# Patient Record
Sex: Female | Born: 1935 | Race: White | Hispanic: No | Smoking: Former smoker
Health system: Southern US, Community
[De-identification: ages and names within clinical notes are randomized; demographics above are authoritative.]

## PROBLEM LIST (undated history)

## (undated) DIAGNOSIS — C50919 Malignant neoplasm of unspecified site of unspecified female breast: Secondary | ICD-10-CM

## (undated) DIAGNOSIS — R51 Headache: Secondary | ICD-10-CM

## (undated) DIAGNOSIS — I10 Essential (primary) hypertension: Secondary | ICD-10-CM

## (undated) DIAGNOSIS — F329 Major depressive disorder, single episode, unspecified: Secondary | ICD-10-CM

## (undated) DIAGNOSIS — F419 Anxiety disorder, unspecified: Secondary | ICD-10-CM

## (undated) DIAGNOSIS — Z853 Personal history of malignant neoplasm of breast: Secondary | ICD-10-CM

## (undated) DIAGNOSIS — Z85038 Personal history of other malignant neoplasm of large intestine: Secondary | ICD-10-CM

## (undated) DIAGNOSIS — E669 Obesity, unspecified: Secondary | ICD-10-CM

## (undated) DIAGNOSIS — N6019 Diffuse cystic mastopathy of unspecified breast: Secondary | ICD-10-CM

## (undated) DIAGNOSIS — C189 Malignant neoplasm of colon, unspecified: Secondary | ICD-10-CM

## (undated) DIAGNOSIS — D490 Neoplasm of unspecified behavior of digestive system: Secondary | ICD-10-CM

## (undated) DIAGNOSIS — M1990 Unspecified osteoarthritis, unspecified site: Secondary | ICD-10-CM

## (undated) DIAGNOSIS — G8929 Other chronic pain: Secondary | ICD-10-CM

## (undated) DIAGNOSIS — E785 Hyperlipidemia, unspecified: Secondary | ICD-10-CM

## (undated) DIAGNOSIS — R519 Other chronic pain: Secondary | ICD-10-CM

## (undated) DIAGNOSIS — G4730 Sleep apnea, unspecified: Secondary | ICD-10-CM

## (undated) DIAGNOSIS — F32A Depression, unspecified: Secondary | ICD-10-CM

## (undated) DIAGNOSIS — I509 Heart failure, unspecified: Secondary | ICD-10-CM

## (undated) DIAGNOSIS — R079 Chest pain, unspecified: Secondary | ICD-10-CM

## (undated) DIAGNOSIS — J449 Chronic obstructive pulmonary disease, unspecified: Secondary | ICD-10-CM

## (undated) HISTORY — DX: Personal history of other malignant neoplasm of large intestine: Z85.038

## (undated) HISTORY — PX: COLOSTOMY: SHX63

## (undated) HISTORY — DX: Malignant neoplasm of colon, unspecified: C18.9

## (undated) HISTORY — DX: Headache: R51

## (undated) HISTORY — PX: APPENDECTOMY: SHX54

## (undated) HISTORY — DX: Malignant neoplasm of unspecified site of unspecified female breast: C50.919

## (undated) HISTORY — DX: Major depressive disorder, single episode, unspecified: F32.9

## (undated) HISTORY — DX: Depression, unspecified: F32.A

## (undated) HISTORY — DX: Other chronic pain: G89.29

## (undated) HISTORY — DX: Hyperlipidemia, unspecified: E78.5

## (undated) HISTORY — DX: Essential (primary) hypertension: I10

## (undated) HISTORY — PX: DILATION AND CURETTAGE, DIAGNOSTIC / THERAPEUTIC: SUR384

## (undated) HISTORY — DX: Unspecified osteoarthritis, unspecified site: M19.90

## (undated) HISTORY — DX: Anxiety disorder, unspecified: F41.9

## (undated) HISTORY — PX: CATARACT EXTRACTION W/ INTRAOCULAR LENS IMPLANT & ANTERIOR VITRECTOMY, BILATERAL: SHX1310

## (undated) HISTORY — DX: Sleep apnea, unspecified: G47.30

## (undated) HISTORY — DX: Chronic obstructive pulmonary disease, unspecified: J44.9

## (undated) HISTORY — DX: Neoplasm of unspecified behavior of digestive system: D49.0

## (undated) HISTORY — DX: Other chronic pain: R51.9

## (undated) HISTORY — PX: OTHER SURGICAL HISTORY: SHX169

## (undated) HISTORY — DX: Chest pain, unspecified: R07.9

## (undated) HISTORY — PX: BUNIONECTOMY: SHX129

## (undated) HISTORY — DX: Personal history of malignant neoplasm of breast: Z85.3

## (undated) HISTORY — PX: MASTECTOMY PARTIAL / LUMPECTOMY: SUR851

## (undated) HISTORY — PX: PARTIAL COLECTOMY: SHX5273

## (undated) HISTORY — DX: Diffuse cystic mastopathy of unspecified breast: N60.19

## (undated) HISTORY — PX: TONSILLECTOMY: SUR1361

## (undated) HISTORY — DX: Obesity, unspecified: E66.9

---

## 1969-01-04 HISTORY — PX: TUBAL LIGATION: SHX77

## 1991-01-05 HISTORY — PX: COLON SURGERY: SHX602

## 2003-11-19 ENCOUNTER — Ambulatory Visit: Payer: Self-pay | Admitting: Gastroenterology

## 2004-09-02 ENCOUNTER — Ambulatory Visit: Payer: Self-pay | Admitting: Chiropractic Medicine

## 2004-12-17 ENCOUNTER — Ambulatory Visit: Payer: Self-pay | Admitting: Family Medicine

## 2004-12-29 ENCOUNTER — Ambulatory Visit: Payer: Self-pay | Admitting: Family Medicine

## 2005-01-04 DIAGNOSIS — C50919 Malignant neoplasm of unspecified site of unspecified female breast: Secondary | ICD-10-CM

## 2005-01-04 HISTORY — PX: BREAST LUMPECTOMY: SHX2

## 2005-01-04 HISTORY — DX: Malignant neoplasm of unspecified site of unspecified female breast: C50.919

## 2005-02-08 ENCOUNTER — Ambulatory Visit: Payer: Self-pay | Admitting: Radiation Oncology

## 2005-02-17 ENCOUNTER — Ambulatory Visit: Payer: Self-pay | Admitting: General Surgery

## 2005-02-17 ENCOUNTER — Other Ambulatory Visit: Payer: Self-pay

## 2005-02-24 ENCOUNTER — Ambulatory Visit: Payer: Self-pay | Admitting: General Surgery

## 2005-03-15 ENCOUNTER — Ambulatory Visit: Payer: Self-pay | Admitting: Oncology

## 2005-04-04 ENCOUNTER — Ambulatory Visit: Payer: Self-pay | Admitting: Oncology

## 2005-05-04 ENCOUNTER — Ambulatory Visit: Payer: Self-pay | Admitting: Oncology

## 2005-06-04 ENCOUNTER — Ambulatory Visit: Payer: Self-pay | Admitting: Oncology

## 2005-09-17 ENCOUNTER — Ambulatory Visit: Payer: Self-pay | Admitting: Oncology

## 2005-11-22 ENCOUNTER — Ambulatory Visit: Payer: Self-pay | Admitting: Radiation Oncology

## 2005-12-13 ENCOUNTER — Ambulatory Visit: Payer: Self-pay | Admitting: General Surgery

## 2006-03-16 ENCOUNTER — Ambulatory Visit: Payer: Self-pay | Admitting: Oncology

## 2006-04-05 ENCOUNTER — Ambulatory Visit: Payer: Self-pay | Admitting: Oncology

## 2006-04-22 ENCOUNTER — Emergency Department: Payer: Self-pay | Admitting: Emergency Medicine

## 2006-06-13 ENCOUNTER — Ambulatory Visit: Payer: Self-pay | Admitting: General Surgery

## 2006-06-20 ENCOUNTER — Ambulatory Visit: Payer: Self-pay | Admitting: Radiation Oncology

## 2006-06-20 ENCOUNTER — Ambulatory Visit: Payer: Self-pay | Admitting: Oncology

## 2006-07-05 ENCOUNTER — Ambulatory Visit: Payer: Self-pay | Admitting: Radiation Oncology

## 2006-10-05 ENCOUNTER — Ambulatory Visit: Payer: Self-pay | Admitting: Oncology

## 2006-10-19 ENCOUNTER — Ambulatory Visit: Payer: Self-pay | Admitting: Oncology

## 2006-11-05 ENCOUNTER — Ambulatory Visit: Payer: Self-pay | Admitting: Oncology

## 2007-01-23 ENCOUNTER — Ambulatory Visit: Payer: Self-pay | Admitting: General Surgery

## 2007-02-03 ENCOUNTER — Ambulatory Visit: Payer: Self-pay | Admitting: General Surgery

## 2007-04-05 ENCOUNTER — Ambulatory Visit: Payer: Self-pay | Admitting: Oncology

## 2007-04-19 ENCOUNTER — Ambulatory Visit: Payer: Self-pay | Admitting: Oncology

## 2007-05-05 ENCOUNTER — Ambulatory Visit: Payer: Self-pay | Admitting: Oncology

## 2007-05-17 ENCOUNTER — Ambulatory Visit: Payer: Self-pay | Admitting: Unknown Physician Specialty

## 2007-06-05 ENCOUNTER — Ambulatory Visit: Payer: Self-pay | Admitting: Radiation Oncology

## 2007-06-07 ENCOUNTER — Ambulatory Visit: Payer: Self-pay | Admitting: Gastroenterology

## 2007-06-16 ENCOUNTER — Other Ambulatory Visit: Payer: Self-pay

## 2007-06-16 ENCOUNTER — Emergency Department: Payer: Self-pay | Admitting: Emergency Medicine

## 2007-07-24 ENCOUNTER — Ambulatory Visit: Payer: Self-pay | Admitting: General Surgery

## 2007-07-27 ENCOUNTER — Ambulatory Visit: Payer: Self-pay | Admitting: General Surgery

## 2007-10-05 ENCOUNTER — Ambulatory Visit: Payer: Self-pay | Admitting: Oncology

## 2007-11-01 ENCOUNTER — Ambulatory Visit: Payer: Self-pay | Admitting: Oncology

## 2007-11-05 ENCOUNTER — Ambulatory Visit: Payer: Self-pay | Admitting: Oncology

## 2008-01-10 ENCOUNTER — Ambulatory Visit: Payer: Self-pay | Admitting: General Surgery

## 2008-09-30 ENCOUNTER — Ambulatory Visit: Payer: Self-pay | Admitting: Unknown Physician Specialty

## 2009-01-04 ENCOUNTER — Ambulatory Visit: Payer: Self-pay | Admitting: Oncology

## 2009-01-21 ENCOUNTER — Ambulatory Visit: Payer: Self-pay | Admitting: Oncology

## 2009-02-04 ENCOUNTER — Ambulatory Visit: Payer: Self-pay | Admitting: Oncology

## 2009-02-11 ENCOUNTER — Ambulatory Visit: Payer: Self-pay | Admitting: Gynecologic Oncology

## 2009-02-12 ENCOUNTER — Ambulatory Visit: Payer: Self-pay | Admitting: General Surgery

## 2009-03-04 ENCOUNTER — Ambulatory Visit: Payer: Self-pay | Admitting: Oncology

## 2009-03-11 ENCOUNTER — Ambulatory Visit: Payer: Self-pay | Admitting: Gynecologic Oncology

## 2009-03-25 ENCOUNTER — Ambulatory Visit: Payer: Self-pay | Admitting: Oncology

## 2009-04-04 ENCOUNTER — Ambulatory Visit: Payer: Self-pay | Admitting: Oncology

## 2009-09-04 ENCOUNTER — Ambulatory Visit: Payer: Self-pay | Admitting: Oncology

## 2009-10-04 ENCOUNTER — Ambulatory Visit: Payer: Self-pay | Admitting: Oncology

## 2010-06-29 ENCOUNTER — Ambulatory Visit: Payer: Self-pay | Admitting: Family Medicine

## 2010-12-10 LAB — PULMONARY FUNCTION TEST

## 2011-01-07 ENCOUNTER — Observation Stay: Payer: Self-pay | Admitting: Internal Medicine

## 2011-01-07 LAB — BASIC METABOLIC PANEL
Anion Gap: 11 (ref 7–16)
BUN: 12 mg/dL (ref 7–18)
Chloride: 97 mmol/L — ABNORMAL LOW (ref 98–107)
EGFR (Non-African Amer.): >60
Glucose: 52 mg/dL — ABNORMAL LOW (ref 65–99)
Osmolality: 275 (ref 275–301)

## 2011-01-07 LAB — CBC
HGB: 12.2 g/dL (ref 12.0–16.0)
MCH: 31.3 pg (ref 26.0–34.0)
MCV: 95 fL (ref 80–100)
Platelet: 244 10*3/uL (ref 150–440)
RBC: 3.9 10*6/uL (ref 3.80–5.20)
WBC: 6.4 10*3/uL (ref 3.6–11.0)

## 2011-01-07 LAB — PRO B NATRIURETIC PEPTIDE: B-Type Natriuretic Peptide: 878 pg/mL — ABNORMAL HIGH (ref 0–125)

## 2011-01-07 LAB — TROPONIN I: Troponin-I: <0.02 ng/mL

## 2011-01-07 LAB — CK TOTAL AND CKMB (NOT AT ARMC)
CK, Total: 52 U/L (ref 21–215)
CK-MB: 0.6 ng/mL (ref 0.5–3.6)

## 2011-01-08 LAB — URINALYSIS, COMPLETE
Bacteria: NONE SEEN
Bilirubin,UR: NEGATIVE
Blood: NEGATIVE
Glucose,UR: NEGATIVE mg/dL (ref 0–75)
Ketone: NEGATIVE
Specific Gravity: 1.005 (ref 1.003–1.030)
Squamous Epithelial: <1
WBC UR: 1 /HPF (ref 0–5)

## 2011-01-08 LAB — BASIC METABOLIC PANEL
Anion Gap: 10 (ref 7–16)
Calcium, Total: 8.9 mg/dL (ref 8.5–10.1)
Chloride: 99 mmol/L (ref 98–107)
Co2: 30 mmol/L (ref 21–32)
Creatinine: 0.64 mg/dL (ref 0.60–1.30)
EGFR (African American): >60
Osmolality: 276 (ref 275–301)
Potassium: 3.9 mmol/L (ref 3.5–5.1)
Sodium: 139 mmol/L (ref 136–145)

## 2011-01-08 LAB — CBC WITH DIFFERENTIAL/PLATELET
Basophil #: 0 10*3/uL (ref 0.0–0.1)
Eosinophil #: 0.1 10*3/uL (ref 0.0–0.7)
HCT: 34.6 % — ABNORMAL LOW (ref 35.0–47.0)
Lymphocyte #: 0.8 10*3/uL — ABNORMAL LOW (ref 1.0–3.6)
Lymphocyte %: 12 %
MCHC: 33.1 g/dL (ref 32.0–36.0)
Monocyte #: 0.7 10*3/uL (ref 0.0–0.7)
Neutrophil #: 5.2 10*3/uL (ref 1.4–6.5)
Neutrophil %: 75.8 %
Platelet: 213 10*3/uL (ref 150–440)
RDW: 14.1 % (ref 11.5–14.5)

## 2011-01-08 LAB — LIPID PANEL
Cholesterol: 132 mg/dL (ref 0–200)
HDL Cholesterol: 54 mg/dL (ref 40–60)
Ldl Cholesterol, Calc: 61 mg/dL (ref 0–100)
Triglycerides: 85 mg/dL (ref 0–200)
VLDL Cholesterol, Calc: 17 mg/dL (ref 5–40)

## 2011-01-08 LAB — OCCULT BLOOD X 1 CARD TO LAB, STOOL: Occult Blood, Feces: POSITIVE

## 2011-01-08 LAB — CK TOTAL AND CKMB (NOT AT ARMC)
CK, Total: 51 U/L (ref 21–215)
CK, Total: 64 U/L (ref 21–215)
CK-MB: 0.9 ng/mL (ref 0.5–3.6)

## 2011-01-08 LAB — TROPONIN I: Troponin-I: <0.02 ng/mL

## 2011-01-08 LAB — HEMOGLOBIN A1C: Hemoglobin A1C: 6.4 % — ABNORMAL HIGH (ref 4.2–6.3)

## 2011-01-13 LAB — CULTURE, BLOOD (SINGLE)

## 2011-03-10 ENCOUNTER — Encounter: Payer: Self-pay | Admitting: Pulmonary Disease

## 2011-03-10 ENCOUNTER — Ambulatory Visit (INDEPENDENT_AMBULATORY_CARE_PROVIDER_SITE_OTHER): Payer: 59 | Admitting: Pulmonary Disease

## 2011-03-10 DIAGNOSIS — J9611 Chronic respiratory failure with hypoxia: Secondary | ICD-10-CM

## 2011-03-10 DIAGNOSIS — J9610 Chronic respiratory failure, unspecified whether with hypoxia or hypercapnia: Secondary | ICD-10-CM

## 2011-03-10 DIAGNOSIS — E785 Hyperlipidemia, unspecified: Secondary | ICD-10-CM

## 2011-03-10 DIAGNOSIS — I10 Essential (primary) hypertension: Secondary | ICD-10-CM

## 2011-03-10 DIAGNOSIS — I110 Hypertensive heart disease with heart failure: Secondary | ICD-10-CM | POA: Insufficient documentation

## 2011-03-10 DIAGNOSIS — J449 Chronic obstructive pulmonary disease, unspecified: Secondary | ICD-10-CM

## 2011-03-10 DIAGNOSIS — E119 Type 2 diabetes mellitus without complications: Secondary | ICD-10-CM

## 2011-03-10 DIAGNOSIS — E1140 Type 2 diabetes mellitus with diabetic neuropathy, unspecified: Secondary | ICD-10-CM | POA: Insufficient documentation

## 2011-03-10 MED ORDER — PREDNISONE 10 MG PO TABS: ORAL_TABLET | ORAL | Status: DC

## 2011-03-10 MED ORDER — DOXYCYCLINE HYCLATE 100 MG PO TABS: 100.0000 mg | ORAL_TABLET | Freq: Two times a day (BID) | ORAL | Status: AC

## 2011-03-10 NOTE — Assessment & Plan Note (Signed)
Continue using 3 L O2 at rest and 6 L with exertion.   Importance of this was stressed in clinic today.

## 2011-03-10 NOTE — Progress Notes (Signed)
Subjective:    Patient ID: Kathryn Hayes, female    DOB: Oct 04, 1934, 77 y.o.   MRN: [id number]  HPI  This is a pleasant 77 y/o female with COPD who was diagnosed by Dr. Terance Hart 5 years ago.  She states that in October 2012 her shortness of breath and chest heaviness increased so she quit smoking.  Her symptoms progressed however and she was seen by Dr. Mayo Ao for COPD.  She was placed on inhalers but unfortunately she had a severe COPD flare and was hospitalized at [facility] for the same.  She was treated with prednisone and recovered well. Since then however she notes that she still has significant dyspnea on exertion.  She cannot walk into the grocery store from the parking lot without stopping. She can get around in her house OK as [place] as she uses oxygen.  She often does not use her oxygen.  She is not sure about how to use her inhalers.  She feels that her GERD is well controlled with her reflux meds.  She wheezes often but it is always improved with albuterol.   She doesn't often have sputum production but in the last few weeks she has had increasing sputum production, sinus congestion, cough, wheezing and dyspnea.  No fevers or chills.   Past Medical History  Diagnosis Date  . Diabetes mellitus   . Hyperlipidemia   . Hypertension   . Chronic headache   . Breast cancer   . COPD (chronic obstructive pulmonary disease)      Family History  Problem Relation Age of Onset  . Factor V Leiden deficiency Sister   . Breast cancer Mother   . Colon cancer Mother      History   Social History  . Marital Status: Divorced    Spouse Name: N/A    Number of Children: N/A  . Years of Education: N/A   Occupational History  . Not on file.   Social History Main Topics  . Smoking status: Former Smoker -- 1.5 packs/day for 50 years    Types: Cigarettes    Quit date: 10/05/2010  . Smokeless tobacco: Never Used  . Alcohol Use: 1.0 oz/week    2 drink(s) per week  . Drug Use: Not on file  .  Sexually Active: Not on file   Other Topics Concern  . Not on file   Social History Narrative  . No narrative on file     No Known Allergies   No outpatient prescriptions prior to visit.      Review of Systems  Constitutional: Negative for fever, chills and unexpected weight change.  HENT: Positive for sneezing and dental problem. Negative for ear pain, nosebleeds, congestion, sore throat, rhinorrhea, trouble swallowing, voice change, postnasal drip and sinus pressure.   Eyes: Negative for visual disturbance.  Respiratory: Positive for cough and shortness of breath. Negative for choking.   Cardiovascular: Positive for leg swelling. Negative for chest pain.  Gastrointestinal: Positive for abdominal pain. Negative for vomiting and diarrhea.  Genitourinary: Negative for difficulty urinating.  Musculoskeletal: Positive for arthralgias.  Skin: Negative for rash.  Neurological: Positive for headaches. Negative for tremors and syncope.  Hematological: Does not bruise/bleed easily.       Objective:   Physical Exam Filed Vitals:   03/10/11 1644  BP: 118/74  Pulse: 90  Temp: 97.8 F (36.6 C)  TempSrc: Oral  Height: 5\' 1"  (1.549 m)  Weight: 241 lb (109.317 kg)  SpO2: 92%   Gen: chronically  ill appearing, obese white female in no acute distress HEENT: NCAT, PERRL, EOMi, OP clear, neck supple without masses PULM: Poor air movement, scattered insp crackles in bases, no accessory muscle use CV: RRR, distant and difficult to hear heart sounds, slight systolic murmur?, cannot assess JVD AB: BS+, soft, nontender, no hsm Ext: warm, trace edema, no clubbing, no cyanosis Derm: no rash or skin breakdown Neuro: A&Ox4, CN II-XII intact, strength 5/5 in all 4 extremities  Review of January 2013 CXR : hyperinflation, increased pulmonary vascularity  CT Chest January 2013: no pe, no clear infiltrate or significant emphysema      Assessment & Plan:   COPD (chronic obstructive  pulmonary disease) COPD, grade of severity undetermined due to lack of spirometry data.  I suspect that she has severe disease.  There is no compelling reason to change her therapy today, but after I have had the opportunity to review her records I will consider starting roflumilast.  She and I agree that her inhaler technique is probably not optimal so we went over that in length today.  She seems to be in a mild flare now as she has been having increasing dyspnea, sputum production and wheezing in the last few weeks. We will give her a short course of doxcycline and prednisone for this.  In addition to her COPD, I think quite a bit of her shortness of breath is from deconditioning and obesity.  We will refer to pulmonary rehab for this.  Hypoxemic respiratory failure, chronic Continue using 3 L O2 at rest and 6 L with exertion.   Importance of this was stressed in clinic today.    Updated Medication List Outpatient Encounter Prescriptions as of 03/10/2011  Medication Sig Dispense Refill  . albuterol (PROVENTIL) (2.5 MG/3ML) 0.083% nebulizer solution Take 2.5 mg by nebulization every 4 (four) hours as needed.      Marland Kitchen amLODipine (NORVASC) 10 MG tablet Take 10 mg by mouth daily.      Marland Kitchen aspirin 81 MG tablet Take 81 mg by mouth daily.      . DULoxetine (CYMBALTA) 60 MG capsule Take 60 mg by mouth daily.      Marland Kitchen escitalopram (LEXAPRO) 20 MG tablet Take 20 mg by mouth daily.      . Fluticasone-Salmeterol (ADVAIR DISKUS) 250-50 MCG/DOSE AEPB Inhale 1 puff into the lungs every 12 (twelve) hours.      Marland Kitchen glimepiride (AMARYL) 4 MG tablet Take 4 mg by mouth daily.      . hydrochlorothiazide (HYDRODIURIL) 25 MG tablet Take 25 mg by mouth daily.      Marland Kitchen HYDROcodone-acetaminophen (VICODIN) 5-500 MG per tablet Take 1 tablet by mouth every 6 (six) hours as needed.      Marland Kitchen LORazepam (ATIVAN) 1 MG tablet Take 1 mg by mouth 3 (three) times daily as needed.      . metFORMIN (GLUCOPHAGE-XR) 750 MG 24 hr tablet Take  750 mg by mouth 3 (three) times daily.      . pantoprazole (PROTONIX) 40 MG tablet Take 40 mg by mouth daily.      . pioglitazone (ACTOS) 30 MG tablet Take 30 mg by mouth daily.      . pravastatin (PRAVACHOL) 40 MG tablet Take 40 mg by mouth daily.      Marland Kitchen tiotropium (SPIRIVA) 18 MCG inhalation capsule Place 18 mcg into inhaler and inhale daily.      . traMADol (ULTRAM) 50 MG tablet Take 50 mg by mouth 4 (four) times daily  as needed.      . trandolapril (MAVIK) 4 MG tablet Take 4 mg by mouth daily.      Marland Kitchen doxycycline (VIBRA-TABS) 100 MG tablet Take 1 tablet (100 mg total) by mouth 2 (two) times daily.  14 tablet  0  . predniSONE (DELTASONE) 10 MG tablet Take 40mg  po daily for 3 days, then take 30mg  po daily for 3 days, then take 20mg  po daily for two days, then take 10mg  po daily for 2 days  30 tablet  0

## 2011-03-10 NOTE — Patient Instructions (Addendum)
Continue taking your inhalers as written. Take the prednisone taper as written Take the doxycycline as written Call us if you are not getting better.  Use Neil Med rinses with distilled water at least twice per day using the instructions on the package. 1/2 hour after using the [facility] Med rinse, use Nasonex two puffs in each nostril once per day. Use chlortrimeton and an over the counter decongestant (ask the pharmacist for a recommendation) as needed for the cough. You can also use delsym as needed for cough.  We will make a referral for you with pulmonary rehab  We will get Dr. Lillette Boxer records  We will see you back in 1-2 months.

## 2011-03-10 NOTE — Assessment & Plan Note (Addendum)
COPD, grade of severity undetermined due to lack of spirometry data.  I suspect that she has severe disease.  There is no compelling reason to change her therapy today, but after I have had the opportunity to review her records I will consider starting roflumilast.  She and I agree that her inhaler technique is probably not optimal so we went over that in length today.  She seems to be in a mild flare now as she has been having increasing dyspnea, sputum production and wheezing in the last few weeks. We will give her a short course of doxcycline and prednisone for this.  In addition to her COPD, I think quite a bit of her shortness of breath is from deconditioning and obesity.  We will refer to pulmonary rehab for this.

## 2011-03-23 ENCOUNTER — Telehealth: Payer: Self-pay | Admitting: Pulmonary Disease

## 2011-03-23 DIAGNOSIS — J449 Chronic obstructive pulmonary disease, unspecified: Secondary | ICD-10-CM

## 2011-03-23 NOTE — Telephone Encounter (Signed)
Pt was seen at our [place] clinic on 03/10/11.  Prior to coming to see [place] she signed release at Dr. Reita Cliche office to have records, including PFT's sent to [place]. We still have not received anything, and we need to PFT's so we can refer the pt to pulmonary rehab at [facility].  I called and spoke with Albin Felling, with HIM dept at Dr. Reita Cliche office and she advised that she will go ahead and fax [place] the PFT's today, and the rest should come by mail. Will hold in my basket until received.

## 2011-03-23 NOTE — Telephone Encounter (Signed)
Received PFT from Dr. Reita Cliche office and faxed it along with rehab referral to [facility].

## 2011-04-21 ENCOUNTER — Ambulatory Visit (INDEPENDENT_AMBULATORY_CARE_PROVIDER_SITE_OTHER): Payer: 59 | Admitting: Pulmonary Disease

## 2011-04-21 ENCOUNTER — Other Ambulatory Visit: Payer: Self-pay | Admitting: Pulmonary Disease

## 2011-04-21 DIAGNOSIS — J449 Chronic obstructive pulmonary disease, unspecified: Secondary | ICD-10-CM

## 2011-04-21 MED ORDER — TIOTROPIUM BROMIDE MONOHYDRATE 18 MCG IN CAPS: 18.0000 ug | ORAL_CAPSULE | Freq: Every day | RESPIRATORY_TRACT | Status: DC

## 2011-04-21 NOTE — Progress Notes (Deleted)
Subjective:    Patient ID: Kathryn Hayes, female    DOB: 09/26/1934, 77 y.o.   MRN: [id number]  Synopsis: Kathryn Hayes was first referred to the [facility] [place] clinic in 03/2011 for COPD.  PFT's performed in 12/12 showed a F/F ratio of 68%, FEV1 1.26L (68% pred) TLC  4.47L (118% pred), RV 2.64L (69% pred), DLCO 41% pred.  At the 03/2011 visit she was on 3L O2 at rest and 6L O2 with exertion. She was referred to pulmonary rehab.  HPI 04/21/11 ROV -- last visit consider roflumilast, given steroids and doxy for a flare, referred to pulm rehab;   Past Medical History  Diagnosis Date  . Diabetes mellitus   . Hyperlipidemia   . Hypertension   . Chronic headache   . Breast cancer   . COPD (chronic obstructive pulmonary disease)       Review of Systems  Constitutional: Negative for fever, chills and unexpected weight change.  HENT: Positive for sneezing and dental problem. Negative for ear pain, nosebleeds, congestion, sore throat, rhinorrhea, trouble swallowing, voice change, postnasal drip and sinus pressure.   Eyes: Negative for visual disturbance.  Respiratory: Positive for cough and shortness of breath. Negative for choking.   Cardiovascular: Positive for leg swelling. Negative for chest pain.  Gastrointestinal: Positive for abdominal pain. Negative for vomiting and diarrhea.  Genitourinary: Negative for difficulty urinating.  Musculoskeletal: Positive for arthralgias.  Skin: Negative for rash.  Neurological: Positive for headaches. Negative for tremors and syncope.  Hematological: Does not bruise/bleed easily.       Objective:   Physical Exam  There were no vitals filed for this visit. Gen: chronically ill appearing, obese white female in no acute distress HEENT: NCAT, PERRL, EOMi, OP clear, neck supple without masses PULM: Poor air movement, scattered insp crackles in bases, no accessory muscle use CV: RRR, distant and difficult to hear heart sounds, slight systolic  murmur?, cannot assess JVD AB: BS+, soft, nontender, no hsm Ext: warm, trace edema, no clubbing, no cyanosis Derm: no rash or skin breakdown Neuro: A&Ox4, CN II-XII intact, strength 5/5 in all 4 extremities  Review of January 2013 CXR : hyperinflation, increased pulmonary vascularity  CT Chest January 2013: no pe, no clear infiltrate or significant emphysema      Assessment & Plan:   No problem-specific assessment & plan notes found for this encounter.   Updated Medication List Outpatient Encounter Prescriptions as of 04/21/2011  Medication Sig Dispense Refill  . albuterol (PROVENTIL) (2.5 MG/3ML) 0.083% nebulizer solution Take 2.5 mg by nebulization every 4 (four) hours as needed.      Marland Kitchen amLODipine (NORVASC) 10 MG tablet Take 10 mg by mouth daily.      Marland Kitchen aspirin 81 MG tablet Take 81 mg by mouth daily.      . DULoxetine (CYMBALTA) 60 MG capsule Take 60 mg by mouth daily.      Marland Kitchen escitalopram (LEXAPRO) 20 MG tablet Take 20 mg by mouth daily.      . Fluticasone-Salmeterol (ADVAIR DISKUS) 250-50 MCG/DOSE AEPB Inhale 1 puff into the lungs every 12 (twelve) hours.      Marland Kitchen glimepiride (AMARYL) 4 MG tablet Take 4 mg by mouth daily.      . hydrochlorothiazide (HYDRODIURIL) 25 MG tablet Take 25 mg by mouth daily.      Marland Kitchen HYDROcodone-acetaminophen (VICODIN) 5-500 MG per tablet Take 1 tablet by mouth every 6 (six) hours as needed.      Marland Kitchen LORazepam (ATIVAN) 1 MG  tablet Take 1 mg by mouth 3 (three) times daily as needed.      . metFORMIN (GLUCOPHAGE-XR) 750 MG 24 hr tablet Take 750 mg by mouth 3 (three) times daily.      . pantoprazole (PROTONIX) 40 MG tablet Take 40 mg by mouth daily.      . pioglitazone (ACTOS) 30 MG tablet Take 30 mg by mouth daily.      . pravastatin (PRAVACHOL) 40 MG tablet Take 40 mg by mouth daily.      . predniSONE (DELTASONE) 10 MG tablet Take 40mg  po daily for 3 days, then take 30mg  po daily for 3 days, then take 20mg  po daily for two days, then take 10mg  po daily for 2  days  30 tablet  0  . tiotropium (SPIRIVA) 18 MCG inhalation capsule Place 18 mcg into inhaler and inhale daily.      . traMADol (ULTRAM) 50 MG tablet Take 50 mg by mouth 4 (four) times daily as needed.      . trandolapril (MAVIK) 4 MG tablet Take 4 mg by mouth daily.

## 2011-04-21 NOTE — Telephone Encounter (Signed)
Called spoke with patient who stated that she had her cancel her appt with [facility] this afternoon d/t a fall last night in her home and soreness today.  Did not want to reschedule at this time.  Pt is requesting refills on her spiriva, "only have 1 pill left for the spiriva."  Pt stated that she has plenty of advair and albuterol neb soln.  Rx sent to verified pharmacy.  Advised pt to contact her pharmacy when she refills on her advair and/or albuterol and to call our office back to schedule follow up.  Pt verbalized her understanding.

## 2011-04-21 NOTE — Telephone Encounter (Signed)
LMTCBx1 with the pt. According to med list the pt is on advair and spiriva, and albuterol neb?? Carron Curie, CMA

## 2011-04-22 NOTE — Progress Notes (Signed)
No show

## 2011-05-20 ENCOUNTER — Ambulatory Visit (INDEPENDENT_AMBULATORY_CARE_PROVIDER_SITE_OTHER): Payer: 59 | Admitting: Pulmonary Disease

## 2011-05-20 ENCOUNTER — Encounter: Payer: Self-pay | Admitting: Pulmonary Disease

## 2011-05-20 VITALS — BP 140/62 | HR 55 | Temp 97.7°F | Ht 61.0 in | Wt 264.1 lb

## 2011-05-20 DIAGNOSIS — I509 Heart failure, unspecified: Secondary | ICD-10-CM | POA: Insufficient documentation

## 2011-05-20 DIAGNOSIS — E8770 Fluid overload, unspecified: Secondary | ICD-10-CM

## 2011-05-20 DIAGNOSIS — E8779 Other fluid overload: Secondary | ICD-10-CM

## 2011-05-20 DIAGNOSIS — J449 Chronic obstructive pulmonary disease, unspecified: Secondary | ICD-10-CM

## 2011-05-20 MED ORDER — FUROSEMIDE 40 MG PO TABS: 40.0000 mg | ORAL_TABLET | Freq: Every day | ORAL | Status: DC

## 2011-05-20 MED ORDER — POTASSIUM CHLORIDE ER 10 MEQ PO TBCR: 20.0000 meq | EXTENDED_RELEASE_TABLET | Freq: Every day | ORAL | Status: DC

## 2011-05-20 NOTE — Assessment & Plan Note (Signed)
Kathryn Hayes COPD appears to be stable during the last six months since I have seen her, and I see no indication to change her inhaler regimen.  In general she needs to lose weight and exercise more, but currently she appears to be volume overloaded so we will need to address this prior to encouraging exercise.  See volume overload below.

## 2011-05-20 NOTE — Assessment & Plan Note (Signed)
On exam today in the office Kathryn Hayes is clearly volume overloaded.  Ddx includes CHF (new diagnosis) vs. Renal failure vs. Less likely hypoalbuminemia.  I suspect this is diastolic heart failure.  She has been symptomatic and needs treatment, but is not in marked respiratory distress.  She would like to avoid a hospitalization so we will start diuretic therapy today and obtain a CXR and labwork.  Plan: -obtain recent TTE results from [facility] -start lasix 40mg  with 20 meq KCL bid for three days then daily until next week -follow up with PCP next week for further lasix adjustment -may need cardiology eval -I explained to Kathryn Hayes and her daughter that if she has no improvement or is worse she needs to seek immediate medical treatment

## 2011-05-20 NOTE — Patient Instructions (Signed)
Continue taking all your inhalers as you are doing. Start taking furosemide 40mg  by mouth twice a day for three days (8AM and 2PM), then daily at 8AM until you see your primary care doctor next week Take the potassium chloride (Klor-con) with lasix We will have you get a chest x-ray at the [facility] office tomorrow morning. Increase your oxygen to 4L/min with walking If your shortness of breath or chest pain get worse you need to seek care at an emergency room  We will see you back in 3-4 months for your COPD or sooner if you are worse.

## 2011-05-20 NOTE — Progress Notes (Signed)
Synopsis: Kathryn Hayes was diagnosed with COPD in 2007 and came to the [facility] clinic for the first time in 2013.  She has been hospitalized for a COPD flare in the past.  He uses 2 L O2 continuously.  12/2010 Full PFT [facility]: Ratio 70%, FEV1 1.2 L (65%) clear obstruction on flow volume loop, TLC normal, RV 169% DLCO 41%    HPI   05/20/11 ROV -- Kathryn Hayes notes lots of shortness of breath in the last few days and in fact she is short of breath after just a few steps.  She notes rare chest pain sometimes described as substernal tightness associated with shortness of breath.  She does not have cough, fever, chills, or sputum production.  She does have some sinus syptoms, her eyes are watery and itchy, and she does have some wheezing.  She notes increased swelling in her legs.  She can lay flat while using her home CPAP machine.  Apparently a new blood pressure med has been added recently and she notes that her BP had been elevated at home previously (as high as 200 systolic).  She notes 20 lbs weight gain and swelling since December.  She drinks a lot of water.    Past Medical History  Diagnosis Date  . Diabetes mellitus   . Hyperlipidemia   . Hypertension   . Chronic headache   . Breast cancer   . COPD (chronic obstructive pulmonary disease)     Review of Systems  Constitutional: Positive for fatigue and unexpected weight change. Negative for fever and chills.  HENT: Positive for congestion, rhinorrhea, sneezing and postnasal drip. Negative for nosebleeds.   Respiratory: Positive for chest tightness and wheezing. Negative for cough, choking and shortness of breath.   Cardiovascular: Positive for chest pain and leg swelling. Negative for palpitations.      Objective:   Physical Exam  Filed Vitals:   05/20/11 1513  BP: 140/62  Pulse: 55  Temp: 97.7 F (36.5 C)  TempSrc: Oral  Height: 5\' 1"  (1.549 m)  Weight: 264 lb 1.9 oz (119.804 kg)  SpO2: 95%  3 L nasal cannula  Gen:  chronically ill appearing, obese white female in no acute distress HEENT: NCAT, PERRL, EOMi, OP clear, neck supple without masses PULM: good air movement, notable insp crackles in bases R > L, no accessory muscle use CV: RRR, distant and difficult to hear heart sounds, cannot assess JVD AB: BS+, soft, nontender, no hsm Ext: warm, significant bilateral pitting leg edema   Review of January 2013 CXR : hyperinflation, increased pulmonary vascularity  CT Chest January 2013: no pe, no clear infiltrate or significant emphysema  12/2010 Full PFT [facility]: Ratio 70%, FEV1 1.2 L (65%) clear obstruction on flow volume loop, TLC normal, RV 169% DLCO 41%  Assessment & Plan:   COPD (chronic obstructive pulmonary disease) Kathryn Hayes COPD appears to be stable during the last six months since I have seen her, and I see no indication to change her inhaler regimen.  In general she needs to lose weight and exercise more, but currently she appears to be volume overloaded so we will need to address this prior to encouraging exercise.  See volume overload below.  Volume overload On exam today in the office Kathryn Hayes is clearly volume overloaded.  Ddx includes CHF (new diagnosis) vs. Renal failure vs. Less likely hypoalbuminemia.  I suspect this is diastolic heart failure.  She has been symptomatic and needs treatment, but is not in  marked respiratory distress.  She would like to avoid a hospitalization so we will start diuretic therapy today and obtain a CXR and labwork.  Plan: -obtain recent TTE results from [facility] -start lasix 40mg  with 20 meq KCL bid for three days then daily until next week -follow up with PCP next week for further lasix adjustment -may need cardiology eval -I explained to Kathryn Hayes and her daughter that if she has no improvement or is worse she needs to seek immediate medical treatment     Updated Medication List Outpatient Encounter Prescriptions as of 05/20/2011  Medication Sig  Dispense Refill  . albuterol (PROVENTIL) (2.5 MG/3ML) 0.083% nebulizer solution Take 2.5 mg by nebulization every 4 (four) hours as needed.      Marland Kitchen amLODipine (NORVASC) 10 MG tablet Take 10 mg by mouth daily.      Marland Kitchen aspirin 81 MG tablet Take 81 mg by mouth daily.      . DULoxetine (CYMBALTA) 60 MG capsule Take 60 mg by mouth daily.      Marland Kitchen escitalopram (LEXAPRO) 20 MG tablet Take 20 mg by mouth daily.      . Fluticasone-Salmeterol (ADVAIR DISKUS) 250-50 MCG/DOSE AEPB Inhale 1 puff into the lungs every 12 (twelve) hours.      Marland Kitchen glimepiride (AMARYL) 4 MG tablet Take 4 mg by mouth daily.      . hydrochlorothiazide (HYDRODIURIL) 25 MG tablet Take 25 mg by mouth daily.      Marland Kitchen HYDROcodone-acetaminophen (VICODIN) 5-500 MG per tablet Take 1 tablet by mouth every 6 (six) hours as needed.      Marland Kitchen LORazepam (ATIVAN) 1 MG tablet Take 1 mg by mouth 3 (three) times daily as needed.      . metFORMIN (GLUCOPHAGE-XR) 750 MG 24 hr tablet Take 750 mg by mouth 3 (three) times daily.      . pantoprazole (PROTONIX) 40 MG tablet Take 40 mg by mouth daily.      . pioglitazone (ACTOS) 30 MG tablet Take 30 mg by mouth daily.      . pravastatin (PRAVACHOL) 40 MG tablet Take 40 mg by mouth daily.      . predniSONE (DELTASONE) 10 MG tablet Take 40mg  po daily for 3 days, then take 30mg  po daily for 3 days, then take 20mg  po daily for two days, then take 10mg  po daily for 2 days  30 tablet  0  . tiotropium (SPIRIVA) 18 MCG inhalation capsule Place 1 capsule (18 mcg total) into inhaler and inhale daily.  30 capsule  5  . traMADol (ULTRAM) 50 MG tablet Take 50 mg by mouth 4 (four) times daily as needed.      . trandolapril (MAVIK) 4 MG tablet Take 4 mg by mouth daily.

## 2011-05-21 ENCOUNTER — Telehealth: Payer: Self-pay | Admitting: Pulmonary Disease

## 2011-05-21 LAB — BASIC METABOLIC PANEL
CO2: 27 mEq/L (ref 19–32)
Calcium: 9.8 mg/dL (ref 8.4–10.5)
Creat: 0.85 mg/dL (ref 0.50–1.10)
Glucose, Bld: 206 mg/dL — ABNORMAL HIGH (ref 70–99)
Sodium: 140 mEq/L (ref 135–145)

## 2011-05-21 NOTE — Telephone Encounter (Signed)
Will forward to Dr Henrene Pastor and Verlon Au as Lorain Childes.

## 2011-05-21 NOTE — Telephone Encounter (Signed)
Please call and make sure that she has the lasix and potassium that I prescribed yesterday and make sure that she is diuresing well.  Remind her that she needs to seek ER care today or at any point over the weekend if she is not improving or if she is feeling worse.  Thanks, Kipp Brood

## 2011-05-21 NOTE — Progress Notes (Signed)
Addended by: Jobie Quaker on: 05/21/2011 04:31 PM   Modules accepted: Orders

## 2011-05-21 NOTE — Telephone Encounter (Signed)
Spoke with patients daughter- she is aware of recs from Dr Kendrick Fries; will have PCP send CXR results once done on Monday 05-24-11. Also, daughter is requesting to know if you have gotten ECHO results as of today. Thanks.

## 2011-05-21 NOTE — Telephone Encounter (Signed)
LMTCB

## 2011-05-22 ENCOUNTER — Inpatient Hospital Stay: Payer: Self-pay | Admitting: Internal Medicine

## 2011-05-22 ENCOUNTER — Telehealth: Payer: Self-pay | Admitting: Pulmonary Disease

## 2011-05-22 DIAGNOSIS — I509 Heart failure, unspecified: Secondary | ICD-10-CM

## 2011-05-22 LAB — URINALYSIS, COMPLETE
Blood: NEGATIVE
Glucose,UR: NEGATIVE mg/dL (ref 0–75)
Ketone: NEGATIVE
Nitrite: NEGATIVE
Ph: 7 (ref 4.5–8.0)
RBC,UR: NONE SEEN /HPF (ref 0–5)
Squamous Epithelial: <1
WBC UR: 2 /HPF (ref 0–5)

## 2011-05-22 LAB — COMPREHENSIVE METABOLIC PANEL
Albumin: 3.6 g/dL (ref 3.4–5.0)
Alkaline Phosphatase: 43 U/L — ABNORMAL LOW (ref 50–136)
Anion Gap: 8 (ref 7–16)
BUN: 11 mg/dL (ref 7–18)
Bilirubin,Total: 0.4 mg/dL (ref 0.2–1.0)
Calcium, Total: 8.7 mg/dL (ref 8.5–10.1)
Co2: 32 mmol/L (ref 21–32)
Creatinine: 0.76 mg/dL (ref 0.60–1.30)
EGFR (African American): >60
Glucose: 178 mg/dL — ABNORMAL HIGH (ref 65–99)
Osmolality: 279 (ref 275–301)
Potassium: 4.3 mmol/L (ref 3.5–5.1)
SGOT(AST): 20 U/L (ref 15–37)

## 2011-05-22 LAB — CK TOTAL AND CKMB (NOT AT ARMC)
CK, Total: 73 U/L (ref 21–215)
CK-MB: 1 ng/mL (ref 0.5–3.6)
CK-MB: 0.8 ng/mL (ref 0.5–3.6)

## 2011-05-22 LAB — CBC WITH DIFFERENTIAL/PLATELET
Eosinophil #: 0.3 10*3/uL (ref 0.0–0.7)
Eosinophil %: 5.9 %
HCT: 37.6 % (ref 35.0–47.0)
Lymphocyte #: 0.8 10*3/uL — ABNORMAL LOW (ref 1.0–3.6)
Monocyte #: 0.5 x10 3/mm (ref 0.2–0.9)
Monocyte %: 9.8 %
Neutrophil #: 3.3 10*3/uL (ref 1.4–6.5)
Neutrophil %: 66.8 %
Platelet: 190 10*3/uL (ref 150–440)
RDW: 14 % (ref 11.5–14.5)
WBC: 5 10*3/uL (ref 3.6–11.0)

## 2011-05-22 LAB — PRO B NATRIURETIC PEPTIDE: B-Type Natriuretic Peptide: 670 pg/mL — ABNORMAL HIGH (ref 0–450)

## 2011-05-22 NOTE — Telephone Encounter (Signed)
I called Kathryn Hayes this morning to check on her.  She said that she had been taking the lasix and the potassium, but it sounds like she has only taking two doses of each.  She said that she had a hard time breathing last night before she went to bed but she thinks that she is a little better this morning and that her swelling is better.  I told her based on the potassium level from her bloodwork yesterday that she should not take any more afternoon potassium until she has her blood checked on Monday, but she should continue taking the lasix. I explained to her that she should go to the emergency room if she gets worse before Monday.  She voiced understanding. I also tried to call her daughter but had to leave a detailed message.

## 2011-05-23 LAB — BASIC METABOLIC PANEL
Anion Gap: 9 (ref 7–16)
BUN: 22 mg/dL — ABNORMAL HIGH (ref 7–18)
Calcium, Total: 8.5 mg/dL (ref 8.5–10.1)
Chloride: 91 mmol/L — ABNORMAL LOW (ref 98–107)
Co2: 35 mmol/L — ABNORMAL HIGH (ref 21–32)
Osmolality: 278 (ref 275–301)

## 2011-05-23 LAB — HEMOGLOBIN A1C: Hemoglobin A1C: 5.9 % (ref 4.2–6.3)

## 2011-05-23 LAB — BRAIN NATRIURETIC PEPTIDE: Brain Natriuretic Peptide: 225.1 pg/mL — ABNORMAL HIGH (ref 0.0–100.0)

## 2011-05-23 LAB — LIPID PANEL
HDL Cholesterol: 79 mg/dL — ABNORMAL HIGH (ref 40–60)
Ldl Cholesterol, Calc: 79 mg/dL (ref 0–100)
VLDL Cholesterol, Calc: 14 mg/dL (ref 5–40)

## 2011-05-23 LAB — CK TOTAL AND CKMB (NOT AT ARMC): CK-MB: 0.7 ng/mL (ref 0.5–3.6)

## 2011-05-23 LAB — TSH: Thyroid Stimulating Horm: 0.749 u[IU]/mL

## 2011-05-24 LAB — BASIC METABOLIC PANEL
BUN: 24 mg/dL — ABNORMAL HIGH (ref 7–18)
Calcium, Total: 8.7 mg/dL (ref 8.5–10.1)
Chloride: 94 mmol/L — ABNORMAL LOW (ref 98–107)
Co2: 38 mmol/L — ABNORMAL HIGH (ref 21–32)
EGFR (African American): >60
Glucose: 107 mg/dL — ABNORMAL HIGH (ref 65–99)
Osmolality: 280 (ref 275–301)
Potassium: 3.9 mmol/L (ref 3.5–5.1)
Sodium: 138 mmol/L (ref 136–145)

## 2011-06-02 NOTE — Telephone Encounter (Signed)
See phone note from 05-22-11. Dr. Henrene Pastor spoke with the pt.Kathryn Hayes, CMA

## 2012-01-05 DIAGNOSIS — K2270 Barrett's esophagus without dysplasia: Secondary | ICD-10-CM | POA: Insufficient documentation

## 2012-01-13 ENCOUNTER — Ambulatory Visit: Payer: Self-pay | Admitting: Gastroenterology

## 2012-01-14 LAB — PATHOLOGY REPORT

## 2012-10-04 ENCOUNTER — Encounter: Payer: Self-pay | Admitting: Pulmonary Disease

## 2012-10-10 ENCOUNTER — Ambulatory Visit: Payer: 59 | Admitting: Pulmonary Disease

## 2012-11-14 ENCOUNTER — Telehealth: Payer: Self-pay | Admitting: Pulmonary Disease

## 2012-11-14 NOTE — Telephone Encounter (Signed)
I spoke with Marylu Lund. She reports pt is going to have an upper endoscopy by Dr. Lutricia Feil. This is not scheduled yet. Pt last seen 05/2011. Please advise Dr. Kendrick Fries thanks

## 2012-11-14 NOTE — Telephone Encounter (Signed)
Since I haven't seen her in over 18 months I can't clear her.  She needs to schedule an appointment with me, Tammy, or one of the other docs depending on the most convenient schedule.

## 2012-11-15 NOTE — Telephone Encounter (Signed)
I spoke with Marylu Lund. Appt scheduled for pt. She will call pt.

## 2012-11-24 ENCOUNTER — Ambulatory Visit: Payer: Self-pay | Admitting: Gastroenterology

## 2012-12-05 ENCOUNTER — Encounter: Payer: Self-pay | Admitting: Pulmonary Disease

## 2012-12-05 ENCOUNTER — Ambulatory Visit (INDEPENDENT_AMBULATORY_CARE_PROVIDER_SITE_OTHER): Payer: 59 | Admitting: Pulmonary Disease

## 2012-12-05 ENCOUNTER — Encounter (INDEPENDENT_AMBULATORY_CARE_PROVIDER_SITE_OTHER): Payer: Self-pay

## 2012-12-05 VITALS — BP 126/78 | HR 107 | Temp 97.9°F | Ht 61.0 in | Wt 246.0 lb

## 2012-12-05 DIAGNOSIS — Z23 Encounter for immunization: Secondary | ICD-10-CM

## 2012-12-05 DIAGNOSIS — J9610 Chronic respiratory failure, unspecified whether with hypoxia or hypercapnia: Secondary | ICD-10-CM

## 2012-12-05 DIAGNOSIS — J449 Chronic obstructive pulmonary disease, unspecified: Secondary | ICD-10-CM

## 2012-12-05 DIAGNOSIS — J9611 Chronic respiratory failure with hypoxia: Secondary | ICD-10-CM

## 2012-12-05 MED ORDER — TIOTROPIUM BROMIDE MONOHYDRATE 18 MCG IN CAPS: 18.0000 ug | ORAL_CAPSULE | Freq: Every day | RESPIRATORY_TRACT | Status: DC

## 2012-12-05 NOTE — Assessment & Plan Note (Addendum)
She has moderate airflow obstruction but minimal cough or other symptoms.  She does not have frequent exacerbations which is good.    Her risk for a peri-operative pulmonary complication is low so I feel she can undergo endoscopy or surgery if needed.  It should be noted that her comorbid illnesses and poor functional status contribute more significantly to her overall anesthesia risk than her COPD.  She has redundant inhaled therapies currently.  Plan: -Continue Spiriva -flu shot today -Stop Dulera, stop Advair -OK to proceed with surgery or endoscopy -f/u with me in one year

## 2012-12-05 NOTE — Patient Instructions (Signed)
Stop taking Dulera Stop taking Advair Use Spiriva once a day no matter how you feel We will see you back in one year or sooner if needed

## 2012-12-05 NOTE — Progress Notes (Signed)
Synopsis: Kathryn Hayes was diagnosed with COPD in 2007 and came to the [facility] for the first time in 2013.  She has been hospitalized for a COPD flare in the past.  He uses 2 L O2 continuously.  12/2010 Full PFT [facility]: Ratio 70%, FEV1 1.2 L (65%) clear obstruction on flow volume loop, TLC normal, RV 169% DLCO 41%    HPI    05/20/11 ROV -- Kathryn Hayes notes lots of shortness of breath in the last few days and in fact she is short of breath after just a few steps.  She notes rare chest pain sometimes described as substernal tightness associated with shortness of breath.  She does not have cough, fever, chills, or sputum production.  She does have some sinus syptoms, her eyes are watery and itchy, and she does have some wheezing.  She notes increased swelling in her legs.  She can lay flat while using her home CPAP machine.  Apparently a new blood pressure med has been added recently and she notes that her BP had been elevated at home previously (as high as 200 systolic).  She notes 20 lbs weight gain and swelling since December.  She drinks a lot of water.    12/05/2013 ROV > She is here for a perioperative pulmonary evaluation because she has been having a lot of abdominal pain lately and might need an endoscopy or surgery in the near future.  She has been doing well from a breathing standpoint.  She will get short of breath with exertion, but she attributes this to her weight and deconditioning. She does not have chest tightness, cough or wheezing.  She does not use any inhaled therapy regularly.  She might use albuterol once per month.    Past Medical History  Diagnosis Date  . Diabetes mellitus   . Hyperlipidemia   . Hypertension   . Chronic headache   . Breast cancer   . COPD (chronic obstructive pulmonary disease)     Review of Systems  Constitutional: Positive for fatigue and unexpected weight change. Negative for fever and chills.  HENT: Positive for congestion, postnasal drip,  rhinorrhea and sneezing. Negative for nosebleeds.   Respiratory: Positive for chest tightness and wheezing. Negative for cough, choking and shortness of breath.   Cardiovascular: Positive for chest pain and leg swelling. Negative for palpitations.      Objective:   Physical Exam   Filed Vitals:   12/05/12 1533  BP: 126/78  Pulse: 107  Temp: 97.9 F (36.6 C)  TempSrc: Oral  Height: 5\' 1"  (1.549 m)  Weight: 246 lb (111.585 kg)  SpO2: 97%  3 L nasal cannula  O2 saturation with 2L > 94% at rest; with walking on 3 L > 94%  Gen: chronically ill appearing, obese white female in no acute distress HEENT: NCAT, EOMi, OP clear, neck supple without masses PULM: CTA B CV: RRR, distant and difficult to hear heart sounds, cannot assess JVD AB: BS+, soft, nontender, no hsm Ext: warm, significant bilateral pitting leg edema   Review of January 2013 CXR : hyperinflation, increased pulmonary vascularity  CT Chest January 2013: no pe, no clear infiltrate or significant emphysema  12/2010 Full PFT [facility]: Ratio 70%, FEV1 1.2 L (65%) clear obstruction on flow volume loop, TLC normal, RV 169% DLCO 41%  Assessment & Plan:   COPD (chronic obstructive pulmonary disease) She has moderate airflow obstruction but minimal cough or other symptoms.  She does not have frequent exacerbations  which is good.    Her risk for a peri-operative pulmonary complication is low so I feel she can undergo endoscopy or surgery if needed.  It should be noted that her comorbid illnesses and poor functional status contribute more significantly to her overall anesthesia risk than her COPD.  She has redundant inhaled therapies currently.  Plan: -Continue Spiriva -flu shot today -Stop Dulera, stop Advair -OK to proceed with surgery or endoscopy -f/u with me in one year  Hypoxemic respiratory failure, chronic She can use 2 L at rest and 3 L with exertion based on today's evaluation.    Updated Medication  List Outpatient Encounter Prescriptions as of 12/05/2012  Medication Sig  . albuterol (PROVENTIL) (2.5 MG/3ML) 0.083% nebulizer solution Take 2.5 mg by nebulization every 4 (four) hours as needed.  Marland Kitchen amLODipine (NORVASC) 10 MG tablet Take 10 mg by mouth daily.  Marland Kitchen aspirin 81 MG tablet Take 81 mg by mouth daily.  . benzonatate (TESSALON) 100 MG capsule Take by mouth 3 (three) times daily as needed for cough.  . DULoxetine (CYMBALTA) 60 MG capsule Take 60 mg by mouth daily.  Marland Kitchen escitalopram (LEXAPRO) 20 MG tablet Take 20 mg by mouth daily.  . ferrous fumarate (HEMOCYTE - 106 MG FE) 325 (106 FE) MG TABS tablet Take 1 tablet by mouth daily.  . furosemide (LASIX) 40 MG tablet Take 1 tablet (40 mg total) by mouth daily.  Marland Kitchen glimepiride (AMARYL) 4 MG tablet Take 4 mg by mouth daily.  Marland Kitchen HYDROcodone-acetaminophen (NORCO/VICODIN) 5-325 MG per tablet Take 1 tablet by mouth every 4 (four) hours as needed for moderate pain.  Marland Kitchen LORazepam (ATIVAN) 1 MG tablet Take 1 mg by mouth 3 (three) times daily as needed.  . magnesium oxide (MAG-OX) 400 MG tablet Take 400 mg by mouth daily.  . metFORMIN (GLUCOPHAGE-XR) 750 MG 24 hr tablet Take 750 mg by mouth 3 (three) times daily.  . pantoprazole (PROTONIX) 40 MG tablet Take 40 mg by mouth daily.  . pioglitazone (ACTOS) 30 MG tablet Take 30 mg by mouth daily.  . pravastatin (PRAVACHOL) 40 MG tablet Take 40 mg by mouth daily.  Marland Kitchen tiotropium (SPIRIVA) 18 MCG inhalation capsule Place 1 capsule (18 mcg total) into inhaler and inhale daily.  . traMADol (ULTRAM) 50 MG tablet Take 50 mg by mouth 4 (four) times daily as needed.  . trandolapril (MAVIK) 4 MG tablet Take 4 mg by mouth daily.  . vitamin B-12 (CYANOCOBALAMIN) 1000 MCG tablet Take 1,000 mcg by mouth daily.  . [DISCONTINUED] Fluticasone-Salmeterol (ADVAIR DISKUS) 250-50 MCG/DOSE AEPB Inhale 1 puff into the lungs every 12 (twelve) hours.  . [DISCONTINUED] HYDROcodone-acetaminophen (VICODIN) 5-500 MG per tablet Take 1  tablet by mouth every 6 (six) hours as needed.  . [DISCONTINUED] mometasone-formoterol (DULERA) 100-5 MCG/ACT AERO Inhale 2 puffs into the lungs 2 (two) times daily.  . [DISCONTINUED] tiotropium (SPIRIVA) 18 MCG inhalation capsule Place 1 capsule (18 mcg total) into inhaler and inhale daily.  . potassium chloride (K-DUR) 10 MEQ tablet Take 2 tablets (20 mEq total) by mouth daily.  . [DISCONTINUED] hydrochlorothiazide (HYDRODIURIL) 25 MG tablet Take 25 mg by mouth daily.

## 2012-12-05 NOTE — Assessment & Plan Note (Signed)
She can use 2 L at rest and 3 L with exertion based on today's evaluation.

## 2012-12-11 ENCOUNTER — Ambulatory Visit: Payer: Self-pay | Admitting: Gastroenterology

## 2013-05-22 ENCOUNTER — Ambulatory Visit: Payer: Self-pay | Admitting: General Practice

## 2013-05-22 LAB — BASIC METABOLIC PANEL
ANION GAP: 4 — AB (ref 7–16)
BUN: 9 mg/dL (ref 7–18)
Calcium, Total: 8.6 mg/dL (ref 8.5–10.1)
Chloride: 101 mmol/L (ref 98–107)
Co2: 33 mmol/L — ABNORMAL HIGH (ref 21–32)
Creatinine: 0.77 mg/dL (ref 0.60–1.30)
EGFR (African American): >60
Glucose: 89 mg/dL (ref 65–99)
Osmolality: 274 (ref 275–301)
Potassium: 4 mmol/L (ref 3.5–5.1)
SODIUM: 138 mmol/L (ref 136–145)

## 2013-05-22 LAB — CBC
HCT: 35.2 % (ref 35.0–47.0)
HGB: 11.2 g/dL — ABNORMAL LOW (ref 12.0–16.0)
MCH: 27.4 pg (ref 26.0–34.0)
MCHC: 31.7 g/dL — AB (ref 32.0–36.0)
MCV: 86 fL (ref 80–100)
Platelet: 199 10*3/uL (ref 150–440)
RBC: 4.07 10*6/uL (ref 3.80–5.20)
RDW: 15 % — AB (ref 11.5–14.5)
WBC: 6.4 10*3/uL (ref 3.6–11.0)

## 2013-05-22 LAB — PROTIME-INR
INR: 1
PROTHROMBIN TIME: 12.7 s (ref 11.5–14.7)

## 2013-05-22 LAB — MRSA PCR SCREENING

## 2013-05-22 LAB — APTT: Activated PTT: 26.9 secs (ref 23.6–35.9)

## 2013-05-22 LAB — SEDIMENTATION RATE: ERYTHROCYTE SED RATE: 24 mm/h (ref 0–30)

## 2013-05-23 LAB — URINALYSIS, COMPLETE
BACTERIA: NONE SEEN
Bilirubin,UR: NEGATIVE
Blood: NEGATIVE
GLUCOSE, UR: NEGATIVE mg/dL (ref 0–75)
Ketone: NEGATIVE
NITRITE: NEGATIVE
Ph: 7 (ref 4.5–8.0)
Protein: NEGATIVE
RBC,UR: <1 /HPF (ref 0–5)
SPECIFIC GRAVITY: 1.009 (ref 1.003–1.030)
Transitional Epi: 1

## 2013-05-25 LAB — URINE CULTURE

## 2013-06-04 ENCOUNTER — Ambulatory Visit: Payer: Self-pay | Admitting: Cardiology

## 2013-06-06 ENCOUNTER — Inpatient Hospital Stay: Payer: Self-pay | Admitting: General Practice

## 2013-06-07 LAB — BASIC METABOLIC PANEL
Anion Gap: 4 — ABNORMAL LOW (ref 7–16)
BUN: 10 mg/dL (ref 7–18)
CALCIUM: 7.9 mg/dL — AB (ref 8.5–10.1)
Chloride: 100 mmol/L (ref 98–107)
Co2: 33 mmol/L — ABNORMAL HIGH (ref 21–32)
Creatinine: 0.97 mg/dL (ref 0.60–1.30)
GFR CALC NON AF AMER: 56 — AB
Glucose: 136 mg/dL — ABNORMAL HIGH (ref 65–99)
Osmolality: 275 (ref 275–301)
POTASSIUM: 4.1 mmol/L (ref 3.5–5.1)
Sodium: 137 mmol/L (ref 136–145)

## 2013-06-07 LAB — HEMOGLOBIN: HGB: 10.2 g/dL — ABNORMAL LOW (ref 12.0–16.0)

## 2013-06-07 LAB — PLATELET COUNT: Platelet: 173 10*3/uL (ref 150–440)

## 2013-06-08 LAB — BASIC METABOLIC PANEL
Anion Gap: 3 — ABNORMAL LOW (ref 7–16)
BUN: 6 mg/dL — ABNORMAL LOW (ref 7–18)
CALCIUM: 8.7 mg/dL (ref 8.5–10.1)
CHLORIDE: 97 mmol/L — AB (ref 98–107)
Co2: 34 mmol/L — ABNORMAL HIGH (ref 21–32)
Creatinine: 0.71 mg/dL (ref 0.60–1.30)
EGFR (African American): >60
Glucose: 90 mg/dL (ref 65–99)
Osmolality: 265 (ref 275–301)
POTASSIUM: 3.6 mmol/L (ref 3.5–5.1)
Sodium: 134 mmol/L — ABNORMAL LOW (ref 136–145)

## 2013-06-08 LAB — HEMOGLOBIN: HGB: 10.3 g/dL — AB (ref 12.0–16.0)

## 2013-06-08 LAB — PLATELET COUNT: PLATELETS: 169 10*3/uL (ref 150–440)

## 2013-06-11 LAB — CREATININE, SERUM
Creatinine: 0.61 mg/dL (ref 0.60–1.30)
EGFR (African American): >60
EGFR (Non-African Amer.): >60

## 2013-07-03 HISTORY — PX: TOTAL KNEE ARTHROPLASTY: SHX125

## 2014-02-21 ENCOUNTER — Inpatient Hospital Stay: Payer: Self-pay | Admitting: Internal Medicine

## 2014-02-22 DIAGNOSIS — R079 Chest pain, unspecified: Secondary | ICD-10-CM

## 2014-02-24 DIAGNOSIS — I442 Atrioventricular block, complete: Secondary | ICD-10-CM

## 2014-02-24 DIAGNOSIS — R001 Bradycardia, unspecified: Secondary | ICD-10-CM

## 2014-03-05 ENCOUNTER — Encounter
Admit: 2014-03-05 | Disposition: A | Discharge status: Home / Self Care | Payer: Self-pay | Attending: Internal Medicine | Admitting: Internal Medicine

## 2014-04-05 ENCOUNTER — Encounter
Admit: 2014-04-05 | Disposition: A | Discharge status: Home / Self Care | Payer: Self-pay | Attending: Internal Medicine | Admitting: Internal Medicine

## 2014-04-27 NOTE — Consult Note (Signed)
Brief Consult Note: Diagnosis: 1. Acute Hypoxic Hypercarbi Resp. failure 2. COPD 3. DM 4. HTN 5. s/p right total knee replacement 5. GERD 6. Depression 7. Hyperlipidemia.   Patient was seen by consultant.   Consult note dictated.   Orders entered.   Discussed with Attending MD.   Comments: 80 yo female w/ hx of COPD, HtN, OA, depression, Hyperlipidemia, GERD, came into hospital for total right knee replacement.  Post-operatively pt. was noted to be hypoxic Hypercarbic resp. failure.   1. Acute Hypoxic Hypercarbic resp. failure - likely due to underlying COPD.  - will cont. Bipap support and follow serial ABG's  - cont. maintenance meds for COPD.  cont. Advair, Spiriva, Duonebs q 4 hrs.  - place in CCU step down for now.   2. COPD - no acute exacerbation. CXR (-) for pneumonia.  - cont. Advair, Spiriva, duonebs q 4 hrs.  Follow serial ABG's.   3. DM - cont. Metformin, Glimeperide, Actos and follow BS  4. s/p right total knee replacement - cont. care as per Ortho.  - pain control as per Ortho.   5. Hyperlipidemia - cont. Pravachol  6. hx of Depression - cont. cymbalta, Lexapro.    7. HTN - cont. Norvasc, Trandolapril.    full Code  job # [phone]  thanks for the consult and will follow with you.  Electronic Signatures: Henreitta Leber (MD)  (Signed 03-Jun-15 15:31)  Authored: Brief Consult Note   Last Updated: 03-Jun-15 15:31 by Henreitta Leber (MD)

## 2014-04-27 NOTE — Discharge Summary (Signed)
PATIENT NAME:  Kathryn Hayes, HAALAND MR#:  [id number] DATE OF BIRTH:  1934-07-06  DATE OF ADMISSION:  06/06/2013 DATE OF DISCHARGE:  06/11/2013  ADMITTING DIAGNOSIS: Degenerative arthrosis of the right knee.   DISCHARGE DIAGNOSES:  1.  Degenerative arthrosis of the right knee.  2.  Exacerbation of chronic obstructive pulmonary disease.   CONSULTATION: Hospitalist, Dr. Claudean Kinds.   HISTORY: The patient is a pleasant 80 year old female, who has been followed at [facility] for progression of right knee pain. She reported a several year history of knee pain. The right knee pain had been greater than the left. She had localized most of the pain along the medial aspect of the right joint line. The patient had noticed increased discomfort with weight-bearing activities. She also had gone to using a walker for ambulation. She had not been seeing any significant relief of her discomfort with the use of goody powder, tramadol as well as cortisone and Synvisc injections. Recently, she states that the pain had increased to the point that it was significantly interfering with her activities of daily living. X-rays taken in [place] showed severe degenerative changes in a tricompartmental fashion with relative varus alignment. After discussion of the risks and benefits of surgery with the patient, the patient  expressed her understanding of the risks and benefits and agreed for plans for surgical intervention.   HOSPITAL COURSE:   PROCEDURE: Right total knee arthroplasty using computer-assisted navigation.   ANESTHESIA: Spinal.   SOFT TISSUE RELEASE: Anterior cruciate ligament, posterior cruciate ligament, deep and superficial medial collateral ligaments as well as the patellofemoral ligament.   IMPLANTS UTILIZED: DePuy PFC Sigma size 3 posterior stabilized femoral component (cemented), size 3 MBT tibial component (cemented), 35 mm 3-peg oval dome patella (cemented) and a 10 mm stabilized  rotating platform polyethylene insert.   The patient tolerated the procedure very well. She had no complications. However, upon being transferred to PACU she began having some increased difficulty with breathing and subsequently medical consult was obtained. She was taken to ICU overnight for observation. With a breathing treatment, she cleared and has had no problem. She was having no chest pains. The patient was then transferred to the orthopedic floor. She began receiving anticoagulation therapy of Lovenox 30 mg subcutaneous every 12 hours per anesthesia and pharmacy protocol. She was fitted with TED stockings bilaterally. These were allowed to be removed 1 hour per 8 hour shift. The right one was applied on day 2 following removal of the Hemovac and dressing change. The patient's heels were elevated off the bed using rolled towels. Negative Homans sign. No evidence of any DVTs.   The patient has denied any chest pains or shortness of breath. Vital signs have been stable. She has been afebrile. Hemodynamically she was stable. No transfusions were given other than the Autovac transfusions given postoperatively. The patient does run bradycardia and this was pretty much stable throughout the hospital course. She was noted to have slight increase in her blood pressure, but this was maintained with medication without any complications. The patient; however, was maintained on 3 and 4 L of oxygen because of her COPD. This kept her O2 sats in the low 90s.   Physical therapy was initiated on day 1 for gait training and transfers. Before being transferred, was ambulating approximately 40 feet. She was able to use a bedside commode, but a little hesitant to do so. The patient had been slow to progress with therapy. A lot of this is condition  and her COPD. Occupational therapy was also initiated on day 1 for ADLs and assistive devices.   The patient's IV, Foley and Hemovac were discontinued on day 2 along with the  dressing change. The wound was free of any drainage or signs of infection. Polar Care was reapplied to the surgical leg, maintaining a temperature of 40 to 50 degrees Fahrenheit.   DRUG ALLERGIES: No known drug allergies.   DISPOSITION: The patient is being discharged to skilled nursing facility in improved stable condition.   DISCHARGE INSTRUCTIONS: She may weight bear as tolerated. Continue PT for gait training and transfers. OT for ADLs and assistive devices. Continue to elevate lower extremity. Heels are to be elevated off the bed. Continue with TED stockings bilaterally. These are to be removed 1 hour per 8 hour shift. Continue to encourage the patient to use the incentive spirometer q.1 hour while awake. Encourage the patient to do cough and deep breathing q.2 hours while awake. Continue using Polar Care to the surgical leg, maintaining a temperature of 40 to 50 degrees Fahrenheit. The patient was instructed in wound care. Change dressing as needed. The patient is not to get the wound wet until staples are removed. She has a followup appointment with [facility] on June 16 at 9:30.   MEDICATIONS: The patient will continue with: 1.  Norvasc 10 mg q.a.m. 2.  Erythromycin 250 mg q.24 hours for 2 days. 3.  Cyanocobalamin 1000 mcg daily.  4.  Senokot-S 1 tablet b.i.d.  5.  Duloxetine 60 mg daily. 6.  Lexapro 20 mg daily.  7.  Ferrous sulfate 325 mg daily. 8.  Lasix 40 mg daily.  9.  Gabapentin 400 mg t.i.d.  10. Mucinex 600 mg b.i.d.  11. Mylanta 400 mg daily. 12. Trandolapril 4 mg daily. 13. Pantoprazole 40 mg b.i.d.  14. Pravastatin 40 mg at bedtime. 15. Carafate 1 gram before meals and at bedtime. 16. Desyrel 150 mg at bedtime. 17. Insulin sliding scale and Novolin injections. 18. Metformin 750 mg b.i.d. with meals.  19. Advair Diskus 250/50, 1 puff  b.i.d. p.r.n.  20. Tylenol ES 500 to 1000 mg q.4 to 6 hours for temperatures of 100.4 or greater.  21. Milk of magnesia 30 mL  b.i.d. p.r.n.  22. Dulcolax suppositories 10 mg rectally daily p.r.n.  23. Lorazepam 1 mg t.i.d. p.r.n.  24. Oxycodone 5 to 10 mg every 4 to 6 hours p.r.n. for pain. 25. Tramadol 50 to 100 mg q.4 to 6 hours p.r.n. for pain.  26. Enema soapsuds if no results with milk of magnesia or Dulcolax.   PAST MEDICAL HISTORY:  1.  Fibrocystic breast disease.  2.  Depression.   3.  Hypertension.  4.  Arthritis. 5.  Diabetes, type 2. 6.  Hyperlipidemia.  7.  Colon cancer.  8.  Sleep apnea.  9.  Obesity.  10. Breast cancer.   ____________________________ Vance Peper, PA jrw:[id number] D: 06/11/2013 10:52:44 ET T: 06/11/2013 11:16:15 ET JOB#: [id number]  cc: Vance Peper, PA, <Dictator> JON WOLFE PA ELECTRONICALLY SIGNED 06/28/2013 7:45

## 2014-04-27 NOTE — Consult Note (Signed)
PATIENT NAME:  Kathryn Hayes, Kathryn Hayes MR#:  [id number] DATE OF BIRTH:  14-Dec-1934  DATE OF CONSULTATION:  06/06/2013  REFERRING PHYSICIAN:  Dr. Skip Estimable CONSULTING PHYSICIAN:  Belia Heman. Verdell Carmine, MD  PRIMARY CARE PHYSICIAN: Dr. Juluis Pitch.   REASON FOR CONSULTATION: Hypoxia and respiratory failure.   HISTORY OF PRESENT ILLNESS: This is a 80 year old female who was electively brought in for a right total knee replacement. Postoperatively in the PACU, the patient was noted to be hypoxic with O2 sats in the 70s and even on CPAP with O2 sats in the high 80s. ABG revealed hypoxic hypercarbic respiratory failure. Hospitalist services were contacted for further treatment and evaluation. The patient denies any acute shortness of breath, any chest pain, any nausea, vomiting, abdominal pain, fevers, chills, productive cough or any other associated symptoms. The only symptom she has right now is right knee pain after her surgery.   REVIEW OF SYSTEMS:  CONSTITUTIONAL: No documented fever. No weight gain. No weight loss.  EYES: No blurry or double vision.  ENT: No tinnitus. No postnasal drip. No redness of the oropharynx.  RESPIRATORY: No cough. No wheeze. No hemoptysis. No dyspnea.  CARDIOVASCULAR: No chest pain. No orthopnea. No palpitations. No syncope.  GASTROINTESTINAL: No nausea. No vomiting. No diarrhea. No abdominal pain. No melena or hematochezia.  GENITOURINARY: No dysuria and hematuria.  ENDOCRINE: No polyuria or nocturia. No heat or cold intolerance.  HEMATOLOGIC: No anemia. No bruising. No bleeding.  INTEGUMENT: No rashes. No lesions.  MUSCULOSKELETAL: No arthritis. No swelling. No gout.  NEUROLOGIC: No numbness or tingling. No ataxia. No seizure-type activity.  PSYCHIATRIC: No anxiety. No insomnia. No ADD.   PAST MEDICAL HISTORY: Consistent with COPD oxygen-dependent, hypertension, hyperlipidemia, diabetes, depression, GERD.  Osteoarthritis.   ALLERGIES:  No known drug allergies.    SOCIAL HISTORY: She used to be a smoker. Does have a 50 pack-year smoking history, quit about 2 years ago. Occasional alcohol use. No illicit drug abuse. Lives at home with her husband.   FAMILY HISTORY: Both mother and father are deceased. Mother died from old age. Father had heart disease.   CURRENT MEDICATIONS: As follows: Metformin 750 mg t.i.d., glimepiride 4 mg daily, amlodipine 10 mg daily, trandolapril 4 mg daily, aspirin 81 mg daily, Lexapro 20 mg daily, Pravachol 40 mg daily, Cymbalta 60 mg daily, lorazepam 1 mg t.i.d. as needed, meclizine 25 mg t.i.d. as needed, Spiriva 1 puff daily, Advair 250/50 one puff b.i.d., Lasix 40 mg daily, Flonase 2 sprays to each nostril daily, albuterol inhaler q.4 hours as needed, sucralfate 1 gram q.i.d., magnesium oxide 400 mg daily, tramadol 50 mg every 4 hours as needed, Protonix 40 mg b.i.d., iron sulfate as directed, vitamin B12 daily, Actos 30 mg 1 tab daily.   PHYSICAL EXAMINATION: Presently is as follows:  VITAL SIGNS: Temperature, she is 97.7, pulse 48, respirations 22, blood pressure 168/32, sats 86% on BiPAP.  GENERAL: She is lethargic but alert, oriented female in mild distress. HEAD, EYES, EARS, NOSE AND THROAT:  She is atraumatic, normocephalic. Extraocular muscles are intact. Pupils equal and reactive to light.  Sclerae anicteric. No conjunctival injection. No pharyngeal erythema.  NECK: Supple. There is no jugular venous distention. No bruits. No lymphadenopathy. No thyromegaly.  HEART: Regular rate and rhythm. No murmurs. No rubs. No clicks.  LUNGS: Clear to auscultation bilaterally. Negative use of accessory muscles.  No dullness to percussion. No rales, rhonchi. No wheezes.  ABDOMEN: Soft, flat, nontender, nondistended. Has good bowel sounds. No hepatosplenomegaly  appreciated.  EXTREMITIES: No evidence of any cyanosis, clubbing or peripheral edema. Has +2 pedal and radial pulses bilaterally.  NEUROLOGICAL: The patient is alert, awake,  oriented x 3 with no focal motor or sensory deficits appreciated bilaterally.  SKIN: Moist, warm with no rashes appreciated.  LYMPHATIC: There is no cervical or axillary lymphadenopathy.   LABORATORY DATA: ABG showed a pH of 7.27, pCO2 65, pO2 of 71, sats 91% on CPAP.   ASSESSMENT AND PLAN: This is a 80 year old female with a history of chronic obstructive pulmonary disease, hypertension, osteoarthritis, depression, hyperlipidemia, gastroesophageal reflux disease, presents to the hospital due to a right total knee replacement. Postoperatively, the patient was noted to be in hypoxic hypercarbic respiratory failure. 1.  Acute hypoxic hypercarbic respiratory failure. This is likely secondary to underlying chronic obstructive pulmonary disease. The patient was on CPAP but I will switch her to BiPAP support for now. Follow serial ABGs. I will continue her maintenance medications for chronic obstructive pulmonary disease including Advair, Spiriva; placed on DuoNeb every 4 hours. She has no bronchospasm. Chest x-ray is negative for pneumonia; therefore, hold off on IV steroids and antibiotics at this time. Place her in CCU stepdown for now.  2.  Chronic obstructive pulmonary disease. No acute exacerbation. Chest x-ray is negative for pneumonia as mentioned. Continue Advair, Spiriva and every 4 hour DuoNeb and follow serial ABGs for now. 3.  Diabetes. Continue her metformin, glimepiride and Actos and a carbohydrate-controlled diet. Follow blood sugars. 4.  Status post right total knee replacement. Continue care as per orthopedics. Pain control as per orthopedics.  5.  Hyperlipidemia. Continue with her Pravachol.  6.  History of depression. Continue with her Cymbalta and Lexapro.  7.  Hypertension. The patient is hemodynamically stable. Continue Norvasc and trandolapril for now.   The patient is a FULL CODE.  Thank you so much for the consultation. We will follow along with you.  CRITICAL CARE TIME SPENT:   50 minutes.  ____________________________ Belia Heman. Verdell Carmine, MD vjs:ce D: 06/06/2013 15:31:31 ET T: 06/06/2013 16:35:48 ET JOB#: [id number]  cc: Belia Heman. Verdell Carmine, MD, <Dictator> Henreitta Leber MD ELECTRONICALLY SIGNED 06/09/2013 21:58

## 2014-04-27 NOTE — Op Note (Signed)
PATIENT NAME:  Kathryn Hayes, DOBRANSKY MR#:  [id number] DATE OF BIRTH:  06-27-1934  DATE OF PROCEDURE:  06/06/2013  PREOPERATIVE DIAGNOSIS: Degenerative arthrosis of the right knee.   POSTOPERATIVE DIAGNOSIS: Degenerative arthrosis of the right knee.   PROCEDURE PERFORMED: Right total knee arthroplasty using computer-assisted navigation.   SURGEON:  Skip Estimable, MD   ASSISTANT:  Vance Peper, PA (required to maintain retraction throughout the procedure).  ANESTHESIA: Spinal.   ESTIMATED BLOOD LOSS: 50 mL.   FLUIDS REPLACED: 1200 mL of crystalloid.   TOURNIQUET TIME: 92 minutes.   DRAINS: Two medium drains to reinfusion system.   SOFT TISSUE RELEASES: Anterior cruciate ligament, posterior cruciate ligament, deep and superficial medial collateral ligament, and patellofemoral ligament.   IMPLANTS UTILIZED: DePuy PFC Sigma size 3 posterior stabilized femoral component (cemented), size 3 MBT tibial component (cemented), 35 mm 3 peg oval dome patella (cemented), and a 10 mm stabilized rotating platform polyethylene insert.  INDICATIONS FOR SURGERY: The patient is a 80 year old female who has been seen for complaints of progressive right knee pain. X-rays demonstrated severe degenerative changes in tricompartmental fashion with relative varus deformity. After discussion of the risks and benefits of surgical intervention, the patient expressed understanding of the risks and benefits and agreed with plans for surgical intervention.   PROCEDURE IN DETAIL: The patient was brought into the operating room, and after adequate spinal anesthesia was achieved, a tourniquet was placed on the patient's upper right thigh. The patient's right knee and leg were cleaned and prepped with alcohol and DuraPrep draped in the usual sterile fashion. A "timeout" was performed as per usual protocol. The right lower extremity was exsanguinated using an Esmarch; tourniquet was inflated to 300 mmHg. An anterior longitudinal  incision was made followed by a standard mid vastus approach. A large effusion was evacuated. The deep fibers of the medial collateral ligament were elevated in a subperiosteal fashion off the medial flare of the tibia so as to maintain a continuous soft tissue sleeve. The patella was subluxed laterally and the patellofemoral ligament was incised. Inspection of the knee demonstrated severe degenerative changes in tricompartmental fashion. Full-thickness loss of articular cartilage was noted medially. Prominent osteophytes were debrided using a rongeur. Anterior and posterior cruciate ligaments were excised. Two 4.0 mm Schanz pins were inserted into the femur and into the tibia for attachment of the array of trackers used for computer-assisted navigation. Hip center was identified using circumduction technique. Distal landmarks were mapped using the computer. The distal femur and proximal tibia were mapped using the computer. The distal femoral cutting guide was positioned using computer-assisted navigation so as to achieve 5-degree distal valgus cut. Cut was performed and verified using the computer. This was followed by completion of the posterior and chamfer cuts. Femoral cutting guide for a central box was then positioned.  A central box cut was performed. Attention was then directed to the proximal tibia. Medial and lateral menisci were excised. The extramedullary tibial cutting guide was positioned using computer-assisted navigation so as to achieve 0 degree varus valgus alignment and 0 degree posterior slope. Cut was performed and verified using the computer. The proximal tibia was sized and it was felt that a size 3 tibial tray was appropriate. Tibial and femoral trials were inserted followed by insertion of a 10 mm polyethylene insert. The knee was felt to be tight both in flexion and extension. Trial components were removed and the extramedullary tibial cutting guide was positioned so as to resect an  additional 2  mm of bone. Cut was performed and verified using the computer. Trials were reinserted followed by insertion of a 10 mm polyethylene insert. He was still felt to be tight medially. A Cobb elevator was used to elevate the superficial fibers of the medial collateral ligament. This allowed for excellent mediolateral soft tissue balancing, both in full extension and in flexion. Finally, the patella was cut and prepared so as to accommodate a 35 mm 3 peg oval dome patella. Patellar trial was placed and the knee was placed through a range of motion with excellent patellar tracking appreciated. The femoral trial was removed. Central post hole for the tibial component was reamed followed by insertion of a keel punch. Tibial trials were removed.  Cut surfaces of bone were irrigated with copious amounts of normal saline with antibiotic solution using pulsatile lavage and then suctioned dry. Polymethylmethacrylate cement with gentamicin was prepared in the usual fashion using a vacuum mixer. Cement was applied to the cut surface of the proximal tibia as well as along the undersurface of size 3 MBT tibial component. The tibial component was positioned and impacted into place. Excess cement was removed using freer elevators. Cement was then applied to the cut surface of the femur as well as on the posterior flanges of a size 3 posterior stabilized femoral component. Femoral component was positioned and impacted into place. Excess cement was removed using freer elevators. A 10 mm polyethylene trial was inserted and the knee was brought into full extension with steady axial compression applied. Finally, cement was applied to the backside of a 35 mm 3 peg oval dome patella and the patellar component was positioned and patellar clamp applied. Excess cement was removed using freer elevators.   After adequate curing of cement, the tourniquet was deflated after total tourniquet time of 92 minutes. Hemostasis was achieved  using electrocautery. The knee was irrigated with copious amounts of normal saline with antibiotic solution using pulsatile lavage and suctioned dry. The knee was inspected for any residual cement debris, 20 mL of 1.3% Exparel in 40 mL of normal saline was injected along the posterior capsule, medial and lateral gutters, and along the arthrotomy site. A 10 mm stabilized rotating platform polyethylene insert was inserted and the knee was placed through range of motion with excellent patellar tracking noted and excellent mediolateral soft tissue balancing appreciated. Two medium drains were placed in the wound bed and brought out through a separate stab incision to be attached to a reinfusion system. The medial parapatellar portion of the incision was reapproximated using interrupted sutures of #1 Vicryl. The subcutaneous tissue was approximated in layers using first #0 Vicryl followed by 2-0 Vicryl. Skin was closed with skin staples, 30 mL of 0.25% Marcaine with epinephrine was injected along the subcutaneous tissue in line with the surgical incision.   Sterile dressing was applied followed by application of Polar Care device. The patient tolerated the procedure well. She was transported to the recovery room in stable condition.    ____________________________ Laurice Record. Holley Bouche., MD jph:dd D: 06/06/2013 16:17:02 ET T: 06/06/2013 19:18:22 ET JOB#: [id number]  cc: Jeneen Rinks P. Holley Bouche., MD, <Dictator> JAMES P Holley Bouche MD ELECTRONICALLY SIGNED 06/11/2013 7:26

## 2014-04-28 NOTE — Consult Note (Signed)
General Aspect 80 yo female with history of obesity, sleep apnea who was admitted with progressive shortness of breath and chronic dizziness.    Present Illness Pt with history of sleep apnea, hypertension, oxygen dependent copd who was admitted after presenting to the er with complaints of increased shortness of breath. She has ruled out for an mi. CXR without signficant airspace disease. Telemetry monitor has revealed intermittant 2nd degree heart block consistant with 2:1 Wenkebach vs Mobitz II heart block. Pt has chronic dizziness. Her symptoms occur immediatly upon standping and occasionally while seated. She was dizzy during my evaluation and heart rate was 55-65 with sbp greater than 130. She states she is compliant with her cpap. She denies chest pain   Physical Exam:   GEN no acute distress, obese    HEENT PERRL    NECK supple  No masses    RESP clear BS  no use of accessory muscles  rhonchi    CARD Regular rate and rhythm  Murmur    Murmur Systolic    Systolic Murmur axilla    ABD denies tenderness  normal BS    LYMPH negative neck    EXTR negative cyanosis/clubbing, positive edema    SKIN normal to palpation    NEURO cranial nerves intact, motor/sensory function intact    PSYCH A+O to time, place, person   Review of Systems:   Subjective/Chief Complaint shortness of breath and dizziness    General: Fatigue  Weakness    Skin: No Complaints    ENT: No Complaints    Eyes: No Complaints    Neck: No Complaints    Respiratory: Short of breath    Cardiovascular: Dyspnea    Gastrointestinal: No Complaints    Genitourinary: No Complaints    Vascular: No Complaints    Musculoskeletal: No Complaints    Hematologic: No Complaints    Endocrine: No Complaints    Psychiatric: No Complaints    Review of Systems: All other systems were reviewed and found to be negative    Medications/Allergies Reviewed Medications/Allergies reviewed     Depression:     Colon or Rectal Cancer:    Breast Cancer:    nerves:    GERD - Esophageal Reflux:    Hypercholesterolemia:    Hypertension:    Diabetes Mellitus, Type II (NIDD):    Tonsillectomy:    Colon Resection:   Home Medications: Medication Instructions Status  amlodipine 10 mg oral tablet 1 tab(s) orally once a day  Active  Cymbalta 60 mg oral delayed release capsule 1 cap(s) orally once a day Active  aspirin 81 mg oral tablet 1 tab(s) orally once a day Active  glimepiride 4 mg oral tablet 1 tab(s) orally once a day Active  hydrochlorothiazide 25 mg oral tablet 1 tab(s) orally once a day Active  Lexapro 20 mg oral tablet 1 tab(s) orally once a day Active  lorazepam 1 mg oral tablet 1 tab(s) orally , As Needed Active  metformin 750 mg oral tablet, extended release 1 tab(s) orally once a day Active  pravastatin 40 mg oral tablet 1 tab(s) orally once a day (at bedtime) Active  Protonix 40 mg oral delayed release tablet 1 tab(s) orally once a day Active  tramadol 50 mg oral tablet 1 tab(s) orally every 4 hours Active  trandolapril 4 mg oral tablet 1 tab(s) orally once a day Active  Advair Diskus 250 mcg-50 mcg inhalation powder 1 puff(s) inhaled 2 times a day Active  Spiriva  18 mcg inhalation capsule 1 each inhaled once a day Active   EKG:   EKG NSR    Interpretation intermitant second degree heart block   Radiology Results: XRay:    18-May-13 13:03, Chest PA and Lateral   Chest PA and Lateral    REASON FOR EXAM:    SOB  COMMENTS:       PROCEDURE: DXR - DXR CHEST PA (OR AP) AND LATERAL  - May 22 2011  1:03PM     RESULT: Comparison: 01/07/2011    Findings:  Cardiomegaly and the mediastinum are similar to prior. There mild   bilateral interstitial opacities. There is pulmonary vascular   redistribution. There are small bilateral pleural effusions.    IMPRESSION:   Mild interstitial pulmonary edema.      Verified By: Gregor Hams, M.D., MD  [place]:    19-May-13 14:41,  [place] Color Flow Doppler Low Extrem Bilat   [place] Color Flow Doppler Low Extrem Bilat    REASON FOR EXAM:    swelling, pain  COMMENTS:       PROCEDURE: [place]  - US DOPPLER LOW EXTR BILATERAL  - May 23 2011  2:41PM     RESULT: Comparison: None    Findings: Multiple longitudinal and transverse gray-scale as well as   color and spectral Doppler images of the bilateral lower extremity veins   were obtained from the common femoral veins through the popliteal veins.    The bilateral common femoral, femoral, and popliteal veins are patent,   demonstrating normal color-flow and compressibility.No intraluminal   thrombus is identified.There is normal respiratory variation and   augmentation demonstrated at all vein levels.  IMPRESSION:  No evidence of DVT in the bilateral lower extremities.          Verified By: Gregor Hams, M.D., MD  CT:    19-May-13 14:00, CT Chest for Pulm Embolism With Contrast   CT Chest for Pulm Embolism With Contrast    REASON FOR EXAM:    shortness of breath, hypoxia,  COMMENTS:       PROCEDURE: CT  - CT CHEST (FOR PE) W  - May 23 2011  2:00PM     RESULT: Comparison: 01/07/2011    Technique: Multiple thin section axial images were obtained from the lung   apices to the upper abdomen following 100 ml Isovue 370 intravenous   contrast, according to the PE protocol. These images were also reviewed   on a Siemens multiplanar work station.    Findings:   No mediastinal, hilar, or axillary lymphadenopathy. The left thyroid lobe   is somewhat atrophic. Calcifications are seen in the coronary arteries.     There are trace bilateral pleural effusions. There is mild thickening of   the adrenal glands. Low-attenuation lesion in the left kidney most likely   represents acyst. The thoracic aorta is normal in caliber. There are   mild atherosclerotic plaques in the thoracic aorta. No pulmonary embolus   identified.    There is a 3 mm nodule in the right middle lobe, similar  to prior. Mild   basilar opacities are likely secondary to atelectasis. The central   airways are patent.    No aggressive lytic or sclerotic osseous lesions are identified.    IMPRESSION:   1. No pulmonary embolus identified.  2. Trace bilateral pleural effusions.    Verified By: Gregor Hams, M.D., MD    No Known Allergies:     Impression 80  yo female with history of sleep apnea on cpap, hypertenison and obesity admitted with progressive shortness of breath. Ruled out for an mi. Transient second degree heart block which does not appear to be symtpomatic. Dizziness is fairly chronic and appears to be predominantly orthostatic.    Plan 1. Ambulate and follow symptoms and heart rate and discharge in am if stable 2. Continue off rate related meds. 3. Place 24 holter at discharge 4. Will follow as outpatient 5. Continue with cpap   Electronic Signatures: Teodoro Spray (MD)  (Signed 21-May-13 08:28)  Authored: General Aspect/Present Illness, History and Physical Exam, Review of System, Past Medical History, Home Medications, EKG , Radiology, Allergies, Impression/Plan   Last Updated: 21-May-13 08:28 by Teodoro Spray (MD)

## 2014-04-28 NOTE — H&P (Signed)
PATIENT NAME:  Kathryn Hayes, Kathryn Hayes MR#:  [id number] DATE OF BIRTH:  04-Aug-1934  DATE OF ADMISSION:  01/07/2011  REFERRING PHYSICIAN: Conni Slipper, MD  PRIMARY CARE PHYSICIAN: Juluis Pitch, MD  PRIMARY PULMONOLOGIST: Wallene Huh, MD  PRIMARY ONCOLOGIST: Forest Gleason, MD  CHIEF COMPLAINT: Chest pain and shortness breath.   HISTORY OF PRESENT ILLNESS: The patient is a pleasant 80 year old female with extensive past medical history as listed below including obesity, hypertension, hyperlipidemia, diabetes mellitus, chronic respiratory failure, oxygen-dependent chronic obstructive pulmonary disease, obstructive sleep apnea, and breast and colon cancer who presented to the Emergency Department with the above-mentioned chief complaint. The patient states that she has had worsening chest heaviness over the past week associated with shortness of breath. She also has a pleuritic component of chest pain. She reports substernal chest heaviness and pain which is nonradiating and worse with exertion and better with rest associated with pleuritic component. She denies cough, fever, or chills at this time. She denies experiencing similar symptoms in the past. Symptoms have been getting worse over the past week. She came to the ER for further evaluation of her symptoms. Troponin was checked and was found to be negative. EKG reveals normal sinus rhythm with right bundle branch block. No prior EKGs for comparison in our system. She is also awaiting CT of the chest with PE protocol. Her chest pain had resolved on its own and she is currently chest pain free at the time of my evaluation. Otherwise, she is without specific complaints at this time. Per her daughter, she may have had some blood in her stool when she was wiping; the patient is unaware. She denies any abdominal pain. Otherwise, the patient is without specific complaints at this time. Hospitalist services were contacted for further evaluation and for hospital  admission.   PAST SURGICAL HISTORY:  1. Appendectomy.  2. Partial colectomy.  3. Lumpectomy.  4. Tonsillectomy.  PAST MEDICAL HISTORY:  1. Obesity. 2. Hypertension. 3. Hyperlipidemia.  4. Type 2 diabetes mellitus.  5. Chronic respiratory failure secondary to chronic obstructive pulmonary disease. 6. Oxygen-dependent chronic obstructive pulmonary disease. 7. Recently diagnosed pulmonary hypertension. The patient is being followed by Dr. Raul Del.  8. Obstructive sleep apnea.  9. Gastroesophageal reflux disease. 10. Depression/anxiety. 11. History of breast cancer status post lumpectomy and radiation therapy, followed by Dr. Forest Gleason. 12. History of colon cancer status post partial colectomy.  13. History of cataracts.  DRUG ALLERGIES: No known drug allergies.   HOME MEDICATIONS:  1. Metformin 750 mg p.o. three times daily. 2. Glyburide 4 mg daily. 3. Norvasc 10 mg daily. 4. Trandolapril 4 mg daily. 5. Aspirin 81 mg daily. 6. HCTZ 25 mg daily. 7. Lexapro 20 mg daily. 8. Pravastatin 40 mg daily. 9. Protonix 40 mg daily. 10. Cymbalta 60 mg daily. 11. Lorazepam 1 mg as needed, mostly around bedtime. 12. Tramadol 150 mg p.o. four times daily p.r.n.  13. BC Powder's p.r.n., uses frequently.  14. Oxygen at 3 liters per minute via nasal cannula continuously during the day and she uses 5 liters per minute with ambulation and exertion. 15. CPAP at bedtime.  FAMILY HISTORY: Mother deceased secondary to breast cancer. Father had unspecified heart disease and died likely from renal failure but also had colon blockages.   SOCIAL HISTORY: Tobacco - she quit approximately 3 weeks ago. In the past she was smoking two packs per day for 50 years. Alcohol - none.  Illicit drugs - none. The patient lives in [place], [place] at home by herself. Her daughter and son accompany her today.   REVIEW OF SYSTEMS: CONSTITUTIONAL: Denies fevers, chills, or recent changes in weight or  weakness. HEAD/EYES: Denies headache or blurry vision. ENT: Denies tinnitus, earache, nasal discharge, or sore throat. RESPIRATORY: Reports shortness of breath. Denies cough or wheezing. CARDIOVASCULAR: Reports chest pain. Denies orthopnea or lower extremity edema, or heart palpitations. GASTROINTESTINAL: Denies nausea, vomiting, diarrhea, constipation, melena, hematochezia, or abdominal pain. GENITOURINARY: Denies dysuria or hematuria. ENDOCRINE: Denies heat or cold intolerance. HEME/LYMPH: Denies easy bruising or bleeding. INTEGUMENT: Denies rash. MUSCULOSKELETAL: Denies joint pain or muscle weakness. NEUROLOGIC: Denies headache, numbness, weakness, tingling, or dysarthria. PSYCHIATRIC: Has underlying depression and anxiety.   PHYSICAL EXAMINATION:   VITAL SIGNS: Temperature 97.5, pulse 62, blood pressure 182/59, respirations 22, and oxygen saturation 96% on 3 liters nasal cannula.   GENERAL: The patient is obese, alert and oriented, slightly tachypneic, otherwise not acutely distressed. She is able to speak in full sentences.   HEENT: Normocephalic, atraumatic. Pupils are equal, round and reactive to light accommodation. Extraocular muscles are intact. Anicteric sclerae. Conjunctivae pink. Hearing intact to voice. Nares without drainage. Oral mucosa is moist without lesions.   NECK: Supple with full range of motion. No jugular venous distention, lymphadenopathy or carotid bruits bilaterally. No thyromegaly or tenderness to palpation over the thyroid gland.   LUNGS: Slightly tachypneic but not using accessory respiratory muscles to breathe. Lungs are essentially clear to auscultation bilaterally, except for a few crackles versus rales along the right base. No wheezing or rhonchi. Left lung is clear to auscultation.   HEART: S1 and S2 positive but distant. Regular rate and rhythm. No murmurs, rubs, or gallops. PMI is non-lateralized.   ABDOMEN: Obese, soft, nontender, and nondistended. Normoactive  bowel sounds. No hepatosplenomegaly or palpable masses. No hernias.   EXTREMITIES: No clubbing, cyanosis, or edema. Pedal pulses are palpable bilaterally.   SKIN: No suspicious rashes.   LYMPH: No cervical lymphadenopathy.   NEURO: Alert and oriented x3. Cranial nerves II through XII grossly intact. No focal deficits.   PSYCH: Pleasant female with appropriate affect.  LABS/STUDIES: Portable chest x-ray is pending at this time.   EKG: Normal sinus rhythm at 84 beats per minute with normal axis and right bundle branch block. No prior EKGs for comparison.   CBC is within normal limits. BMP within normal limits, except for serum glucose 52.   Troponin less than 02.   ASSESSMENT AND PLAN: This is a 80 year old female with past medical history of obesity, hypertension, hyperlipidemia, type 2 diabetes mellitus, chronic respiratory failure, oxygen-dependent chronic obstructive pulmonary disease, obstructive sleep apnea, breast and colon cancer here with: 1. Pleuritic chest pain with shortness of breath - exact etiology is unclear. Recommend hospital admission for further evaluation and management. Differential diagnosis would include PE versus myocardial infarction. We will admit the patient to the telemetry unit. We will continue to cycle her cardiac enzymes. In the meanwhile, we will keep her on oxygen, aspirin, Lovenox, nitrate, statin, ACE inhibitor, and p.r.n. morphine therapy. Chest x-ray is pending and will be followed. We will also obtain CT of the chest to assess for possible underlying pulmonary embolus and also proceed with Myoview tomorrow as Masoner as cardiac enzymes are negative. We will avoid beta blocker given chronic obstructive pulmonary disease and risk of bronchospasm. Further work-up and management to follow depending on the patient's clinical course. 2. Hypoglycemia - hold oral hyperglycemics including metformin and glyburide. She is currently asymptomatic. Her hypoglycemia  should  improve/resolve with p.o. intake as she has been started on a diet in the ER. We will monitor Accu-Cheks frequently.  3. Chronic respiratory failure - continue oxygen for chronic obstructive pulmonary disease and also await chest x-ray and chest CT. 4. Chronic obstructive pulmonary disease - continue oxygen. She is followed by Dr. Raul Del. She has a 100 pack-year history of smoking. We will start Advair and Spiriva and also provide p.r.n. bronchodilator support with DuoNebs as well as p.r.n. albuterol metered dose inhaler. 5. Obstructive sleep apnea - continue nocturnal CPAP. 6. Hypertension, uncontrolled - we will continue trandolapril, HCTZ, Norvasc, and also nitrate therapy for now and monitor blood pressure closely. We will also write for p.r.n. IV hydralazine.  7. Hyperlipidemia - continue on Pravastatin and check fasting lipid profile in the a.m.  8. Type 2 diabetes mellitus - hold glipizide and metformin given hypoglycemia and keep the patient on sliding insulin for now, give diabetic diet, and check hemoglobin A1c.  9. Pulmonary hypertension - currently being followed by Dr. Raul Del. She had recent outpatient echocardiogram revealing normal LVEF with pulmonary hypertension.  10. Depression/anxiety - continue Lexapro and p.r.n. Ativan.  11. Questionable blood in the stool per daughter while wiping - could be hemorrhoidal. We will hold [facility] Powder for now. The benefits of giving aspirin and Lovenox currently outweigh the bleeding risk. We will check stool Hemoccults and monitor hemoglobin and hematocrit closely. She is currently not anemic and has normal BUN. We will keep her on twice a day proton pump inhibitor therapy for now. Further work-up and management to follow depending on the patient's clinical course.  12. Deep venous thrombosis prophylaxis - Lovenox.  CODE STATUS: FULL CODE.             The case was discussed with the patient and her son and daughter all of whom are in  agreement with current plan of care and management.  TIME SPENT ON ADMISSION: Approximately 60 minutes. ____________________________ Romie Jumper, MD knl:slb D: 01/07/2011 21:32:56 ET T: 01/08/2011 07:58:46 ET JOB#: [id number]  cc: Romie Jumper, MD, <Dictator> Youlanda Roys. Lovie Macadamia, MD Herbon E. Raul Del, MD Romie Jumper MD ELECTRONICALLY SIGNED 01/21/2011 18:55

## 2014-04-28 NOTE — Consult Note (Signed)
Brief Consult Note: Diagnosis: Pt admitted with shortness of breath and dizziness. Dizziness persists with tele revealing intermittant 2nd degree heart block.   Consult note dictated.   Recommend further assessment or treatment.   Discussed with Attending MD.   Comments: Pt with history of sleep apnea, oxygen dependent copd admitted with shortness of breath. Ruled out for an mi. Telemetry reveals intermitant 2nd degree heart block consistant with 2:1 Mobitz 1 vs Mobitz 2. Symtpoms are fairly constant and do not appear to correlate with the episodes of heart block. Resting heart rate is 50-70. Hemodynaically stable. WIll follow off rate related drugs. Does not appear to require ppm at present. Would follow and discharge on holter if there are no prolonged episodes of symtpomatic brady cardia or pauses. FUll note to follow.  Electronic Signatures: Teodoro Spray (MD)  (Signed 20-May-13 12:55)  Authored: Brief Consult Note   Last Updated: 20-May-13 12:55 by Teodoro Spray (MD)

## 2014-04-28 NOTE — Discharge Summary (Signed)
PATIENT NAME:  DAYTON, SHERR MR#:  [id number] DATE OF BIRTH:  11-Apr-1934  DATE OF ADMISSION:  05/22/2011 DATE OF DISCHARGE:  05/25/2011  DIAGNOSES:  1. Acute on chronic respiratory failure possibly due to anxiety and chronic obstructive pulmonary disease.  2. Chronic obstructive pulmonary disease, oxygen dependent. 3. Diabetes. 4. Hypertension. 5. Sleep apnea. 6. Depression, anxiety. 7. Intermittent second degree heart block, currently in sinus.   DISPOSITION: Patient is being discharged home.   FOLLOW UP: Follow up with Dr. Ubaldo Glassing and primary care physician, Dr. Lovie Macadamia, in 1 to 2 weeks after discharge. Discharged patient home with Holter monitor, home health PT and OT.   DIET: Low sodium, 1800 calorie ADA diet.   ACTIVITY: As tolerated.   DISCHARGE MEDICATIONS:  1. Lasix 20 mg daily.  2. Amlodipine 10 mg daily.  3. Cymbalta 60 mg daily.  4. Aspirin 81 mg daily.  5. Glimepiride 4 mg daily.  6. Ativan 1 mg as needed.  7. Metformin 750 mg daily.  8. Pravachol 40 mg daily.  9. Protonix 40 mg daily.  10. Tramadol 50 mg q.4 hours. 11. Trandolapril 4 mg daily.  12. Advair 250/50, 1 puff b.i.d.  13. Spiriva 18 mcg inhaled daily.  14. Patient is on home oxygen 2 to 3 liters continuous.   LABORATORY, DIAGNOSTIC, AND RADIOLOGICAL DATA: CT of the chest showed no evidence of PE, trace bilateral pleural effusions. Bilateral lower extremity Doppler showed no evidence of any deep vein thrombosis. Chest x-ray showed no acute abnormality other than mild interstitial pulmonary edema. D-dimer 0.66. Normal CBC. BNP 670, glucose 178 to 107. Normal electrolytes. LDL 79, VLDL 14, cholesterol 172, HDL 79. Hemoglobin A1c 5.9. Cardiac enzymes negative. Normal TSH. Echo: LV function normal. Ejection fraction 50% to 55%. Left atrial enlargement, mild left ventricular hypertrophy   CONSULTATION: Cardiology consultation with Dr. Ubaldo Glassing.   HOSPITAL COURSE: Patient is a 80 year old female with history of  chronic respiratory failure due to chronic obstructive pulmonary disease and sleep apnea on CPAP and home O2, diabetes, hypertension, depression, anxiety who presented with shortness of breath. She was admitted with a diagnosis of acute on chronic hypoxic respiratory failure. Her differential diagnoses included PE, chronic obstructive pulmonary disease exacerbation and congestive heart failure. She was managed conservatively for her chronic obstructive pulmonary disease and remained stable. She was treated with Spiriva, Advair and respiratory treatments. She underwent an echo which was essentially normal and showed no evidence of systolic or diastolic dysfunction therefore congestive heart failure was felt to be less likely. She also underwent lower extremity Doppler's and CT chest. The Doppler showed no evidence of deep vein thrombosis and there was no PE on her CT chest. Her TSH was normal. Patient was presumed to have acute on chronic hypoxic respiratory failure due to chronic obstructive pulmonary disease and anxiety. Patient's diabetes remained well controlled. Her hemoglobin A1c is 5.6. Her hypertension was also well controlled during the hospitalization. On telemetry monitoring patient had intermittent second degree heart block. She was not on any rate slowing agents. Cardiology consultation with Dr. Ubaldo Glassing was obtained who recommended continued monitoring on telemetry. The last 24 hours patient has had no further episodes of second degree heart block and is currently asymptomatic. Dr. Ubaldo Glassing recommending discharging the patient home with Holter monitor and outpatient follow up with him. Home health PT and OT have also been arranged for the patient.   TIME SPENT: 45 minutes.  ____________________________ Cherre Huger, MD sp:cms D: 05/25/2011 16:19:50 ET T: 05/26/2011 10:18:05  ET  JOB#: [id number] cc: Youlanda Roys. Lovie Macadamia, MD Cherre Huger MD ELECTRONICALLY SIGNED 05/26/2011 15:42

## 2014-04-28 NOTE — Discharge Summary (Signed)
PATIENT NAME:  Kathryn Hayes, Kathryn Hayes MR#:  [id number] DATE OF BIRTH:  [birth year]  DATE OF ADMISSION:  01/07/2011 DATE OF DISCHARGE:  01/09/2011  ADMITTING PHYSICIAN: Dagoberto Ligas, MD  DISCHARGING PHYSICIAN: Gladstone Lighter, MD   PRIMARY CARE PHYSICIAN: Juluis Pitch, MD  PRIMARY PULMONOLOGIST: Wallene Huh, MD  PRIMARY ONCOLOGIST: Forest Gleason, MD    HOSPITAL CONSULTATIONS:  None.  DISCHARGE DIAGNOSES:  1. Chest tightness secondary to chronic obstructive pulmonary disease flare and acute bronchitis.  2. Chronic respiratory failure secondary to chronic obstructive pulmonary disease, on home oxygen.  3. Obstructive sleep apnea.  4. Diabetes mellitus, type 2.  5. Hypertension.  6. Hyperlipidemia.  7. Pulmonary hypertension.  8. Depression and anxiety.  9. History of breast cancer, status post lumpectomy and radiation.  10. Constipation.  11. History of colon cancer, status post partial colectomy.   DISCHARGE MEDICATIONS:  1. Tramadol 50 mg p.o. every 6 hours p.r.n.  2. Lorazepam 1 mg p.o. as needed for anxiety.  3. Protonix 40 mg p.o. daily.  4. Pravachol 40 mg p.o. at bedtime.  5. Lexapro 20 mg p.o. daily.  6. HCTZ 25 mg p.o. daily.  7. Aspirin 81 mg p.o. daily.  8. Trandolapril 4 mg p.o. daily.  9. Norvasc 10 mg p.o. daily.  10. Glimepiride  4 mg p.o. daily.  11. Metformin 750 mg p.o. b.i.d.  12. Cymbalta 60 mg  p.o. daily.  13. Advair 250/50, 1 puff b.i.d.  14. Spiriva 1 inhalation daily.  15. Prednisone taper.  16. Levaquin 500 mg p.o. daily until 01/14/2011.  17. Milk of magnesia 30 mL p.o. daily for constipation p.r.n.   DISCHARGE DIET: Low sodium, ADA diet.   DISCHARGE HOME OXYGEN: 3 liters nasal cannula.   DISCHARGE ACTIVITY: As tolerated.    FOLLOW-UP INSTRUCTIONS:  1. Primary care physician followup  in one week.  2. Follow up with Pulmonology in 1 to 2 weeks.  3. Metformin is supposed to be restarted from 01/11/2011.   LABORATORY, DIAGNOSTIC AND  RADIOLOGICAL DATA:  WBC 6.8, hemoglobin 11.4, hematocrit 34.8, platelet count 213.  Sodium 139, potassium 3.9, chloride 99, bicarbonate 30, BUN 10, creatinine 0.64, glucose of 92, calcium of 8.9.  Hemoglobin A1c of 6.4.  LDL cholesterol 61, HDL 54, triglycerides 35, total cholesterol 132.  Cardiac enzymes have remained negative while in the hospital.  BNP was slightly elevated at 878.  Chest x-ray showed hazy right lower lobe airspace disease, atelectasis versus developing infiltrate.  CT of the chest showing no evidence of any PE. Trace right pleural effusion and right basilar atelectasis.  Blood cultures have remained negative. Cardiac stress test showing negative dobutamine stress test, ejection fraction of 68%. Moderate area of anteroapical hypokinesis which is fixed consistent with scar versus soft tissue attenuation.  Urinalysis is negative.  Stool for occult blood is positive.   BRIEF HOSPITAL COURSE: Kathryn Hayes is a 80 year old elderly  obese Caucasian female with past medical history significant for chronic respiratory failure, oxygen-dependent chronic obstructive pulmonary disease with obstructive sleep apnea, who uses 3 to 5 liters of oxygen at home depending upon the activity and also CPAP, hypertension, diabetes, came to the Emergency Room complaining of severe chest  pressure, heaviness for almost a week.   1. Chest tightness, chest pain: She was admitted to telemetry. CT of the chest was negative for any PE, showed right basilar infiltrate versus atelectasis. She did have significant wheezing secondary to chronic obstructive pulmonary disease on examination. She also underwent a dobutamine  stress test which was negative, so cardiac causes were ruled out. After appropriately treating her with steroids and inhalers and nebulizer treatments, her chest tightness has improved and she felt a whole lot better. She was using about 3 to 4 liters of oxygen here which is about what she uses  at home. She was also advised to continue her CPAP. She was also recently diagnosed with pulmonary hypertension and follows up with Dr. Raul Del for the same. She is also being discharged on Levaquin for her bronchitis, prednisone taper, Spiriva and Advair.  2. Type 2 diabetes mellitus: She is on glimepiride and metformin at home. The metformin was held secondary to the CT with contrast that she had in the hospital, and she was advised to restart metformin two days after discharge.  3. Hypertension: Her home medications were continued, Norvasc and trandolapril.  4. Chronic constipation: She also suffered some chronic constipation and was also given lactulose in the hospital. She did not want to wait until she had a bowel movement. An enema was offered, too, but she said she would rather go home and have bowel movements than here.   Her hospital course has been otherwise uneventful in the hospital.   DISCHARGE CONDITION: Stable.   DISCHARGE DISPOSITION: Home.   TIME SPENT ON DISCHARGE: 40 minutes.  ____________________________ Gladstone Lighter, MD rk:cbb D: 01/11/2011 16:18:05 ET T: 01/12/2011 16:35:39 ET JOB#: [id number] cc: Youlanda Roys. Lovie Macadamia, MD Herbon E. Raul Del, MD Gladstone Lighter MD ELECTRONICALLY SIGNED 01/18/2011 11:55

## 2014-04-28 NOTE — H&P (Signed)
PATIENT NAME:  Kathryn, Hayes MR#:  [id number] DATE OF BIRTH:  1934-10-27  DATE OF ADMISSION:  05/22/2011  CHIEF COMPLAINT: Shortness of breath.   HISTORY OF PRESENT ILLNESS: Ms. Sand is a 80 year old morbidly obese white female with a history of chronic obstructive pulmonary disease O2 requiring since November 2012 and obstructive sleep apnea on CPAP with oxygen at night who presented to the Emergency Room with a three-day history of progressive dyspnea, chest heaviness, and dizziness with upright position. She has been seen by her pulmonologist within the last 48 hours and was started on furosemide and potassium for new onset edema secondary to presumed congestive heart failure. She was called by her pulmonologist this morning and told to go to the ER because of hyperkalemia. In the Emergency Room, she was noted to be short of breath with desaturations to 87% despite 4 liters of oxygen and has signs and symptoms consistent with congestive heart failure. The patient states that she has not had much cough over the last several days and denies any fevers and sick contacts. She lives alone and has a sedentary lifestyle, has been using a walker to ambulate, and has been progressively more short of breath over the last several days. No allergic rhinitis symptoms or sinus symptoms. Again no nausea, vomiting, or diarrhea and no productive cough.   She has a prior admission in November 2012 to [facility] which was her first hospitalization for acute respiratory failure. At that time, she underwent a Myoview, which was apparently normal, and this was done by Dr. Ubaldo Glassing.   Her primary care physician is Dr. Lovie Macadamia at [facility]. Her pulmonologist is Dr. Lake Bells of [facility].  PAST MEDICAL HISTORY:  1. Chronic obstructive pulmonary disease secondary to tobacco abuse diagnosed in December 2012 with respiratory failure.  2. Obstructive sleep apnea on CPAP with O2.  3. Diabetes mellitus on oral  medications.  4. Hypertension.  5. History of depression and generalized anxiety.  6. Chronic pain with degenerative joint disease of the knees, non-amenable to surgery due to her respiratory status.  7. History of breast cancer status post left breast lumpectomy in 2007 with XRT.  8. History of colon cancer status post resection in 1996.   MEDICATIONS:  1. Albuterol nebulizer every four hours as needed.  2. Amlodipine 10 mg daily. 3. Cymbalta 60 mg daily.  4. Aspirin 81 mg daily.  5. Lexapro 20 mg daily.  6. Advair 250/50 one puff twice daily.  7. Amaryl 4 mg daily.  8. Hydrochlorothiazide 25 mg daily.  9. Vicodin 5/500 mg one tablet every six hours as needed for pain. 10. Lorazepam 1 mg three times daily as needed for anxiety.  11. Glucophage XR 750 mg 1 tablet daily.  12. Pantoprazole 40 mg 1 tablet daily.  13. Actos 30 mg 1 tablet daily.  14. Pravastatin 40 mg 1 tablet at bedtime. 15. Spiriva 18 mcg 1 capsule nebulized daily.  16. Tramadol 50 mg every six hours as needed for pain. 17. Trandolapril 4 mg 1 tablet daily.   PAST SURGICAL HISTORY:  1. Tubal ligation in 1971. 2. Partial colectomy in 2009. 3. Left breast lumpectomy in 2007. 4. Bunionectomy, date unknown. 5. Remote dilation and curettage.   DRUG ALLERGIES: No known drug allergies.   LAST HOSPITALIZATION: November/December at [facility].   FAMILY HISTORY: Notable for mom who had colon cancer and breast cancer as well as a brain aneurysm and a deep vein thrombosis, died at age  73. Dad had end-stage kidney disease but died at age 39 with congestive heart failure.   SOCIAL HISTORY: She is divorced, has been living alone for over 30 years. She lives alone and uses a walker with wheels. She uses portable oxygen. She currently does not have a home health aide. She was previously getting some assistance from [place] but is in need of occupational therapy at home for dressing changes, bathing, and  meal preparation.  REVIEW OF SYSTEMS: Positive for fatigue, weakness, and chronic pain. No visual changes. No inflammation, glaucoma, or cataracts. No history of tinnitus, ear pain, hearing loss, or seasonal rhinitis. No history of sinus pain or postnasal drip. No difficulty swallowing. Positive for infrequent cough. Positive wheezing. Positive asthma. Positive chronic obstructive pulmonary disease. She denies chest pain but does have orthopnea, edema, and dyspnea with exertion. No history of palpitations or syncope. She does have high blood pressure. No history of nausea, vomiting, diarrhea, or abdominal pain. No changes in bowel habits. She denies any dysuria or urinary frequency. She does have a history of diabetes but no history of heat or cold intolerance. She has no history of anemia, easy bruising, or bleeding. She does have chronic bilateral knee pain and hip pain. She denies any history of numbness, weakness, ataxia, or seizures. No history of CVA or migraines. She does have a history of anxiety and depression.  PHYSICAL EXAMINATION:   GENERAL: This is a morbidly obese white female who is tachypneic but can speak in full sentences.  VITALS:  Initial blood pressure 184/67, pulse 53 and regular, temperature 97.7, respirations 26, and saturating 94% on 4 liters with standing up she desaturates to 87%. Weight is 265. She is 5 feet, 1 inch.  HEENT: Pupils are equal, round, and reactive to light. Extraocular movements are intact. Sclerae are anicteric. There is no conjunctivitis. Oropharynx is benign. Mucosa is within normal limits.   NECK: Supple. No lymphadenopathy. Difficult to appreciate JVD secondary to her size.   LUNGS: Decreased breath sounds bilaterally in all fields, some rales at the bases, and occasional expiratory wheezes are noted. She is slightly tachypneic.   CARDIOVASCULAR: Regular rate and rhythm. No murmur. Heart sounds are distant. No S3 or S4. Chest wall is nontender. She has  pedal pulses and she does have 1+ pitting edema.   BREASTS: Not examined.   ABDOMEN: Soft, nontender, and nondistended with good bowel sounds and no evidence of hepatosplenomegaly.   EXTREMITIES: She is moving all extremities, but strength was not tested due to respiratory issues.   SKIN: Warm and dry without rashes or lesions.   LYMPH: No cervical, axillary, inguinal, or supraclavicular lymphadenopathy.   NEUROLOGIC: Grossly nonfocal.   PSYCH: Alert and oriented to person, place, and time.   ADMISSION LABS/STUDIES: Sodium 138, potassium 4.3, chloride 98, bicarbonate 32, BUN 11, creatinine 0.76, and glucose 138. White count 5.0, hemoglobin 12.4, and platelets 190. Liver function tests are normal. CPK is 73, MB is 1.0, and troponin I is less than 0.02.   12-lead EKG shows sinus bradycardia with an incomplete right bundle. She does have T wave inversions in the lateral leads and there is no significant change since 01/07/2011 EKG.   PA and lateral chest x-ray shows bilateral interstitial opacities and cardiomegaly with pulmonary vascular redistribution and bilateral small effusions.   ASSESSMENT AND PLAN:  1. Hypoxic respiratory failure acute on chronic secondary to chronic obstructive pulmonary disease exacerbation and new onset congestive heart failure suspected. We will admit to  telemetry bed, cycle enzymes to rule out acute myocardial infarction, continue nitro paste and IV Lasix which has been started in the ED, and echocardiogram to evaluate ejection fraction.  2. Chronic obstructive pulmonary disease. Her symptoms currently appear to be more due to congestive heart failure than due to community-acquired pneumonia or chronic obstructive pulmonary disease exacerbation. We will continue her Advair and Spiriva at this point. I do not see that Solu-Medrol or high-dose steroids are necessary at this time. We will add these once we diurese her, if she is still wheezing.  3. Diabetes mellitus.  Unclear what her hemoglobin A1c is. We will check this in the morning. We will be discontinuing her Actos given her new onset congestive heart failure. We will continue glycemic management with Accu-Cheks. We will hold metformin in the event that she needs to have a contrasted study while she is here. We will continue Amaryl.   4. Hypertension: The patient currently takes amlodipine and trandolapril. We will stop the amlodipine as this may be contributing to her lower extremity edema.  ESTIMATED TIME OF CARE: 40 minutes. ____________________________ Deborra Medina, MD tt:slb D: 05/22/2011 15:19:02 ET T: 05/22/2011 15:39:19 ET JOB#: [id number]  cc: Deborra Medina, MD, <Dictator> Youlanda Roys. Lovie Macadamia, MD Deborra Medina MD ELECTRONICALLY SIGNED 05/26/2011 18:00

## 2014-04-30 ENCOUNTER — Inpatient Hospital Stay
Admit: 2014-04-30 | Disposition: A | Discharge status: Home / Self Care | Payer: Self-pay | Attending: Internal Medicine | Admitting: Internal Medicine

## 2014-04-30 LAB — BASIC METABOLIC PANEL
Anion Gap: 7 (ref 7–16)
BUN: 7 mg/dL
CALCIUM: 8.3 mg/dL — AB
Chloride: 96 mmol/L — ABNORMAL LOW
Co2: 39 mmol/L — ABNORMAL HIGH
Creatinine: 0.57 mg/dL
EGFR (African American): >60
Glucose: 103 mg/dL — ABNORMAL HIGH
Potassium: 3.6 mmol/L
Sodium: 142 mmol/L

## 2014-04-30 LAB — CBC
HCT: 37.1 % (ref 35.0–47.0)
HGB: 11.6 g/dL — ABNORMAL LOW (ref 12.0–16.0)
MCH: 27.5 pg (ref 26.0–34.0)
MCHC: 31.3 g/dL — AB (ref 32.0–36.0)
MCV: 88 fL (ref 80–100)
PLATELETS: 227 10*3/uL (ref 150–440)
RBC: 4.23 10*6/uL (ref 3.80–5.20)
RDW: 16.6 % — ABNORMAL HIGH (ref 11.5–14.5)
WBC: 6 10*3/uL (ref 3.6–11.0)

## 2014-04-30 LAB — TROPONIN I: Troponin-I: <0.03 ng/mL

## 2014-05-01 LAB — CBC WITH DIFFERENTIAL/PLATELET
BASOS PCT: 0.2 %
Basophil #: 0 10*3/uL (ref 0.0–0.1)
EOS PCT: 0 %
Eosinophil #: 0 10*3/uL (ref 0.0–0.7)
HCT: 37.1 % (ref 35.0–47.0)
HGB: 11.9 g/dL — ABNORMAL LOW (ref 12.0–16.0)
LYMPHS PCT: 11.9 %
Lymphocyte #: 0.7 10*3/uL — ABNORMAL LOW (ref 1.0–3.6)
MCH: 28.1 pg (ref 26.0–34.0)
MCHC: 31.9 g/dL — ABNORMAL LOW (ref 32.0–36.0)
MCV: 88 fL (ref 80–100)
MONOS PCT: 0.6 %
Monocyte #: 0 x10 3/mm — ABNORMAL LOW (ref 0.2–0.9)
Neutrophil #: 4.9 10*3/uL (ref 1.4–6.5)
Neutrophil %: 87.3 %
PLATELETS: 223 10*3/uL (ref 150–440)
RBC: 4.22 10*6/uL (ref 3.80–5.20)
RDW: 16.6 % — ABNORMAL HIGH (ref 11.5–14.5)
WBC: 5.7 10*3/uL (ref 3.6–11.0)

## 2014-05-01 LAB — BASIC METABOLIC PANEL
ANION GAP: 9 (ref 7–16)
BUN: 9 mg/dL
Calcium, Total: 8.3 mg/dL — ABNORMAL LOW
Chloride: 91 mmol/L — ABNORMAL LOW
Co2: 36 mmol/L — ABNORMAL HIGH
Creatinine: 0.64 mg/dL
EGFR (African American): >60
EGFR (Non-African Amer.): >60
GLUCOSE: 290 mg/dL — AB
Potassium: 3.6 mmol/L
SODIUM: 136 mmol/L

## 2014-05-02 LAB — CBC WITH DIFFERENTIAL/PLATELET
BASOS PCT: 0.5 %
Basophil #: 0 10*3/uL (ref 0.0–0.1)
EOS PCT: 0.2 %
Eosinophil #: 0 10*3/uL (ref 0.0–0.7)
HCT: 33.7 % — ABNORMAL LOW (ref 35.0–47.0)
HGB: 10.6 g/dL — AB (ref 12.0–16.0)
LYMPHS PCT: 23.9 %
Lymphocyte #: 1.3 10*3/uL (ref 1.0–3.6)
MCH: 27.3 pg (ref 26.0–34.0)
MCHC: 31.4 g/dL — ABNORMAL LOW (ref 32.0–36.0)
MCV: 87 fL (ref 80–100)
MONOS PCT: 8.6 %
Monocyte #: 0.5 x10 3/mm (ref 0.2–0.9)
NEUTROS PCT: 66.8 %
Neutrophil #: 3.5 10*3/uL (ref 1.4–6.5)
Platelet: 214 10*3/uL (ref 150–440)
RBC: 3.88 10*6/uL (ref 3.80–5.20)
RDW: 16.4 % — ABNORMAL HIGH (ref 11.5–14.5)
WBC: 5.3 10*3/uL (ref 3.6–11.0)

## 2014-05-02 LAB — CK TOTAL AND CKMB (NOT AT ARMC)
CK, Total: 35 U/L — ABNORMAL LOW
CK-MB: 2.8 ng/mL

## 2014-05-02 LAB — BASIC METABOLIC PANEL
Anion Gap: 6 — ABNORMAL LOW (ref 7–16)
BUN: 13 mg/dL
CHLORIDE: 92 mmol/L — AB
CREATININE: 0.49 mg/dL
Calcium, Total: 8.2 mg/dL — ABNORMAL LOW
Co2: 38 mmol/L — ABNORMAL HIGH
Glucose: 119 mg/dL — ABNORMAL HIGH
Potassium: 3.2 mmol/L — ABNORMAL LOW
Sodium: 136 mmol/L

## 2014-05-02 LAB — HEMOGLOBIN A1C: HEMOGLOBIN A1C: 5.9 %

## 2014-05-02 LAB — TROPONIN I: Troponin-I: <0.03 ng/mL

## 2014-05-03 LAB — BASIC METABOLIC PANEL
Anion Gap: 7 (ref 7–16)
BUN: 16 mg/dL
Calcium, Total: 8.5 mg/dL — ABNORMAL LOW
Chloride: 96 mmol/L — ABNORMAL LOW
Co2: 36 mmol/L — ABNORMAL HIGH
Creatinine: 0.5 mg/dL
EGFR (African American): >60
EGFR (Non-African Amer.): >60
Glucose: 119 mg/dL — ABNORMAL HIGH
POTASSIUM: 4.2 mmol/L
SODIUM: 139 mmol/L

## 2014-05-03 LAB — HEMOGLOBIN A1C: Hemoglobin A1C: 6 %

## 2014-05-03 LAB — CBC WITH DIFFERENTIAL/PLATELET
Basophil #: 0 10*3/uL (ref 0.0–0.1)
Basophil %: 0.2 %
EOS ABS: 0 10*3/uL (ref 0.0–0.7)
Eosinophil %: 0.1 %
HCT: 35.1 % (ref 35.0–47.0)
HGB: 11.1 g/dL — ABNORMAL LOW (ref 12.0–16.0)
LYMPHS ABS: 1.1 10*3/uL (ref 1.0–3.6)
Lymphocyte %: 18 %
MCH: 27.7 pg (ref 26.0–34.0)
MCHC: 31.7 g/dL — AB (ref 32.0–36.0)
MCV: 87 fL (ref 80–100)
MONOS PCT: 7.9 %
Monocyte #: 0.5 x10 3/mm (ref 0.2–0.9)
NEUTROS ABS: 4.5 10*3/uL (ref 1.4–6.5)
Neutrophil %: 73.8 %
Platelet: 234 10*3/uL (ref 150–440)
RBC: 4.02 10*6/uL (ref 3.80–5.20)
RDW: 16.8 % — ABNORMAL HIGH (ref 11.5–14.5)
WBC: 6.1 10*3/uL (ref 3.6–11.0)

## 2014-05-05 NOTE — Consult Note (Signed)
Chief Complaint:  Subjective/Chief Complaint Still short of breath still having trouble with COPD and congestion complains of persistent recurrent vertigo and dizziness denies any syncope.   VITAL SIGNS/ANCILLARY NOTES: **Vital Signs.:   21-Feb-16 05:16  Vital Signs Type Routine  Temperature Source oral  Pulse Pulse 36  Systolic BP Systolic BP 676  Diastolic BP (mmHg) Diastolic BP (mmHg) 54  Mean BP 79  Pulse Ox % Pulse Ox % 94  Oxygen Delivery Non-invasive ventilation (CPAP/BIPAP)  *Intake and Output.:   Daily 21-Feb-16 07:00  Grand Totals Intake:  820 Output:  2850    Net:  -2030 24 Hr.:  -2030  Oral Intake      In:  240  IV (Primary)      In:  480  IV (Secondary)      In:  100  Urine ml     Out:  2850  Length of Stay Totals Intake:  2580 Output:  4500    Net:  -1920   Brief Assessment:  GEN well developed, well nourished, no acute distress, obese   Cardiac Regular  murmur present   Respiratory normal resp effort  postive use of accessory muscles  rhonchi  crackles   Gastrointestinal Normal   Gastrointestinal details normal Soft  Nontender  Nondistended  No masses palpable   EXTR negative cyanosis/clubbing, negative edema   Lab Results: Routine Chem:  21-Feb-16 08:03   Glucose, Serum  163  BUN  25  Creatinine (comp) 1.04  Sodium, Serum  121  Potassium, Serum 5.0  Chloride, Serum  84  CO2, Serum 31  Calcium (Total), Serum  8.4  Anion Gap  6  Osmolality (calc) 252  eGFR (African American) >60  eGFR (Non-African American)  55 (eGFR values <80m/min/1.73 m2 may be an indication of chronic kidney disease (CKD). Calculated eGFR, using the MRDR Study equation, is useful in  patients with stable renal function. The eGFR calculation will not be reliable in acutely ill patients when serum creatinine is changing rapidly. It is not useful in patients on dialysis. The eGFR calculation may not be applicable to patients at the low and high extremes of body sizes,  pregnant women, and vegetarians.)   Radiology Results: XRay:    18-Feb-16 14:32, Chest Portable Single View  Chest Portable Single View   REASON FOR EXAM:    Shortness of Breath on bipap  COMMENTS:       PROCEDURE: DXR - DXR PORTABLE CHEST SINGLE VIEW  - Feb 21 2014  2:32PM     CLINICAL DATA:  Increasing shortness of Breath    EXAM:  PORTABLE CHEST - 1 VIEW    COMPARISON:  06/06/2013    FINDINGS:  The cardiac shadow remains enlarged. The lungs are well aerated  bilaterally although a right-sided pleural effusion is noted  posteriorly. No bony abnormality is noted.     IMPRESSION:  New right pleural effusion.      Electronically Signed    By: MInez CatalinaM.D.    On: 02/21/2014 14:56         Verified By: MEverlene Farrier M.D.,    19-Feb-16 13:13, Chest 1 View for Post Thoracentesis  Chest 1 View for Post Thoracentesis   REASON FOR EXAM:    post thoracentesis  COMMENTS:       [facility] 2014  1[facility]13[facility]     CLINICAL DATA:  80year old female status post right-sided  thoracentesis  EXAM:  CHEST XRAY 1 VIEW    COMPARISON:  Prior chest x-ray obtained yesterday 02/11/2014    FINDINGS:  Stable cardiomegaly. Atherosclerotic calcifications again noted in  the transverse aorta. No evidence of pneumothorax. The right-sided  pleural effusion has resolved. There is minimal residual right  basilar atelectasis. Persistent haziness in the left lung base  likely reflecting a small left layering pleural effusion and  associated lower lobe atelectasis. Pulmonary vascular congestion  without overt edema. No acute osseous abnormality.     IMPRESSION:  No evidence of pneumothorax status post right thoracentesis.    The right-sided pleural effusion has largely resolved.      Electronically Signed    By: Jacqulynn Cadet M.D.    On: 02/22/2014 14:23         Verified By: Criselda Peaches, M.D.,  [place]:    19-Feb-16 13:14, US  Guide Thoracentesis Right  US Guide Thoracentesis Right   REASON FOR EXAM:    sob, pleural effsusion  COMMENTS:       PROCEDURE: [place]  - US GUIDED THORACENTESIS RIGHT  - Feb 22 2014  1:14PM     CLINICAL DATA:  80 year old female with acute shortness of breath  and bilateral right larger than left pleural effusions    EXAM:  ULTRASOUND GUIDED RIGHT THORACENTESIS    COMPARISON:  Chest x-ray 02/21/2014    PROCEDURE:  An ultrasound guided thoracentesis was thoroughly discussed with the  patient and questions answered. The benefits, risks, alternatives  and complications were also discussed. The patient understands and  wishes to proceed with the procedure. Written consent was obtained.    Ultrasound was performed to localize and mark an adequate pocket of  fluid in the right chest. The area was then prepped and draped in  the normal sterile fashion. 1% Lidocaine was used for local  anesthesia. Under ultrasound guidance a Safe-T-Centesis catheter was  introduced. Thoracentesis was performed. The catheter was removed  and a dressing applied.    COMPLICATIONS:  None.    FINDINGS:  A total of approximately 800 mL of bloody pleural fluid was removed.  A fluid sample was sent for laboratory analysis.     IMPRESSION:  Successful ultrasound guided right thoracentesis yielding 800 mL of  bloody pleural fluid. Samples were sent for laboratory analysis.      Electronically Signed    By: Jacqulynn Cadet M.D.    On: 02/22/2014 14:28         Verified By: Criselda Peaches, M.D.,  Cardiology:    18-Feb-16 14:12, ED ECG  Ventricular Rate 40  Atrial Rate 40  P-R Interval 180  QRS Duration 112  QT 574  QTc 467  P Axis 36  R Axis 23  T Axis 30  ECG interpretation   Marked sinus bradycardia  Incomplete right bundle branch block  ST & T wave abnormality, consider anterior ischemia  Abnormal ECG  When compared with ECG of 22-May-2011 12:02,  No significant change was  found  ----------unconfirmed----------  Confirmed by OVERREAD, NOT ([id number]), editor PEARSON, BARBARA ([id number]) on 02/22/2014 10:11:28 AM  ED ECG     18-Feb-16 19:51, Echo Doppler  Echo Doppler   REASON FOR EXAM:      COMMENTS:       PROCEDURE: [facility] - ECHO DOPPLER COMPLETE(TRANSTHOR)  - Feb 21 2014  7:51PM     RESULT: Echocardiogram Report    Patient Name:   Kathryn Hayes Date of Exam: 02/21/2014  Medical  Rec #:  [id number]         Custom1:  Date of Birth:  Nov 20, 1934      Height:       64.0 in  Patient Age:    48 years       Weight:       190.0 lb  Patient Gender: F              BSA:          1.91 m??    Indications: MI  Sonographer:    Tikeshia Stills RDCS  Referring Phys: Myrtis Ser, P    Sonographer Comments: Technically difficult study due to poor echo   windows and suboptimal parasternal window.    Summary:   1. Left ventricular ejection fraction, by visual estimation, is 50 to   55%.   2. Mildly dilated left atrium.   3. Mildly dilated right atrium.   4. Moderately elevated pulmonary artery systolic pressure.  LV SYSTOLIC FUNCTION BY 2D PLANIMETRY (MOD):  EF-A4C View: 55.3 %  SPECTRAL DOPPLER ANALYSIS (where applicable):  Aortic Valve: AoV Max Vel: 1.52 m/s AoV Peak PG: 9.2 mmHg AoV Mean PG:  LVOT Vmax: 1.58 m/s LVOT VTI:  LVOT Diameter:  Tricuspid Valve and PA/RV Systolic Pressure: TR Max Velocity: 3.38 m/s RA   Pressure: 10 mmHg RVSP/PASP: 55.7 mmHg    PHYSICIAN INTERPRETATION:  Left Ventricle: Not well seen. LV septalwall thickness was normal. Left   ventricular ejection fraction, by visual estimation, is 50 to 55%.  Right Ventricle: The right ventricle was not well seen. The right   ventricular size is mildly enlarged. Global RV systolic function is low   normal.  Left Atrium: The left atrium is mildly dilated.  Right Atrium: The right atrium was not well visualized. The right atrium   is mildly dilated.  Pericardium: There is no evidence of pericardial  effusion.  Mitral Valve: The mitral valve is not well seen.  Tricuspid Valve: The tricuspid valve is not well seen. The tricuspid   regurgitant velocity is 3.38 m/s, and with an assumed right atrial   pressure of 10 mmHg, the estimated right ventricular systolic pressure is   moderately elevated at 55.7 mmHg.  Aortic Valve: The aortic valve was not well seen.    [place] MD  Electronically signed by [place] MD  Signature Date/Time: 02/22/2014/3:03:15 PM    *** Final ***    IMPRESSION: .      Verified By: Yolonda Kida, M.D., MD    19-Feb-16 08:20, ECG  Ventricular Rate 38  Atrial Rate 62  QRS Duration 108  QT 448  QTc 356  R Axis 85  T Axis 74  ECG interpretation   Third degree AVB with junctional escape rhythm.   Low voltage QRS  Nonspecific ST and T wave abnormality  Abnormal ECG  When compared with ECG of 21-Feb-2014 14:12,  Third degree AVB replaced sinus bradycardia      Confirmed by Fletcher Anon, MUHAMMAD ([id number]) on 02/22/2014 12:51:39 PM    Overreader: Kathlyn [place]  ECG    Assessment/Plan:  Assessment/Plan:  Assessment sick sinus syndrome  bradycardia  COPD  vertigo  obesity  respiratory failure  shortness of breath  diabetes  hypertension  hyperlipidemia  obstructive sleep apnea  reflux  hypoxemia .   Plan continue supplemental oxygen  agree with inhalers  continue steroid therapy  agree with pulmonary consultation input  CPAP therapy  low-dose Lasix for heart failure  broad spectrum antibiotic therapy  recommend permanent pacemaker therapy  continue diabetes therapy and management  continue blood pressure control Once COPD and history failure controlled then recommend permanent pacemaker placement   Electronic Signatures: Yolonda Kida (MD)  (Signed 21-Feb-16 12:04)  Authored: Chief Complaint, VITAL SIGNS/ANCILLARY NOTES, Brief Assessment, Lab Results, Radiology Results, Assessment/Plan   Last Updated:  21-Feb-16 12:04 by Lujean Amel D (MD)

## 2014-05-05 NOTE — Consult Note (Signed)
Chief Complaint:  Subjective/Chief Complaint Patient is off BiPAP now she was on it pretty much all day yesterday she set up on oxygen in getting inhalers now denies any chest pain still congested discuss with from yesterday.   VITAL SIGNS/ANCILLARY NOTES: **Vital Signs.:   23-Feb-16 04:33  Vital Signs Type Routine  Temperature Temperature (F) 98.3  Celsius 36.8  Temperature Source oral  Pulse Pulse 32  Respirations Respirations 20  Systolic BP Systolic BP 941  Diastolic BP (mmHg) Diastolic BP (mmHg) 53  Mean BP 81  Pulse Ox % Pulse Ox % 93  Pulse Ox Activity Level  At rest  Oxygen Delivery Non-invasive ventilation (CPAP/BIPAP)  *Intake and Output.:   Daily 23-Feb-16 07:00  Grand Totals Intake:  50 Output:  4300    Net:  -4250 24 Hr.:  -4250  IV (Secondary)      In:  50  Urine ml     Out:  4300  Length of Stay Totals Intake:  3470 Output:  12900    Net:  -9430   Brief Assessment:  GEN well developed, well nourished, no acute distress, obese   Cardiac Regular  murmur present   Respiratory normal resp effort  postive use of accessory muscles  rhonchi  crackles   Gastrointestinal Normal   Gastrointestinal details normal Soft  Nontender  Nondistended  No masses palpable   EXTR negative cyanosis/clubbing, negative edema   Lab Results: Routine Chem:  23-Feb-16 00:35   Glucose, Serum  117  BUN  22  Creatinine (comp) 0.91  Sodium, Serum  129  Potassium, Serum 4.2  Chloride, Serum  89  CO2, Serum  34  Calcium (Total), Serum  8.0  Anion Gap  6  Osmolality (calc) 263  eGFR (African American) >60  eGFR (Non-African American) >60 (eGFR values <29m/min/1.73 m2 may be an indication of chronic kidney disease (CKD). Calculated eGFR, using the MRDR Study equation, is useful in  patients with stable renal function. The eGFR calculation will not be reliable in acutely ill patients when serum creatinine is changing rapidly. It is not useful in patients on dialysis. The  eGFR calculation may not be applicable to patients at the low and high extremes of body sizes, pregnant women, and vegetarians.)  Osmolality, Blood  271 (Result(s) reported on 26 Feb 2014 at 01:39AM.)  Magnesium, Serum  2.8 (1.8-2.4 THERAPEUTIC RANGE: 4-7 mg/dL TOXIC: > 10 mg/dL  -----------------------)   Radiology Results: XRay:    18-Feb-16 14:32, Chest Portable Single View  Chest Portable Single View   REASON FOR EXAM:    Shortness of Breath on bipap  COMMENTS:       PROCEDURE: DXR - DXR PORTABLE CHEST SINGLE VIEW  - Feb 21 2014  2:32PM     CLINICAL DATA:  Increasing shortness of Breath    EXAM:  PORTABLE CHEST - 1 VIEW    COMPARISON:  06/06/2013    FINDINGS:  The cardiac shadow remains enlarged. The lungs are well aerated  bilaterally although a right-sided pleural effusion is noted  posteriorly. No bony abnormality is noted.     IMPRESSION:  New right pleural effusion.      Electronically Signed    By: MInez CatalinaM.D.    On: 02/21/2014 14:56         Verified By: MEverlene Farrier M.D.,    19-Feb-16 13:13, Chest 1 View for Post Thoracentesis  Chest 1 View for Post Thoracentesis   REASON FOR EXAM:    post  thoracentesis  COMMENTS:       PROCEDURE: DXR - DXR CHEST 1 VIEW POST [facility]  - Feb 22 2014  1:13PM     CLINICAL DATA:  80 year old female status post right-sided  thoracentesis    EXAM:  CHEST XRAY 1 VIEW    COMPARISON:  Prior chest x-ray obtained yesterday 02/11/2014    FINDINGS:  Stable cardiomegaly. Atherosclerotic calcifications again noted in  the transverse aorta. No evidence of pneumothorax. The right-sided  pleural effusion has resolved. There is minimal residual right  basilar atelectasis. Persistent haziness in the left lung base  likely reflecting a small left layering pleural effusion and  associated lower lobe atelectasis. Pulmonary vascular congestion  without overt edema. No acute osseous abnormality.     IMPRESSION:  No evidence  of pneumothorax status post right thoracentesis.    The right-sided pleural effusion has largely resolved.      Electronically Signed    By: Jacqulynn Cadet M.D.    On: 02/22/2014 14:23         Verified By: Criselda Peaches, M.D.,    [phone] 20:54, Chest Portable Single View  Chest Portable Single View   REASON FOR EXAM:    pnemmonia/chf  COMMENTS:       PROCEDURE: DXR - DXR PORTABLE CHEST SINGLE VIEW  - Feb 25 2014  8:54PM     CLINICAL DATA:  Evaluate pneumonia/ CHF. Bronchitis. Shortness of  breath on CPAP machine.    EXAM:  PORTABLE CHEST - 1 VIEW    COMPARISON:  02/1914    FINDINGS:  Cardiac enlargement with mild pulmonary vascular congestion.  Probable small bilateral pleural effusions. Infiltration or  atelectasis in both lung bases appears to be increasing since prior  study. No pneumothorax. Calcification of aorta.     IMPRESSION:  Cardiac enlargement with mild pulmonary vascular congestion. Small  pleural effusions bilaterally. Bilateral basilar infiltration or  atelectasis, worse on the right, increasing since prior study.      Electronically Signed    By: Lucienne Capers M.D.    On: 02/25/2014 21:17       Verified By: Neale Burly, M.D.,  [place]:    19-Feb-16 13:14, US Guide Thoracentesis Right  US Guide Thoracentesis Right   REASON FOR EXAM:    sob, pleural effsusion  COMMENTS:       PROCEDURE: [place]  - US GUIDED THORACENTESIS RIGHT  - Feb 22 2014  1:14PM     CLINICAL DATA:  80 year old female with acute shortness of breath  and bilateral right larger than left pleural effusions    EXAM:  ULTRASOUND GUIDED RIGHT THORACENTESIS    COMPARISON:  Chest x-ray 02/21/2014    PROCEDURE:  An ultrasound guided thoracentesis was thoroughly discussed with the  patient and questions answered. The benefits, risks, alternatives  and complications were also discussed. The patient understands and  wishes to proceed with the procedure. Written consent  was obtained.    Ultrasound was performed to localize and mark an adequate pocket of  fluid in the right chest. The area was then prepped and draped in  the normal sterile fashion. 1% Lidocaine was used for local  anesthesia. Under ultrasound guidance a Safe-T-Centesis catheter was  introduced. Thoracentesis was performed. The catheter was removed  and a dressing applied.    COMPLICATIONS:  None.    FINDINGS:  A total of approximately 800 mL of bloody pleural fluid was removed.  A fluid sample was sent for laboratory analysis.  IMPRESSION:  Successful ultrasound guided right thoracentesis yielding 800 mL of  bloody pleural fluid. Samples were sent for laboratory analysis.      Electronically Signed    By: Jacqulynn Cadet M.D.    On: 02/22/2014 14:28         Verified By: Criselda Peaches, M.D.,  Cardiology:    18-Feb-16 14:12, ED ECG  Ventricular Rate 40  Atrial Rate 40  P-R Interval 180  QRS Duration 112  QT 574  QTc 467  P Axis 36  R Axis 23  T Axis 30  ECG interpretation   Marked sinus bradycardia  Incomplete right bundle branch block  ST & T wave abnormality, consider anterior ischemia  Abnormal ECG  When compared with ECG of 22-May-2011 12:02,  No significant change was found  ----------unconfirmed----------  Confirmed by OVERREAD, NOT ([id number]), editor PEARSON, BARBARA ([id number]) on 02/22/2014 10:11:28 AM  ED ECG     18-Feb-16 19:51, Echo Doppler  Echo Doppler   REASON FOR EXAM:      COMMENTS:       PROCEDURE: [facility] - ECHO DOPPLER COMPLETE(TRANSTHOR)  - Feb 21 2014  7:51PM     RESULT: Echocardiogram Report    Patient Name:   Kathryn Hayes Date of Exam: 02/21/2014  Medical Rec #:  [id number]         Custom1:  Date of Birth:  04/25/1934      Height:       64.0 in  Patient Age:    80 years       Weight:       190.0 lb  Patient Gender: F              BSA:          1.91 m??    Indications: MI  Sonographer:    Arville Go RDCS  Referring Phys: Myrtis Ser, P    Sonographer Comments: Technically difficult study due to poor echo   windows and suboptimal parasternal window.    Summary:   1. Left ventricular ejection fraction, by visual estimation, is 50 to   55%.   2. Mildly dilated left atrium.   3. Mildly dilated right atrium.   4. Moderately elevated pulmonary artery systolic pressure.  LV SYSTOLIC FUNCTION BY 2D PLANIMETRY (MOD):  EF-A4C View: 55.3 %  SPECTRAL DOPPLER ANALYSIS (where applicable):  Aortic Valve: AoV Max Vel: 1.52 m/s AoV Peak PG: 9.2 mmHg AoV Mean PG:  LVOT Vmax: 1.58 m/s LVOT VTI:  LVOT Diameter:  Tricuspid Valve and PA/RV Systolic Pressure: TR Max Velocity: 3.38 m/s RA   Pressure: 10 mmHg RVSP/PASP: 55.7 mmHg    PHYSICIAN INTERPRETATION:  Left Ventricle: Not well seen. LV septalwall thickness was normal. Left   ventricular ejection fraction, by visual estimation, is 50 to 55%.  Right Ventricle: The right ventricle was not well seen. The right   ventricular size is mildly enlarged. Global RV systolic function is low   normal.  Left Atrium: The left atrium is mildly dilated.  Right Atrium: The right atrium was not well visualized. The right atrium   is mildly dilated.  Pericardium: There is no evidence of pericardial effusion.  Mitral Valve: The mitral valve is not well seen.  Tricuspid Valve: The tricuspid valve is not well seen. The tricuspid   regurgitant velocity is 3.38 m/s, and with an assumed right atrial   pressure of 10 mmHg, the estimated right ventricular systolic pressure is   moderately elevated  at 55.7 mmHg.  Aortic Valve: The aortic valve was not well seen.    Clay MD  Electronically signed by Lawler MD  Signature Date/Time: 02/22/2014/3:03:15 PM    *** Final ***    IMPRESSION: .      Verified By: Yolonda Kida, M.D., MD    19-Feb-16 08:20, ECG  Ventricular Rate 38  Atrial Rate 62  QRS Duration 108  QT 448  QTc 356  R Axis 85  T Axis 74   ECG interpretation   Third degree AVB with junctional escape rhythm.   Low voltage QRS  Nonspecific ST and T wave abnormality  Abnormal ECG  When compared with ECG of 21-Feb-2014 14:12,  Third degree AVB replaced sinus bradycardia      Confirmed by Fletcher Anon, MUHAMMAD ([id number]) on 02/22/2014 12:51:39 PM    Overreader: Kathlyn Sacramento  ECG     21-Feb-16 01:39, ECG  Ventricular Rate 38  Atrial Rate 277  QRS Duration 122  QT 564  QTc 448  R Axis 4  T Axis 109  ECG interpretation   Sinus rhythm with complete heart block and ventricular escape rhythm.   Abnormal ECG  When compared with ECG of 22-Feb-2014 08:20,  No significant change was found  Confirmed by ARIDA, MUHAMMAD ([id number]) on 02/25/2014 9:48:32 AM    Overreader: Kathlyn Sacramento  ECG     22-Feb-16 06:14, ECG  Ventricular Rate 35  Atrial Rate 97  QRS Duration 124  QT 546  QTc 416  P Axis 45  R Axis 71  T Axis 23  ECG interpretation   sinus rhythm with complete heart block and ventricular escape rhythm.   Abnormal ECG      Confirmed by Fletcher Anon, MUHAMMAD ([id number]) on 02/25/2014 9:46:47 AM    Overreader: Kathlyn Sacramento  ECG    Assessment/Plan:  Assessment/Plan:  Assessment respiratory failure COPD with exacerbation sick sinus syndrome  bradycardia  COPD  vertigo  obesity  shortness of breath  diabetes  hypertension  hyperlipidemia  obstructive sleep apnea  reflux  hypoxemia .   Plan continue aggressive respiratory support and treatment  status post significant thoracentesis continue supplemental oxygen  agree with inhalers  continue steroid therapy  agree with pulmonary consultation input  CPAP therapy  low-dose Lasix for heart failure  broad spectrum antibiotic therapy  recommend permanent pacemaker therapy  continue diabetes therapy and management  continue blood pressure control Once COPD and history failure controlled then recommend permanent pacemaker placement   Electronic Signatures: Yolonda Kida (MD)  (Signed 23-Feb-16 11:18)  Authored: Chief Complaint, VITAL SIGNS/ANCILLARY NOTES, Brief Assessment, Lab Results, Radiology Results, Assessment/Plan   Last Updated: 23-Feb-16 11:18 by Lujean Amel D (MD)

## 2014-05-05 NOTE — Op Note (Signed)
PATIENT NAME:  Kathryn Hayes, ISSAC MR#:  [id number] DATE OF BIRTH:  10-18-1934  DATE OF PROCEDURE:  02/28/2014  PRIMARY CARE PHYSICIAN: Youlanda Roys. Bronstein, MD  PREPROCEDURE DIAGNOSIS: Intermittent complete heart block and junctional rhythm.   PROCEDURE: Dual-chamber pacemaker implantation.  POSTPROCEDURE DIAGNOSIS: Atrial sensing with ventricular pacing.   INDICATION: The patient is a 80 year old female with end-stage COPD on chronic oxygen therapy who was admitted on 02/21/2014 with worsening shortness of breath and acute generalized weakness. The patient was noted to be bradycardic, with heart rates in the 30s and 40s. On telemetry the patient has had intermittent bradycardia with episodes of AV dissociation consistent with complete heart block, as well as junctional escape rhythm in the 30s and 40s. The procedure, risks, benefits and alternatives of permanent pacemaker implantation were explained to the patient and informed written consent was obtained.   DESCRIPTION OF PROCEDURE: She was brought to the operating room in a fasting state. The left pectoral region was prepped and draped in the usual sterile manner. Anesthesia was obtained with 1% Xylocaine locally. A 6 cm incision was performed over the left pectoral region. The pacemaker pocket was generated by electrocautery and blunt dissection. Access was obtained to the left subclavian vein by fine needle aspiration. Ventricular and atrial MRI-compatible leads were positioned to right ventricular apical septum and right atrial appendage under fluoroscopic guidance. After proper thresholds were obtained, the leads were sutured in place. The pacemaker leads were connected to a new dual-chamber rate-responsive pacemaker generator (Medtronic [id number] Advisa). The pacemaker pocket was irrigated with gentamicin solution. The pacemaker generator was positioned in the pocket. The pocket was closed with 2-0 and 4-0 Vicryl, respectively. Steri-Strips and a  pressure dressing were applied.    ____________________________ Isaias Cowman, MD ap:ST D: 02/28/2014 14:25:32 ET T: 03/01/2014 01:23:13 ET JOB#: [id number]  cc: Isaias Cowman, MD, <Dictator> Isaias Cowman MD ELECTRONICALLY SIGNED 03/12/2014 10:08

## 2014-05-05 NOTE — H&P (Signed)
PATIENT NAME:  Kathryn Hayes, Kathryn Hayes MR#:  [id number] DATE OF BIRTH:  1934/02/03  DATE OF ADMISSION:  02/21/2014  REFERRING EMERGENCY ROOM PHYSICIAN: Carrie Mew, MD  PRIMARY CARE PHYSICIAN: Juluis Pitch, MD   PRIMARY CARDIOLOGIST: Bartholome Bill, MD  PULMONOLOGIST: Simonne Maffucci, MD   CHIEF COMPLAINT: Shortness of breath and weakness.   HISTORY OF PRESENT ILLNESS: This 80 year old woman with past medical history of COPD, chronic respiratory failure on 3 to 4 liters of oxygen at home, obstructive apnea on CPAP, diabetes, hypertension, and hyperlipidemia presents with complaint of worsening shortness of breath and acute weakness this morning. She reports that she woke up this morning, took off her CPAP as usual and when she tried to get out of bed her body "locked up". She states that she could not move her arms or legs and was unable to call out for help. She states that she had no strength whatsoever. She remained like this for about 1 hour until she was finally able to get to the phone and call EMS. She also felt very short of breath during that time, unable to talk and with a very dry mouth. She denies any chest pain. She reports that for the past few days she has had a cold with cough productive of white sputum and a dry throat. She has been taking prescription cough medication, possibly Tussionex. On presentation to the Emergency Room, she was acutely hypoxic with increased work of breathing and requiring BiPAP to keep oxygenation in the 90s. She improved with Solu-Medrol and nebulizer treatments and is now back on 4 liters nasal cannula with oxygen saturations in the 90s. She is also noted to be bradycardic with heart rate in the 30s to 40s. She states that her baseline heart rate is usually in the 50s. She is not on any labetalol blockers.   PAST MEDICAL HISTORY: 1.  Diabetes mellitus type 2.  2.  Hypertension.  3.  Hyperlipidemia.  4.  COPD. 5.  GERD.  6.  Anxiety and depression.  7.   Colon cancer status post resection in 1996.  8.  Chronic respiratory failure, on oxygen since 2012.  9.  Obstructive sleep apnea, on CPAP.  10.  Breast cancer status post lumpectomy and radiation in 2007.  PAST SURGICAL HISTORY: 1.  Colon surgery in 1996.  2.  Breast surgery, left breast, in 2007.  3.  Right total knee arthroplasty.  4.  Bilateral tubal ligation.  SOCIAL HISTORY: The patient lives at home by herself. She uses a walker for ambulation. She is on 3 to 4 liters of oxygen at all times. She has a CPAP machine. She does not smoke cigarettes currently. She drinks alcohol rarely. She is a retired [employer] manager.   FAMILY MEDICAL HISTORY: Positive for mother with colon and breast cancer as well as DVT. Father had end-stage renal disease and congestive heart failure.   ALLERGIES: No known allergies.   HOME MEDICATIONS: 1.  Vitamin B12 1,000 mcg 1 tablet once a day.  2.  Trazodone 50 mg 2 tablets once a day at bedtime.  3.  Trandolapril 4 mg 1 tablet once a day in the morning.  4.  Tramadol 50 mg 1 to 2 tablets every 4 hours as needed for pain.  5.  Spiriva 18 mcg/inhalation 1 capsule inhaled once a day.  6.  Pravastatin 40 mg 1 tablet once a day at bedtime.  7.  Pantoprazole 40 mg 1 tablet twice a day.  8.  Naproxen  500 mg 1 tablet 2 times a day with meals.  9.  Metoclopramide 5 mg 1 tablet 3 times a day with meals.  10.  Metformin 750 mg 1 tablet once a day in the morning.  11.  Magnesium oxide 400 mg 2 tablets twice a day.  12.  Lorazepam 1 mg 1 tablet 3 times a day as needed for anxiety.  13.  Glimepiride 4 mg 1 tablet once a day in the morning.  14.  Gabapentin 400 mg 1 capsule 3 times a day.  15.  Furosemide 40 mg 1 tablet once a day in the morning.  16.  Ferrous sulfate 325 one tablet once a day in the morning.  17.  Escitalopram 20 mg 1 tablet once a day in the morning.  18.  Cymbalta 60 mg 1 capsule once a day.  19.  Amlodipine 10 mg 1 tablet once a day.  20.   Albuterol ipratropium 2.5 mg/0.5 mg in 3 mL, 3 mL inhaled every 6 hours as needed for shortness of breath.  21.  Actos 30 mg 1 tablet once a day in the morning.   REVIEW OF SYSTEMS: CONSTITUTIONAL: Positive for some subjective chills and fevers and fatigue with upper respiratory tract infection. Positive for weakness. No new pain. No change in weight.  HEENT: No change in vision or hearing. She does have decreased vision chronically. No pain in the eyes or ears. She does have some sinus congestion, postnasal drip and sore throat. No difficulty swallowing.  RESPIRATORY: Positive for cough with white sputum. Some wheezing. No hemoptysis. Positive for shortness of breath. No painful respirations. She does have a history of COPD. CARDIOVASCULAR: No chest pain, orthopnea, edema or palpitations. No syncope.  GASTROINTESTINAL: No nausea, vomiting, diarrhea, or abdominal pain.  GENITOURINARY: No dysuria or frequency.  ENDOCRINE: No polyuria, polydipsia, or hot or cold intolerance.  HEMATOLOGIC: No anemia, easy bruising or bleeding.  MUSCULOSKELETAL: No new pain in the neck, back, knees, shoulders, or hips. She does have osteoarthritis.  NEUROLOGIC: No focal numbness or weakness. No dementia, headache, seizure, or history of stroke.  PSYCHIATRIC: She does have a history of anxiety and depression. No schizophrenia or bipolar disorder.   PHYSICAL EXAMINATION: VITAL SIGNS: Temperature not measured in the ED, will get this on the floor. Pulse between 34 to 40, respirations 18, blood pressure 136/57 and oxygenation 96% on 4 liters nasal cannula.  GENERAL: No acute distress.  HEENT: Pupils equal, round and reactive to light. Conjunctivae clear. Extraocular motion intact. Oral mucous membranes are dry. Poor dentition. Posterior oropharynx is slightly erythematous. No edema or exudate.  NECK: Supple. No cervical lymphadenopathy. Trachea is midline. Thyroid nontender.  RESPIRATORY: Good air movement, scattered  wheezes. Some coughing during examination. No sputum produced.  CARDIOVASCULAR: Bradycardic. No murmurs, rubs or gallops. She has trace pitting edema on the right lower extremity, none on the left. Peripheral pulses are 1+.  ABDOMEN: Distended. No hepatosplenomegaly or mass. No guarding or rebound. Bowel sounds are decreased.  MUSCULOSKELETAL: No joint effusions. No tender swollen joints. Range of motion normal. Strength 4/4 throughout.  NEUROLOGIC: Cranial nerves II through XII grossly intact. Strength and sensation are intact, nonfocal.  SKIN: No new rashes, lesions or wounds.  PSYCHIATRIC: She is alert and oriented with good insight into her clinical condition. No signs of uncontrolled depression or anxiety.   DIAGNOSTIC DATA: Labs: Sodium 138, potassium 4.2, chloride 97, bicarb 37, BUN 14, creatinine 1.01, glucose 151. BNP 467. calcium 8.2, total protein  6.3, albumin 3. LFTs are normal. CK 33, CK-MB 0.6, troponin less than 0.02. White blood cells 8.3, hemoglobin 10.4, platelets 204,000, MCV 90.   Imaging: Chest x-ray shows a new right pleural effusion. Lungs are well aerated bilaterally, although right-sided pleural effusion noted posteriorly.   ASSESSMENT AND PLAN: 1.  Bradycardia with heart rate 35 to 45 in the Emergency Room: EKG shows sinus bradycardia. We will cycle cardiac enzymes and get a 2-D echocardiogram. We will place external pacing pads on the patient prior to moving to the floor. We will have atropine at the bedside. ED physician, Dr. Joni Fears, has called [facility] cardiology for evaluation prior to moving the patient to the floor. She is not on any antiarrhythmic medications as an outpatient.  2.  Chronic obstructive pulmonary disease exacerbation: Much improved at this time after Solu-Medrol 125 mg and nebulizer treatments x3. We will continue with steroids, nebulizers, antibiotics and supplemental oxygen.  3.  Pleural effusion: This is new and has not been seen on prior  x-rays. She is not having any right-sided chest pain and there is no evidence of other pneumonia on chest x-ray. She does have  significant pulmonary venous congestion. Will get a 2-D echocardiogram. This could be congestive heart failure related. She may need thoracentesis for further evaluation of this effusion. She does have a history of breast cancer with lumpectomy on the left in 2007.  4.  Diabetes mellitus, type 2: Check a hemoglobin A1c. Hold oral hypoglycemic agents in hospital and start on sliding scale insulin.  5.  Gastroesophageal reflux disease: Continue proton pump inhibitor. 6.  Anxiety: Continue home regimen. She is calm at this time.  7.  Obstructive sleep apnea: CPAP while in hospital.  8.  Prophylaxis: Heparin for deep vein thrombosis prophylaxis. 9. acute respiratory failure with hypercarbia and hypxia: note that presenting ABG with his CO2. Now satting well off bipap and on [place].  GOALS OF CARE: The patient would like to be a FULL code.   TIME SPENT ON ADMISSION: 45 minutes. ____________________________ Earleen Newport. Volanda Napoleon, MD cpw:sb D: 02/21/2014 16:47:53 ET T: 02/21/2014 17:05:09 ET JOB#: [id number]  cc: Barnetta Chapel P. Volanda Napoleon, MD, <Dictator> Aldean Jewett MD ELECTRONICALLY SIGNED 02/21/2014 23:29

## 2014-05-05 NOTE — H&P (Signed)
PATIENT NAME:  Kathryn Hayes, Kathryn Hayes MR#:  [id number] DATE OF BIRTH:  1934-10-27  DATE OF ADMISSION:  05/01/2014  REFERRING PHYSICIAN: Larae Grooms, MD    PRIMARY CARE PHYSICIAN: Kathryn Roys. Lovie Macadamia, MD    CHIEF COMPLAINT: Shortness of breath.   HISTORY OF PRESENT ILLNESS: This is a 80 year old Caucasian female with a past medical history of COPD with chronic respiratory failure, on 4 liters nasal cannula at baseline, obstructive sleep apnea requiring CPAP at nighttime, and presenting with shortness of breath. She describes 2 week total duration of shortness of breath, mainly dyspnea on exertion, with production cough of yellow to greenish sputum. Saw her PCP for the above symptoms, was then given a course of Levaquin, which she has finished about 4 to 5 days ago. Unfortunately has had no improvement in the symptoms, thus presented to the hospital for further workup and evaluation.   REVIEW OF SYSTEMS:   CONSTITUTIONAL: Denies fevers, chills. Positive for fatigue, weakness.  EYES: Denies blurred vision, double vision, or eye pain.  EARS, NOSE, THROAT: Denies tinnitus, ear pain, hearing loss.  RESPIRATORY:  Positive for cough, shortness of breath as stated above.    CARDIOVASCULAR: Denies chest pain, palpitations, edema.  GASTROINTESTINAL: Denies nausea, vomiting, diarrhea, abdominal pain.  GENITOURINARY: Denies dysuria, hematuria.  ENDOCRINE: Denies nocturia or thyroid problems. HEMATOLOGIC AND LYMPHATIC:  Denies easy bruising or bleeding.  SKIN: Denies rash or lesions.  MUSCULOSKELETAL: Denies pain in neck, back, shoulder, knees, hips, or arthritic symptoms.  NEUROLOGIC: Denies paralysis, paresthesias.  PSYCHIATRIC: Denies anxiety or depressive symptoms.  Otherwise full review of systems performed by me is negative.    PAST MEDICAL HISTORY: Include COPD, chronic respiratory failure, on 4 liters nasal cannula at baseline, obstructive sleep apnea requiring CPAP therapy at nighttime,  gastroesophageal reflux disease without esophagitis, hyperlipidemia, unspecified, hypertension, essential, type 2 diabetes, non-insulin-requiring, currently off of all medications.   SOCIAL HISTORY: Denies any tobacco use. Positive for occasional alcohol use. Uses a walker at baseline.   FAMILY HISTORY: Positive for congestive heart failure.   ALLERGIES: No known drug allergies.   HOME MEDICATIONS: Include tramadol 50 mg p.o. every 4 hours as needed for pain, gabapentin 400 mg p.o. 3 times daily, lorazepam 1 mg p.o. 3 times daily as needed for anxiety, Cymbalta 60 mg p.o. q. daily, escitalopram 20 mg p.o. q. daily, trazodone 50 mg 2 tablets at bedtime, Reglan 5 mg 3 times daily with meals, pravastatin 40 mg daily, metoprolol 25 mg p.o. b.i.d., Advair 250/50 mcg inhalation, albuterol nebulizer treatments q. 4 hours as needed, Spiriva 2 puffs daily, Lasix 20 mg daily, Feosol 325 mg daily, Colace 100 mg b.i.d., Carafate 1 gram 4 times daily, pantoprazole 40 mg b.i.d., vitamin B12 1000 mcg p.o. q. daily.   PHYSICAL EXAMINATION:  VITAL SIGNS: Temperature 97.8, heart rate 101, respirations 20, blood pressure 160/84, saturating 96% on supplemental O2, weight 103.3 kg, BMI of 46.1.  GENERAL: Obese Caucasian female currently in no acute distress.  HEAD: Normocephalic, atraumatic.  EYES: Pupils equal, round, reactive to light. Extraocular muscles intact. No scleral icterus.  MOUTH: Moist mucosal membrane. Dentition poor. No abscess noted.  EAR, NOSE, THROAT: Clear without exudates. No external lesions.  NECK: Supple. No thyromegaly. No nodules. No JVD.  PULMONARY: Expiratory wheezing heard most prominently in the left upper lobe with some bibasilar coarse rhonchi.  Tachypneic without use of accessory muscles. Good respiratory effort.  CHEST: Nontender to palpation.  CARDIOVASCULAR: S1 and S2. Regular rate and rhythm. No murmurs,  rubs, or gallops. One plus edema in the lower extremities to the shins  bilaterally. Pedal pulses 2+ bilaterally.   GASTROINTESTINAL: Soft, nontender, nondistended. No masses. Positive bowel sounds. No hepatosplenomegaly.  MUSCULOSKELETAL: No swelling, clubbing, or edema. Range of motion full in all extremities.  NEUROLOGIC: Cranial nerves II through XII are intact. No gross focal neurological deficits. Sensation intact. Reflexes intact.  SKIN: No ulceration, lesions, rashes, or cyanosis. Skin warm and dry. Turgor intact.  PSYCHIATRIC: Mood, affect within normal limits. The patient is awake, alert and oriented x 3. Insight, judgment intact.   LABORATORY DATA: Chest x-ray performed, which reveals underlying emphysema, small pleural effusions with mild bibasilar atelectasis.    Remainder of laboratory data: Sodium 142, potassium 3.6, chloride 96, bicarbonate of 39, BUN 7, creatinine 0.57, glucose of 103, WBC of 6, hemoglobin 11.6, platelets of 227,000.   ASSESSMENT AND PLAN: This is a 80 year old Caucasian female with a past medical history of chronic obstructive pulmonary disease with chronic respiratory failure, on 4 liters nasal cannula at baseline, presenting with shortness of breath.  1.  Chronic obstructive pulmonary disease exacerbation. Provide DuoNeb treatments q. 4 hours,  Solu-Medrol 60 mg IV q. daily, azithromycin given sputum production. Continue with home medications including albuterol as well as Spiriva.  2.  Hypertension, essential. Continue with beta blockade and metoprolol.  3.  Hyperlipidemia, unspecified. Continue with statin therapy.  4.  Gastroesophageal reflux disease without esophagitis. Continue with home medications, PPI therapy, pantoprazole.  5.  Obstructive sleep apnea. Continue CPAP therapy at bedtime.  6.  Venous thromboembolic prophylaxis with heparin subcutaneous.   CODE STATUS: The patient is full code.   TIME SPENT: 45 minutes.    ____________________________ Kathryn Mose. Jackelin Correia, MD dkh:AT D: 05/01/2014 00:53:44 ET T: 05/01/2014  02:29:15 ET JOB#: [id number]  cc: Kathryn Mose. Ellesse Antenucci, MD, <Dictator> Kenli Waldo Woodfin Ganja MD ELECTRONICALLY SIGNED 05/02/2014 10:49

## 2014-05-05 NOTE — Discharge Summary (Addendum)
PATIENT NAME:  Kathryn Hayes, Kathryn Hayes MR#:  [id number] DATE OF BIRTH:  03-Jul-1934  DATE OF ADMISSION:  04/30/2014 DATE OF DISCHARGE:  05/03/2014  PRESENTING COMPLAINT: Shortness of breath.   DISCHARGE DIAGNOSES:  1.  Acute exacerbation of chronic obstructive pulmonary disease.  2.  Acute on chronic respiratory failure with hypoxia at baseline on 3-4 liters nasal cannula at home.  3.  Hypertension.  4.  Hyperlipidemia.  5.  Gastroesophageal reflux disease.  6.  Obstructive sleep apnea on CPAP.  7.  Diabetes mellitus type 2, noninsulin-dependent.  8.  Depression.   CONSULTATIONS: None.   PROCEDURES: Chest x-ray April 26 shows underlying emphysema, small pleural effusions, mild bibasilar atelectasis, lungs otherwise clear, heart mildly enlarged.   HISTORY OF PRESENT ILLNESS: This 80 year old Caucasian female with past medical history of chronic respiratory failure on 3-4 liters via nasal cannula due to severe COPD, obstructive sleep apnea requiring CPAP, presents with shortness of breath. She describes 2 week total duration of increasing shortness of breath, dyspnea on exertion, production of yellow to green sputum. She saw her primary care physician for these symptoms and was given a course of Levaquin which she has finished with no improvement. She is being admitted for further workup and evaluation.   HOSPITAL COURSE BY PROBLEM:  1.  Acute exacerbation of COPD: The patient was treated with standard regimen of IV steroids, nebulizer treatments, azithromycin, and her home inhalers were continued. She remained on 4 liters via nasal cannula throughout her hospitalization. She states that her baseline oxygen requirement is 3-4 liters though more recently it has been consistently 4 liters. She will need to follow up with her pulmonologist in the outpatient setting for optimization of her chronic COPD regimen.  2.  Acute on chronic respiratory failure with hypoxia: She is on 3-4 liters at home and needed 4  liters consistently throughout this hospitalization due to COPD exacerbation. She is returning home on 4 liters via nasal cannula with oxygen saturations 95% at rest, 92% with exertion. 3.  Hypertension: Blood pressure was well controlled throughout the hospitalization on metoprolol.  4.  Hyperlipidemia: She continues on a statin.  5.  Gastroesophageal reflux disease: Continues on PPI therapy.  6.  Obstructive sleep apnea: She is compliant with her CPAP at home.   DISCHARGE MEDICATIONS:  1. Pravastatin 40 mg 1 tablet daily.  2. Pantoprazole 40 mg 1 tablet twice a day.  3. Gabapentin 300 mg 1 tablet 3 times a day.  4. Cymbalta 60 mg 1 tablet daily.  5. Ferrous sulfate 325 one tablet once a day in the morning.  6. Escitalopram 20 mg 1 tablet once a day in the morning.  7. Vitamin B12, 1000 mcg 1 tablet once a day in the morning.  8. Tiotropium 2.5 mcg per inhalation 2 puffs inhaled once a day.  9. Lorazepam 1 mg 1 tablet 3 times a day as needed for anxiety.  10. Albuterol 2.5 mg inhaled every 4 hours as needed for wheezing.  11. Furosemide 20 mg 1 tablet daily.  12. Tramadol 50 mg 1 tablet every 4 hours as needed for pain.  13. Trazodone 50 mg 2 tablets once a day at bedtime.  14. Advair Diskus 250-50 mcg 1 puff twice a day.  15. Docusate sodium 100 mg 1 capsule twice a day.  16. Carafate 1 gram oral tablet 1 tablet 3 times a day before meals and at bedtime.  17. Clotrimazole 10 mg 4 times a day.  18. Metoclopramide 5  mg 3 times a day with meals.  19. Metoprolol 25 mg 1 tablet twice a day.  20. Prednisone 10 mg slow taper 50 mg for 2 days, decrease by 10 mg every 2 days.  21. Azithromycin 500 mg 1 tablet once a day for 2 more days.  22. Oxygen via portable tank 3-4 liters at all times for chronic respiratory failure.   CONDITION ON DISCHARGE: Guarded.   DISPOSITION: She is being discharged home with home health, physical therapy, nursing, and nursing aide.   DIET: Low-sodium, low-fat,  low-cholesterol, carbohydrate-controlled diet.   ACTIVITY LIMITATION: No restrictions. Activity is encouraged.   TIME FRAME FOR FOLLOWUP: Follow up in 1-2 weeks with pulmonology, follow up in 1-2 weeks with primary care physician.   DISCHARGE PHYSICAL EXAMINATION:  VITAL SIGNS: Temperature 97.3, pulse 79, respirations 20, blood pressure 142/82, oxygenation 95% on 4 liters nasal cannula.  GENERAL: No acute distress.  RESPIRATORY: Lungs are clear to auscultation bilaterally with good air movement. No respiratory distress.  CARDIOVASCULAR: Regular rate and rhythm. No murmurs, rubs, or gallops. She has trace edema bilaterally with 2 + pulses.  ABDOMEN: Soft, nontender, nondistended. Bowel sounds are normal. No guarding, no rebound.  PSYCHIATRIC: She is alert and oriented with good insight into her clinical condition.   LABORATORY DATA ON DISCHARGE: Sodium 139, potassium 4.2, chloride 96, bicarbonate 36, BUN 16, creatinine 0.50, glucose 141. Troponin less than 0.03. White blood cells 6.3, hemoglobin 11.1, platelets 232,000, MCV is 87.   TIME SPENT ON DISCHARGE: 40 minutes.     ____________________________ Earleen Newport. Volanda Napoleon, MD cpw:bu D: 05/03/2014 15:09:20 ET T: 05/03/2014 15:27:00 ET JOB#: [id number]  cc: Barnetta Chapel P. Volanda Napoleon, MD, <Dictator> Aldean Jewett MD ELECTRONICALLY SIGNED 05/04/2014 13:00

## 2014-05-05 NOTE — Consult Note (Signed)
Chief Complaint:  Subjective/Chief Complaint Patient complained of significant shortness of breath dyspnea sitting up in bed on BiPAP still significantly  dyspneic denies any syncope blackout spells of vertigo it today   VITAL SIGNS/ANCILLARY NOTES: **Vital Signs.:   22-Feb-16 11:40  Vital Signs Type Routine  Temperature Temperature (F) 97.2  Celsius 36.2  Temperature Source oral  Pulse Pulse 35  Respirations Respirations 20  Systolic BP Systolic BP 782  Diastolic BP (mmHg) Diastolic BP (mmHg) 85  Mean BP 102  Pulse Ox % Pulse Ox % 91  Pulse Ox Activity Level  At rest  Oxygen Delivery Non-invasive ventilation (CPAP/BIPAP)  *Intake and Output.:   22-Feb-16 13:15  Grand Totals Intake:   Output:  400    Net:  -400 58 Hr.:  -1300  Urine ml     Out:  400  Urinary Method  Void; BSC  Stool  small brown stool  Rehab Summary:   22-Feb-16 11:31  Rehab Progress Summary Rehab Progress Summary Chart reviewed, spoke with RN, who states that patient is not doing well today. Did not think pt would get pacemaker today. Will discontinue Physical Therapy order at this time. If patient improved and is appropriate for therapy, please re-order.   Brief Assessment:  GEN well developed, well nourished, no acute distress, obese   Cardiac Regular  murmur present   Respiratory normal resp effort  postive use of accessory muscles  rhonchi  crackles   Gastrointestinal Normal   Gastrointestinal details normal Soft  Nontender  Nondistended  No masses palpable   EXTR negative cyanosis/clubbing, negative edema   Lab Results: Cardiology:  22-Feb-16 06:14   Ventricular Rate 35  Atrial Rate 97  QRS Duration 124  QT 546  QTc 416  P Axis 45  R Axis 71  T Axis 23  ECG interpretation sinus rhythm with complete heart block and ventricular escape rhythm.  Abnormal ECG   Confirmed by ARIDA, MUHAMMAD ([id number]) on 02/25/2014 9:46:47 AM  Overreader: Kathlyn Sacramento  Routine Chem:  22-Feb-16 10:41    Result Comment - VERIFIED IN ERROR  Result(s) reported on 25 Feb 2014 at 02:58PM.  Result Comment Potassium/BUN/Creatinine - Slight hemolysis, interpret results with  - caution.  Result(s) reported on 25 Feb 2014 at 11:56AM.  Potassium, Serum -  Potassium, Serum  6.0  Glucose, Serum  117  BUN  24  Creatinine (comp) 0.72  Sodium, Serum  126  Chloride, Serum  90  CO2, Serum 27  Calcium (Total), Serum  8.3  Anion Gap 9  Osmolality (calc) 258  eGFR (African American) >60  eGFR (Non-African American) >60 (eGFR values <82m/min/1.73 m2 may be an indication of chronic kidney disease (CKD). Calculated eGFR, using the MRDR Study equation, is useful in  patients with stable renal function. The eGFR calculation will not be reliable in acutely ill patients when serum creatinine is changing rapidly. It is not useful in patients on dialysis. The eGFR calculation may not be applicable to patients at the low and high extremes of body sizes, pregnant women, and vegetarians.)    15:07   Result Comment POTASSIUM - Slight hemolysis, interpret results with  - caution.  Result(s) reported on 25 Feb 2014 at 04:17PM.  Potassium, Serum  5.4   Radiology Results: XRay:    18-Feb-16 14:32, Chest Portable Single View  Chest Portable Single View   REASON FOR EXAM:    Shortness of Breath on bipap  COMMENTS:       PROCEDURE: DXR -  DXR PORTABLE CHEST SINGLE VIEW  - Feb 21 2014  2:32PM     CLINICAL DATA:  Increasing shortness of Breath    EXAM:  PORTABLE CHEST - 1 VIEW    COMPARISON:  06/06/2013    FINDINGS:  The cardiac shadow remains enlarged. The lungs are well aerated  bilaterally although a right-sided pleural effusion is noted  posteriorly. No bony abnormality is noted.     IMPRESSION:  New right pleural effusion.      Electronically Signed    By: Inez Catalina M.D.    On: 02/21/2014 14:56         Verified By: Everlene Farrier, M.D.,    19-Feb-16 13:13, Chest 1 View for Post  Thoracentesis  Chest 1 View for Post Thoracentesis   REASON FOR EXAM:    post thoracentesis  COMMENTS:       PROCEDURE: DXR - DXR CHEST 1 VIEW POST [facility]  - Feb 22 2014  1:13PM     CLINICAL DATA:  80 year old female status post right-sided  thoracentesis    EXAM:  CHEST XRAY 1 VIEW    COMPARISON:  Prior chest x-ray obtained yesterday 02/11/2014    FINDINGS:  Stable cardiomegaly. Atherosclerotic calcifications again noted in  the transverse aorta. No evidence of pneumothorax. The right-sided  pleural effusion has resolved. There is minimal residual right  basilar atelectasis. Persistent haziness in the left lung base  likely reflecting a small left layering pleural effusion and  associated lower lobe atelectasis. Pulmonary vascular congestion  without overt edema. No acute osseous abnormality.     IMPRESSION:  No evidence of pneumothorax status post right thoracentesis.    The right-sided pleural effusion has largely resolved.      Electronically Signed    By: Jacqulynn Cadet M.D.    On: 02/22/2014 14:23         Verified By: Criselda Peaches, M.D.,  [place]:    19-Feb-16 13:14, US Guide Thoracentesis Right  US Guide Thoracentesis Right   REASON FOR EXAM:    sob, pleural effsusion  COMMENTS:       PROCEDURE: [place]  - US GUIDED THORACENTESIS RIGHT  - Feb 22 2014  1:14PM     CLINICAL DATA:  80 year old female with acute shortness of breath  and bilateral right larger than left pleural effusions    EXAM:  ULTRASOUND GUIDED RIGHT THORACENTESIS    COMPARISON:  Chest x-ray 02/21/2014    PROCEDURE:  An ultrasound guided thoracentesis was thoroughly discussed with the  patient and questions answered. The benefits, risks, alternatives  and complications were also discussed. The patient understands and  wishes to proceed with the procedure. Written consent was obtained.    Ultrasound was performed to localize and mark an adequate pocket of  fluid in the right chest. The  area was then prepped and draped in  the normal sterile fashion. 1% Lidocaine was used for local  anesthesia. Under ultrasound guidance a Safe-T-Centesis catheter was  introduced. Thoracentesis was performed. The catheter was removed  and a dressing applied.    COMPLICATIONS:  None.    FINDINGS:  A total of approximately 800 mL of bloody pleural fluid was removed.  A fluid sample was sent for laboratory analysis.     IMPRESSION:  Successful ultrasound guided right thoracentesis yielding 800 mL of  bloody pleural fluid. Samples were sent for laboratory analysis.      Electronically Signed    By: Dellis Filbert.D.  On: 02/22/2014 14:28         Verified By: Criselda Peaches, M.D.,  Cardiology:    18-Feb-16 14:12, ED ECG  Ventricular Rate 40  Atrial Rate 40  P-R Interval 180  QRS Duration 112  QT 574  QTc 467  P Axis 36  R Axis 23  T Axis 30  ECG interpretation   Marked sinus bradycardia  Incomplete right bundle branch block  ST & T wave abnormality, consider anterior ischemia  Abnormal ECG  When compared with ECG of 22-May-2011 12:02,  No significant change was found  ----------unconfirmed----------  Confirmed by OVERREAD, NOT ([id number]), editor PEARSON, BARBARA ([id number]) on 02/22/2014 10:11:28 AM  ED ECG     18-Feb-16 19:51, Echo Doppler  Echo Doppler   REASON FOR EXAM:      COMMENTS:       PROCEDURE: [facility] - ECHO DOPPLER COMPLETE(TRANSTHOR)  - Feb 21 2014  7:51PM     RESULT: Echocardiogram Report    Patient Name:   HALEA LIEB Rengel Date of Exam: 02/21/2014  Medical Rec #:  [id number]         Custom1:  Date of Birth:  1934-12-26      Height:       64.0 in  Patient Age:    80 years       Weight:       190.0 lb  Patient Gender: F              BSA:          1.91 m??    Indications: MI  Sonographer:    Arville Go RDCS  Referring Phys: Myrtis Ser, P    Sonographer Comments: Technically difficult study due to poor echo   windows and suboptimal parasternal  window.    Summary:   1. Left ventricular ejection fraction, by visual estimation, is 50 to   55%.   2. Mildly dilated left atrium.   3. Mildly dilated right atrium.   4. Moderately elevated pulmonary artery systolic pressure.  LV SYSTOLIC FUNCTION BY 2D PLANIMETRY (MOD):  EF-A4C View: 55.3 %  SPECTRAL DOPPLER ANALYSIS (where applicable):  Aortic Valve: AoV Max Vel: 1.52 m/s AoV Peak PG: 9.2 mmHg AoV Mean PG:  LVOT Vmax: 1.58 m/s LVOT VTI:  LVOT Diameter:  Tricuspid Valve and PA/RV Systolic Pressure: TR Max Velocity: 3.38 m/s RA   Pressure: 10 mmHg RVSP/PASP: 55.7 mmHg    PHYSICIAN INTERPRETATION:  Left Ventricle: Not well seen. LV septalwall thickness was normal. Left   ventricular ejection fraction, by visual estimation, is 50 to 55%.  Right Ventricle: The right ventricle was not well seen. The right   ventricular size is mildly enlarged. Global RV systolic function is low   normal.  Left Atrium: The left atrium is mildly dilated.  Right Atrium: The right atrium was not well visualized. The right atrium   is mildly dilated.  Pericardium: There is no evidence of pericardial effusion.  Mitral Valve: The mitral valve is not well seen.  Tricuspid Valve: The tricuspid valve is not well seen. The tricuspid   regurgitant velocity is 3.38 m/s, and with an assumed right atrial   pressure of 10 mmHg, the estimated right ventricular systolic pressure is   moderately elevated at 55.7 mmHg.  Aortic Valve: The aortic valve was not well seen.    [place] MD  Electronically signed by [place] MD  Signature Date/Time: 02/22/2014/3:03:15 PM    *** Final ***  IMPRESSION: .      Verified By: Yolonda Kida, M.D., MD    19-Feb-16 08:20, ECG  Ventricular Rate 38  Atrial Rate 62  QRS Duration 108  QT 448  QTc 356  R Axis 85  T Axis 74  ECG interpretation   Third degree AVB with junctional escape rhythm.   Low voltage QRS  Nonspecific ST and T wave  abnormality  Abnormal ECG  When compared with ECG of 21-Feb-2014 14:12,  Third degree AVB replaced sinus bradycardia      Confirmed by Fletcher Anon, MUHAMMAD ([id number]) on 02/22/2014 12:51:39 PM    Overreader: Kathlyn Sacramento  ECG     21-Feb-16 01:39, ECG  Ventricular Rate 38  Atrial Rate 277  QRS Duration 122  QT 564  QTc 448  R Axis 4  T Axis 109  ECG interpretation   Sinus rhythm with complete heart block and ventricular escape rhythm.   Abnormal ECG  When compared with ECG of 22-Feb-2014 08:20,  No significant change was found  Confirmed by ARIDA, MUHAMMAD ([id number]) on 02/25/2014 9:48:32 AM    Overreader: Kathlyn [place]  ECG     22-Feb-16 06:14, ECG  Ventricular Rate 35  Atrial Rate 97  QRS Duration 124  QT 546  QTc 416  P Axis 45  R Axis 71  T Axis 23  ECG interpretation   sinus rhythm with complete heart block and ventricular escape rhythm.   Abnormal ECG      Confirmed by Fletcher Anon, MUHAMMAD ([id number]) on 02/25/2014 9:46:47 AM    Overreader: Kathlyn Sacramento  ECG    Assessment/Plan:  Assessment/Plan:  Assessment COPD with exacerbation sick sinus syndrome  bradycardia  COPD  vertigo  obesity  respiratory failure  shortness of breath  diabetes  hypertension  hyperlipidemia  obstructive sleep apnea  reflux  hypoxemia .   Plan agree with significant thoracentesis continue supplemental oxygen  agree with inhalers  continue steroid therapy  agree with pulmonary consultation input  CPAP therapy  low-dose Lasix for heart failure  broad spectrum antibiotic therapy  recommend permanent pacemaker therapy  continue diabetes therapy and management  continue blood pressure control Once COPD and history failure controlled then recommend permanent pacemaker placement   Electronic Signatures: Yolonda Kida (MD)  (Signed 22-Feb-16 18:34)  Authored: Chief Complaint, VITAL SIGNS/ANCILLARY NOTES, Brief Assessment, Lab Results, Radiology Results, Assessment/Plan   Last  Updated: 22-Feb-16 18:34 by Yolonda Kida (MD)

## 2014-05-05 NOTE — Consult Note (Signed)
Chief Complaint:  Subjective/Chief Complaint The breathing slightly better today no pain no dizziness states that she feels ready for permanent pacemaker placement   VITAL SIGNS/ANCILLARY NOTES: **Vital Signs.:   24-Feb-16 12:01  Vital Signs Type Routine  Temperature Temperature (F) 97.8  Celsius 36.5  Pulse Pulse 49  Respirations Respirations 18  Systolic BP Systolic BP 616  Diastolic BP (mmHg) Diastolic BP (mmHg) 60  Mean BP 98  Pulse Ox % Pulse Ox % 95  Pulse Ox Activity Level  At rest  Oxygen Delivery 5L  *Intake and Output.:   Daily 24-Feb-16 07:00  Oral Intake      In:  480  IV (Primary)      In:  600  IV (Secondary)      In:  200  Urine ml     Out:  7300  Length of Stay Totals Intake:  4750 Output:  20200    Net:  [id number]   Brief Assessment:  GEN well developed, well nourished, no acute distress, obese   Cardiac Regular  murmur present   Respiratory normal resp effort  postive use of accessory muscles  rhonchi  crackles   Gastrointestinal Normal   Gastrointestinal details normal Soft  Nontender  Nondistended  No masses palpable   EXTR negative cyanosis/clubbing, negative edema   Lab Results: Routine Chem:  24-Feb-16 04:59   BUN  26  Creatinine (comp) 1.05  Sodium, Serum 138  Potassium, Serum 4.2  Chloride, Serum  96  CO2, Serum  38  Calcium (Total), Serum  8.1  Anion Gap  4  Osmolality (calc) 284  eGFR (African American) >60  eGFR (Non-African American)  54 (eGFR values <74m/min/1.73 m2 may be an indication of chronic kidney disease (CKD). Calculated eGFR, using the MRDR Study equation, is useful in  patients with stable renal function. The eGFR calculation will not be reliable in acutely ill patients when serum creatinine is changing rapidly. It is not useful in patients on dialysis. The eGFR calculation may not be applicable to patients at the low and high extremes of body sizes, pregnant women, and vegetarians.)  Routine Hem:  24-Feb-16  04:59   WBC (CBC) 10.7  RBC (CBC)  3.68  Hemoglobin (CBC)  10.2  Hematocrit (CBC)  32.4  Platelet Count (CBC) 200  MCV 88  MCH 27.7  MCHC  31.5  RDW  15.8  Neutrophil % 91.3  Lymphocyte % 2.6  Monocyte % 6.0  Eosinophil % 0.0  Basophil % 0.1  Neutrophil #  9.8  Lymphocyte #  0.3  Monocyte # 0.6  Eosinophil # 0.0  Basophil # 0.0 (Result(s) reported on 27 Feb 2014 at 05:57AM.)   Radiology Results: XRay:    18-Feb-16 14:32, Chest Portable Single View  Chest Portable Single View   REASON FOR EXAM:    Shortness of Breath on bipap  COMMENTS:       PROCEDURE: DXR - DXR PORTABLE CHEST SINGLE VIEW  - Feb 21 2014  2:32PM     CLINICAL DATA:  Increasing shortness of Breath    EXAM:  PORTABLE CHEST - 1 VIEW    COMPARISON:  06/06/2013    FINDINGS:  The cardiac shadow remains enlarged. The lungs are well aerated  bilaterally although a right-sided pleural effusion is noted  posteriorly. No bony abnormality is noted.     IMPRESSION:  New right pleural effusion.      Electronically Signed    By: MInez CatalinaM.D.    On:  02/21/2014 14:56         Verified By: Everlene Farrier, M.D.,    19-Feb-16 13:13, Chest 1 View for Post Thoracentesis  Chest 1 View for Post Thoracentesis   REASON FOR EXAM:    post thoracentesis  COMMENTS:       PROCEDURE: DXR - DXR CHEST 1 VIEW POST [facility]  - Feb 22 2014  1:13PM     CLINICAL DATA:  80 year old female status post right-sided  thoracentesis    EXAM:  CHEST XRAY 1 VIEW    COMPARISON:  Prior chest x-ray obtained yesterday 02/11/2014    FINDINGS:  Stable cardiomegaly. Atherosclerotic calcifications again noted in  the transverse aorta. No evidence of pneumothorax. The right-sided  pleural effusion has resolved. There is minimal residual right  basilar atelectasis. Persistent haziness in the left lung base  likely reflecting a small left layering pleural effusion and  associated lower lobe atelectasis. Pulmonary vascular  congestion  without overt edema. No acute osseous abnormality.     IMPRESSION:  No evidence of pneumothorax status post right thoracentesis.    The right-sided pleural effusion has largely resolved.      Electronically Signed    By: Jacqulynn Cadet M.D.    On: 02/22/2014 14:23         Verified By: Criselda Peaches, M.D.,    [phone] 20:54, Chest Portable Single View  Chest Portable Single View   REASON FOR EXAM:    pnemmonia/chf  COMMENTS:       PROCEDURE: DXR - DXR PORTABLE CHEST SINGLE VIEW  - Feb 25 2014  8:54PM     CLINICAL DATA:  Evaluate pneumonia/ CHF. Bronchitis. Shortness of  breath on CPAP machine.    EXAM:  PORTABLE CHEST - 1 VIEW    COMPARISON:  02/1914    FINDINGS:  Cardiac enlargement with mild pulmonary vascular congestion.  Probable small bilateral pleural effusions. Infiltration or  atelectasis in both lung bases appears to be increasing since prior  study. No pneumothorax. Calcification of aorta.     IMPRESSION:  Cardiac enlargement with mild pulmonary vascular congestion. Small  pleural effusions bilaterally. Bilateral basilar infiltration or  atelectasis, worse on the right, increasing since prior study.      Electronically Signed    By: Lucienne Capers M.D.    On: 02/25/2014 21:17       Verified By: Neale Burly, M.D.,  [place]:    19-Feb-16 13:14, US Guide Thoracentesis Right  US Guide Thoracentesis Right   REASON FOR EXAM:    sob, pleural effsusion  COMMENTS:       PROCEDURE: [place]  - US GUIDED THORACENTESIS RIGHT  - Feb 22 2014  1:14PM     CLINICAL DATA:  80 year old female with acute shortness of breath  and bilateral right larger than left pleural effusions    EXAM:  ULTRASOUND GUIDED RIGHT THORACENTESIS    COMPARISON:  Chest x-ray 02/21/2014    PROCEDURE:  An ultrasound guided thoracentesis was thoroughly discussed with the  patient and questions answered. The benefits, risks, alternatives  and complications were also  discussed. The patient understands and  wishes to proceed with the procedure. Written consent was obtained.    Ultrasound was performed to localize and mark an adequate pocket of  fluid in the right chest. The area was then prepped and draped in  the normal sterile fashion. 1% Lidocaine was used for local  anesthesia. Under ultrasound guidance a Safe-T-Centesis catheter was  introduced. Thoracentesis  was performed. The catheter was removed  and a dressing applied.    COMPLICATIONS:  None.    FINDINGS:  A total of approximately 800 mL of bloody pleural fluid was removed.  A fluid sample was sent for laboratory analysis.     IMPRESSION:  Successful ultrasound guided right thoracentesis yielding 800 mL of  bloody pleural fluid. Samples were sent for laboratory analysis.      Electronically Signed    By: Jacqulynn Cadet M.D.    On: 02/22/2014 14:28         Verified By: Criselda Peaches, M.D.,  Cardiology:    18-Feb-16 14:12, ED ECG  Ventricular Rate 40  Atrial Rate 40  P-R Interval 180  QRS Duration 112  QT 574  QTc 467  P Axis 36  R Axis 23  T Axis 30  ECG interpretation   Marked sinus bradycardia  Incomplete right bundle branch block  ST & T wave abnormality, consider anterior ischemia  Abnormal ECG  When compared with ECG of 22-May-2011 12:02,  No significant change was found  ----------unconfirmed----------  Confirmed by OVERREAD, NOT ([id number]), editor PEARSON, BARBARA ([id number]) on 02/22/2014 10:11:28 AM  ED ECG     18-Feb-16 19:51, Echo Doppler  Echo Doppler   REASON FOR EXAM:      COMMENTS:       PROCEDURE: [facility] - ECHO DOPPLER COMPLETE(TRANSTHOR)  - Feb 21 2014  7:51PM     RESULT: Echocardiogram Report    Patient Name:   REGENE MCCARTHY Mcdonell Date of Exam: 02/21/2014  Medical Rec #:  [id number]         Custom1:  Date of Birth:  11-29-1934      Height:       64.0 in  Patient Age:    80 years       Weight:       190.0 lb  Patient Gender: F              BSA:          1.91  m??    Indications: MI  Sonographer:    Arville Go RDCS  Referring Phys: Myrtis Ser, P    Sonographer Comments: Technically difficult study due to poor echo   windows and suboptimal parasternal window.    Summary:   1. Left ventricular ejection fraction, by visual estimation, is 50 to   55%.   2. Mildly dilated left atrium.   3. Mildly dilated right atrium.   4. Moderately elevated pulmonary artery systolic pressure.  LV SYSTOLIC FUNCTION BY 2D PLANIMETRY (MOD):  EF-A4C View: 55.3 %  SPECTRAL DOPPLER ANALYSIS (where applicable):  Aortic Valve: AoV Max Vel: 1.52 m/s AoV Peak PG: 9.2 mmHg AoV Mean PG:  LVOT Vmax: 1.58 m/s LVOT VTI:  LVOT Diameter:  Tricuspid Valve and PA/RV Systolic Pressure: TR Max Velocity: 3.38 m/s RA   Pressure: 10 mmHg RVSP/PASP: 55.7 mmHg    PHYSICIAN INTERPRETATION:  Left Ventricle: Not well seen. LV septalwall thickness was normal. Left   ventricular ejection fraction, by visual estimation, is 50 to 55%.  Right Ventricle: The right ventricle was not well seen. The right   ventricular size is mildly enlarged. Global RV systolic function is low   normal.  Left Atrium: The left atrium is mildly dilated.  Right Atrium: The right atrium was not well visualized. The right atrium   is mildly dilated.  Pericardium: There is no evidence of pericardial effusion.  Mitral Valve: The mitral  valve is not well seen.  Tricuspid Valve: The tricuspid valve is not well seen. The tricuspid   regurgitant velocity is 3.38 m/s, and with an assumed right atrial   pressure of 10 mmHg, the estimated right ventricular systolic pressure is   moderately elevated at 55.7 mmHg.  Aortic Valve: The aortic valve was not well seen.    [place] MD  Electronically signed by [place] MD  Signature Date/Time: 02/22/2014/3:03:15 PM    *** Final ***    IMPRESSION: .      Verified By: Yolonda Kida, M.D., MD    19-Feb-16 08:20, ECG  Ventricular  Rate 38  Atrial Rate 62  QRS Duration 108  QT 448  QTc 356  R Axis 85  T Axis 74  ECG interpretation   Third degree AVB with junctional escape rhythm.   Low voltage QRS  Nonspecific ST and T wave abnormality  Abnormal ECG  When compared with ECG of 21-Feb-2014 14:12,  Third degree AVB replaced sinus bradycardia      Confirmed by Fletcher Anon, MUHAMMAD ([id number]) on 02/22/2014 12:51:39 PM    Overreader: Kathlyn Sacramento  ECG     21-Feb-16 01:39, ECG  Ventricular Rate 38  Atrial Rate 277  QRS Duration 122  QT 564  QTc 448  R Axis 4  T Axis 109  ECG interpretation   Sinus rhythm with complete heart block and ventricular escape rhythm.   Abnormal ECG  When compared with ECG of 22-Feb-2014 08:20,  No significant change was found  Confirmed by ARIDA, MUHAMMAD ([id number]) on 2/22/20169:48:32 AM    Overreader: ARIDA, MUHAMMAD  ECG     22-Feb-16 06:14, ECG  Ventricular Rate 35  Atrial Rate 97  QRS Duration 124  QT 546  QTc 416  P Axis 45  R Axis 71  T Axis 23  ECG interpretation   sinus rhythm with complete heart block and ventricular escape rhythm.   Abnormal ECG      Confirmed by Fletcher Anon, MUHAMMAD ([id number]) on 02/25/2014 9:46:47 AM    Overreader: Kathlyn Sacramento  ECG    Assessment/Plan:  Assessment/Plan:  Assessment bradycardia symptomatic  sick sinus syndrome  congestive heart failure acute on chronic diastolic respiratory failure COPD with exacerbation  vertigo  obesity  shortness of breath  diabetes  hypertension  hyperlipidemia  obstructive sleep apnea  reflux  hypoxemia .   Plan recommend permanent pacemaker placement tomorrow with Dr. Saralyn Pilar continue aggressive respiratory support and treatment  status post significant thoracentesis continue supplemental oxygen  agree with inhalers  continue steroid therapy  agree with pulmonary consultation input  CPAP therapy  low-dose Lasix for heart failure  broad spectrum antibiotic therapy  continue diabetes therapy  and management  continue blood pressure control Once COPD and history failure controlled then recommend permanent pacemaker placement   Electronic Signatures: Yolonda Kida (MD)  (Signed 24-Feb-16 16:59)  Authored: Chief Complaint, VITAL SIGNS/ANCILLARY NOTES, Brief Assessment, Lab Results, Radiology Results, Assessment/Plan   Last Updated: 24-Feb-16 16:59 by Yolonda Kida (MD)

## 2014-05-05 NOTE — Consult Note (Signed)
PATIENT NAME:  Kathryn Hayes, Kathryn Hayes MR#:  [id number] DATE OF BIRTH:  Jul 02, 1934  DATE OF CONSULTATION:  02/23/2014  REFERRING PHYSICIAN:   CONSULTING PHYSICIAN:  Allyne Gee, MD  REASON FOR CONSULTATION: Shortness of breath.   HISTORY OF PRESENT ILLNESS: This is a 80 year old female with a history of chronic respiratory failure on oxygen at home. She also has a sleep apnea. She presented to the hospital with increasing shortness of breath, cough, and weakness. Basically, she says that she had been having increasing difficulty with moving around. She had no strength, apparently, on the day of admission, and she said that she sat there like that for about an hour. The patient had been having increasing shortness of breath and some cough and some congestion. She had no chest pain, no palpitations noted. The patient denied having any fevers or chills. She did have some wheezing noted. When she was seen in the Emergency Room, saturations were in the 90s. She was started on BiPAP because it was noted that she had increased work of breathing. She had a transient episode of bradycardia, with a heart rate in the 30s, but then baseline heart rate is usually around 50.   PAST MEDICAL HISTORY: Significant for diabetes, hypertension, hyperlipidemia, COPD, reflux, colon cancer, sleep apnea.   PAST SURGICAL HISTORY: Significant for colon surgery, breast surgery, and also has had tubal ligation.   SOCIAL HISTORY: She is a former smoker. She says she quit about 3 years ago. Occasionally uses alcohol.   FAMILY HISTORY: Positive for cancer.  ALLERGIES: Are negative.   MEDICATIONS: Reviewed on the electronic medical record.   REVIEW OF SYSTEMS: Negative, other than what is noted above in the HPI. In addition, she says that she does not really use her inhalers as prescribed, and she uses them maybe once or twice a day.   PHYSICAL EXAMINATION: VITAL SIGNS: Temperature 97.2, pulse of about 40, respiratory rate 30,  blood pressure 150/91, saturations are 96.  NECK: Neck appeared to be supple. There was no JVD. No adenopathy. No thyromegaly.  CHEST: No rhonchi or rales. Expansion was equal.  CARDIOVASCULAR: S1, S2 is normal. Regular rhythm. No gallop or rub.  ABDOMEN: Soft and nontender.  NEUROLOGIC: She was awake and alert, moving all 4 extremities.  EXTREMITIES: Without cyanosis or clubbing. No edema. Pulses equal.  MUSCULOSKELETAL: No active synovitis.  SKIN: Without any acute rashes.   LABORATORY DATA: radiological studies: The initial chest x-ray she had showed a right-sided pleural effusion. She did undergo thoracentesis, and the follow-up x-ray showed no pneumothorax and the effusion had been resolved. Initial laboratory work that had been done on the effusion shows an LDH of 66, predominantly a lymphocytic diff. Cultures were sent also, which are pending, with no growth initially.   IMPRESSION: 1. Acute on chronic hypoxemic respiratory failure.  2. Right-sided pleural effusion, which is now resolved.  3. Noncompliance with medications.   PLAN: She was educated about proper use of her medications, and she seems to understand. The family was present also, and they will reinforce that. She should continue with antibiotics, continue with inhalers, and consider adding inhaled steroid. She should have follow-up x-rays as necessary. Further recommendations as needed.    ____________________________ Allyne Gee, MD sak:mw D: 02/23/2014 10:40:18 ET T: 02/23/2014 12:57:58 ET JOB#: [id number]  cc: Allyne Gee, MD, <Dictator> Allyne Gee MD ELECTRONICALLY SIGNED 02/24/2014 12:36

## 2014-05-05 NOTE — Discharge Summary (Signed)
PATIENT NAME:  Kathryn Hayes, Kathryn Hayes MR#:  [id number] DATE OF BIRTH:  [birth year]  DATE OF ADMISSION:  02/21/2014 DATE OF DISCHARGE:  03/05/2014  CHIEF COMPLAINT ON ADMISSION: Shortness of breath and weakness.   DISCHARGE DIAGNOSES:  1.  Symptomatic bradycardia due to second degree heart block status post dual-chamber pacemaker placement.  2.  Acute on chronic respiratory failure with hypercarbia and hypoxia.  3.  Chronic respiratory failure with baseline requirement of 4 liters of oxygen by nasal cannula.  4.  Chronic obstructive pulmonary disease.  5.  Pleural effusion status post thoracentesis producing 800 mL of transudative fluid.  6.  Diabetes mellitus type 2.  7.  Gastroesophageal reflux disease.  8.  Obstructive sleep apnea on CPAP.  9.  Anxiety.  10.  Syndrome of inappropriate antidiuretic hormone secretion.   11.  Hypertension.  12.  Hyperlipidemia.  13.  Colon cancer status post resection 1996.  14.  Breast cancer status post lumpectomy and radiation 2007.   CONSULTATIONS:  1. Dr. Lujean Amel, cardiology.  2. Dr. Devona Konig, pulmonology.  3. Dr. Lavonia Dana,  nephrology.  4. Dr. Murlean Iba, nephrology.  5. Dr. Isaias Cowman, cardiology.   PROCEDURES:  1.  Placement of dual-chamber pacemaker on February 25 by Dr. Isaias Cowman.  2.  A 2-D echocardiogram February 18 showing ejection fraction 50%-55%. Mildly dilated left atrium. Mildly dilated right atrium. Moderately elevated pulmonary artery systolic pressure. 3.  Ultrasound guided thoracentesis February 19 with removal of 800 mL of bloody pleural fluid. Transudative.   4.  Most recent chest x-ray is February 28, shows no evidence of acute cardiopulmonary disease.   HISTORY OF PRESENT ILLNESS: This pleasant 80 year old woman with past medical history of COPD, chronic respiratory failure on 3-4 liters of oxygen at home, obstructive sleep apnea on CPAP, diabetes, hypertension, and hyperlipidemia presented  with complaint of worsening shortness of breath and acute weakness starting on the morning of admission. She reports that she took off her CPAP upon waking and tried to get out of bed and felt her body locking up. She states that she was unable to move her arms and legs and was unable to call for help. She stayed that way for about an hour and finally was able to call EMS. On presentation to the hospital her heart rate is in the 30s. She is not on any beta blockers.   HOSPITAL COURSE BY PROBLEM:  1.  Bradycardia with second degree heart block: The patient was seen by cardiology and had a dual chamber pacemaker placed by Dr. Saralyn Pilar on February 25. Since pacemaker placement her heart rate has been in the 80s to low 100s.  2.  Acute on chronic respiratory failure due to COPD exacerbation. The patient uses 3-4 liters of oxygen via nasal cannula at home. Oxygen requirement in hospital increased to 5 liters. Pulmonology did see the patient during the hospitalization. She was continued on antibiotics, inhalers, nebulizers, and systemic steroids. She is being discharged on a steroid taper. She has completed her course of antibiotics and shows no further signs of exacerbation or infection. She will need to continue with Spiriva and Advair upon discharge. She will also have albuterol as needed.  3.  Diabetes mellitus type 2: The patient was hypoglycemic initially during her hospitalization, but then became hyperglycemic with the addition of steroids for her COPD exacerbation. She was on multiple medications prior to admission including metformin, Actos, and glipizide. At this time she is only on sliding scale insulin  for blood sugar control. I recommend that this be continued as we are tapering off of her steroids and hope to come off within the next week. She was taken off of Actos permanently due to congestive heart failure with pleural effusion. She should resume metformin and glipizide once prednisone taper has  been completed.  4.  Transudative pleural effusion: Status post thoracentesis with removal of 800 mL of fluid. This was not an infected effusion. Effusion likely due to diastolic congestive heart failure with elevated pulmonary artery pressures.  5.  Obstructive sleep apnea: Continue with CPAP.  6.  Anxiety: This was controlled throughout the hospitalization on her home regimen of lorazepam 3 times a day as needed, trazodone at bedtime, escitalopram, and Cymbalta. 7.  Gastroesophageal reflux disease. Continue PPI.  8.  Hyperlipidemia: Continue statin.   DISCHARGE MEDICATIONS:  1. Pravastatin 40 mg 1 tablet daily.  2. Pantoprazole 40 mg 1 tablet twice a day.  3. Gabapentin 400 mg 1 tablet 3 times a day.  4. Cymbalta 60 mg 1 tablet once a day.  5. Ferrous sulfate 325 one tablet once a day.  6. Escitalopram 20 mg 1 tablet once a day.  7. Trazodone 50 mg 2 tablets at bedtime.  8. Vitamin B12 1000 mcg 1 tablet once a day.  9. Naproxen 500 mg 1 tablet twice a day.  10. Tiotropium 2.5 mcg per inhalation 2 puffs once a day.  11. Lorazepam 1 mg 1 tablet 3 times a day as needed for anxiety.  12. Albuterol 2.5 mg every 4 hours as needed for wheezing.  13. Furosemide 20 mg 1 tablet once a day.  14. Tramadol 50 mg 1 tablet every 4 hours as needed for pain.   15. Advair 250-50 one puff inhaled twice a day.  16. Insulin aspart per sliding scale.  17. Prednisone 20 mg 2 tablets once a day x 3 days, 1 tablet x 3 days, and then stop.   DISCHARGE PHYSICAL EXAMINATION:  VITAL SIGNS: Temperature 98.3, pulse 91, respirations 18, blood pressure 142/81, oxygenation 96% on 5 liters via nasal cannula.  GENERAL: No acute distress.   RESPIRATORY: Lungs clear to auscultation bilaterally with good air movement.   CARDIOVASCULAR: Regular rate and rhythm. There is a 3 out of 6 systolic ejection murmur. No peripheral edema. Peripheral pulses 1 +.  ABDOMEN: Soft, tender, nondistended. Is obese. No guarding, no rebound,  no mass. No hepatosplenomegaly.  PSYCHIATRIC: The patient alert and oriented with good insight into her clinical conditions.   LABORATORY DATA: Sodium 142, potassium 4.1, chloride 98, bicarbonate 38, BUN 15, creatinine 0.77, glucose 192. White blood cells 9.2, hemoglobin 10.6, platelets 153,000. MCV is 90.     CONDITION ON DISCHARGE: Fair.   DISPOSITION: Discharge to skilled nursing facility.   DISCHARGE INSTRUCTIONS:   DIET: Heart healthy, carbohydrate modified diet.   ACTIVITY: As tolerated.   TIMEFRAME FOR FOLLOWUP:  Please follow up within 1-2 weeks with your pulmonologist, cardiologist, and primary care physician.   TIME SPENT ON DISCHARGE: 45 minutes.     ____________________________ Earleen Newport. Volanda Napoleon, MD cpw:bu D: 03/05/2014 12:39:30 ET T: 03/05/2014 12:59:27 ET JOB#: [id number]  cc: Barnetta Chapel P. Volanda Napoleon, MD, <Dictator> Aldean Jewett MD ELECTRONICALLY SIGNED 03/16/2014 10:51

## 2014-05-05 NOTE — Consult Note (Signed)
Chief Complaint:  Subjective/Chief Complaint Congestion shortness of breath bleeding worse today still cough congestive   VITAL SIGNS/ANCILLARY NOTES: **Vital Signs.:   20-Feb-16 05:22  Vital Signs Type Routine  Celsius 36.2  Temperature Source oral  Pulse Pulse 38  Respirations Respirations 36  Systolic BP Systolic BP 856  Diastolic BP (mmHg) Diastolic BP (mmHg) 91  Mean BP 110  Pulse Ox % Pulse Ox % 96  Pulse Ox Activity Level  At rest  Oxygen Delivery Non-invasive ventilation (CPAP/BIPAP)  VITAL SIGNS/ANCILLARY NOTES: Rehab Summary:   20-Feb-16 13:00  Rehab Progress Summary Rehab Progress Summary Chart Reviewed; PT identified continued bradycardia with HR 38 BPM; Cardiac consult said that patient is not a candidate for pacemaker at this time but could be a candidate soon; Nursing said that patient continues to get very short of breath with minimal movement; Patient on hold per RN with continued bradycardia as she is not appropriate for PT at this time; will re-attempt PT eval once patient is appropriate.   Brief Assessment:  GEN well developed, well nourished, no acute distress   Cardiac Regular   Respiratory normal resp effort  postive use of accessory muscles  wheezing  rhonchi  crackles   Gastrointestinal Normal   Gastrointestinal details normal Soft  Nontender  Nondistended  No masses palpable  Bowel sounds normal   Lab Results: Routine Chem:  20-Feb-16 05:10   Glucose, Serum 96  BUN  28  Creatinine (comp) 1.11  Sodium, Serum  124  Potassium, Serum 4.6  Chloride, Serum  85  CO2, Serum 32  Calcium (Total), Serum  8.4  Anion Gap 7  Osmolality (calc) 255  eGFR (African American) >60  eGFR (Non-African American)  51 (eGFR values <47m/min/1.73 m2 may be an indication of chronic kidney disease (CKD). Calculated eGFR, using the MRDR Study equation, is useful in  patients with stable renal function. The eGFR calculation will not be reliable in acutely ill  patients when serum creatinine is changing rapidly. It is not useful in patients on dialysis. The eGFR calculation may not be applicable to patients at the low and high extremes of body sizes, pregnant women, and vegetarians.)    11:34   Glucose, Serum  185  BUN  27  Creatinine (comp) 1.12  Sodium, Serum  122  Potassium, Serum 4.5  Chloride, Serum  85  CO2, Serum 30  Calcium (Total), Serum  8.1  Anion Gap 7  Osmolality (calc) 256  eGFR (African American) >60  eGFR (Non-African American)  50 (eGFR values <660mmin/1.73 m2 may be an indication of chronic kidney disease (CKD). Calculated eGFR, using the MRDR Study equation, is useful in  patients with stable renal function. The eGFR calculation will not be reliable in acutely ill patients when serum creatinine is changing rapidly. It is not useful in patients on dialysis. The eGFR calculation may not be applicable to patients at the low and high extremes of body sizes, pregnant women, and vegetarians.)  Misc Urine Chem:  20-Feb-16 08:24   Osmolality, Urine Random 158 (50-1400 300-900 mOsm/kg   with avg Fluid Intake > 850 mOsm/kg  with Fluid Restriction)  Routine Hem:  20-Feb-16 05:10   WBC (CBC) 10.2  RBC (CBC)  3.36  Hemoglobin (CBC)  9.9  Hematocrit (CBC)  29.6  Platelet Count (CBC) 183  MCV 88  MCH 29.6  MCHC 33.6  RDW  15.6  Neutrophil % 89.9  Lymphocyte % 7.5  Monocyte % 2.5  Eosinophil % 0.0  Basophil %  0.1  Neutrophil #  9.2  Lymphocyte #  0.8  Monocyte # 0.3  Eosinophil # 0.0  Basophil # 0.0 (Result(s) reported on 23 Feb 2014 at [facility].)   Radiology Results: Cardiology:    18-Feb-16 14:12, ED ECG  Ventricular Rate 40  Atrial Rate 40  P-R Interval 180  QRS Duration 112  QT 574  QTc 467  P Axis 36  R Axis 23  T Axis 30  ECG interpretation   Marked sinus bradycardia  Incomplete right bundle branch block  ST & T wave abnormality, consider anterior ischemia  Abnormal ECG  When compared with ECG  of 22-May-2011 12:02,  No significant change was found  ----------unconfirmed----------  Confirmed by OVERREAD, NOT ([id number]), editor PEARSON, BARBARA ([id number]) on 02/22/2014 10:11:28 AM  ED ECG    Assessment/Plan:  Assessment/Plan:  Assessment respiratory failure  COPD with  severe exacerbation  shortness of breath  congestion  obesity  obstructive sleep apnea  diabetes  hypertension  hyperlipidemia .   Plan continue history support  continue inhalers as necessary  supplemental oxygen  CPAP therapy for obstructive sleep prior  antibiotic therapy  statin therapy  steroid taper  continue telemetry  recommend permanent pacemaker placement for symptomatic bradycardia  continue diabetes therapy   Electronic Signatures: Lujean Amel D (MD)  (Signed 21-Feb-16 12:12)  Authored: Chief Complaint, VITAL SIGNS/ANCILLARY NOTES, Brief Assessment, Lab Results, Radiology Results, Assessment/Plan   Last Updated: 21-Feb-16 12:12 by Lujean Amel D (MD)

## 2014-05-05 NOTE — Consult Note (Signed)
PATIENT NAME:  Kathryn Hayes, Kathryn Hayes MR#:  [id number] DATE OF BIRTH:  1934/11/18  DATE OF CONSULTATION:  02/22/2014  REFERRING PHYSICIAN:   CONSULTING PHYSICIAN:  Kaylin Schellenberg D. Clayborn Bigness, MD  PRIMARY CARE PHYSICIAN: Youlanda Roys. Lovie Macadamia, MD  CARDIOLOGIST:  INDICATION FOR CONSULTATION: Shortness of breath,  and bradycardia.   HISTORY OF PRESENT ILLNESS: The patient is a 80 year old white female with COPD, respiratory failure on 2 liters home oxygen, obstructive sleep apnea, diabetes type 2, hyperlipidemia,, who reports that she woke up hooked to CPAP, as usual, and tried to get out of bed and felt like she had locked up. She stated that she could not move her legs  did that, she had no strength. She stayed like this for about an hour,  she was fine and able to get to the phone and call the EMS. She also  again felt very short of breath at the time, unable to talk. Her mouth was dry saw. She denied any significant chest pain. Reports that for the past few days, she felt cold.  She has had a cold with cough white sputum, dry throat. She has been taking a prescription cough medication, Tussionex, to help with congestion and cough. She was acutely hypoxic with saturations about 90. She got treated with  as well as nebulizer  She was found to have heart rates that were low up to 50- 40s, not on any beta blockers, and she was admitted for . evaluation. No syncope or significant blackout spells.   PAST MEDICAL HISTORY: Diabetes, hypertension hyperlipidemia, COPD, GERD, , anxiety, depression, colon cancer, chronic respiratory failure, hypoxemia, obstructive sleep apnea, history of breast cancer.   PAST SURGICAL HISTORY:  Colon surgery  breast cancer , right total knee, bilateral tubal ligation.   SOCIAL HISTORY: Lives by herself. Uses a walker.   machine. No    FAMILY HISTORY: She was unable to provide her family history. Colon cancer, DVT, end-stage renal disease,   ALLERGIES: None.  MEDICATIONS: Vitamin B12,  trazadone 50 mg 2 tablets at bedtime, 4 mg  tramadol 50 mg as needed  every 4 hours, Spiriva once a day, Pravachol 40 mg once a day, Protonix 40 mg twice a day,  twice a day with meals,  5 mg 3 times a day as needed with meals,  750 twice a day, 800 mg twice a day, lorazepam 1 tablet 3 times a day, glimepiride 4 mg once a day, gabapentin 400 mg 3 times a day, Lasix day,  ferrous sulfate 325 once a day,  20 mg once a day, Cymbalta 60 mg once day, amlodipine 10 mg a day, albuterol inhaler 2 puffs as needed, Actos 30 mg once a day.   REVIEW OF SYSTEMS: Positive for chills, cough, occasional sweats, white sputum. Denies nausea, vomiting,  . Denies  diarrhea, bleeding. No wgt loss no wgt gain No edema no vertigo + weakness  Denies cp sob No vision change No sweating  PHYSICAL EXAMINATION:  VITAL SIGNS: Blood pressure 150/70, pulse of 45, respiratory rate of 16  HEENT: Normocephalic, atraumatic, equal to light.   rales or rhonchi.  rales.  Resp CTA no wheezing no rales HEART: regular bradycardia.2/6  murmurb STB border  ABDOMEN: Benign + BS no abd pain EXT no edema good pulses   LABORATORY DATA: Sodium 138, potassium 4.2, chloride 97, bicarbonate 37, BUN 14, creatinine . BNP was 467, calcium 9.2. CK and troponin were normal. White count of 8.3, hemoglobin 10, platelet count 204,000. Chest x-ray  shows right pleural effusion, otherwise negative. Operated. EKG shows sinus bradycardia nonspecific ST-T wave changes. EKG shows sinus bradycardia and nonspecific ST or T changes.   ASSESSMENT: Bradycardia, chronic obstructive pulmonary disease, hypoxemia, pleural effusion, diabetes, reflux, obstructive sleep apnea, anxiety, obesity, hypertension, and hyperlipidemia.  PLAN: 1.R/O MI   obtained rule out for maintain telemetry. I do not recommend pacemaker at this point. We will continue to follow. She may qualify for a permanent pacemaker in the near future, but  and continue hypertension control.  2.  Continue respiratory therapy with inhalers, steroid therapy, antibiotics. Consider pulmonary input for further management and care.  3. May recommend diuresis for effusion, which may be contributing to his shortness of breath.  4. Continue diabetes management as we are doing with glimepiride, Actos, and metformin. Would probably add sliding scale insulin as well.  5. For anxiety, continue lorazepam.   6. For reflux, continue Protonix.  7. Recommend DVT prophylaxis.  8. Continue CPAP therapy.  9. For obstructive sleep apnea,  continue to follow the patient. Again, I do not recommend any direct cardiac intervention at this point. We will continue to watch her on telemetry. Possible need for pacemaker if she becomes symptomatic.    ____________________________ Loran Senters. Clayborn Bigness, MD ddc:mw D: 02/23/2014 08:55:57 ET T: 02/23/2014 11:17:18 ET JOB#: [id number]  cc: Peyson Postema D. Clayborn Bigness, MD, <Dictator> Yolonda Kida MD ELECTRONICALLY SIGNED 03/25/2014 13:30

## 2014-05-08 ENCOUNTER — Inpatient Hospital Stay: Payer: Medicare Other | Admitting: Pulmonary Disease

## 2014-05-16 ENCOUNTER — Encounter: Payer: Self-pay | Admitting: Pulmonary Disease

## 2014-05-16 ENCOUNTER — Ambulatory Visit (INDEPENDENT_AMBULATORY_CARE_PROVIDER_SITE_OTHER): Payer: Medicare Other | Admitting: Pulmonary Disease

## 2014-05-16 VITALS — BP 126/68 | HR 65 | Temp 98.1°F | Ht 61.0 in | Wt 226.0 lb

## 2014-05-16 DIAGNOSIS — J9611 Chronic respiratory failure with hypoxia: Secondary | ICD-10-CM | POA: Diagnosis not present

## 2014-05-16 DIAGNOSIS — J438 Other emphysema: Secondary | ICD-10-CM | POA: Diagnosis not present

## 2014-05-16 DIAGNOSIS — I5032 Chronic diastolic (congestive) heart failure: Secondary | ICD-10-CM

## 2014-05-16 DIAGNOSIS — J449 Chronic obstructive pulmonary disease, unspecified: Secondary | ICD-10-CM

## 2014-05-16 DIAGNOSIS — J90 Pleural effusion, not elsewhere classified: Secondary | ICD-10-CM

## 2014-05-16 DIAGNOSIS — J310 Chronic rhinitis: Secondary | ICD-10-CM

## 2014-05-16 MED ORDER — DOXYCYCLINE HYCLATE 100 MG PO TABS: 100.0000 mg | ORAL_TABLET | Freq: Two times a day (BID) | ORAL | Status: DC

## 2014-05-16 MED ORDER — IPRATROPIUM BROMIDE 0.03 % NA SOLN: 2.0000 | Freq: Four times a day (QID) | NASAL | Status: DC

## 2014-05-16 MED ORDER — FLUTTER DEVI: Status: DC

## 2014-05-16 NOTE — Assessment & Plan Note (Signed)
It has been nearly 2 years since I saw Kathryn Hayes last and clearly things have worsened. However, it's not entirely clear to me that has been her COPD which is worsened as per my review of her records which are available it seems that she has developed congestive heart failure complications in the last several months. She now has a pacemaker and she appears to be on more heart failure type medications in the last time I saw her. Today on physical exam she appears euvolemic.  However, she does continue to have some mucus production and rhonchi on exam, so I think she has some lingering bronchitis after her recent hospitalization for pneumonia.  Plan: Continue Spiriva Doxycycline 5 days Take doxycycline with a probiotic Obtain chest x-ray to ensure pneumonia has cleared up Spirometry today to assess if the airflow obstruction has worsened in the last 2 years

## 2014-05-16 NOTE — Assessment & Plan Note (Signed)
Fortunately this problem has not dramatically worsened since I saw her last. She should continue 2 L at rest and 3 L with exertion

## 2014-05-16 NOTE — Assessment & Plan Note (Signed)
She had bilateral pleural effusions on chest imaging from February 2016. Apparently she had a thoracentesis but I am not able to review the records from this. Presumably the bilateral pleural effusions were related to congestive heart failure based on the images and I've been able to review and history taken from her and her son.  Today she is not appear to have pleural effusions based on her physical exam.  Plan: Chest x-ray to evaluate for presence of the effusions Obtain records from the thoracentesis results

## 2014-05-16 NOTE — Patient Instructions (Signed)
We will request records from [facility] from your recent hospital visit Continue taking your heart medications as you were doing Use the nasal spray 1 spray each nostril every 3-4 hours as needed for the runny nose Take over-the-counter Mucinex twice a day Use the flutter valve 4-5 times a day as we described in clinic Continue taking Spiriva daily We will order a chest x-ray and call you with the results We will see you back in 6 weeks or sooner if needed

## 2014-05-16 NOTE — Progress Notes (Signed)
Synopsis: Kathryn Hayes was diagnosed with COPD in 2007 and came to the [facility] for the first time in 2013.  She has been hospitalized for a COPD flare in the past.  He uses 2 L O2 continuously.  12/2010 Full PFT [facility]: Ratio 70%, FEV1 1.2 L (65%) clear obstruction on flow volume loop, TLC normal, RV 169% DLCO 41%    HPI  Chief Complaint  Patient presents with  . Hospitalization Follow-up    L/s 2014.  Pt recently seen at [facility] for several breathing issues- fluid in lungs, persistent cough, dizziness.  Pt's nonprod cough is still present.    Kathryn Hayes was hosptialized several times int he last few months.  She was hospitlzied for a "diabetic coma".  She had a pacemaker and a thoracentesis.  She is feeling better since leaving the hospital.  She is taking lasix once a day and she says that her UOP is adequate.  She was most recently hospitalized 3 weeks ago and was told she had pneumonia that time.   She said that she had pneumonia in the hospital as well.  Since then she has been treated with antibiotics and steroids.    She says that the steroid has helped some. Her breathing has imrpoved somewhat.  She continues to have a cough but it is dry, she has sinus congestion now.    She denies recent fever or chills   Past Medical History  Diagnosis Date  . Diabetes mellitus   . Hyperlipidemia   . Hypertension   . Chronic headache   . Breast cancer   . COPD (chronic obstructive pulmonary disease)     Review of Systems  Constitutional: Positive for fatigue. Negative for fever, chills and unexpected weight change.  HENT: Positive for congestion, postnasal drip, rhinorrhea and sneezing. Negative for nosebleeds.   Respiratory: Positive for cough. Negative for choking, chest tightness, shortness of breath and wheezing.   Cardiovascular: Positive for leg swelling. Negative for chest pain and palpitations.      Objective:   Physical Exam  Filed Vitals:   05/16/14 1617  BP: 126/68   Pulse: 65  Temp: 98.1 F (36.7 C)  TempSrc: Oral  Height: 5\' 1"  (1.549 m)  Weight: 226 lb (102.513 kg)  SpO2: 96%  3 L nasal cannula  O2 saturation with 2L > 94% at rest; with walking on 3 L > 94%  Gen: chronically ill appearing female in wheelchair HENT: OP clear, TM's clear, neck supple PULM: rhonchi bilaterally, no wheezing, some crackles in bases CV: RRR, distant, cannot assess JVD, mild pretibial edema GI: BS+, soft, nontender Derm: no cyanosis or rash Psyche: normal mood and affect    Review of January 2013 CXR : hyperinflation, increased pulmonary vascularity  CT Chest January 2013: no pe, no clear infiltrate or significant emphysema  12/2010 Full PFT [facility]: Ratio 70%, FEV1 1.2 L (65%) clear obstruction on flow volume loop, TLC normal, RV 169% DLCO 41%  February 2016 chest x-ray images reviewed bilateral pleural effusion, bilateral airspace disease likely consistent with pulmonary edema April 2016 chest x-ray images reviewed there has been improvement in the bilateral pleural effusions and pulmonary edema, there is now a pacemaker in place April 2016 lab work reviewed> surprisingly normal kidney function, hemoglobin 11.1  Assessment & Plan:   COPD (chronic obstructive pulmonary disease) It has been nearly 2 years since I saw Kathryn Hayes last and clearly things have worsened. However, it's not entirely clear to me that has been  her COPD which is worsened as per my review of her records which are available it seems that she has developed congestive heart failure complications in the last several months. She now has a pacemaker and she appears to be on more heart failure type medications in the last time I saw her. Today on physical exam she appears euvolemic.  However, she does continue to have some mucus production and rhonchi on exam, so I think she has some lingering bronchitis after her recent hospitalization for pneumonia.  Plan: Continue Spiriva Doxycycline 5  days Take doxycycline with a probiotic Obtain chest x-ray to ensure pneumonia has cleared up Spirometry today to assess if the airflow obstruction has worsened in the last 2 years   Hypoxemic respiratory failure, chronic Fortunately this problem has not dramatically worsened since I saw her last. She should continue 2 L at rest and 3 L with exertion   Congestive heart failure Kathryn Hayes has clearly suffered from complications from congestive heart failure in the last several months. I have personally reviewed x-rays from February 2016 which showed clear pulmonary edema and bilateral pleural effusions. Apparently this was complicated by an arrhythmia because she now has a dual-chamber pacemaker.  Today on physical exam she does appear euvolemic. Her cardiac medications appear appropriate and she has been taking them regularly.  Plan: Continue metoprolol, Mavik, and the Lasix If her cough has not improved then we may consider changing her from the [facility] to an ARB We will obtain records from her recent hospital stay so we can see her LV function on the most recent echocardiogram   Pleural effusion She had bilateral pleural effusions on chest imaging from February 2016. Apparently she had a thoracentesis but I am not able to review the records from this. Presumably the bilateral pleural effusions were related to congestive heart failure based on the images and I've been able to review and history taken from her and her son.  Today she is not appear to have pleural effusions based on her physical exam.  Plan: Chest x-ray to evaluate for presence of the effusions Obtain records from the thoracentesis results     Updated Medication List Outpatient Encounter Prescriptions as of 05/16/2014  Medication Sig  . albuterol (PROVENTIL) (2.5 MG/3ML) 0.083% nebulizer solution Take 2.5 mg by nebulization every 4 (four) hours as needed.  Marland Kitchen amLODipine (NORVASC) 10 MG tablet Take 10 mg by mouth daily.   Marland Kitchen aspirin 81 MG tablet Take 81 mg by mouth daily.  . benzonatate (TESSALON) 100 MG capsule Take by mouth 3 (three) times daily as needed for cough.  . clotrimazole (MYCELEX) 10 MG troche Take 10 mg by mouth 4 (four) times daily.  Marland Kitchen docusate sodium (COLACE) 100 MG capsule Take 100 mg by mouth 2 (two) times daily.  . DULoxetine (CYMBALTA) 60 MG capsule Take 60 mg by mouth daily.  Marland Kitchen escitalopram (LEXAPRO) 20 MG tablet Take 20 mg by mouth daily.  . ferrous fumarate (HEMOCYTE - 106 MG FE) 325 (106 FE) MG TABS tablet Take 1 tablet by mouth daily.  . Fluticasone-Salmeterol (ADVAIR) 250-50 MCG/DOSE AEPB Inhale 1 puff into the lungs 2 (two) times daily.  . furosemide (LASIX) 20 MG tablet Take 20 mg by mouth daily.  Marland Kitchen gabapentin (NEURONTIN) 400 MG capsule Take 400 mg by mouth 3 (three) times daily.  Marland Kitchen glimepiride (AMARYL) 4 MG tablet Take 4 mg by mouth daily.  Marland Kitchen HYDROcodone-acetaminophen (NORCO/VICODIN) 5-325 MG per tablet Take 1 tablet by mouth every 4 (  four) hours as needed for moderate pain.  Marland Kitchen LORazepam (ATIVAN) 1 MG tablet Take 1 mg by mouth 3 (three) times daily as needed.  . magnesium oxide (MAG-OX) 400 MG tablet Take 400 mg by mouth daily.  . metFORMIN (GLUCOPHAGE-XR) 750 MG 24 hr tablet Take 750 mg by mouth 3 (three) times daily.  . metoCLOPramide (REGLAN) 5 MG tablet Take 5 mg by mouth 3 (three) times daily before meals.  . metoprolol tartrate (LOPRESSOR) 25 MG tablet Take 25 mg by mouth 2 (two) times daily.  . OXYGEN Inhale 3-4 L into the lungs. At all times for COPD  . pantoprazole (PROTONIX) 40 MG tablet Take 40 mg by mouth 2 (two) times daily.   . pioglitazone (ACTOS) 30 MG tablet Take 30 mg by mouth daily.  . pravastatin (PRAVACHOL) 40 MG tablet Take 40 mg by mouth daily.  . predniSONE (DELTASONE) 10 MG tablet Take 10 mg by mouth daily with breakfast. 5 tabs orally once a day x 3 days 4 tabs orally once a day x 3 days 3 tabs orally once a day x 3 days 2 tabs orally once a day x 3  days 1 tab orally once a day x 3 days  . sucralfate (CARAFATE) 1 G tablet Take 1 g by mouth 4 (four) times daily -  with meals and at bedtime.  Marland Kitchen tiotropium (SPIRIVA) 18 MCG inhalation capsule Place 1 capsule (18 mcg total) into inhaler and inhale daily.  . traMADol (ULTRAM) 50 MG tablet Take 50 mg by mouth 4 (four) times daily as needed.  . trandolapril (MAVIK) 4 MG tablet Take 4 mg by mouth daily.  . traZODone (DESYREL) 50 MG tablet Take 100 mg by mouth at bedtime.  . vitamin B-12 (CYANOCOBALAMIN) 1000 MCG tablet Take 1,000 mcg by mouth daily.  Marland Kitchen doxycycline (VIBRA-TABS) 100 MG tablet Take 1 tablet (100 mg total) by mouth 2 (two) times daily.  Marland Kitchen ipratropium (ATROVENT) 0.03 % nasal spray Place 2 sprays into the nose 4 (four) times daily.  . potassium chloride (K-DUR) 10 MEQ tablet Take 2 tablets (20 mEq total) by mouth daily.  Marland Kitchen Respiratory Therapy Supplies (FLUTTER) DEVI Use as directed  . [DISCONTINUED] azithromycin (ZITHROMAX) 500 MG tablet Take 500 mg by mouth daily.  . [DISCONTINUED] furosemide (LASIX) 40 MG tablet Take 1 tablet (40 mg total) by mouth daily.  . [DISCONTINUED] pantoprazole (PROTONIX) 40 MG tablet Take 40 mg by mouth 2 (two) times daily.   No facility-administered encounter medications on file as of 05/16/2014.

## 2014-05-16 NOTE — Assessment & Plan Note (Signed)
She has vasomotor rhinitis, start ipratropium nasal spray  Vasomotor rhinitis (chronic non-allergic rhinitis): When inhaled corticosteroids or antihistamines are not helpful, ipratropium nasal spray (0.03%, 2 puffs each nare tid) is often effective as demonstrated in two trials of 285 and 253 patients (J Allergy Clin Immunol. 9244;62(8 Pt 2):1123. nd J Allergy Clin Immunol. 6381;77(1 Pt 2):1117).  -start ipratropium nasal spray (0.03%) 2 puffs bid-tid prn

## 2014-05-16 NOTE — Assessment & Plan Note (Signed)
Kathryn Hayes has clearly suffered from complications from congestive heart failure in the last several months. I have personally reviewed x-rays from February 2016 which showed clear pulmonary edema and bilateral pleural effusions. Apparently this was complicated by an arrhythmia because she now has a dual-chamber pacemaker.  Today on physical exam she does appear euvolemic. Her cardiac medications appear appropriate and she has been taking them regularly.  Plan: Continue metoprolol, Mavik, and the Lasix If her cough has not improved then we may consider changing her from the [facility] to an ARB We will obtain records from her recent hospital stay so we can see her LV function on the most recent echocardiogram

## 2014-05-21 ENCOUNTER — Ambulatory Visit
Admission: RE | Admit: 2014-05-21 | Discharge: 2014-05-21 | Disposition: A | Discharge status: Home / Self Care | Payer: Medicare Other | Source: Ambulatory Visit | Attending: Pulmonary Disease | Admitting: Pulmonary Disease

## 2014-05-21 ENCOUNTER — Other Ambulatory Visit: Payer: Self-pay | Admitting: Pulmonary Disease

## 2014-05-21 DIAGNOSIS — J9811 Atelectasis: Secondary | ICD-10-CM | POA: Diagnosis not present

## 2014-05-21 DIAGNOSIS — J449 Chronic obstructive pulmonary disease, unspecified: Secondary | ICD-10-CM | POA: Insufficient documentation

## 2014-05-21 DIAGNOSIS — M8580 Other specified disorders of bone density and structure, unspecified site: Secondary | ICD-10-CM | POA: Diagnosis not present

## 2014-05-21 DIAGNOSIS — M47814 Spondylosis without myelopathy or radiculopathy, thoracic region: Secondary | ICD-10-CM | POA: Insufficient documentation

## 2014-05-21 DIAGNOSIS — Z950 Presence of cardiac pacemaker: Secondary | ICD-10-CM | POA: Insufficient documentation

## 2014-05-21 DIAGNOSIS — J90 Pleural effusion, not elsewhere classified: Secondary | ICD-10-CM | POA: Diagnosis not present

## 2015-08-15 ENCOUNTER — Emergency Department: Payer: Medicare Other

## 2015-08-15 ENCOUNTER — Inpatient Hospital Stay
Admission: EM | Admit: 2015-08-15 | Discharge: 2015-08-25 | DRG: 291 | Disposition: A | Discharge status: Skilled Nursing Facility | Payer: Medicare Other | Attending: Internal Medicine | Admitting: Internal Medicine

## 2015-08-15 DIAGNOSIS — E119 Type 2 diabetes mellitus without complications: Secondary | ICD-10-CM | POA: Diagnosis present

## 2015-08-15 DIAGNOSIS — J440 Chronic obstructive pulmonary disease with acute lower respiratory infection: Secondary | ICD-10-CM | POA: Diagnosis present

## 2015-08-15 DIAGNOSIS — I110 Hypertensive heart disease with heart failure: Principal | ICD-10-CM | POA: Diagnosis present

## 2015-08-15 DIAGNOSIS — Z79899 Other long term (current) drug therapy: Secondary | ICD-10-CM

## 2015-08-15 DIAGNOSIS — R339 Retention of urine, unspecified: Secondary | ICD-10-CM | POA: Diagnosis present

## 2015-08-15 DIAGNOSIS — I5022 Chronic systolic (congestive) heart failure: Secondary | ICD-10-CM

## 2015-08-15 DIAGNOSIS — E875 Hyperkalemia: Secondary | ICD-10-CM | POA: Diagnosis present

## 2015-08-15 DIAGNOSIS — Z803 Family history of malignant neoplasm of breast: Secondary | ICD-10-CM

## 2015-08-15 DIAGNOSIS — Z7982 Long term (current) use of aspirin: Secondary | ICD-10-CM

## 2015-08-15 DIAGNOSIS — I509 Heart failure, unspecified: Secondary | ICD-10-CM

## 2015-08-15 DIAGNOSIS — J9602 Acute respiratory failure with hypercapnia: Secondary | ICD-10-CM | POA: Diagnosis present

## 2015-08-15 DIAGNOSIS — Z7984 Long term (current) use of oral hypoglycemic drugs: Secondary | ICD-10-CM

## 2015-08-15 DIAGNOSIS — Z9981 Dependence on supplemental oxygen: Secondary | ICD-10-CM

## 2015-08-15 DIAGNOSIS — Z950 Presence of cardiac pacemaker: Secondary | ICD-10-CM

## 2015-08-15 DIAGNOSIS — E785 Hyperlipidemia, unspecified: Secondary | ICD-10-CM | POA: Diagnosis present

## 2015-08-15 DIAGNOSIS — J9601 Acute respiratory failure with hypoxia: Secondary | ICD-10-CM | POA: Diagnosis present

## 2015-08-15 DIAGNOSIS — E871 Hypo-osmolality and hyponatremia: Secondary | ICD-10-CM | POA: Diagnosis present

## 2015-08-15 DIAGNOSIS — I959 Hypotension, unspecified: Secondary | ICD-10-CM | POA: Diagnosis present

## 2015-08-15 DIAGNOSIS — J441 Chronic obstructive pulmonary disease with (acute) exacerbation: Secondary | ICD-10-CM | POA: Diagnosis present

## 2015-08-15 DIAGNOSIS — J209 Acute bronchitis, unspecified: Secondary | ICD-10-CM | POA: Diagnosis present

## 2015-08-15 DIAGNOSIS — Z800 Family history of malignant neoplasm of digestive organs: Secondary | ICD-10-CM

## 2015-08-15 DIAGNOSIS — R0602 Shortness of breath: Secondary | ICD-10-CM

## 2015-08-15 DIAGNOSIS — Z853 Personal history of malignant neoplasm of breast: Secondary | ICD-10-CM

## 2015-08-15 DIAGNOSIS — I5043 Acute on chronic combined systolic (congestive) and diastolic (congestive) heart failure: Secondary | ICD-10-CM | POA: Diagnosis present

## 2015-08-15 DIAGNOSIS — G4730 Sleep apnea, unspecified: Secondary | ICD-10-CM | POA: Diagnosis present

## 2015-08-15 LAB — GLUCOSE, CAPILLARY
GLUCOSE-CAPILLARY: 168 mg/dL — AB (ref 65–99)
Glucose-Capillary: 149 mg/dL — ABNORMAL HIGH (ref 65–99)

## 2015-08-15 LAB — COMPREHENSIVE METABOLIC PANEL
ALBUMIN: 3.1 g/dL — AB (ref 3.5–5.0)
ALK PHOS: 108 U/L (ref 38–126)
ALT: 10 U/L — AB (ref 14–54)
AST: 12 U/L — AB (ref 15–41)
Anion gap: 4 — ABNORMAL LOW (ref 5–15)
BUN: 10 mg/dL (ref 6–20)
CALCIUM: 7.7 mg/dL — AB (ref 8.9–10.3)
CO2: 36 mmol/L — AB (ref 22–32)
CREATININE: 0.57 mg/dL (ref 0.44–1.00)
Chloride: 88 mmol/L — ABNORMAL LOW (ref 101–111)
GFR calc Af Amer: >60 mL/min (ref >60)
GFR calc non Af Amer: >60 mL/min (ref >60)
GLUCOSE: 181 mg/dL — AB (ref 65–99)
Potassium: 5.3 mmol/L — ABNORMAL HIGH (ref 3.5–5.1)
SODIUM: 128 mmol/L — AB (ref 135–145)
Total Bilirubin: 0.7 mg/dL (ref 0.3–1.2)
Total Protein: 5.8 g/dL — ABNORMAL LOW (ref 6.5–8.1)

## 2015-08-15 LAB — TROPONIN I
TROPONIN I: 0.03 ng/mL — AB (ref <0.03)
TROPONIN I: 0.03 ng/mL — AB (ref <0.03)
Troponin I: 0.03 ng/mL (ref <0.03)

## 2015-08-15 LAB — CBC
HCT: 33.5 % — ABNORMAL LOW (ref 35.0–47.0)
HEMOGLOBIN: 10.7 g/dL — AB (ref 12.0–16.0)
MCH: 28.6 pg (ref 26.0–34.0)
MCHC: 32 g/dL (ref 32.0–36.0)
MCV: 89.5 fL (ref 80.0–100.0)
Platelets: 180 10*3/uL (ref 150–440)
RBC: 3.74 MIL/uL — AB (ref 3.80–5.20)
RDW: 14.6 % — ABNORMAL HIGH (ref 11.5–14.5)
WBC: 7.3 10*3/uL (ref 3.6–11.0)

## 2015-08-15 MED ORDER — BENZONATATE 100 MG PO CAPS
100.0000 mg | ORAL_CAPSULE | Freq: Three times a day (TID) | ORAL | Status: DC | PRN
  Administered 2015-08-22: 100 mg via ORAL
  Filled 2015-08-15: qty 1

## 2015-08-15 MED ORDER — IPRATROPIUM BROMIDE 0.03 % NA SOLN
2.0000 | Freq: Four times a day (QID) | NASAL | Status: DC
  Administered 2015-08-15 – 2015-08-25 (×34): 2 via NASAL
  Filled 2015-08-15 (×2): qty 30

## 2015-08-15 MED ORDER — ENOXAPARIN SODIUM 40 MG/0.4ML ~~LOC~~ SOLN
40.0000 mg | Freq: Two times a day (BID) | SUBCUTANEOUS | Status: DC
  Administered 2015-08-15 – 2015-08-25 (×20): 40 mg via SUBCUTANEOUS
  Filled 2015-08-15 (×21): qty 0.4

## 2015-08-15 MED ORDER — METOCLOPRAMIDE HCL 10 MG PO TABS
5.0000 mg | ORAL_TABLET | Freq: Three times a day (TID) | ORAL | Status: DC
  Administered 2015-08-15 – 2015-08-25 (×29): 5 mg via ORAL
  Filled 2015-08-15: qty 2
  Filled 2015-08-15 (×28): qty 1

## 2015-08-15 MED ORDER — FUROSEMIDE 10 MG/ML IJ SOLN
40.0000 mg | Freq: Once | INTRAMUSCULAR | Status: AC
  Administered 2015-08-15: 40 mg via INTRAVENOUS
  Filled 2015-08-15: qty 4

## 2015-08-15 MED ORDER — PANTOPRAZOLE SODIUM 40 MG PO TBEC
40.0000 mg | DELAYED_RELEASE_TABLET | Freq: Two times a day (BID) | ORAL | Status: DC
  Administered 2015-08-15 – 2015-08-25 (×20): 40 mg via ORAL
  Filled 2015-08-15 (×20): qty 1

## 2015-08-15 MED ORDER — FUROSEMIDE 10 MG/ML IJ SOLN
20.0000 mg | Freq: Every day | INTRAMUSCULAR | Status: DC
  Administered 2015-08-15: 20 mg via INTRAVENOUS
  Filled 2015-08-15 (×2): qty 2

## 2015-08-15 MED ORDER — ONDANSETRON HCL 4 MG/2ML IJ SOLN
4.0000 mg | Freq: Four times a day (QID) | INTRAMUSCULAR | Status: DC | PRN
  Administered 2015-08-20: 4 mg via INTRAVENOUS
  Filled 2015-08-15: qty 2

## 2015-08-15 MED ORDER — INSULIN ASPART 100 UNIT/ML ~~LOC~~ SOLN
0.0000 [IU] | Freq: Three times a day (TID) | SUBCUTANEOUS | Status: DC
  Administered 2015-08-15 – 2015-08-17 (×3): 2 [IU] via SUBCUTANEOUS
  Administered 2015-08-17: 4 [IU] via SUBCUTANEOUS
  Administered 2015-08-18: 2 [IU] via SUBCUTANEOUS
  Administered 2015-08-18 (×2): 4 [IU] via SUBCUTANEOUS
  Administered 2015-08-19: 6 [IU] via SUBCUTANEOUS
  Administered 2015-08-19 – 2015-08-22 (×4): 2 [IU] via SUBCUTANEOUS
  Administered 2015-08-22: 4 [IU] via SUBCUTANEOUS
  Administered 2015-08-22: 2 [IU] via SUBCUTANEOUS
  Administered 2015-08-23: 4 [IU] via SUBCUTANEOUS
  Administered 2015-08-24 – 2015-08-25 (×2): 2 [IU] via SUBCUTANEOUS
  Filled 2015-08-15 (×3): qty 2
  Filled 2015-08-15: qty 4
  Filled 2015-08-15: qty 2
  Filled 2015-08-15: qty 6
  Filled 2015-08-15 (×2): qty 4
  Filled 2015-08-15: qty 2
  Filled 2015-08-15 (×2): qty 4
  Filled 2015-08-15 (×2): qty 2
  Filled 2015-08-15: qty 4
  Filled 2015-08-15 (×3): qty 2

## 2015-08-15 MED ORDER — ESCITALOPRAM OXALATE 10 MG PO TABS
20.0000 mg | ORAL_TABLET | Freq: Every day | ORAL | Status: DC
  Administered 2015-08-16 – 2015-08-25 (×10): 20 mg via ORAL
  Filled 2015-08-15 (×10): qty 2

## 2015-08-15 MED ORDER — SODIUM CHLORIDE 0.9% FLUSH
3.0000 mL | Freq: Two times a day (BID) | INTRAVENOUS | Status: DC
  Administered 2015-08-15 – 2015-08-20 (×10): 3 mL via INTRAVENOUS

## 2015-08-15 MED ORDER — TRANDOLAPRIL 4 MG PO TABS: 4.0000 mg | ORAL_TABLET | Freq: Every day | ORAL | Status: DC

## 2015-08-15 MED ORDER — METOPROLOL TARTRATE 25 MG PO TABS
25.0000 mg | ORAL_TABLET | Freq: Two times a day (BID) | ORAL | Status: DC
  Administered 2015-08-15 – 2015-08-18 (×7): 25 mg via ORAL
  Filled 2015-08-15 (×7): qty 1

## 2015-08-15 MED ORDER — SUCRALFATE 1 G PO TABS
1.0000 g | ORAL_TABLET | Freq: Three times a day (TID) | ORAL | Status: DC
  Administered 2015-08-15 – 2015-08-25 (×38): 1 g via ORAL
  Filled 2015-08-15 (×38): qty 1

## 2015-08-15 MED ORDER — PIOGLITAZONE HCL 30 MG PO TABS
30.0000 mg | ORAL_TABLET | Freq: Every day | ORAL | Status: DC
Start: 2015-08-15 — End: 2015-08-15

## 2015-08-15 MED ORDER — AMLODIPINE BESYLATE 10 MG PO TABS
10.0000 mg | ORAL_TABLET | Freq: Every day | ORAL | Status: DC
  Administered 2015-08-16 – 2015-08-18 (×3): 10 mg via ORAL
  Filled 2015-08-15 (×3): qty 1

## 2015-08-15 MED ORDER — DULOXETINE HCL 60 MG PO CPEP
60.0000 mg | ORAL_CAPSULE | Freq: Every day | ORAL | Status: DC
  Administered 2015-08-16 – 2015-08-25 (×10): 60 mg via ORAL
  Filled 2015-08-15 (×3): qty 1
  Filled 2015-08-15: qty 2
  Filled 2015-08-15 (×6): qty 1

## 2015-08-15 MED ORDER — TRAMADOL HCL 50 MG PO TABS
50.0000 mg | ORAL_TABLET | Freq: Four times a day (QID) | ORAL | Status: DC | PRN
  Administered 2015-08-18 – 2015-08-24 (×7): 50 mg via ORAL
  Filled 2015-08-15 (×7): qty 1

## 2015-08-15 MED ORDER — TRAZODONE HCL 100 MG PO TABS
100.0000 mg | ORAL_TABLET | Freq: Every day | ORAL | Status: DC
  Administered 2015-08-15 – 2015-08-24 (×10): 100 mg via ORAL
  Filled 2015-08-15: qty 1
  Filled 2015-08-15: qty 2
  Filled 2015-08-15 (×8): qty 1

## 2015-08-15 MED ORDER — SODIUM CHLORIDE 0.9% FLUSH: 3.0000 mL | INTRAVENOUS | Status: DC | PRN

## 2015-08-15 MED ORDER — MAGNESIUM OXIDE 400 (241.3 MG) MG PO TABS
400.0000 mg | ORAL_TABLET | Freq: Every day | ORAL | Status: DC
  Administered 2015-08-16 – 2015-08-25 (×10): 400 mg via ORAL
  Filled 2015-08-15 (×10): qty 1

## 2015-08-15 MED ORDER — GABAPENTIN 400 MG PO CAPS
400.0000 mg | ORAL_CAPSULE | Freq: Three times a day (TID) | ORAL | Status: DC
  Administered 2015-08-15 – 2015-08-25 (×29): 400 mg via ORAL
  Filled 2015-08-15 (×29): qty 1

## 2015-08-15 MED ORDER — VITAMIN B-12 1000 MCG PO TABS
1000.0000 ug | ORAL_TABLET | Freq: Every day | ORAL | Status: DC
  Administered 2015-08-16 – 2015-08-25 (×10): 1000 ug via ORAL
  Filled 2015-08-15 (×10): qty 1

## 2015-08-15 MED ORDER — ASPIRIN EC 81 MG PO TBEC
81.0000 mg | DELAYED_RELEASE_TABLET | Freq: Every day | ORAL | Status: DC
  Administered 2015-08-16 – 2015-08-25 (×10): 81 mg via ORAL
  Filled 2015-08-15 (×10): qty 1

## 2015-08-15 MED ORDER — CLOTRIMAZOLE 10 MG MT TROC
10.0000 mg | Freq: Four times a day (QID) | OROMUCOSAL | Status: DC
  Administered 2015-08-15 – 2015-08-25 (×37): 10 mg via ORAL
  Filled 2015-08-15 (×42): qty 1

## 2015-08-15 MED ORDER — FERROUS FUMARATE 324 (106 FE) MG PO TABS
1.0000 | ORAL_TABLET | Freq: Every day | ORAL | Status: DC
  Administered 2015-08-16 – 2015-08-25 (×10): 106 mg via ORAL
  Filled 2015-08-15 (×10): qty 1

## 2015-08-15 MED ORDER — TIOTROPIUM BROMIDE MONOHYDRATE 18 MCG IN CAPS
18.0000 ug | ORAL_CAPSULE | Freq: Every day | RESPIRATORY_TRACT | Status: DC
  Administered 2015-08-16 – 2015-08-25 (×10): 18 ug via RESPIRATORY_TRACT
  Filled 2015-08-15 (×2): qty 5

## 2015-08-15 MED ORDER — SODIUM CHLORIDE 0.9 % IV SOLN: 250.0000 mL | INTRAVENOUS | Status: DC | PRN

## 2015-08-15 MED ORDER — PRAVASTATIN SODIUM 40 MG PO TABS
40.0000 mg | ORAL_TABLET | Freq: Every day | ORAL | Status: DC
  Administered 2015-08-16 – 2015-08-25 (×10): 40 mg via ORAL
  Filled 2015-08-15 (×5): qty 1
  Filled 2015-08-15: qty 2
  Filled 2015-08-15 (×4): qty 1

## 2015-08-15 MED ORDER — ACETAMINOPHEN 325 MG PO TABS
650.0000 mg | ORAL_TABLET | ORAL | Status: DC | PRN
  Administered 2015-08-22: 650 mg via ORAL
  Filled 2015-08-15: qty 2

## 2015-08-15 MED ORDER — LORAZEPAM 1 MG PO TABS
1.0000 mg | ORAL_TABLET | Freq: Three times a day (TID) | ORAL | Status: DC | PRN
  Administered 2015-08-15 – 2015-08-22 (×4): 1 mg via ORAL
  Filled 2015-08-15 (×4): qty 1

## 2015-08-15 MED ORDER — MOMETASONE FURO-FORMOTEROL FUM 200-5 MCG/ACT IN AERO
2.0000 | INHALATION_SPRAY | Freq: Two times a day (BID) | RESPIRATORY_TRACT | Status: DC
  Administered 2015-08-15 – 2015-08-25 (×20): 2 via RESPIRATORY_TRACT
  Filled 2015-08-15: qty 8.8

## 2015-08-15 MED ORDER — ALBUTEROL SULFATE (2.5 MG/3ML) 0.083% IN NEBU: 2.5000 mg | INHALATION_SOLUTION | RESPIRATORY_TRACT | Status: DC | PRN

## 2015-08-15 MED ORDER — DOCUSATE SODIUM 100 MG PO CAPS
100.0000 mg | ORAL_CAPSULE | Freq: Two times a day (BID) | ORAL | Status: DC
  Administered 2015-08-15 – 2015-08-18 (×6): 100 mg via ORAL
  Filled 2015-08-15 (×6): qty 1

## 2015-08-15 NOTE — ED Triage Notes (Signed)
Pt came to ED via EMS from home c/o difficulty breathing, turning into panic attack. History of COPD. Per EMS, pt satting 91% 4L. On 4L all the time.

## 2015-08-15 NOTE — H&P (Signed)
[place] @ [facility] Admission History and Physical Harvie Bridge, D.O.  ---------------------------------------------------------------------------------------------------------------------   PATIENT NAME: Kathryn Hayes MR#: [id number] DATE OF BIRTH: Jan 28, 1934 DATE OF ADMISSION: 08/15/2015 PRIMARY CARE PHYSICIAN: Juluis Pitch, MD  REQUESTING/REFERRING PHYSICIAN: ED Dr. Corky Downs  CHIEF COMPLAINT: Chief Complaint  Patient presents with  . Shortness of Breath    HISTORY OF PRESENT ILLNESS: Kathryn Hayes is a 81 y.o. female with a known history of Home O2 dependent COPD, second degree heart block s/p PPM, diabetes, hypertension hyperlipidemia was in a usual state of health until yesterday when she began experiencing progressively worsening shortness of breath associated with lower extremity edema.  Her shortness of breath is associated with cough productive of clear to white sputum  She states that she may have had excess salt in her diet recently but cannot identify any other changes.  Functionally she is independent, usually walks around her home but has had increasing dyspnea on minimal exertion.  She denies any chest pain, recent fevers or chills.  Of note her son reports that yesterday she had difficulty urinating. She added that she had several instances where she attempted to urinate but only a few drops came out.  Otherwise there has been no change in status. Patient has been taking medication as prescribed and there has been no recent change in medication or diet.  There has been no recent illness, travel or sick contacts.    Patient denies fevers/chills, weakness, dizziness, chest pain, N/V/C/D, abdominal pain, dysuria/frequency, changes in mental status.   PAST MEDICAL HISTORY: Past Medical History:  Diagnosis Date  . Breast cancer ([place])   . Chronic headache   . COPD (chronic obstructive pulmonary disease) ([place])   . Diabetes mellitus   . Hyperlipidemia    . Hypertension     PAST SURGICAL HISTORY: Past Surgical History:  Procedure Laterality Date  . BREAST LUMPECTOMY  2007   left  . COLON SURGERY  1993  . TUBAL LIGATION  1971    SOCIAL history: Patient lives at home independently. She denies any alcohol tobacco or drug use.    FAMILY HISTORY: Family History  Problem Relation Age of Onset  . Factor V Leiden deficiency Sister   . Breast cancer Mother   . Colon cancer Mother      MEDICATIONS AT HOME: Prior to Admission medications   Medication Sig Start Date End Date Taking? Authorizing Provider  albuterol (PROVENTIL) (2.5 MG/3ML) 0.083% nebulizer solution Take 2.5 mg by nebulization every 4 (four) hours as needed.   Yes Historical Provider, MD  aspirin 81 MG tablet Take 81 mg by mouth daily.   Yes Historical Provider, MD  clotrimazole (MYCELEX) 10 MG troche Take 10 mg by mouth 4 (four) times daily.   Yes Historical Provider, MD  docusate sodium (COLACE) 100 MG capsule Take 100 mg by mouth 2 (two) times daily.   Yes Historical Provider, MD  DULoxetine (CYMBALTA) 60 MG capsule Take 60 mg by mouth daily.   Yes Historical Provider, MD  escitalopram (LEXAPRO) 20 MG tablet Take 20 mg by mouth daily.   Yes Historical Provider, MD  ferrous fumarate (HEMOCYTE - 106 MG FE) 325 (106 FE) MG TABS tablet Take 1 tablet by mouth daily.   Yes Historical Provider, MD  Fluticasone-Salmeterol (ADVAIR) 250-50 MCG/DOSE AEPB Inhale 1 puff into the lungs 2 (two) times daily.   Yes Historical Provider, MD  furosemide (LASIX) 20 MG tablet Take 20 mg by mouth daily.   Yes Historical  Provider, MD  gabapentin (NEURONTIN) 400 MG capsule Take 400 mg by mouth 3 (three) times daily.   Yes Historical Provider, MD  Hydrocortisone (GERHARDT'S BUTT CREAM) CREA Apply 1 application topically 2 (two) times daily.   Yes Historical Provider, MD  magnesium oxide (MAG-OX) 400 MG tablet Take 400 mg by mouth daily.   Yes Historical Provider, MD  metoCLOPramide (REGLAN) 5 MG  tablet Take 5 mg by mouth 3 (three) times daily before meals.   Yes Historical Provider, MD  metoprolol tartrate (LOPRESSOR) 25 MG tablet Take 25 mg by mouth 2 (two) times daily.   Yes Historical Provider, MD  nystatin (NYSTATIN) powder Apply topically 2 (two) times daily.   Yes Historical Provider, MD  pantoprazole (PROTONIX) 40 MG tablet Take 40 mg by mouth 2 (two) times daily.    Yes Historical Provider, MD  pravastatin (PRAVACHOL) 40 MG tablet Take 40 mg by mouth at bedtime.    Yes Historical Provider, MD  sucralfate (CARAFATE) 1 G tablet Take 1 g by mouth 4 (four) times daily -  with meals and at bedtime.   Yes Historical Provider, MD  tiotropium (SPIRIVA) 18 MCG inhalation capsule Place 1 capsule (18 mcg total) into inhaler and inhale daily. 12/05/12  Yes Juanito Doom, MD  vitamin B-12 (CYANOCOBALAMIN) 1000 MCG tablet Take 1,000 mcg by mouth daily.   Yes Historical Provider, MD  OXYGEN Inhale 3-4 L into the lungs. At all times for COPD    Historical Provider, MD  potassium chloride (K-DUR) 10 MEQ tablet Take 2 tablets (20 mEq total) by mouth daily. 05/20/11 05/19/12  Juanito Doom, MD  Respiratory Therapy Supplies (FLUTTER) DEVI Use as directed 05/16/14   Juanito Doom, MD      DRUG ALLERGIES: No Known Allergies   REVIEW OF SYSTEMS: CONSTITUTIONAL: No fever/chills, fatigue, weakness, weight gain/loss, headache EYES: No blurry or double vision. ENT: No tinnitus, postnasal drip, redness or soreness of the oropharynx. RESPIRATORY: Positive shortness of breath, cough. No wheeze, hemoptysis. CARDIOVASCULAR: No chest pain, orthopnea, palpitations, syncope. Positive lower extremity edema GASTROINTESTINAL: No nausea, vomiting, constipation, diarrhea, abdominal pain, hematemesis, melena or hematochezia. GENITOURINARY: No dysuria or hematuria. ENDOCRINE: No polyuria or nocturia. No heat or cold intolerance. HEMATOLOGY: No anemia, bruising, bleeding. INTEGUMENTARY: No rashes, ulcers,  lesions. MUSCULOSKELETAL: No arthritis, swelling, gout. NEUROLOGIC: No numbness, tingling, weakness or ataxia. No seizure-type activity. PSYCHIATRIC: No anxiety, depression, insomnia.  PHYSICAL EXAMINATION: VITAL SIGNS: Blood pressure (!) 149/96, pulse 92, temperature 98.9 F (37.2 C), temperature source Oral, resp. rate (!) 22, height 4\' 11"  (1.499 m), weight 113.4 kg (250 lb), SpO2 92 %.  GENERAL: 81 y.o.-year-old white female patient, well-developed, well-nourished lying in the bed in no acute distress. HEENT: Head atraumatic, normocephalic. Pupils equal, round, reactive to light and accommodation. No scleral icterus. Extraocular muscles intact. Nares are patent. Oropharynx is clear. Mucus membranes moist. NECK: Supple, full range of motion. No JVD, no bruit heard. No thyroid enlargement, no tenderness, no cervical lymphadenopathy. CHEST: Patient with difficulty breathing with minimal exertion including speaking but is able to speak in full sentences. Bibasilar crackles. No wheezing, rales, rhonchi. No use of accessory muscles of respiration.  No reproducible chest wall tenderness.  CARDIOVASCULAR: Distant heart sounds. S1, S2 normal. No murmurs, rubs, or gallops. Cap refill <2 seconds. ABDOMEN: Soft, nontender, nondistended. No rebound, guarding, rigidity. Normoactive bowel sounds present in all four quadrants. No organomegaly or mass. EXTREMITIES: Full range of motion. Pitting edema bilateral lower extremities to mid calf.  No cyanosis, or clubbing. NEUROLOGIC: Cranial nerves II through XII are grossly intact with no focal sensorimotor deficit. Muscle strength 5/5 in all extremities. Sensation intact. Gait not checked. PSYCHIATRIC: The patient is alert and oriented x 3. Normal affect, mood, thought content. SKIN: Warm, dry, and intact without obvious rash, lesion, or ulcer.  LABORATORY PANEL:  CBC  Recent Labs Lab 08/15/15 1303  WBC 7.3  HGB 10.7*  HCT 33.5*  PLT 180    ----------------------------------------------------------------------------------------------------------------- Chemistries  Recent Labs Lab 08/15/15 1303  NA 128*  K 5.3*  CL 88*  CO2 36*  GLUCOSE 181*  BUN 10  CREATININE 0.57  CALCIUM 7.7*  AST 12*  ALT 10*  ALKPHOS 108  BILITOT 0.7   ------------------------------------------------------------------------------------------------------------------ Cardiac Enzymes  Recent Labs Lab 08/15/15 1303  TROPONINI 0.03*   ------------------------------------------------------------------------------------------------------------------  RADIOLOGY: Dg Chest Portable 1 View  Result Date: 08/15/2015 CLINICAL DATA:  Shortness of breath. EXAM: PORTABLE CHEST 1 VIEW COMPARISON:  Radiographs of May 21, 2014. FINDINGS: Stable cardiomegaly. Left-sided pacemaker is unchanged in position. Atherosclerosis of thoracic aorta is noted. No pneumothorax is noted. Increased bibasilar opacities are noted concerning for edema or atelectasis with mild associated pleural effusions. Bony thorax is unremarkable. IMPRESSION: Increased bibasilar opacities are noted concerning for edema or atelectasis with mild associated pleural effusions. Aortic atherosclerosis. Electronically Signed   By: Marijo Conception, M.D.   On: 08/15/2015 13:45    EKG:  IMPRESSION AND PLAN:  This is a 81 y.o. female with a history of home O2 dependent COPD, diabetes, hyperlipidemia and hypertension, second-degree heart block status post pacemaker placement now being admitted with: 1. Acute exacerbation of congestive heart failure-I cannot locate, nor does the patient will call having had an echocardiogram. Symptoms may be related to increased salt intake. Patient has also had acute urinary retention which may be contributing as well. -Admit to telemetry for IV Lasix, strict I's and O's, serial troponins, cardiology consult (Dr. Ubaldo Glassing), echocardiogram.  Lipid profile for risk  stratification and TFTs to rule out underlying thyroid involvement. 2. Acute urinary retention -Foley catheter, urinalysis. 3. Hyponatremia, mild -Repeat BMP in the a.m. 4. Hyperkalemia, mild -Repeat BMP in a.m.  Admit: Telemetry Diagnoses: Acute exacerbation of CHF, acute urinary retention, hyponatremia, hypokalemia.  Condition: Stable Vitals: Per protocol Activity: As tolerated Diet/Nutrition: Heart healthy, carb controlled Fluids: Hep-Lock Home Meds: Continue all except metformin, Actos, by mouth Lasix Consults:  Cardiology DVT Px: Lovenox, SCDs and early ambulation  All the records are reviewed and case discussed with ED provider. Management plans discussed with the patient and/or family who express understanding and agree with plan of care.  CODE STATUS: Full TOTAL TIME TAKING CARE OF THIS PATIENT: 60 minutes.   Cowen Pesqueira D.O. on 08/15/2015 at 3:21 PM Between 7am to 6pm - Pager - [phone] After 6pm go to [email or web address] - [employer] [facility] Office [phone] CC: Primary care physician; Juluis Pitch, MD     Note: This dictation was prepared with Dragon dictation along with smaller phrase technology. Any transcriptional errors that result from this process are unintentional.

## 2015-08-15 NOTE — Consult Note (Signed)
[place]  CARDIOLOGY CONSULT NOTE  Patient ID: Kathryn Hayes MRN: [id number] DOB/AGE: 07-Aug-1934 81 y.o.  Admit date: 08/15/2015 Referring Physician Dr. Lavetta Nielsen Primary Physician Dr. Lovie Macadamia Primary Cardiologist Dr. Ubaldo Glassing Reason for Consultation CHF   HPI: Pt is a 81 yo female with history of sss with a dual chamber pacemaker set at lower rate of 60 and upper rate of 120, Histor of distollic chf, dm, hypertension and hyperlipidemia. She has oxygen dependent copd with oxygen requirments of 3-4 liters 24/7. She is now admitted after presenting to the er with sob and cxr revealed increased bibasilar opacities concerning for edema vs atelectasis with milld effusions. She states she has noted gradual increase in sob with worsening yesterday. She admits to no modifying her sodium intake. EF by functional study in 2016 was 70%. She has ruled out for an mi with negative troponin. She denies chest pain. She denies syncopoe or presyncope. She is compliant with her meds per her report. She takes metoprolol 25 bid; pravastatin 40 daily and trandolapril 2 mg bid.   Review of Systems  HENT: Negative.   Eyes: Negative.   Respiratory: Positive for shortness of breath.   Cardiovascular: Negative.   Gastrointestinal: Negative.   Genitourinary: Negative.   Musculoskeletal: Negative.   Skin: Negative.   Neurological: Positive for weakness.  Endo/Heme/Allergies: Negative.   Psychiatric/Behavioral: Negative.     Past Medical History:  Diagnosis Date  . Breast cancer ([place])   . Chronic headache   . COPD (chronic obstructive pulmonary disease) ([place])   . Diabetes mellitus   . Hyperlipidemia   . Hypertension     Family History  Problem Relation Age of Onset  . Factor V Leiden deficiency Sister   . Breast cancer Mother   . Colon cancer Mother     Social History   Social History  . Marital status: Divorced    Spouse name: N/A  . Number of children: N/A  .  Years of education: N/A   Occupational History  . Not on file.   Social History Main Topics  . Smoking status: Former Smoker    Packs/day: 1.50    Years: 50.00    Types: Cigarettes    Quit date: 10/05/2010  . Smokeless tobacco: Never Used  . Alcohol use No  . Drug use: Unknown  . Sexual activity: Not on file   Other Topics Concern  . Not on file   Social History Narrative  . No narrative on file    Past Surgical History:  Procedure Laterality Date  . BREAST LUMPECTOMY  2007   left  . COLON SURGERY  1993  . TUBAL LIGATION  1971     Prescriptions Prior to Admission  Medication Sig Dispense Refill Last Dose  . albuterol (PROVENTIL) (2.5 MG/3ML) 0.083% nebulizer solution Take 2.5 mg by nebulization every 4 (four) hours as needed.   prn at prn  . aspirin 81 MG tablet Take 81 mg by mouth daily.   08/15/2015 at 0900  . clotrimazole (MYCELEX) 10 MG troche Take 10 mg by mouth 4 (four) times daily.   08/15/2015 at 0900  . docusate sodium (COLACE) 100 MG capsule Take 100 mg by mouth 2 (two) times daily.   08/15/2015 at 0900  . DULoxetine (CYMBALTA) 60 MG capsule Take 60 mg by mouth daily.   08/15/2015 at 0900  . escitalopram (LEXAPRO) 20 MG tablet Take 20 mg by mouth daily.   08/15/2015 at 0900  .  ferrous fumarate (HEMOCYTE - 106 MG FE) 325 (106 FE) MG TABS tablet Take 1 tablet by mouth daily.   08/15/2015 at 0900  . Fluticasone-Salmeterol (ADVAIR) 250-50 MCG/DOSE AEPB Inhale 1 puff into the lungs 2 (two) times daily.   08/14/2015 at Unknown time  . furosemide (LASIX) 20 MG tablet Take 20 mg by mouth daily.   08/15/2015 at 0900  . gabapentin (NEURONTIN) 400 MG capsule Take 400 mg by mouth 3 (three) times daily.   08/15/2015 at 0900  . Hydrocortisone (GERHARDT'S BUTT CREAM) CREA Apply 1 application topically 2 (two) times daily.   08/15/2015 at 0900  . magnesium oxide (MAG-OX) 400 MG tablet Take 400 mg by mouth daily.   08/15/2015 at 0900  . metoCLOPramide (REGLAN) 5 MG tablet Take 5 mg by mouth  3 (three) times daily before meals.   08/15/2015 at 0900  . metoprolol tartrate (LOPRESSOR) 25 MG tablet Take 25 mg by mouth 2 (two) times daily.   08/15/2015 at 0900  . nystatin (NYSTATIN) powder Apply topically 2 (two) times daily.   08/15/2015 at 0900  . pantoprazole (PROTONIX) 40 MG tablet Take 40 mg by mouth 2 (two) times daily.    08/15/2015 at 0900  . pravastatin (PRAVACHOL) 40 MG tablet Take 40 mg by mouth at bedtime.    08/14/2015 at Unknown time  . sucralfate (CARAFATE) 1 G tablet Take 1 g by mouth 4 (four) times daily -  with meals and at bedtime.   08/15/2015 at 0900  . tiotropium (SPIRIVA) 18 MCG inhalation capsule Place 1 capsule (18 mcg total) into inhaler and inhale daily. 30 capsule 5 08/15/2015 at 0900  . vitamin B-12 (CYANOCOBALAMIN) 1000 MCG tablet Take 1,000 mcg by mouth daily.   08/15/2015 at 0900  . OXYGEN Inhale 3-4 L into the lungs. At all times for COPD   Taking  . potassium chloride (K-DUR) 10 MEQ tablet Take 2 tablets (20 mEq total) by mouth daily. 30 tablet 0   . Respiratory Therapy Supplies (FLUTTER) DEVI Use as directed 1 each 0     Physical Exam: Blood pressure (!) 119/59, pulse 90, temperature 98.4 F (36.9 C), temperature source Oral, resp. rate 16, height 4\' 11"  (1.499 m), weight 112.8 kg (248 lb 9.6 oz), SpO2 92 %.   Wt Readings from Last 1 Encounters:  08/15/15 112.8 kg (248 lb 9.6 oz)     General appearance: alert and cooperative Head: Normocephalic, without obvious abnormality, atraumatic Resp: diminished breath sounds bibasilar and rhonchi bibasilar Cardio: regular rate and rhythm GI: soft, non-tender; bowel sounds normal; no masses,  no organomegaly Extremities: extremities normal, atraumatic, no cyanosis or edema Neurologic: Grossly normal  Labs:   Lab Results  Component Value Date   WBC 7.3 08/15/2015   HGB 10.7 (L) 08/15/2015   HCT 33.5 (L) 08/15/2015   MCV 89.5 08/15/2015   PLT 180 08/15/2015    Recent Labs Lab 08/15/15 1303  NA 128*  K  5.3*  CL 88*  CO2 36*  BUN 10  CREATININE 0.57  CALCIUM 7.7*  PROT 5.8*  BILITOT 0.7  ALKPHOS 108  ALT 10*  AST 12*  GLUCOSE 181*   Lab Results  Component Value Date   CKTOTAL 35 (L) 05/02/2014   CKMB 2.8 05/02/2014   TROPONINI 0.03 (HH) 08/15/2015      Radiology: atelectasis vs edema with small efusions   ASSESSMENT AND PLAN:  81 yo with history of oxygen dependent copd, 2nd degree heart bock with ppm, admitted  with progressive sob and has probable at least mild volume overload on cxr. Does not weigh at home and is not aware if her weight has increased. No peripheral edema. She has improved iwht iv lasix. Had normal lv by myoview 1-2 years ago. Would carefully diurese and continue bronchodilators and oxygen. Will review echo when available. Continue amlodipine, metoprolol. Daily wieghts and low salt diet.  Signed: Teodoro Spray MD, [facility] 08/15/2015, 6:02 PM

## 2015-08-15 NOTE — ED Provider Notes (Signed)
[facility] Emergency Department Provider Note   ____________________________________________    I have reviewed the triage vital signs and the nursing notes.   HISTORY  Chief Complaint Shortness of Breath   HPI Kathryn Hayes is a 81 y.o. female who presents with complaints of shortness of breath. Patient is a history of COPD and CHF and reports she is on 4 L of oxygen continuously. Over the last 2 days she has had worsening shortness of breath especially with any exertion. She has felt swelling in her lower extremities. She denies fevers or chills, no cough. No recent travel.   Past Medical History:  Diagnosis Date  . Breast cancer ([place])   . Chronic headache   . COPD (chronic obstructive pulmonary disease) ([place])   . Diabetes mellitus   . Hyperlipidemia   . Hypertension     Patient Active Problem List   Diagnosis Date Noted  . Pleural effusion 05/16/2014  . 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 Barrett's esophagus 01/05/2012  . Congestive heart failure ([place]) 05/20/2011  . Hypoxemic respiratory failure, chronic ([place]) 03/10/2011  . COPD (chronic obstructive pulmonary disease) ([place])   . Hypertension   . Hyperlipidemia   . Diabetes mellitus     Past Surgical History:  Procedure Laterality Date  . BREAST LUMPECTOMY  2007   left  . COLON SURGERY  1993  . TUBAL LIGATION  1971    Prior to Admission medications   Medication Sig Start Date End Date Taking? Authorizing Provider  albuterol (PROVENTIL) (2.5 MG/3ML) 0.083% nebulizer solution Take 2.5 mg by nebulization every 4 (four) hours as needed.    Historical Provider, MD  amLODipine (NORVASC) 10 MG tablet Take 10 mg by mouth daily.    Historical Provider, MD  aspirin 81 MG tablet Take 81 mg by mouth daily.    Historical Provider, MD  benzonatate (TESSALON) 100 MG capsule Take by mouth 3 (three) times daily as needed for cough.    Historical Provider, MD  clotrimazole (MYCELEX) 10 MG troche Take  10 mg by mouth 4 (four) times daily.    Historical Provider, MD  docusate sodium (COLACE) 100 MG capsule Take 100 mg by mouth 2 (two) times daily.    Historical Provider, MD  doxycycline (VIBRA-TABS) 100 MG tablet Take 1 tablet (100 mg total) by mouth 2 (two) times daily. 05/16/14   Juanito Doom, MD  DULoxetine (CYMBALTA) 60 MG capsule Take 60 mg by mouth daily.    Historical Provider, MD  escitalopram (LEXAPRO) 20 MG tablet Take 20 mg by mouth daily.    Historical Provider, MD  ferrous fumarate (HEMOCYTE - 106 MG FE) 325 (106 FE) MG TABS tablet Take 1 tablet by mouth daily.    Historical Provider, MD  Fluticasone-Salmeterol (ADVAIR) 250-50 MCG/DOSE AEPB Inhale 1 puff into the lungs 2 (two) times daily.    Historical Provider, MD  furosemide (LASIX) 20 MG tablet Take 20 mg by mouth daily.    Historical Provider, MD  gabapentin (NEURONTIN) 400 MG capsule Take 400 mg by mouth 3 (three) times daily.    Historical Provider, MD  glimepiride (AMARYL) 4 MG tablet Take 4 mg by mouth daily.    Historical Provider, MD  HYDROcodone-acetaminophen (NORCO/VICODIN) 5-325 MG per tablet Take 1 tablet by mouth every 4 (four) hours as needed for moderate pain.    Historical Provider, MD  ipratropium (ATROVENT) 0.03 % nasal spray Place 2 sprays into the nose 4 (four) times daily. 05/16/14  Juanito Doom, MD  LORazepam (ATIVAN) 1 MG tablet Take 1 mg by mouth 3 (three) times daily as needed.    Historical Provider, MD  magnesium oxide (MAG-OX) 400 MG tablet Take 400 mg by mouth daily.    Historical Provider, MD  metFORMIN (GLUCOPHAGE-XR) 750 MG 24 hr tablet Take 750 mg by mouth 3 (three) times daily.    Historical Provider, MD  metoCLOPramide (REGLAN) 5 MG tablet Take 5 mg by mouth 3 (three) times daily before meals.    Historical Provider, MD  metoprolol tartrate (LOPRESSOR) 25 MG tablet Take 25 mg by mouth 2 (two) times daily.    Historical Provider, MD  OXYGEN Inhale 3-4 L into the lungs. At all times for COPD     Historical Provider, MD  pantoprazole (PROTONIX) 40 MG tablet Take 40 mg by mouth 2 (two) times daily.     Historical Provider, MD  pioglitazone (ACTOS) 30 MG tablet Take 30 mg by mouth daily.    Historical Provider, MD  potassium chloride (K-DUR) 10 MEQ tablet Take 2 tablets (20 mEq total) by mouth daily. 05/20/11 05/19/12  Juanito Doom, MD  pravastatin (PRAVACHOL) 40 MG tablet Take 40 mg by mouth daily.    Historical Provider, MD  predniSONE (DELTASONE) 10 MG tablet Take 10 mg by mouth daily with breakfast. 5 tabs orally once a day x 3 days 4 tabs orally once a day x 3 days 3 tabs orally once a day x 3 days 2 tabs orally once a day x 3 days 1 tab orally once a day x 3 days    Historical Provider, MD  Respiratory Therapy Supplies (FLUTTER) DEVI Use as directed 05/16/14   Juanito Doom, MD  sucralfate (CARAFATE) 1 G tablet Take 1 g by mouth 4 (four) times daily -  with meals and at bedtime.    Historical Provider, MD  tiotropium (SPIRIVA) 18 MCG inhalation capsule Place 1 capsule (18 mcg total) into inhaler and inhale daily. 12/05/12   Juanito Doom, MD  traMADol (ULTRAM) 50 MG tablet Take 50 mg by mouth 4 (four) times daily as needed.    Historical Provider, MD  trandolapril (MAVIK) 4 MG tablet Take 4 mg by mouth daily.    Historical Provider, MD  traZODone (DESYREL) 50 MG tablet Take 100 mg by mouth at bedtime.    Historical Provider, MD  vitamin B-12 (CYANOCOBALAMIN) 1000 MCG tablet Take 1,000 mcg by mouth daily.    Historical Provider, MD     Allergies Review of patient's allergies indicates no known allergies.  Family History  Problem Relation Age of Onset  . Factor V Leiden deficiency Sister   . Breast cancer Mother   . Colon cancer Mother     Social History Social History  Substance Use Topics  . Smoking status: Former Smoker    Packs/day: 1.50    Years: 50.00    Types: Cigarettes    Quit date: 10/05/2010  . Smokeless tobacco: Never Used  . Alcohol use No     Review of Systems  Constitutional: No fever/chills  Cardiovascular: Denies chest pain. Respiratory:As above Gastrointestinal: No abdominal pain.  Genitourinary: Negative for dysuria. Musculoskeletal: Negative for back pain. Skin: Negative for rash. Neurological: Negative for headaches or weakness  10-point ROS otherwise negative.  ____________________________________________   PHYSICAL EXAM:  VITAL SIGNS: ED Triage Vitals  Enc Vitals Group     BP 08/15/15 1254 105/62     Pulse Rate 08/15/15 1254 80  Resp 08/15/15 1254 20     Temp 08/15/15 1254 98.9 F (37.2 C)     Temp Source 08/15/15 1254 Oral     SpO2 08/15/15 1254 92 %     Weight 08/15/15 1248 250 lb (113.4 kg)     Height 08/15/15 1248 4\' 11"  (1.499 m)     Head Circumference --      Peak Flow --      Pain Score --      Pain Loc --      Pain Edu? --      Excl. in [place]? --     Constitutional: Alert and oriented. No acute distress.  Eyes: Conjunctivae are normal.  Head: Atraumatic. Nose: No congestion/rhinnorhea. Mouth/Throat: Mucous membranes are moist.   Neck:  Painless ROM Cardiovascular: Normal rate, regular rhythm. Grossly normal heart sounds.  Good peripheral circulation. Respiratory: Increased respiratory effort, mild tachypnea.  No retractions. Bibasilar rales Gastrointestinal: Soft and nontender. No distention.  No CVA tenderness. Genitourinary: deferred Musculoskeletal: 1+ lower extremity edema.  Warm and well perfused Neurologic:  Normal speech and language. No gross focal neurologic deficits are appreciated.  Skin:  Skin is warm, dry and intact. No rash noted. Psychiatric: Mood and affect are normal. Speech and behavior are normal.  ____________________________________________   LABS (all labs ordered are listed, but only abnormal results are displayed)  Labs Reviewed  CBC - Abnormal; Notable for the following:       Result Value   RBC 3.74 (*)    Hemoglobin 10.7 (*)    HCT 33.5 (*)     RDW 14.6 (*)    All other components within normal limits  COMPREHENSIVE METABOLIC PANEL - Abnormal; Notable for the following:    Sodium 128 (*)    Potassium 5.3 (*)    Chloride 88 (*)    CO2 36 (*)    Glucose, Bld 181 (*)    Calcium 7.7 (*)    Total Protein 5.8 (*)    Albumin 3.1 (*)    AST 12 (*)    ALT 10 (*)    Anion gap 4 (*)    All other components within normal limits  TROPONIN I - Abnormal; Notable for the following:    Troponin I 0.03 (*)    All other components within normal limits   ____________________________________________  EKG  ED ECG REPORT I, Lavonia Drafts, the attending physician, personally viewed and interpreted this ECG.  Date: 08/15/2015 EKG Time: 1:01 PM Rate: 84 Rhythm: AV dual paced rhythm  Intervals: normal ST/T Wave abnormalities: Nonspecific    ____________________________________________  RADIOLOGY  Chest x-ray consistent with edema ____________________________________________   PROCEDURES  Procedure(s) performed: No    Critical Care performed:No ____________________________________________   INITIAL IMPRESSION / ASSESSMENT AND PLAN / ED COURSE  Pertinent labs & imaging results that were available during my care of the patient were reviewed by me and considered in my medical decision making (see chart for details).  Patient's presentation is consistent with CHF exacerbation. We will treat with IV Lasix. I discussed with the hospitalist for admission  Clinical Course   ____________________________________________   FINAL CLINICAL IMPRESSION(S) / ED DIAGNOSES  Final diagnoses:  Acute on chronic congestive heart failure, unspecified congestive heart failure type ([place])      NEW MEDICATIONS STARTED DURING THIS VISIT:  New Prescriptions   No medications on file     Note:  This document was prepared using Dragon voice recognition software and may include unintentional dictation  errors.    Lavonia Drafts,  MD 08/15/15 1357

## 2015-08-15 NOTE — Progress Notes (Addendum)
Pt. admitted to unit, [id number] from ED, report from [facility]. Oriented to room, call bell, Ascom phones and staff. Bed in low position. Fall safety plan reviewed, pt's home non-skid socks in place, bed alarm on. Full assessment to Epic; skin assessed with Earlyne Iba RN. Foley inplace and draining properly. Telemetry box verified with CCMD and Shana R NT: [id number] . Will continue to monitor.

## 2015-08-16 ENCOUNTER — Inpatient Hospital Stay
Admit: 2015-08-16 | Discharge: 2015-08-16 | Disposition: A | Discharge status: Home / Self Care | Payer: Medicare Other | Attending: Family Medicine | Admitting: Family Medicine

## 2015-08-16 DIAGNOSIS — Z7982 Long term (current) use of aspirin: Secondary | ICD-10-CM | POA: Diagnosis not present

## 2015-08-16 DIAGNOSIS — Z7984 Long term (current) use of oral hypoglycemic drugs: Secondary | ICD-10-CM | POA: Diagnosis not present

## 2015-08-16 DIAGNOSIS — J9601 Acute respiratory failure with hypoxia: Secondary | ICD-10-CM | POA: Diagnosis present

## 2015-08-16 DIAGNOSIS — I5022 Chronic systolic (congestive) heart failure: Secondary | ICD-10-CM

## 2015-08-16 DIAGNOSIS — Z853 Personal history of malignant neoplasm of breast: Secondary | ICD-10-CM | POA: Diagnosis not present

## 2015-08-16 DIAGNOSIS — Z9981 Dependence on supplemental oxygen: Secondary | ICD-10-CM | POA: Diagnosis not present

## 2015-08-16 DIAGNOSIS — I509 Heart failure, unspecified: Secondary | ICD-10-CM | POA: Diagnosis present

## 2015-08-16 DIAGNOSIS — I110 Hypertensive heart disease with heart failure: Secondary | ICD-10-CM | POA: Diagnosis present

## 2015-08-16 DIAGNOSIS — G4730 Sleep apnea, unspecified: Secondary | ICD-10-CM | POA: Diagnosis present

## 2015-08-16 DIAGNOSIS — J9602 Acute respiratory failure with hypercapnia: Secondary | ICD-10-CM | POA: Diagnosis present

## 2015-08-16 DIAGNOSIS — J440 Chronic obstructive pulmonary disease with acute lower respiratory infection: Secondary | ICD-10-CM | POA: Diagnosis present

## 2015-08-16 DIAGNOSIS — Z950 Presence of cardiac pacemaker: Secondary | ICD-10-CM | POA: Diagnosis not present

## 2015-08-16 DIAGNOSIS — E785 Hyperlipidemia, unspecified: Secondary | ICD-10-CM | POA: Diagnosis present

## 2015-08-16 DIAGNOSIS — E119 Type 2 diabetes mellitus without complications: Secondary | ICD-10-CM | POA: Diagnosis present

## 2015-08-16 DIAGNOSIS — R339 Retention of urine, unspecified: Secondary | ICD-10-CM | POA: Diagnosis present

## 2015-08-16 DIAGNOSIS — J209 Acute bronchitis, unspecified: Secondary | ICD-10-CM | POA: Diagnosis present

## 2015-08-16 DIAGNOSIS — E871 Hypo-osmolality and hyponatremia: Secondary | ICD-10-CM | POA: Diagnosis present

## 2015-08-16 DIAGNOSIS — J441 Chronic obstructive pulmonary disease with (acute) exacerbation: Secondary | ICD-10-CM | POA: Diagnosis present

## 2015-08-16 DIAGNOSIS — I959 Hypotension, unspecified: Secondary | ICD-10-CM | POA: Diagnosis present

## 2015-08-16 DIAGNOSIS — E875 Hyperkalemia: Secondary | ICD-10-CM | POA: Diagnosis present

## 2015-08-16 DIAGNOSIS — Z800 Family history of malignant neoplasm of digestive organs: Secondary | ICD-10-CM | POA: Diagnosis not present

## 2015-08-16 DIAGNOSIS — Z79899 Other long term (current) drug therapy: Secondary | ICD-10-CM | POA: Diagnosis not present

## 2015-08-16 DIAGNOSIS — I5043 Acute on chronic combined systolic (congestive) and diastolic (congestive) heart failure: Secondary | ICD-10-CM | POA: Diagnosis present

## 2015-08-16 DIAGNOSIS — Z803 Family history of malignant neoplasm of breast: Secondary | ICD-10-CM | POA: Diagnosis not present

## 2015-08-16 LAB — ECHOCARDIOGRAM COMPLETE
Height: 59 in
Weight: 3958.4 oz

## 2015-08-16 LAB — LIPID PANEL
CHOL/HDL RATIO: 2.7 ratio
Cholesterol: 119 mg/dL (ref 0–200)
HDL: 44 mg/dL (ref >40)
LDL CALC: 58 mg/dL (ref 0–99)
Triglycerides: 87 mg/dL (ref <150)
VLDL: 17 mg/dL (ref 0–40)

## 2015-08-16 LAB — GLUCOSE, CAPILLARY
GLUCOSE-CAPILLARY: 104 mg/dL — AB (ref 65–99)
Glucose-Capillary: 125 mg/dL — ABNORMAL HIGH (ref 65–99)
Glucose-Capillary: 137 mg/dL — ABNORMAL HIGH (ref 65–99)
Glucose-Capillary: 118 mg/dL — ABNORMAL HIGH (ref 65–99)

## 2015-08-16 LAB — TROPONIN I: Troponin I: 0.04 ng/mL (ref <0.03)

## 2015-08-16 LAB — BLOOD GAS, ARTERIAL
ACID-BASE EXCESS: 16.3 mmol/L — AB (ref 0.0–3.0)
Allens test (pass/fail): POSITIVE — AB
BICARBONATE: 45.8 meq/L — AB (ref 21.0–28.0)
FIO2: 0.36
O2 SAT: 83 %
PATIENT TEMPERATURE: 37
PO2 ART: 52 mmHg — AB (ref 83.0–108.0)
pCO2 arterial: 91 mmHg (ref 32.0–48.0)
pH, Arterial: 7.31 — ABNORMAL LOW (ref 7.350–7.450)

## 2015-08-16 LAB — THYROID PANEL WITH TSH
FREE THYROXINE INDEX: 1.9 (ref 1.2–4.9)
T3 UPTAKE RATIO: 26 % (ref 24–39)
T4, Total: 7.2 ug/dL (ref 4.5–12.0)
TSH: 1.23 u[IU]/mL (ref 0.450–4.500)

## 2015-08-16 LAB — MRSA PCR SCREENING: MRSA by PCR: NEGATIVE

## 2015-08-16 LAB — BASIC METABOLIC PANEL
Anion gap: 5 (ref 5–15)
BUN: 11 mg/dL (ref 6–20)
CHLORIDE: 83 mmol/L — AB (ref 101–111)
CO2: 39 mmol/L — AB (ref 22–32)
Calcium: 7.6 mg/dL — ABNORMAL LOW (ref 8.9–10.3)
Creatinine, Ser: 0.63 mg/dL (ref 0.44–1.00)
GFR calc Af Amer: >60 mL/min (ref >60)
GLUCOSE: 105 mg/dL — AB (ref 65–99)
POTASSIUM: 4.7 mmol/L (ref 3.5–5.1)
Sodium: 127 mmol/L — ABNORMAL LOW (ref 135–145)

## 2015-08-16 MED ORDER — LEVOFLOXACIN 500 MG PO TABS
500.0000 mg | ORAL_TABLET | Freq: Every day | ORAL | Status: DC
  Administered 2015-08-16 – 2015-08-20 (×5): 500 mg via ORAL
  Filled 2015-08-16 (×6): qty 1

## 2015-08-16 MED ORDER — METHYLPREDNISOLONE SODIUM SUCC 125 MG IJ SOLR
60.0000 mg | Freq: Two times a day (BID) | INTRAMUSCULAR | Status: DC
  Administered 2015-08-16 – 2015-08-19 (×7): 60 mg via INTRAVENOUS
  Filled 2015-08-16 (×7): qty 2

## 2015-08-16 MED ORDER — FUROSEMIDE 10 MG/ML IJ SOLN
40.0000 mg | Freq: Two times a day (BID) | INTRAMUSCULAR | Status: DC
Start: 2015-08-16 — End: 2015-08-19
  Administered 2015-08-16 – 2015-08-19 (×7): 40 mg via INTRAVENOUS
  Filled 2015-08-16 (×7): qty 4

## 2015-08-16 NOTE — Clinical Social Work Note (Signed)
Clinical Social Work Assessment  Patient Details  Name: Kathryn Hayes MRN: [id number] Date of Birth: 08-07-1934  Date of referral:  08/16/15               Reason for consult:  Facility Placement                Permission sought to share information with:  Family Supports Permission granted to share information::  Yes, Release of Information Signed (HCPOA on file)  Name::     Kathryn Hayes  Agency::     Relationship::     Contact Information:  [phone]  Housing/Transportation Living arrangements for the past 2 months:  [place] of Information:  Adult Children Patient Interpreter Needed:  None Criminal Activity/Legal Involvement Pertinent to Current Situation/Hospitalization:  No - Comment as needed Significant Relationships:  Adult Children Lives with:  Self Do you feel safe going back to the place where you live?  Yes Need for family participation in patient care:  No (Coment)  Care giving concerns:  Patient may d/c to SNF. PT has not assessed.   Social Worker assessment / plan:  Patient was sleeping, and CSW contacted patient's daughter, Kathryn Hayes, in the waiting room of ICU for information.  Patient is baseline independent with feeding, needs assistance with dressing, bathing, and has had a recent sharp decline in ambulation due to ankle swelling and pain. Patient uses a rolling walker with a seat. Patient has support from [place] x2 a week 2 hours at a time for meal prep, bathing and light home cleaning.   Patient's daughter has concerns about fall risks for her mother at home, but advised the CSW that her mother would rather be home than SNF due to financial issues. Currently, the patient is in arrears with [facility] from previous SNF placements. Patient has applied for Medicaid in the past and has been denied. CSW advised Kathryn Hayes to reattempt. CSW also provided [name] with contact information for financial services.    Patient's daughter also would like to pursue ALF due to declining health of her mother. CSW advised Kathryn Hayes of payment options for ALF (medicaid being the most likely source). Patient's daughter was able to verbalize in her own terms the information and thanked the Education officer, museum for the information.   CSW advised Kathryn Hayes of PT's role in determining next steps.     Employment status:  Retired Nurse, adult PT Recommendations:  Not assessed at this time Information / Referral to community resources:     Patient/Family's Response to care:  Patient Sleeping/Patient's Daughter pleasant and attentive to her mother's needs.  Patient/Family's Understanding of and Emotional Response to Diagnosis, Current Treatment, and Prognosis:  Patient's daughter able to verbalize information about current treatment plan and potential d/c options. Patient's daughter thanked CSW for emotional support.  Emotional Assessment Appearance:  Appears stated age Attitude/Demeanor/Rapport:   (Patient sleeping, Daughter pleasant) Affect (typically observed):   (Patient sleeping, Daughter appropriate anxious affect) Orientation:   (Patient Sleeping) Alcohol / Substance use:  Never Used Psych involvement (Current and /or in the community):  No (Comment)  Discharge Needs  Concerns to be addressed:    Readmission within the last 30 days:  No Current discharge risk:  Chronically ill, Lives alone, Dependent with Mobility, Inadequate Financial Supports Barriers to Discharge:  Inadequate or no insurance   Kathryn Hayes [place], LCSW 08/16/2015, 1:25 PM

## 2015-08-16 NOTE — Progress Notes (Signed)
*  PRELIMINARY RESULTS* Echocardiogram 2D Echocardiogram has been performed.  Kathryn Hayes 08/16/2015, 9:27 AM

## 2015-08-16 NOTE — Progress Notes (Signed)
eLink Physician-Brief Progress Note Patient Name: Kathryn Hayes DOB: 02-Feb-1934 MRN: [id number]   Date of Service  08/16/2015  HPI/Events of Note  81 yo female with PMH of COPD.  ABG = 7.31/91/52 (on [place] O2). Now on BiPAP.  eICU Interventions  Will ask bedside nurse to hold further Ultram, Ativan and Trazodone doses until patient is more awake and responsive.      Intervention Category Evaluation Type: New Patient Evaluation  Lysle Dingwall 08/16/2015, 3:53 PM

## 2015-08-16 NOTE — Progress Notes (Signed)
Patient placed on BiPAP. Did remove mask once for medication administration and patient quickly stated that she want it back on as she felt better with it. Oxygenating low to mid 90's. Patient refused a break off of BiPAP and medications this afternoon. She is alert and following commands. Vitals have been stable, adequate urine output. Education material given to family regarding CHF/COPD.

## 2015-08-16 NOTE — Progress Notes (Signed)
MD notified via text on amion about blood gas results. I will continue to monitor.

## 2015-08-16 NOTE — Progress Notes (Signed)
[facility]  SUBJECTIVE: still short of breath but slightly improved. Echo showed difficult images but appears to have preserved LV function with no high-grade valvular abnormalities.   Vitals:   08/15/15 2001 08/15/15 2248 08/16/15 0456 08/16/15 0515  BP: (!) 121/49  135/90   Pulse: (!) 48 96 (!) 107   Resp: 16  16   Temp: 97.7 F (36.5 C)  98 F (36.7 C)   TempSrc: Oral  Oral   SpO2: 95%  90%   Weight:    112.2 kg (247 lb 6.4 oz)  Height:        Intake/Output Summary (Last 24 hours) at 08/16/15 1023 Last data filed at 08/15/15 1836  Gross per 24 hour  Intake              240 ml  Output                0 ml  Net              240 ml    LABS: Basic Metabolic Panel:  Recent Labs  08/15/15 1303 08/16/15 0426  NA 128* 127*  K 5.3* 4.7  CL 88* 83*  CO2 36* 39*  GLUCOSE 181* 105*  BUN 10 11  CREATININE 0.57 0.63  CALCIUM 7.7* 7.6*   Liver Function Tests:  Recent Labs  08/15/15 1303  AST 12*  ALT 10*  ALKPHOS 108  BILITOT 0.7  PROT 5.8*  ALBUMIN 3.1*   No results for input(s): LIPASE, AMYLASE in the last 72 hours. CBC:  Recent Labs  08/15/15 1303  WBC 7.3  HGB 10.7*  HCT 33.5*  MCV 89.5  PLT 180   Cardiac Enzymes:  Recent Labs  08/15/15 1607 08/15/15 2239 08/16/15 0426  TROPONINI 0.03* 0.03* 0.04*   BNP: Invalid input(s): POCBNP D-Dimer: No results for input(s): DDIMER in the last 72 hours. Hemoglobin A1C: No results for input(s): HGBA1C in the last 72 hours. Fasting Lipid Panel:  Recent Labs  08/16/15 0426  CHOL 119  HDL 44  LDLCALC 58  TRIG 87  CHOLHDL 2.7   Thyroid Function Tests:  Recent Labs  08/15/15 1607  TSH 1.230  T4TOTAL 7.2   Anemia Panel: No results for input(s): VITAMINB12, FOLATE, FERRITIN, TIBC, IRON, RETICCTPCT in the last 72 hours.   Physical Exam: Blood pressure 135/90, pulse (!) 107, temperature 98 F (36.7 C), temperature source Oral, resp. rate 16, height 4\' 11"   (1.499 m), weight 112.2 kg (247 lb 6.4 oz), SpO2 90 %.   Wt Readings from Last 1 Encounters:  08/16/15 112.2 kg (247 lb 6.4 oz)     General appearance: alert and cooperative Resp: diminished breath sounds bilaterally Cardio: Irregular rhythm GI: soft, non-tender; bowel sounds normal; no masses,  no organomegaly Extremities: edema 1+ Neurologic: Grossly normal  TELEMETRY: Reviewed telemetry pt in appears to be in sinus rhythm with intermittently atrial paced atrial and ventricular paced and AV paced rhythm.:  ASSESSMENT AND PLAN:  Active Problems:   Acute exacerbation of CHF (congestive heart failure) (HCC)-acute on chronic exacerbation of diastolic heart failure. Ejection fraction appears to be fairly well preserved. Pacemaker appears to be functioning normally. Would continue with diuresiswith IV Lasix. Continue with metoprolol. Daily weights. We'll continue to follow. She is ruled out for myocardial infarction.   CHF (congestive heart failure) ([place])    Teodoro Spray., MD, [facility] 08/16/2015 10:23 AM

## 2015-08-16 NOTE — Progress Notes (Addendum)
[place] [facility]                                                                                                                                                                                            Patient Demographics   Kathryn Hayes, is a 82 y.o. female, DOB - 04-May-1934, [id number]  Admit date - 08/15/2015   Admitting Physician Lavonia Drafts, MD  Outpatient Primary MD for the patient is [name], MD   LOS - 0  Subjective:Patient has been sleepy since admission son at the bedside she awakes to answer some questions but then goes back to sleep. Patient's son also reports that patient was having jerking type movements while she was sleeping      Review of Systems:   CONSTITUTIONAL: No documented fever. No fatigue, weakness. No weight gain, no weight loss.  EYES: No blurry or double vision.  ENT: No tinnitus. No postnasal drip. No redness of the oropharynx.  RESPIRATORY: No cough, no wheeze, no hemoptysis. Positive dyspnea.  CARDIOVASCULAR: No chest pain. No orthopnea. No palpitations. No syncope.  GASTROINTESTINAL: No nausea, no vomiting or diarrhea. No abdominal pain. No melena or hematochezia.  GENITOURINARY: No dysuria or hematuria.  ENDOCRINE: No polyuria or nocturia. No heat or cold intolerance.  HEMATOLOGY: No anemia. No bruising. No bleeding.  INTEGUMENTARY: No rashes. No lesions.  MUSCULOSKELETAL: No arthritis. No swelling. No gout.  NEUROLOGIC: No numbness, tingling, or ataxia. No seizure-type activity. Sleepy PSYCHIATRIC: No anxiety. No insomnia. No ADD.    Vitals:   Vitals:   08/15/15 2248 08/16/15 0456 08/16/15 0515 08/16/15 1151  BP:  135/90    Pulse: 96 (!) 107    Resp:  16    Temp:  98 F (36.7 C)  98.1 F (36.7 C)  TempSrc:  Oral  Oral  SpO2:  90%    Weight:   112.2 kg (247 lb 6.4 oz) 115.2 kg (253 lb 15.5 oz)  Height:    4\' 11"  (1.499 m)    Wt Readings from Last 3 Encounters:  08/16/15 115.2  kg (253 lb 15.5 oz)  05/16/14 102.5 kg (226 lb)  05/03/14 103.3 kg (227 lb 12.8 oz)     Intake/Output Summary (Last 24 hours) at 08/16/15 1158 Last data filed at 08/15/15 1836  Gross per 24 hour  Intake              240 ml  Output                0 ml  Net  240 ml    Physical Exam:   GENERAL: morbidely obese females.   HEAD, EYES, EARS, NOSE AND THROAT: Atraumatic, normocephalic. Extraocular muscles are intact. Pupils equal and reactive to light. Sclerae anicteric. No conjunctival injection. No oro-pharyngeal erythema.  NECK: Supple. There is no jugular venous distention. No bruits, no lymphadenopathy, no thyromegaly.  HEART: Regular rate and rhythm,. No murmurs, no rubs, no clicks.  LUNGS:  Decreased bs, No rales or rhonchi. No wheezes.  ABDOMEN: Soft, flat, nontender, nondistended. Has good bowel sounds. No hepatosplenomegaly appreciated.  EXTREMITIES: No evidence of any cyanosis, clubbing, or peripheral edema.  +2 pedal and radial pulses bilaterally.  NEUROLOGIC: The patient is alert, awake, and oriented x3 with no focal motor or sensory deficits appreciated bilaterally.  SKIN: Moist and warm with no rashes appreciated.  Psych: Not anxious, depressed LN: No inguinal LN enlargement    Antibiotics   Anti-infectives    Start     Dose/Rate Route Frequency Ordered Stop   08/16/15 1400  levofloxacin (LEVAQUIN) tablet 500 mg     500 mg Oral Daily 08/16/15 0846        Medications   Scheduled Meds: . amLODipine  10 mg Oral Daily  . aspirin EC  81 mg Oral Daily  . clotrimazole  10 mg Oral QID  . docusate sodium  100 mg Oral BID  . DULoxetine  60 mg Oral Daily  . enoxaparin (LOVENOX) injection  40 mg Subcutaneous Q12H  . escitalopram  20 mg Oral Daily  . Ferrous Fumarate  1 tablet Oral Daily  . furosemide  40 mg Intravenous Q12H  . gabapentin  400 mg Oral TID  . insulin aspart  0-12 Units Subcutaneous TID AC & HS  . ipratropium  2 spray Nasal QID  .  levofloxacin  500 mg Oral Daily  . magnesium oxide  400 mg Oral Daily  . methylPREDNISolone (SOLU-MEDROL) injection  60 mg Intravenous Q12H  . metoCLOPramide  5 mg Oral TID AC  . metoprolol tartrate  25 mg Oral BID  . mometasone-formoterol  2 puff Inhalation BID  . pantoprazole  40 mg Oral BID  . pravastatin  40 mg Oral Daily  . sodium chloride flush  3 mL Intravenous Q12H  . sucralfate  1 g Oral TID WC & HS  . tiotropium  18 mcg Inhalation Daily  . traZODone  100 mg Oral QHS  . vitamin B-12  1,000 mcg Oral Daily   Continuous Infusions:  PRN Meds:.sodium chloride, acetaminophen, albuterol, benzonatate, LORazepam, ondansetron (ZOFRAN) IV, sodium chloride flush, traMADol   Data Review:   Micro Results No results found for this or any previous visit (from the past 240 hour(s)).  Radiology Reports Dg Chest Portable 1 View  Result Date: 08/15/2015 CLINICAL DATA:  Shortness of breath. EXAM: PORTABLE CHEST 1 VIEW COMPARISON:  Radiographs of May 21, 2014. FINDINGS: Stable cardiomegaly. Left-sided pacemaker is unchanged in position. Atherosclerosis of thoracic aorta is noted. No pneumothorax is noted. Increased bibasilar opacities are noted concerning for edema or atelectasis with mild associated pleural effusions. Bony thorax is unremarkable. IMPRESSION: Increased bibasilar opacities are noted concerning for edema or atelectasis with mild associated pleural effusions. Aortic atherosclerosis. Electronically Signed   By: Marijo Conception, M.D.   On: 08/15/2015 13:45     CBC  Recent Labs Lab 08/15/15 1303  WBC 7.3  HGB 10.7*  HCT 33.5*  PLT 180  MCV 89.5  MCH 28.6  MCHC 32.0  RDW 14.6*  Chemistries   Recent Labs Lab 08/15/15 1303 08/16/15 0426  NA 128* 127*  K 5.3* 4.7  CL 88* 83*  CO2 36* 39*  GLUCOSE 181* 105*  BUN 10 11  CREATININE 0.57 0.63  CALCIUM 7.7* 7.6*  AST 12*  --   ALT 10*  --   ALKPHOS 108  --   BILITOT 0.7  --     ------------------------------------------------------------------------------------------------------------------ estimated creatinine clearance is 64.8 mL/min (by C-G formula based on SCr of 0.8 mg/dL). ------------------------------------------------------------------------------------------------------------------ No results for input(s): HGBA1C in the last 72 hours. ------------------------------------------------------------------------------------------------------------------  Recent Labs  08/16/15 0426  CHOL 119  HDL 44  LDLCALC 58  TRIG 87  CHOLHDL 2.7   ------------------------------------------------------------------------------------------------------------------  Recent Labs  08/15/15 1607  TSH 1.230  T4TOTAL 7.2   ------------------------------------------------------------------------------------------------------------------ No results for input(s): VITAMINB12, FOLATE, FERRITIN, TIBC, IRON, RETICCTPCT in the last 72 hours.  Coagulation profile No results for input(s): INR, PROTIME in the last 168 hours.  No results for input(s): DDIMER in the last 72 hours.  Cardiac Enzymes  Recent Labs Lab 08/15/15 1607 08/15/15 2239 08/16/15 0426  TROPONINI 0.03* 0.03* 0.04*   ------------------------------------------------------------------------------------------------------------------ Invalid input(s): POCBNP    Assessment & Plan   This is a 81 y.o. female with a history of home O2 dependent COPD, diabetes, hyperlipidemia and hypertension, second-degree heart block status post pacemaker placement now being admitted with: 1. Acute hypercarbic respiratory failure- I will transfer the stepdown,  BIPAP  Monitor abg  tomm I will place her on therapy for COPD exasperation including Solu-Medrol, nebulizer scheduled I will also place her on empiric antibiotics for acute bronchitis 2.  Acute exacerbation of congestive heart failure-patient on 20 mg of IV Lasix I  will increase her Lasix dose 3. Acute urinary retention -Foley catheter, urinalysis. At Flomax acutely 3. Hyponatremia, mild Continue to monitor with increase in Lasix likely due to fluid overload 4. Hyperkalemia, mild Potassium currently normal  5. Diabetes type 2 continue sliding scale insulin Blood glucose currently stable  6. Miscellaneous Lovenox for DVT prophylaxis       Code Status Orders        Start     Ordered   08/15/15 1556  Full code  Continuous     08/15/15 1555    Code Status History    Date Active Date Inactive Code Status Order ID Comments User Context   08/15/2015  3:55 PM 08/16/2015  9:10 AM Full Code [id number]  Harvie Bridge, DO Inpatient    Advance Directive Documentation   Flowsheet Row Most Recent Value  Type of Advance Directive  Living will  Pre-existing out of facility DNR order (yellow form or pink MOST form)  No data  "MOST" Form in Place?  No data           Consults  none  DVT Prophylaxis  Lovenox    Lab Results  Component Value Date   PLT 180 08/15/2015     Time Spent in minutes   87min Critical care time spent   Dustin Flock M.D on 08/16/2015 at 11:58 AM  Between 7am to 6pm - Pager - [phone]  After 6pm go to [email or web address] - password [email or web address] [place] [place]   Office  [phone]

## 2015-08-17 LAB — BLOOD GAS, ARTERIAL
ACID-BASE EXCESS: 19.7 mmol/L — AB (ref 0.0–3.0)
ALLENS TEST (PASS/FAIL): POSITIVE — AB
Bicarbonate: 46.6 mEq/L — ABNORMAL HIGH (ref 21.0–28.0)
FIO2: 0.36
O2 SAT: 93.9 %
PCO2 ART: 64 mmHg — AB (ref 32.0–48.0)
PH ART: 7.47 — AB (ref 7.350–7.450)
PO2 ART: 66 mmHg — AB (ref 83.0–108.0)
Patient temperature: 37

## 2015-08-17 LAB — BASIC METABOLIC PANEL
Anion gap: 8 (ref 5–15)
BUN: 15 mg/dL (ref 6–20)
CALCIUM: 7.6 mg/dL — AB (ref 8.9–10.3)
CO2: 38 mmol/L — ABNORMAL HIGH (ref 22–32)
CREATININE: 0.67 mg/dL (ref 0.44–1.00)
Chloride: 83 mmol/L — ABNORMAL LOW (ref 101–111)
GFR calc non Af Amer: >60 mL/min (ref >60)
Glucose, Bld: 128 mg/dL — ABNORMAL HIGH (ref 65–99)
Potassium: 4.5 mmol/L (ref 3.5–5.1)
SODIUM: 129 mmol/L — AB (ref 135–145)

## 2015-08-17 LAB — GLUCOSE, CAPILLARY
GLUCOSE-CAPILLARY: 144 mg/dL — AB (ref 65–99)
GLUCOSE-CAPILLARY: 183 mg/dL — AB (ref 65–99)
GLUCOSE-CAPILLARY: 228 mg/dL — AB (ref 65–99)
Glucose-Capillary: 191 mg/dL — ABNORMAL HIGH (ref 65–99)

## 2015-08-17 NOTE — Progress Notes (Signed)
Daughter, Zena Amos, called to update on transfer.  Left message for her to call for update.

## 2015-08-17 NOTE — Progress Notes (Signed)
[place] at [facility]                                                                                                                                                                                            Patient Demographics   Kathryn Hayes, is a 81 y.o. female, DOB - [birth year], [id number]  Admit date - 08/15/2015   Admitting Physician Lavonia Drafts, MD  Outpatient Primary MD for the patient is DAVID BRONSTEIN, MD   LOS - 1  SubjectivePatient continues to be on BiPAP Was able to diuresis overnight     Review of Systems:   CONSTITUTIONAL: No documented fever. No fatigue, weakness. No weight gain, no weight loss.  EYES: No blurry or double vision.  ENT: No tinnitus. No postnasal drip. No redness of the oropharynx.  RESPIRATORY: No cough, no wheeze, no hemoptysis. Positive dyspnea.  CARDIOVASCULAR: No chest pain. No orthopnea. No palpitations. No syncope.  GASTROINTESTINAL: No nausea, no vomiting or diarrhea. No abdominal pain. No melena or hematochezia.  GENITOURINARY: No dysuria or hematuria.  ENDOCRINE: No polyuria or nocturia. No heat or cold intolerance.  HEMATOLOGY: No anemia. No bruising. No bleeding.  INTEGUMENTARY: No rashes. No lesions.  MUSCULOSKELETAL: No arthritis. No swelling. No gout.  NEUROLOGIC: No numbness, tingling, or ataxia. No seizure-type activity. Sleepy PSYCHIATRIC: No anxiety. No insomnia. No ADD.    Vitals:   Vitals:   08/17/15 0200 08/17/15 0400 08/17/15 0500 08/17/15 0600  BP: (!) 141/64 132/78  (!) 129/59  Pulse: 76 78 76 76  Resp: 20 18 20 18   Temp:      TempSrc:      SpO2: 94% 93% 93% 92%  Weight:   115.4 kg (254 lb 6.6 oz)   Height:        Wt Readings from Last 3 Encounters:  08/17/15 115.4 kg (254 lb 6.6 oz)  05/16/14 102.5 kg (226 lb)  05/03/14 103.3 kg (227 lb 12.8 oz)     Intake/Output Summary (Last 24 hours) at 08/17/15 [id number] Last data filed at 08/17/15 0518  Gross per 24 hour   Intake                0 ml  Output             1950 ml  Net            -1950 ml    Physical Exam:   GENERAL: morbidely obese females.  On by HEAD, EYES, EARS, NOSE AND THROAT: Atraumatic, normocephalic. Extraocular muscles are intact. Pupils equal and reactive to light. Sclerae anicteric. No conjunctival injection. No  oro-pharyngeal erythema.  NECK: Supple. There is no jugular venous distention. No bruits, no lymphadenopathy, no thyromegaly.  HEART: Regular rate and rhythm,. No murmurs, no rubs, no clicks.  LUNGS:  Decreased bs, No rales or rhonchi. No wheezes.  ABDOMEN: Soft, flat, nontender, nondistended. Has good bowel sounds. No hepatosplenomegaly appreciated.  EXTREMITIES: No evidence of any cyanosis, clubbing, or 1+ peripheral edema.  +2 pedal and radial pulses bilaterally.  NEUROLOGIC: The patient is alert, awake, and oriented x3 with no focal motor or sensory deficits appreciated bilaterally.  SKIN: Moist and warm with no rashes appreciated.  Psych: Not anxious, depressed LN: No inguinal LN enlargement    Antibiotics   Anti-infectives    Start     Dose/Rate Route Frequency Ordered Stop   08/16/15 1400  levofloxacin (LEVAQUIN) tablet 500 mg     500 mg Oral Daily 08/16/15 0846        Medications   Scheduled Meds: . amLODipine  10 mg Oral Daily  . aspirin EC  81 mg Oral Daily  . clotrimazole  10 mg Oral QID  . docusate sodium  100 mg Oral BID  . DULoxetine  60 mg Oral Daily  . enoxaparin (LOVENOX) injection  40 mg Subcutaneous Q12H  . escitalopram  20 mg Oral Daily  . Ferrous Fumarate  1 tablet Oral Daily  . furosemide  40 mg Intravenous Q12H  . gabapentin  400 mg Oral TID  . insulin aspart  0-12 Units Subcutaneous TID AC & HS  . ipratropium  2 spray Nasal QID  . levofloxacin  500 mg Oral Daily  . magnesium oxide  400 mg Oral Daily  . methylPREDNISolone (SOLU-MEDROL) injection  60 mg Intravenous Q12H  . metoCLOPramide  5 mg Oral TID AC  . metoprolol tartrate  25  mg Oral BID  . mometasone-formoterol  2 puff Inhalation BID  . pantoprazole  40 mg Oral BID  . pravastatin  40 mg Oral Daily  . sodium chloride flush  3 mL Intravenous Q12H  . sucralfate  1 g Oral TID WC & HS  . tiotropium  18 mcg Inhalation Daily  . traZODone  100 mg Oral QHS  . vitamin B-12  1,000 mcg Oral Daily   Continuous Infusions:  PRN Meds:.sodium chloride, acetaminophen, albuterol, benzonatate, LORazepam, ondansetron (ZOFRAN) IV, sodium chloride flush, traMADol   Data Review:   Micro Results Recent Results (from the past 240 hour(s))  MRSA PCR Screening     Status: None   Collection Time: 08/16/15 11:53 AM  Result Value Ref Range Status   MRSA by PCR NEGATIVE NEGATIVE Final    Comment:        The GeneXpert MRSA Assay (FDA approved for NASAL specimens only), is one component of a comprehensive MRSA colonization surveillance program. It is not intended to diagnose MRSA infection nor to guide or monitor treatment for MRSA infections.     Radiology Reports Dg Chest Portable 1 View  Result Date: 08/15/2015 CLINICAL DATA:  Shortness of breath. EXAM: PORTABLE CHEST 1 VIEW COMPARISON:  Radiographs of May 21, 2014. FINDINGS: Stable cardiomegaly. Left-sided pacemaker is unchanged in position. Atherosclerosis of thoracic aorta is noted. No pneumothorax is noted. Increased bibasilar opacities are noted concerning for edema or atelectasis with mild associated pleural effusions. Bony thorax is unremarkable. IMPRESSION: Increased bibasilar opacities are noted concerning for edema or atelectasis with mild associated pleural effusions. Aortic atherosclerosis. Electronically Signed   By: Marijo Conception, M.D.   On: 08/15/2015 13:45  CBC  Recent Labs Lab 08/15/15 1303  WBC 7.3  HGB 10.7*  HCT 33.5*  PLT 180  MCV 89.5  MCH 28.6  MCHC 32.0  RDW 14.6*    Chemistries   Recent Labs Lab 08/15/15 1303 08/16/15 0426 08/17/15 0448  NA 128* 127* 129*  K 5.3* 4.7 4.5   CL 88* 83* 83*  CO2 36* 39* 38*  GLUCOSE 181* 105* 128*  BUN 10 11 15   CREATININE 0.57 0.63 0.67  CALCIUM 7.7* 7.6* 7.6*  AST 12*  --   --   ALT 10*  --   --   ALKPHOS 108  --   --   BILITOT 0.7  --   --    ------------------------------------------------------------------------------------------------------------------ estimated creatinine clearance is 64.9 mL/min (by C-G formula based on SCr of 0.8 mg/dL). ------------------------------------------------------------------------------------------------------------------ No results for input(s): HGBA1C in the last 72 hours. ------------------------------------------------------------------------------------------------------------------  Recent Labs  08/16/15 0426  CHOL 119  HDL 44  LDLCALC 58  TRIG 87  CHOLHDL 2.7   ------------------------------------------------------------------------------------------------------------------  Recent Labs  08/15/15 1607  TSH 1.230  T4TOTAL 7.2   ------------------------------------------------------------------------------------------------------------------ No results for input(s): VITAMINB12, FOLATE, FERRITIN, TIBC, IRON, RETICCTPCT in the last 72 hours.  Coagulation profile No results for input(s): INR, PROTIME in the last 168 hours.  No results for input(s): DDIMER in the last 72 hours.  Cardiac Enzymes  Recent Labs Lab 08/15/15 1607 08/15/15 2239 08/16/15 0426  TROPONINI 0.03* 0.03* 0.04*   ------------------------------------------------------------------------------------------------------------------ Invalid input(s): POCBNP    Assessment & Plan   This is a 81 y.o. female with a history of home O2 dependent COPD, diabetes, hyperlipidemia and hypertension, second-degree heart block status post pacemaker placement now being admitted with: 1. Acute hypercarbic respiratory failure-  Continue BiPAP Recheck ABG this morning Continued therapy for COPD exasperation  including Solu-Medrol, nebulizer empiric Levaquin for acute bronchitis 2.  Acute exacerbation of systolic congestive heart failure continue IV Lasix patient has seems to have responded to that  3. Acute urinary retention -Foley catheter, urinalysis. 3. Hyponatremia, mild Continue to monitor with slight improvement with increase in Lasix dose continued to diuresis  4. Hyperkalemia, mild Potassium currently normal  5. Diabetes type 2 continue sliding scale insulin Blood glucose currently stable  6. Miscellaneous Lovenox for DVT prophylaxis       Code Status Orders        Start     Ordered   08/15/15 1556  Full code  Continuous     08/15/15 1555    Code Status History    Date Active Date Inactive Code Status Order ID Comments User Context   08/15/2015  3:55 PM 08/16/2015  9:10 AM Full Code [id number]  Harvie Bridge, DO Inpatient    Advance Directive Documentation   Flowsheet Row Most Recent Value  Type of Advance Directive  Living will  Pre-existing out of facility DNR order (yellow form or pink MOST form)  No data  "MOST" Form in Place?  No data           Consults  none  DVT Prophylaxis  Lovenox    Lab Results  Component Value Date   PLT 180 08/15/2015     Time Spent in minutes   40 minutes critical care time spent  Dustin Flock M.D on 08/17/2015 at 8:12 AM  Between 7am to 6pm - Pager - [phone]  After 6pm go to [email or web address] - password [email or web address] [place]   Office  [phone]

## 2015-08-17 NOTE — Progress Notes (Signed)
[name]  SUBJECTIVE: Less short of breath today   Vitals:   08/17/15 0400 08/17/15 0500 08/17/15 0600 08/17/15 0900  BP: 132/78  (!) 129/59 (!) 141/87  Pulse: 78 76 76 88  Resp: 18 20 18  (!) 22  Temp:    97.8 F (36.6 C)  TempSrc:    Oral  SpO2: 93% 93% 92% 94%  Weight:  115.4 kg (254 lb 6.6 oz)    Height:        Intake/Output Summary (Last 24 hours) at 08/17/15 1020 Last data filed at 08/17/15 0518  Gross per 24 hour  Intake                0 ml  Output             1950 ml  Net            -1950 ml    LABS: Basic Metabolic Panel:  Recent Labs  08/16/15 0426 08/17/15 0448  NA 127* 129*  K 4.7 4.5  CL 83* 83*  CO2 39* 38*  GLUCOSE 105* 128*  BUN 11 15  CREATININE 0.63 0.67  CALCIUM 7.6* 7.6*   Liver Function Tests:  Recent Labs  08/15/15 1303  AST 12*  ALT 10*  ALKPHOS 108  BILITOT 0.7  PROT 5.8*  ALBUMIN 3.1*   No results for input(s): LIPASE, AMYLASE in the last 72 hours. CBC:  Recent Labs  08/15/15 1303  WBC 7.3  HGB 10.7*  HCT 33.5*  MCV 89.5  PLT 180   Cardiac Enzymes:  Recent Labs  08/15/15 1607 08/15/15 2239 08/16/15 0426  TROPONINI 0.03* 0.03* 0.04*   BNP: Invalid input(s): POCBNP D-Dimer: No results for input(s): DDIMER in the last 72 hours. Hemoglobin A1C: No results for input(s): HGBA1C in the last 72 hours. Fasting Lipid Panel:  Recent Labs  08/16/15 0426  CHOL 119  HDL 44  LDLCALC 58  TRIG 87  CHOLHDL 2.7   Thyroid Function Tests:  Recent Labs  08/15/15 1607  TSH 1.230  T4TOTAL 7.2   Anemia Panel: No results for input(s): VITAMINB12, FOLATE, FERRITIN, TIBC, IRON, RETICCTPCT in the last 72 hours.   Physical Exam: Blood pressure (!) 141/87, pulse 88, temperature 97.8 F (36.6 C), temperature source Oral, resp. rate (!) 22, height 4\' 11"  (1.499 m), weight 115.4 kg (254 lb 6.6 oz), SpO2 94 %.   Wt Readings from Last 1 Encounters:  08/17/15 115.4 kg (254 lb 6.6  oz)     General appearance: alert and cooperative Resp: rhonchi bibasilar Cardio: regular rate and rhythm GI: soft, non-tender; bowel sounds normal; no masses,  no organomegaly Extremities: extremities normal, atraumatic, no cyanosis or edema Neurologic: Grossly normal  TELEMETRY: Reviewed telemetry pt in normal sinus rhythm with PVCs:  ASSESSMENT AND PLAN:  Active Problems:   Acute exacerbation of CHF (congestive heart failure) (HCC)-patient with probable least mild cardiomyopathy with an ejection fraction difficult to assess due to poor sound wave transmission on echo. Had preserved LV function by sestamibi as an outpatient last year during a functional study. Patient became more dyspneic and hypoxic with retention of CO2 yesterday requiring transfer to the ICU. She was on BiPAP. These symptoms have resolved and she appears back to baseline. Would continue with bronchodilators, continue with amlodipine for blood pressure control. Continue with careful diuresis with furosemide IV today. We'll follow renal function consider reducing to by mouth pending outcome. Would continue with careful I's and O's as  well as daily weights.   CHF (congestive heart failure) ([place])    Teodoro Spray., MD, [facility] 08/17/2015 10:20 AM

## 2015-08-17 NOTE — Progress Notes (Signed)
Daughter returned call, updated on pt transfer to 2A rm [id number].

## 2015-08-18 LAB — BASIC METABOLIC PANEL
Anion gap: 8 (ref 5–15)
BUN: 15 mg/dL (ref 6–20)
CHLORIDE: 78 mmol/L — AB (ref 101–111)
CO2: 42 mmol/L — AB (ref 22–32)
CREATININE: 0.67 mg/dL (ref 0.44–1.00)
Calcium: 7.7 mg/dL — ABNORMAL LOW (ref 8.9–10.3)
GFR calc Af Amer: >60 mL/min (ref >60)
GFR calc non Af Amer: >60 mL/min (ref >60)
GLUCOSE: 154 mg/dL — AB (ref 65–99)
Potassium: 3.8 mmol/L (ref 3.5–5.1)
SODIUM: 128 mmol/L — AB (ref 135–145)

## 2015-08-18 LAB — CBC
HCT: 34.4 % — ABNORMAL LOW (ref 35.0–47.0)
Hemoglobin: 11.4 g/dL — ABNORMAL LOW (ref 12.0–16.0)
MCH: 28.7 pg (ref 26.0–34.0)
MCHC: 33.1 g/dL (ref 32.0–36.0)
MCV: 86.6 fL (ref 80.0–100.0)
PLATELETS: 192 10*3/uL (ref 150–440)
RBC: 3.97 MIL/uL (ref 3.80–5.20)
RDW: 14.1 % (ref 11.5–14.5)
WBC: 5.7 10*3/uL (ref 3.6–11.0)

## 2015-08-18 LAB — GLUCOSE, CAPILLARY
Glucose-Capillary: 152 mg/dL — ABNORMAL HIGH (ref 65–99)
Glucose-Capillary: 236 mg/dL — ABNORMAL HIGH (ref 65–99)
Glucose-Capillary: 229 mg/dL — ABNORMAL HIGH (ref 65–99)
Glucose-Capillary: 211 mg/dL — ABNORMAL HIGH (ref 65–99)

## 2015-08-18 MED ORDER — POLYETHYLENE GLYCOL 3350 17 G PO PACK
17.0000 g | PACK | Freq: Every day | ORAL | Status: DC
  Administered 2015-08-18 – 2015-08-24 (×7): 17 g via ORAL
  Filled 2015-08-18 (×7): qty 1

## 2015-08-18 MED ORDER — SENNOSIDES-DOCUSATE SODIUM 8.6-50 MG PO TABS
1.0000 | ORAL_TABLET | Freq: Two times a day (BID) | ORAL | Status: DC
  Administered 2015-08-18 – 2015-08-25 (×14): 1 via ORAL
  Filled 2015-08-18 (×14): qty 1

## 2015-08-18 MED ORDER — POLYETHYLENE GLYCOL 3350 17 GM/SCOOP PO POWD
1.0000 | Freq: Once | ORAL | Status: DC
  Filled 2015-08-18: qty 255

## 2015-08-18 NOTE — Evaluation (Signed)
Physical Therapy Evaluation Patient Details Name: Kathryn Hayes MRN: [id number] DOB: October 26, 1934 Today's Date: 08/18/2015   History of Present Illness  Pt is a 81 y/o admitted for acute exacerbation of CHF. Pt admitted to ED due to progressively worsening SOB and B LE edema. PMH significant for COPD, DM,, and HTN.  Clinical Impression  Pt is a pleasant 81 y/o female who presents with generalized weakness. PLOF: Pt required assistance for bathing, dressing, and preparing meals, an agency comes out during the week to assist. Pt also reports she has family nearby that come and help if needed. Pt used rollator for household ambulation (room to room), no community ambulation. Pt reports she had a fall at home recently but unsure what caused it. Pt on 4L of O2 at home (~ 3 years) and she sleeps in recliner due to difficulty with bed mobility. Pt O2 sats maintained at 90%-93% with supine ther-ex. Pt requires +2 mod-max assist with bed mobility, pursed lip breathing encouraged throughout evaluation session (on 4L O2). Pt requires min assist to maintain sitting balance, reports dizziness upon sitting, O2 at 88%, ~30 seconds later O2 decreases to 86% and dizziness persisting, pt placed back in supine position. Transfers/ambulation not attempted due to pt desaturation and dizziness. Pt will benefit from skilled PT services in order to improve strength, balance, endurance, and functional mobility. At this time pt is appropriate for STR to address deficits.      Follow Up Recommendations SNF    Equipment Recommendations  Other (comment) (Was not able to asses gait at this time.)    Recommendations for Other Services       Precautions / Restrictions Precautions Precautions: Fall Restrictions Weight Bearing Restrictions: No      Mobility  Bed Mobility Overal bed mobility: Needs Assistance Bed Mobility: Supine to Sit;Sit to Supine     Supine to sit: Mod assist;Max assist;+2 for physical  assistance Sit to supine: Mod assist;Max assist;+2 for physical assistance   General bed mobility comments: Pt requires verbal cueing to reach for railings. Pt able to scoot LE to EOB, but requiers mod-max assist to lift trunk off of bed. Pt reports SOB and has to complete movement slowly  Transfers                 General transfer comment: Not attempted due to decreased O2 sats with bed mobility and SOB.  Ambulation/Gait             General Gait Details: Not attempted  Stairs            Wheelchair Mobility    Modified Rankin (Stroke Patients Only)       Balance Overall balance assessment: Needs assistance Sitting-balance support: Bilateral upper extremity supported;Feet supported Sitting balance-Leahy Scale: Fair Sitting balance - Comments: Pt requires min assist to maintain sitting balance.                                      Pertinent Vitals/Pain Pain Assessment: No/denies pain    Home Living Family/patient expects to be discharged to:: Private residence Living Arrangements: Alone Available Help at Discharge: Family Type of Home: House Home Access: Level entry     Home Layout: One level Home Equipment: Environmental consultant - 4 wheels;Shower seat;Grab bars - tub/shower Additional Comments: Pt reports she sleeps in a recliner at home due to difficulties with bed mobility    Prior Function  Level of Independence: Needs assistance   Gait / Transfers Assistance Needed: Pt ambulated with a rollator just within household distances. Reports she has difficulty with sit/stand transfers.  ADL's / Homemaking Assistance Needed: Pt reports that someone from an agency comes in during the week to assist with dressing, bathing, and preparing meals.         Hand Dominance        Extremity/Trunk Assessment   Upper Extremity Assessment: Generalized weakness (UE grossly 4-/5)           Lower Extremity Assessment: Generalized weakness (LE grossly  3+/5)         Communication   Communication: No difficulties  Cognition Arousal/Alertness: Awake/alert Behavior During Therapy: WFL for tasks assessed/performed Overall Cognitive Status: Within Functional Limits for tasks assessed                      General Comments      Exercises Other Exercises Other Exercises: Supine ther ex: B LE SLRs, quad sets, ankle pumps, hip abd/add x 8 reps with min assist, verbal and tactile cueing.      Assessment/Plan    PT Assessment Patient needs continued PT services  PT Diagnosis Generalized weakness   PT Problem List Decreased strength;Decreased activity tolerance;Decreased balance;Decreased mobility;Decreased coordination;Decreased knowledge of use of DME  PT Treatment Interventions DME instruction;Gait training;Functional mobility training;Therapeutic activities;Therapeutic exercise;Balance training;Patient/family education   PT Goals (Current goals can be found in the Care Plan section) Acute Rehab PT Goals Patient Stated Goal: To be able to move around and return home PT Goal Formulation: With patient Time For Goal Achievement: 09/01/15 Potential to Achieve Goals: Good    Frequency Min 2X/week   Barriers to discharge Decreased caregiver support Pt lives at home alone    Co-evaluation               End of Session Equipment Utilized During Treatment: Gait belt;Oxygen Activity Tolerance: Patient limited by fatigue Patient left: in bed;with call bell/phone within reach;with bed alarm set Nurse Communication: Mobility status         Time: [id number] PT Time Calculation (min) (ACUTE ONLY): 26 min   Charges:   PT Evaluation $PT Eval Moderate Complexity: 1 Procedure PT Treatments $Therapeutic Exercise: 8-22 mins   PT G Codes:        Georgina Pillion 2015/09/09, 5:44 PM  Georgina Pillion, SPT [phone]

## 2015-08-18 NOTE — Progress Notes (Signed)
Heart Failure Clinic appointment scheduled for September 09, 2015 at 11:30am. Thank you.

## 2015-08-18 NOTE — Progress Notes (Signed)
[place] at [facility]                                                                                                                                                                                            Patient Demographics   Kathryn Hayes, is a 81 y.o. female, DOB - 1934-04-12, [id number]  Admit date - 08/15/2015   Admitting Physician Lavonia Drafts, MD  Outpatient Primary MD for the patient is [name], MD   LOS - 2  Subjective Patient feeling better little more awake denies any chest pains week      Review of Systems:   CONSTITUTIONAL: No documented fever. No fatigue, weakness. No weight gain, no weight loss.  EYES: No blurry or double vision.  ENT: No tinnitus. No postnasal drip. No redness of the oropharynx.  RESPIRATORY: No cough, no wheeze, no hemoptysis. Positive dyspnea.  CARDIOVASCULAR: No chest pain. No orthopnea. No palpitations. No syncope.  GASTROINTESTINAL: No nausea, no vomiting or diarrhea. No abdominal pain. No melena or hematochezia.  GENITOURINARY: No dysuria or hematuria.  ENDOCRINE: No polyuria or nocturia. No heat or cold intolerance.  HEMATOLOGY: No anemia. No bruising. No bleeding.  INTEGUMENTARY: No rashes. No lesions.  MUSCULOSKELETAL: No arthritis. No swelling. No gout.  NEUROLOGIC: No numbness, tingling, or ataxia. No seizure-type activity. Sleepy PSYCHIATRIC: No anxiety. No insomnia. No ADD.    Vitals:   Vitals:   08/17/15 2013 08/18/15 0342 08/18/15 0858 08/18/15 0912  BP: (!) 117/54 122/75 136/70   Pulse: 82 76 72   Resp: 18 18 (!) 28   Temp: 97.7 F (36.5 C) 98.3 F (36.8 C) 98.4 F (36.9 C)   TempSrc: Oral  Oral   SpO2: 95% 91% (!) 88% 91%  Weight: 110.6 kg (243 lb 12.8 oz) 109.9 kg (242 lb 3.2 oz)    Height:        Wt Readings from Last 3 Encounters:  08/18/15 109.9 kg (242 lb 3.2 oz)  05/16/14 102.5 kg (226 lb)  05/03/14 103.3 kg (227 lb 12.8 oz)     Intake/Output  Summary (Last 24 hours) at 08/18/15 1108 Last data filed at 08/18/15 1017  Gross per 24 hour  Intake             1080 ml  Output             4150 ml  Net            -3070 ml    Physical Exam:   GENERAL: morbidely obese females.  On by HEAD, EYES, EARS, NOSE AND THROAT: Atraumatic, normocephalic. Extraocular muscles  are intact. Pupils equal and reactive to light. Sclerae anicteric. No conjunctival injection. No oro-pharyngeal erythema.  NECK: Supple. There is no jugular venous distention. No bruits, no lymphadenopathy, no thyromegaly.  HEART: Regular rate and rhythm,. No murmurs, no rubs, no clicks.  LUNGS:  Decreased bs, No rales or rhonchi. No wheezes.  ABDOMEN: Soft, flat, nontender, nondistended. Has good bowel sounds. No hepatosplenomegaly appreciated.  EXTREMITIES: No evidence of any cyanosis, clubbing, or 1+ peripheral edema.  +2 pedal and radial pulses bilaterally.  NEUROLOGIC: The patient is alert, awake, and oriented x3 with no focal motor or sensory deficits appreciated bilaterally.  SKIN: Moist and warm with no rashes appreciated.  Psych: Not anxious, depressed LN: No inguinal LN enlargement    Antibiotics   Anti-infectives    Start     Dose/Rate Route Frequency Ordered Stop   08/16/15 1400  levofloxacin (LEVAQUIN) tablet 500 mg     500 mg Oral Daily 08/16/15 0846        Medications   Scheduled Meds: . amLODipine  10 mg Oral Daily  . aspirin EC  81 mg Oral Daily  . clotrimazole  10 mg Oral QID  . docusate sodium  100 mg Oral BID  . DULoxetine  60 mg Oral Daily  . enoxaparin (LOVENOX) injection  40 mg Subcutaneous Q12H  . escitalopram  20 mg Oral Daily  . Ferrous Fumarate  1 tablet Oral Daily  . furosemide  40 mg Intravenous Q12H  . gabapentin  400 mg Oral TID  . insulin aspart  0-12 Units Subcutaneous TID AC & HS  . ipratropium  2 spray Nasal QID  . levofloxacin  500 mg Oral Daily  . magnesium oxide  400 mg Oral Daily  . methylPREDNISolone (SOLU-MEDROL)  injection  60 mg Intravenous Q12H  . metoCLOPramide  5 mg Oral TID AC  . metoprolol tartrate  25 mg Oral BID  . mometasone-formoterol  2 puff Inhalation BID  . pantoprazole  40 mg Oral BID  . pravastatin  40 mg Oral Daily  . sodium chloride flush  3 mL Intravenous Q12H  . sucralfate  1 g Oral TID WC & HS  . tiotropium  18 mcg Inhalation Daily  . traZODone  100 mg Oral QHS  . vitamin B-12  1,000 mcg Oral Daily   Continuous Infusions:  PRN Meds:.sodium chloride, acetaminophen, albuterol, benzonatate, LORazepam, ondansetron (ZOFRAN) IV, sodium chloride flush, traMADol   Data Review:   Micro Results Recent Results (from the past 240 hour(s))  MRSA PCR Screening     Status: None   Collection Time: 08/16/15 11:53 AM  Result Value Ref Range Status   MRSA by PCR NEGATIVE NEGATIVE Final    Comment:        The GeneXpert MRSA Assay (FDA approved for NASAL specimens only), is one component of a comprehensive MRSA colonization surveillance program. It is not intended to diagnose MRSA infection nor to guide or monitor treatment for MRSA infections.     Radiology Reports Dg Chest Portable 1 View  Result Date: 08/15/2015 CLINICAL DATA:  Shortness of breath. EXAM: PORTABLE CHEST 1 VIEW COMPARISON:  Radiographs of May 21, 2014. FINDINGS: Stable cardiomegaly. Left-sided pacemaker is unchanged in position. Atherosclerosis of thoracic aorta is noted. No pneumothorax is noted. Increased bibasilar opacities are noted concerning for edema or atelectasis with mild associated pleural effusions. Bony thorax is unremarkable. IMPRESSION: Increased bibasilar opacities are noted concerning for edema or atelectasis with mild associated pleural effusions. Aortic atherosclerosis. Electronically Signed  By: Marijo Conception, M.D.   On: 08/15/2015 13:45     CBC  Recent Labs Lab 08/15/15 1303 08/18/15 0441  WBC 7.3 5.7  HGB 10.7* 11.4*  HCT 33.5* 34.4*  PLT 180 192  MCV 89.5 86.6  MCH 28.6 28.7   MCHC 32.0 33.1  RDW 14.6* 14.1    Chemistries   Recent Labs Lab 08/15/15 1303 08/16/15 0426 08/17/15 0448 08/18/15 0441  NA 128* 127* 129* 128*  K 5.3* 4.7 4.5 3.8  CL 88* 83* 83* 78*  CO2 36* 39* 38* 42*  GLUCOSE 181* 105* 128* 154*  BUN 10 11 15 15   CREATININE 0.57 0.63 0.67 0.67  CALCIUM 7.7* 7.6* 7.6* 7.7*  AST 12*  --   --   --   ALT 10*  --   --   --   ALKPHOS 108  --   --   --   BILITOT 0.7  --   --   --    ------------------------------------------------------------------------------------------------------------------ estimated creatinine clearance is 62.9 mL/min (by C-G formula based on SCr of 0.8 mg/dL). ------------------------------------------------------------------------------------------------------------------ No results for input(s): HGBA1C in the last 72 hours. ------------------------------------------------------------------------------------------------------------------  Recent Labs  08/16/15 0426  CHOL 119  HDL 44  LDLCALC 58  TRIG 87  CHOLHDL 2.7   ------------------------------------------------------------------------------------------------------------------  Recent Labs  08/15/15 1607  TSH 1.230  T4TOTAL 7.2   ------------------------------------------------------------------------------------------------------------------ No results for input(s): VITAMINB12, FOLATE, FERRITIN, TIBC, IRON, RETICCTPCT in the last 72 hours.  Coagulation profile No results for input(s): INR, PROTIME in the last 168 hours.  No results for input(s): DDIMER in the last 72 hours.  Cardiac Enzymes  Recent Labs Lab 08/15/15 1607 08/15/15 2239 08/16/15 0426  TROPONINI 0.03* 0.03* 0.04*   ------------------------------------------------------------------------------------------------------------------ Invalid input(s): POCBNP    Assessment & Plan   This is a 81 y.o. female with a history of home O2 dependent COPD, diabetes, hyperlipidemia and  hypertension, second-degree heart block status post pacemaker placement now being admitted with: 1. Acute hypercarbic respiratory failure-  Continue BiPAPAt nighttime Continued therapy for COPD exasperation including Solu-Medrol, nebulizer empiric Levaquin for acute bronchitis 2.  Acute exacerbation of systolic congestive heart failure continue IV Lasix has put out up to more than 5 L since admission 3. Acute urinary retention -Foley catheter, voiding trial 3. Hyponatremia, mild Stable likely chronic  4. Hyperkalemia, mild Potassium currently normal  5. Diabetes type 2 continue sliding scale insulin Blood glucose fluctuating continue to monitor  6. Miscellaneous Lovenox for DVT prophylaxis       Code Status Orders        Start     Ordered   08/15/15 1556  Full code  Continuous     08/15/15 1555    Code Status History    Date Active Date Inactive Code Status Order ID Comments User Context   08/15/2015  3:55 PM 08/16/2015  9:10 AM Full Code [id number]  Harvie Bridge, DO Inpatient    Advance Directive Documentation   Flowsheet Row Most Recent Value  Type of Advance Directive  Living will  Pre-existing out of facility DNR order (yellow form or pink MOST form)  No data  "MOST" Form in Place?  No data           Consults  none  DVT Prophylaxis  Lovenox    Lab Results  Component Value Date   PLT 192 08/18/2015     Time Spent in minutes   35min  Betania Dizon M.D on 08/18/2015 at 11:08  AM  Between 7am to 6pm - Pager - [phone]  After 6pm go to [email or web address] - password [email or web address] [facility]   Office  [phone]

## 2015-08-18 NOTE — Care Management (Addendum)
Patient presented form home.  On chronic home 02 through [place]. has a walker at home and usus ally "gets around pretty good."  Lives alone.   Placed in observation then met admission criteria due to declining respiratory status.  She was transferred to stepdown due to the need for continuous bipap.  she was transferred to 2A 8/13. Patient says she has not been out of bed since admission.  States that if skilled nursing placement is recommended, she is open to consider.  Obtained order for physical therapy consult

## 2015-08-18 NOTE — Care Management Important Message (Signed)
Important Message  Patient Details  Name: Kathryn Hayes MRN: [id number] Date of Birth: [birth year]   Medicare Important Message Given:  Yes    Katrina Stack, RN 08/18/2015, 9:59 AM

## 2015-08-19 LAB — GLUCOSE, CAPILLARY
GLUCOSE-CAPILLARY: 151 mg/dL — AB (ref 65–99)
Glucose-Capillary: 164 mg/dL — ABNORMAL HIGH (ref 65–99)
Glucose-Capillary: 256 mg/dL — ABNORMAL HIGH (ref 65–99)
Glucose-Capillary: 143 mg/dL — ABNORMAL HIGH (ref 65–99)

## 2015-08-19 LAB — BASIC METABOLIC PANEL
Anion gap: 5 (ref 5–15)
BUN: 16 mg/dL (ref 6–20)
CHLORIDE: 78 mmol/L — AB (ref 101–111)
CO2: 45 mmol/L — AB (ref 22–32)
CREATININE: 0.63 mg/dL (ref 0.44–1.00)
Calcium: 7.4 mg/dL — ABNORMAL LOW (ref 8.9–10.3)
GFR calc Af Amer: >60 mL/min (ref >60)
GFR calc non Af Amer: >60 mL/min (ref >60)
GLUCOSE: 171 mg/dL — AB (ref 65–99)
Potassium: 4.5 mmol/L (ref 3.5–5.1)
Sodium: 128 mmol/L — ABNORMAL LOW (ref 135–145)

## 2015-08-19 MED ORDER — IBUPROFEN 400 MG PO TABS
600.0000 mg | ORAL_TABLET | Freq: Three times a day (TID) | ORAL | Status: DC | PRN
  Administered 2015-08-19 – 2015-08-20 (×2): 600 mg via ORAL
  Filled 2015-08-19 (×2): qty 2

## 2015-08-19 MED ORDER — METOPROLOL TARTRATE 25 MG PO TABS
12.5000 mg | ORAL_TABLET | Freq: Two times a day (BID) | ORAL | Status: DC
  Administered 2015-08-19 – 2015-08-25 (×12): 12.5 mg via ORAL
  Filled 2015-08-19 (×12): qty 1

## 2015-08-19 MED ORDER — INSULIN GLARGINE 100 UNIT/ML ~~LOC~~ SOLN
6.0000 [IU] | Freq: Every day | SUBCUTANEOUS | Status: DC
  Administered 2015-08-19 – 2015-08-22 (×4): 6 [IU] via SUBCUTANEOUS
  Filled 2015-08-19 (×4): qty 0.06

## 2015-08-19 MED ORDER — TAMSULOSIN HCL 0.4 MG PO CAPS
0.4000 mg | ORAL_CAPSULE | Freq: Every day | ORAL | Status: DC
  Administered 2015-08-19 – 2015-08-25 (×7): 0.4 mg via ORAL
  Filled 2015-08-19 (×7): qty 1

## 2015-08-19 MED ORDER — PHENOL 1.4 % MT LIQD
1.0000 | OROMUCOSAL | Status: DC | PRN
Start: 2015-08-19 — End: 2015-08-25
  Administered 2015-08-19: 1 via OROMUCOSAL
  Filled 2015-08-19: qty 177

## 2015-08-19 NOTE — Clinical Social Work Note (Signed)
MSW met with patient and presented bed offers.  Patient chose to go to [facility], MSW contacted [facility] and they said they can accept patient once she is medically ready for discharge and orders have been received.  MSW to continue to follow patient's progress throughout discharge plan.  Jones Broom. Silvino Selman, MSW [phone]  Mon-Fri 8a-4:30p 08/19/2015 4:07 PM

## 2015-08-19 NOTE — NC FL2 (Signed)
[place] LEVEL OF CARE SCREENING TOOL     IDENTIFICATION  Patient Name: Kathryn Hayes Birthdate: Feb 25, 1934 Sex: female Admission Date (Current Location): 08/15/2015  [place] and [place] Number:  Engineering geologist and Address:  [facility], [address], [place], [place] [address]      Provider Number: [id number]  Attending Physician Name and Address:  Dustin Flock, MD  Relative Name and Phone Number:  Zena Amos Daughter [phone]    Current Level of Care: Hospital Recommended Level of Care: [place] Prior Approval Number:    Date Approved/Denied:   PASRR Number:  [id number]   Discharge Plan: SNF    Current Diagnoses: Patient Active Problem List   Diagnosis Date Noted  . CHF (congestive heart failure) ([place]) 08/16/2015  . Acute exacerbation of CHF (congestive heart failure) ([place]) 08/15/2015  . Pleural effusion 05/16/2014  . 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 Barrett's esophagus 01/05/2012  . Congestive heart failure ([place]) 05/20/2011  . Hypoxemic respiratory failure, chronic ([place]) 03/10/2011  . COPD (chronic obstructive pulmonary disease) ([place])   . Hypertension   . Hyperlipidemia   . Diabetes mellitus     Orientation RESPIRATION BLADDER Height & Weight     Self, Time, Situation, Place  O2 (4 L) Continent Weight: 237 lb 9.6 oz (107.8 kg) Height:  4\' 11"  (149.9 cm)  BEHAVIORAL SYMPTOMS/MOOD NEUROLOGICAL BOWEL NUTRITION STATUS      Continent Diet (Regular diet)  AMBULATORY STATUS COMMUNICATION OF NEEDS Skin   Limited Assist Verbally Normal                       Personal Care Assistance Level of Assistance  Bathing, Feeding, Dressing Bathing Assistance: Limited assistance Feeding assistance: Limited assistance Dressing Assistance: Limited assistance     Functional Limitations Info  Sight, Hearing, Speech Sight Info: Adequate Hearing Info: Adequate Speech Info: Adequate     SPECIAL CARE FACTORS FREQUENCY  PT (By licensed PT)     PT Frequency: 5x a week              Contractures Contractures Info: Not present    Additional Factors Info  Code Status, Allergies, Insulin Sliding Scale Code Status Info: Full Code Allergies Info: NKA   Insulin Sliding Scale Info: 3x a day       Current Medications (08/19/2015):  This is the current hospital active medication list Current Facility-Administered Medications  Medication Dose Route Frequency Provider Last Rate Last Dose  . 0.9 %  sodium chloride infusion  250 mL Intravenous PRN Alexis Hugelmeyer, DO      . acetaminophen (TYLENOL) tablet 650 mg  650 mg Oral Q4H PRN Alexis Hugelmeyer, DO      . albuterol (PROVENTIL) (2.5 MG/3ML) 0.083% nebulizer solution 2.5 mg  2.5 mg Nebulization Q4H PRN Alexis Hugelmeyer, DO      . aspirin EC tablet 81 mg  81 mg Oral Daily Alexis Hugelmeyer, DO   81 mg at 08/18/15 1019  . benzonatate (TESSALON) capsule 100 mg  100 mg Oral TID PRN Alexis Hugelmeyer, DO      . clotrimazole [facility]) troche 10 mg  10 mg Oral QID Alexis Hugelmeyer, DO   10 mg at 08/18/15 2208  . DULoxetine (CYMBALTA) DR capsule 60 mg  60 mg Oral Daily Alexis Hugelmeyer, DO   60 mg at 08/18/15 1020  . enoxaparin (LOVENOX) injection 40 mg  40 mg Subcutaneous Q12H Alexis Hugelmeyer, DO   40 mg at  08/18/15 2214  . escitalopram (LEXAPRO) tablet 20 mg  20 mg Oral Daily Alexis Hugelmeyer, DO   20 mg at 08/18/15 1020  . Ferrous Fumarate (HEMOCYTE - 106 mg FE) tablet 106 mg of iron  1 tablet Oral Daily Alexis Hugelmeyer, DO   106 mg of iron at 08/18/15 1020  . gabapentin (NEURONTIN) capsule 400 mg  400 mg Oral TID Alexis Hugelmeyer, DO   400 mg at 08/18/15 2206  . insulin aspart (novoLOG) injection 0-12 Units  0-12 Units Subcutaneous TID AC & HS Alexis Hugelmeyer, DO   2 Units at 08/19/15 0758  . insulin glargine (LANTUS) injection 6 Units  6 Units Subcutaneous Daily Dustin Flock, MD      . ipratropium (ATROVENT)  0.03 % nasal spray 2 spray  2 spray Nasal QID Alexis Hugelmeyer, DO   2 spray at 08/18/15 2234  . levofloxacin (LEVAQUIN) tablet 500 mg  500 mg Oral Daily Dustin Flock, MD   500 mg at 08/19/15 0524  . LORazepam (ATIVAN) tablet 1 mg  1 mg Oral TID PRN Alexis Hugelmeyer, DO   1 mg at 08/15/15 2249  . magnesium oxide (MAG-OX) tablet 400 mg  400 mg Oral Daily Alexis Hugelmeyer, DO   400 mg at 08/18/15 1021  . metoCLOPramide (REGLAN) tablet 5 mg  5 mg Oral TID AC Alexis Hugelmeyer, DO   5 mg at 08/19/15 0800  . metoprolol tartrate (LOPRESSOR) tablet 12.5 mg  12.5 mg Oral BID Dustin Flock, MD      . mometasone-formoterol [facility]) 200-5 MCG/ACT inhaler 2 puff  2 puff Inhalation BID Alexis Hugelmeyer, DO   2 puff at 08/18/15 2209  . ondansetron (ZOFRAN) injection 4 mg  4 mg Intravenous Q6H PRN Alexis Hugelmeyer, DO      . pantoprazole (PROTONIX) EC tablet 40 mg  40 mg Oral BID Alexis Hugelmeyer, DO   40 mg at 08/18/15 2206  . polyethylene glycol (MIRALAX / GLYCOLAX) packet 17 g  17 g Oral Daily Dustin Flock, MD   17 g at 08/18/15 1900  . pravastatin (PRAVACHOL) tablet 40 mg  40 mg Oral Daily Alexis Hugelmeyer, DO   40 mg at 08/18/15 1020  . senna-docusate (Senokot-S) tablet 1 tablet  1 tablet Oral BID Dustin Flock, MD   1 tablet at 08/18/15 2206  . sodium chloride flush (NS) 0.9 % injection 3 mL  3 mL Intravenous Q12H Alexis Hugelmeyer, DO   3 mL at 08/18/15 2231  . sodium chloride flush (NS) 0.9 % injection 3 mL  3 mL Intravenous PRN Alexis Hugelmeyer, DO      . sucralfate (CARAFATE) tablet 1 g  1 g Oral TID WC & HS Alexis Hugelmeyer, DO   1 g at 08/19/15 0800  . tamsulosin (FLOMAX) capsule 0.4 mg  0.4 mg Oral Daily Dustin Flock, MD      . tiotropium [facility]) inhalation capsule 18 mcg  18 mcg Inhalation Daily Alexis Hugelmeyer, DO   18 mcg at 08/18/15 1027  . traMADol (ULTRAM) tablet 50 mg  50 mg Oral QID PRN Alexis Hugelmeyer, DO   50 mg at 08/18/15 1421  . traZODone (DESYREL) tablet 100 mg   100 mg Oral QHS Alexis Hugelmeyer, DO   100 mg at 08/18/15 2206  . vitamin B-12 (CYANOCOBALAMIN) tablet 1,000 mcg  1,000 mcg Oral Daily Alexis Hugelmeyer, DO   1,000 mcg at 08/18/15 1020     Discharge Medications: Please see discharge summary for a list of discharge medications.  Relevant Imaging  Results:  Relevant Lab Results:   Additional Information SSN [id number]  Ross Ludwig

## 2015-08-19 NOTE — Clinical Social Work Placement (Signed)
   CLINICAL SOCIAL WORK PLACEMENT  NOTE  Date:  08/19/2015  Patient Details  Name: Kathryn Hayes MRN: [id number] Date of Birth: [birth year]  Clinical Social Work is seeking post-discharge placement for this patient at the [place] level of care (*CSW will initial, date and re-position this form in  chart as items are completed):  Yes   Patient/family provided with [facility] Work Department's list of facilities offering this level of care within the geographic area requested by the patient (or if unable, by the patient's family).  Yes   Patient/family informed of their freedom to choose among providers that offer the needed level of care, that participate in Medicare, Medicaid or managed care program needed by the patient, have an available bed and are willing to accept the patient.  Yes   Patient/family informed of [facility]'s ownership interest in [facility] and [facility], as well as of the fact that they are under no obligation to receive care at these facilities.  PASRR submitted to EDS on 08/19/15     PASRR number received on       Existing PASRR number confirmed on 08/19/15     FL2 transmitted to all facilities in geographic area requested by pt/family on 08/19/15     FL2 transmitted to all facilities within larger geographic area on       Patient informed that his/her managed care company has contracts with or will negotiate with certain facilities, including the following:        Yes   Patient/family informed of bed offers received.  Patient chooses bed at [facility]     Physician recommends and patient chooses bed at      Patient to be transferred to   on  .  Patient to be transferred to facility by       Patient family notified on   of transfer.  Name of family member notified:        PHYSICIAN Please sign FL2     Additional Comment:    _______________________________________________ Ross Ludwig 08/19/2015, 4:05 PM

## 2015-08-19 NOTE — Progress Notes (Signed)
Physical Therapy Treatment Patient Details Name: Kathryn Hayes MRN: [id number] DOB: 06-05-1934 Today's Date: 08/19/2015    History of Present Illness Pt is a 81 y/o admitted for acute exacerbation of CHF. Pt admitted to ED due to progressively worsening SOB and B LE edema. PMH significant for COPD, DM,, and HTN.    PT Comments    Pt reports she is feeling much better today, still on 4L O2. O2 sats WFL with supine ther-ex. Pt requires +2 mod assist for bed mobility, transfers, and for ambulation. Orthostatics positive: Supine-109/43 Seated: 85/66 Standing: 108/64 (RN notified). Pt reports dizziness and slight nausea with all change in position that subsides within ~60 seconds. Verbal cueing for proper hand placement on RW and to decrease posterior lean with transfers and ambulation. Slightly SOB post ambulation, O2 88% and increases to 92% within 30 seconds. Pt is progressing towards her goals and will continue to benefit from skilled PT services in order to improve strength, ROM, balance, endurance, functional mobility, and safety with DME use. At this time pt is appropriate for STR to address deficits.   Follow Up Recommendations  SNF     Equipment Recommendations  Rolling walker with 5" wheels    Recommendations for Other Services       Precautions / Restrictions Precautions Precautions: Fall Restrictions Weight Bearing Restrictions: No    Mobility  Bed Mobility Overal bed mobility: Needs Assistance Bed Mobility: Supine to Sit     Supine to sit: Mod assist;+2 for physical assistance     General bed mobility comments: Pt requires verbal cueing to reach for railings. Pt able to scoot LE to EOB, but requiers mod-max assist to lift trunk off of bed. Decreased SOB today O2 at 91% upon sitting but pt reports she feels dizzy. BP 85/66  Transfers Overall transfer level: Needs assistance Equipment used: Rolling walker (2 wheeled) Transfers: Sit to/from Stand Sit to Stand: Mod  assist;+2 physical assistance         General transfer comment: Verbal cueing for proper hand placement on RW, report B LE pain of 7/10 with standing. Dizziness upon standing (BP 108/64), subsides within 60 seconds.   Ambulation/Gait Ambulation/Gait assistance: Mod assist;+2 physical assistance Ambulation Distance (Feet): 3 Feet Assistive device: Rolling walker (2 wheeled) Gait Pattern/deviations: Step-to pattern;Decreased step length - right;Decreased step length - left;Decreased stride length;Leaning posteriorly;Trunk flexed   Gait velocity interpretation: Below normal speed for age/gender General Gait Details: Pt demonstrates step-to gait gait, takes very short steps with +2 mod assist. Verbal cueing for sequencing of movement. Very slow gait speed.   Stairs            Wheelchair Mobility    Modified Rankin (Stroke Patients Only)       Balance Overall balance assessment: Needs assistance Sitting-balance support: Single extremity supported;Feet supported Sitting balance-Leahy Scale: Good Sitting balance - Comments: Pt able to maintain sitting balance with single UE support on railing, however started to demonstrate posterior lean once she was fatigued.    Standing balance support: Bilateral upper extremity supported Standing balance-Leahy Scale: Fair Standing balance comment: Pt requires +2 mod assist and UE support on RW to maintain standing balance. Verbal cues to decrease posterior lean.                    Cognition Arousal/Alertness: Awake/alert Behavior During Therapy: WFL for tasks assessed/performed Overall Cognitive Status: Within Functional Limits for tasks assessed  Exercises Other Exercises Other Exercises: Supine ther ex: B LE heel slides (with min assist), SLRs, quad sets, SAQs, hip abd/add x 10 reps with supervision, verbal and tactile cueing. O2 between 91%-94% with ther-ex.    General Comments         Pertinent Vitals/Pain Pain Assessment: 0-10 Pain Score: 7  (with standing) Pain Location: B LE Pain Intervention(s): Limited activity within patient's tolerance;Monitored during session    Home Living                      Prior Function            PT Goals (current goals can now be found in the care plan section) Acute Rehab PT Goals Patient Stated Goal: To be able to move around and return home PT Goal Formulation: With patient Time For Goal Achievement: 09/01/15 Potential to Achieve Goals: Good Progress towards PT goals: Progressing toward goals    Frequency  Min 2X/week    PT Plan Current plan remains appropriate    Co-evaluation             End of Session Equipment Utilized During Treatment: Gait belt;Oxygen Activity Tolerance: Patient limited by fatigue Patient left: in chair;with call bell/phone within reach;with chair alarm set     Time: [id number] PT Time Calculation (min) (ACUTE ONLY): 32 min  Charges:                       G Codes:      Georgina Pillion 08/22/2015, 12:40 PM  Georgina Pillion, SPT [phone]

## 2015-08-19 NOTE — Care Management (Signed)
PT recommending SNF. Care team reports positive orthostatics. Shortness of breath and IV lasix.

## 2015-08-19 NOTE — Progress Notes (Signed)
[place] at [facility]                                                                                                                                                                                            Patient Demographics   Kathryn Hayes, is a 81 y.o. female, DOB - 23-Aug-1934, [id number]  Admit date - 08/15/2015   Admitting Physician Lavonia Drafts, MD  Outpatient Primary MD for the patient is [name], MD   LOS - 3  Subjective Patient reports that she got dizzy when she tried to get up Her breathing seems to have improved. She denies any chest pains Her daughter reports the patient supposed to be on CPAP at nighttime however she is not wearing that at home      Review of Systems:   CONSTITUTIONAL: No documented fever. No fatigue, weakness. No weight gain, no weight loss.  EYES: No blurry or double vision.  ENT: No tinnitus. No postnasal drip. No redness of the oropharynx.  RESPIRATORY: No cough, no wheeze, no hemoptysis. Positive dyspnea. Improved CARDIOVASCULAR: No chest pain. No orthopnea. No palpitations. No syncope. Dizziness with standing  GASTROINTESTINAL: No nausea, no vomiting or diarrhea. No abdominal pain. No melena or hematochezia.  GENITOURINARY: No dysuria or hematuria.  ENDOCRINE: No polyuria or nocturia. No heat or cold intolerance.  HEMATOLOGY: No anemia. No bruising. No bleeding.  INTEGUMENTARY: No rashes. No lesions.  MUSCULOSKELETAL: No arthritis. No swelling. No gout.  NEUROLOGIC: No numbness, tingling, or ataxia. No seizure-type activity. Sleepy PSYCHIATRIC: No anxiety. No insomnia. No ADD.    Vitals:   Vitals:   08/19/15 0635 08/19/15 0755 08/19/15 0756 08/19/15 1102  BP: (!) 119/56 (!) 97/48 (!) 116/94 (!) 104/45  Pulse: (!) 27 83 84 74  Resp: 20 20  20   Temp: 98 F (36.7 C) 98.2 F (36.8 C)  98.6 F (37 C)  TempSrc: Oral Oral  Oral  SpO2: 97% 96%  96%  Weight: 107.8 kg (237 lb 9.6 oz)      Height:        Wt Readings from Last 3 Encounters:  08/19/15 107.8 kg (237 lb 9.6 oz)  05/16/14 102.5 kg (226 lb)  05/03/14 103.3 kg (227 lb 12.8 oz)     Intake/Output Summary (Last 24 hours) at 08/19/15 1340 Last data filed at 08/19/15 1322  Gross per 24 hour  Intake             2290 ml  Output             3002 ml  Net             -  712 ml    Physical Exam:   GENERAL: morbidely obese females.  On by HEAD, EYES, EARS, NOSE AND THROAT: Atraumatic, normocephalic. Extraocular muscles are intact. Pupils equal and reactive to light. Sclerae anicteric. No conjunctival injection. No oro-pharyngeal erythema.  NECK: Supple. There is no jugular venous distention. No bruits, no lymphadenopathy, no thyromegaly.  HEART: Regular rate and rhythm,. No murmurs, no rubs, no clicks.  LUNGS:  Decreased bs, No rales or rhonchi. No wheezes.  ABDOMEN: Soft, flat, nontender, nondistended. Has good bowel sounds. No hepatosplenomegaly appreciated.  EXTREMITIES: No evidence of any cyanosis, clubbing, or 1+ peripheral edema.  +2 pedal and radial pulses bilaterally.  NEUROLOGIC: The patient is alert, awake, and oriented x3 with no focal motor or sensory deficits appreciated bilaterally.  SKIN: Moist and warm with no rashes appreciated.  Psych: Not anxious, depressed LN: No inguinal LN enlargement    Antibiotics   Anti-infectives    Start     Dose/Rate Route Frequency Ordered Stop   08/16/15 1400  levofloxacin (LEVAQUIN) tablet 500 mg     500 mg Oral Daily 08/16/15 0846        Medications   Scheduled Meds: . aspirin EC  81 mg Oral Daily  . clotrimazole  10 mg Oral QID  . DULoxetine  60 mg Oral Daily  . enoxaparin (LOVENOX) injection  40 mg Subcutaneous Q12H  . escitalopram  20 mg Oral Daily  . Ferrous Fumarate  1 tablet Oral Daily  . gabapentin  400 mg Oral TID  . insulin aspart  0-12 Units Subcutaneous TID AC & HS  . insulin glargine  6 Units Subcutaneous Daily  . ipratropium  2 spray Nasal  QID  . levofloxacin  500 mg Oral Daily  . magnesium oxide  400 mg Oral Daily  . metoCLOPramide  5 mg Oral TID AC  . metoprolol tartrate  12.5 mg Oral BID  . mometasone-formoterol  2 puff Inhalation BID  . pantoprazole  40 mg Oral BID  . polyethylene glycol  17 g Oral Daily  . pravastatin  40 mg Oral Daily  . senna-docusate  1 tablet Oral BID  . sodium chloride flush  3 mL Intravenous Q12H  . sucralfate  1 g Oral TID WC & HS  . tamsulosin  0.4 mg Oral Daily  . tiotropium  18 mcg Inhalation Daily  . traZODone  100 mg Oral QHS  . vitamin B-12  1,000 mcg Oral Daily   Continuous Infusions:  PRN Meds:.sodium chloride, acetaminophen, albuterol, benzonatate, LORazepam, ondansetron (ZOFRAN) IV, sodium chloride flush, traMADol   Data Review:   Micro Results Recent Results (from the past 240 hour(s))  MRSA PCR Screening     Status: None   Collection Time: 08/16/15 11:53 AM  Result Value Ref Range Status   MRSA by PCR NEGATIVE NEGATIVE Final    Comment:        The GeneXpert MRSA Assay (FDA approved for NASAL specimens only), is one component of a comprehensive MRSA colonization surveillance program. It is not intended to diagnose MRSA infection nor to guide or monitor treatment for MRSA infections.     Radiology Reports Dg Chest Portable 1 View  Result Date: 08/15/2015 CLINICAL DATA:  Shortness of breath. EXAM: PORTABLE CHEST 1 VIEW COMPARISON:  Radiographs of May 21, 2014. FINDINGS: Stable cardiomegaly. Left-sided pacemaker is unchanged in position. Atherosclerosis of thoracic aorta is noted. No pneumothorax is noted. Increased bibasilar opacities are noted concerning for edema or atelectasis with mild associated pleural  effusions. Bony thorax is unremarkable. IMPRESSION: Increased bibasilar opacities are noted concerning for edema or atelectasis with mild associated pleural effusions. Aortic atherosclerosis. Electronically Signed   By: Marijo Conception, M.D.   On: 08/15/2015 13:45      CBC  Recent Labs Lab 08/15/15 1303 08/18/15 0441  WBC 7.3 5.7  HGB 10.7* 11.4*  HCT 33.5* 34.4*  PLT 180 192  MCV 89.5 86.6  MCH 28.6 28.7  MCHC 32.0 33.1  RDW 14.6* 14.1    Chemistries   Recent Labs Lab 08/15/15 1303 08/16/15 0426 08/17/15 0448 08/18/15 0441 08/19/15 0349  NA 128* 127* 129* 128* 128*  K 5.3* 4.7 4.5 3.8 4.5  CL 88* 83* 83* 78* 78*  CO2 36* 39* 38* 42* 45*  GLUCOSE 181* 105* 128* 154* 171*  BUN 10 11 15 15 16   CREATININE 0.57 0.63 0.67 0.67 0.63  CALCIUM 7.7* 7.6* 7.6* 7.7* 7.4*  AST 12*  --   --   --   --   ALT 10*  --   --   --   --   ALKPHOS 108  --   --   --   --   BILITOT 0.7  --   --   --   --    ------------------------------------------------------------------------------------------------------------------ estimated creatinine clearance is 62.1 mL/min (by C-G formula based on SCr of 0.8 mg/dL). ------------------------------------------------------------------------------------------------------------------ No results for input(s): HGBA1C in the last 72 hours. ------------------------------------------------------------------------------------------------------------------ No results for input(s): CHOL, HDL, LDLCALC, TRIG, CHOLHDL, LDLDIRECT in the last 72 hours. ------------------------------------------------------------------------------------------------------------------ No results for input(s): TSH, T4TOTAL, T3FREE, THYROIDAB in the last 72 hours.  Invalid input(s): FREET3 ------------------------------------------------------------------------------------------------------------------ No results for input(s): VITAMINB12, FOLATE, FERRITIN, TIBC, IRON, RETICCTPCT in the last 72 hours.  Coagulation profile No results for input(s): INR, PROTIME in the last 168 hours.  No results for input(s): DDIMER in the last 72 hours.  Cardiac Enzymes  Recent Labs Lab 08/15/15 1607 08/15/15 2239 08/16/15 0426  TROPONINI 0.03* 0.03*  0.04*   ------------------------------------------------------------------------------------------------------------------ Invalid input(s): POCBNP    Assessment & Plan   This is a 81 y.o. female with a history of home O2 dependent COPD, diabetes, hyperlipidemia and hypertension, second-degree heart block status post pacemaker placement now being admitted with: 1. Acute hypercarbic respiratory failure- Due to uncontrolled sleep apnea Continue BiPAPAt nighttime Continued therapy for COPD exasperation including Solu-Medrol, nebulizer empiric Levaquin for acute bronchitis improved 2.  Acute exacerbation of systolic congestive heart failure now patient having hypotension I'll DC the Lasix until blood pressure stable  3. Acute urinary retention -Foley catheter, voiding trial 3. Hyponatremia, mild Stable likely chronic  4. Hyperkalemia, mild Potassium currently normal 5. Diabetes type 2 continue sliding scale insulin Blood glucose fluctuating continue to monitor 6. Miscellaneous Lovenox for DVT prophylaxis       Code Status Orders        Start     Ordered   08/15/15 1556  Full code  Continuous     08/15/15 1555    Code Status History    Date Active Date Inactive Code Status Order ID Comments User Context   08/15/2015  3:55 PM 08/16/2015  9:10 AM Full Code [id number]  Harvie Bridge, DO Inpatient    Advance Directive Documentation   Flowsheet Row Most Recent Value  Type of Advance Directive  Living will  Pre-existing out of facility DNR order (yellow form or pink MOST form)  No data  "MOST" Form in Place?  No data  Consults  none  DVT Prophylaxis  Lovenox    Lab Results  Component Value Date   PLT 192 08/18/2015     Time Spent in minutes   18min  Dustin Flock M.D on 08/19/2015 at 1:40 PM  Between 7am to 6pm - Pager - [phone]  After 6pm go to [email or web address] - password [email or web address] [place]   Office  [phone]

## 2015-08-19 NOTE — Progress Notes (Signed)
Pt complaining of sore throat and headache that is unrelieved by tylenol and tramadol. Pt requesting ibuprofen. MD Hugelmeyer notified. Verbal order given for 600 mg ibuprofen every 8 hours as needed, and chloraseptic spray as needed.RN will place orders, and adminsiter as ordered. Will continue to monitor.   Iran Sizer M

## 2015-08-20 ENCOUNTER — Inpatient Hospital Stay: Payer: Medicare Other

## 2015-08-20 LAB — BASIC METABOLIC PANEL
ANION GAP: 13 (ref 5–15)
ANION GAP: 5 (ref 5–15)
BUN: 15 mg/dL (ref 6–20)
BUN: 17 mg/dL (ref 6–20)
CALCIUM: 7.1 mg/dL — AB (ref 8.9–10.3)
CALCIUM: 7.1 mg/dL — AB (ref 8.9–10.3)
CHLORIDE: 74 mmol/L — AB (ref 101–111)
CO2: 36 mmol/L — AB (ref 22–32)
CO2: 42 mmol/L — ABNORMAL HIGH (ref 22–32)
CREATININE: 0.69 mg/dL (ref 0.44–1.00)
Chloride: 71 mmol/L — ABNORMAL LOW (ref 101–111)
Creatinine, Ser: 0.74 mg/dL (ref 0.44–1.00)
GFR calc non Af Amer: >60 mL/min (ref >60)
GLUCOSE: 202 mg/dL — AB (ref 65–99)
Glucose, Bld: 87 mg/dL (ref 65–99)
Potassium: 4.5 mmol/L (ref 3.5–5.1)
Potassium: 4.4 mmol/L (ref 3.5–5.1)
Sodium: 123 mmol/L — ABNORMAL LOW (ref 135–145)
Sodium: 118 mmol/L — CL (ref 135–145)

## 2015-08-20 LAB — SODIUM: SODIUM: 122 mmol/L — AB (ref 135–145)

## 2015-08-20 LAB — GLUCOSE, CAPILLARY
GLUCOSE-CAPILLARY: 85 mg/dL (ref 65–99)
GLUCOSE-CAPILLARY: 140 mg/dL — AB (ref 65–99)
Glucose-Capillary: 149 mg/dL — ABNORMAL HIGH (ref 65–99)
Glucose-Capillary: 180 mg/dL — ABNORMAL HIGH (ref 65–99)

## 2015-08-20 MED ORDER — SODIUM CHLORIDE 0.9 % IV SOLN
INTRAVENOUS | Status: DC
  Administered 2015-08-21: 10:00:00 via INTRAVENOUS

## 2015-08-20 MED ORDER — FUROSEMIDE 40 MG PO TABS: 40.0000 mg | ORAL_TABLET | Freq: Two times a day (BID) | ORAL | Status: DC

## 2015-08-20 MED ORDER — SODIUM CHLORIDE 0.9 % IV BOLUS (SEPSIS)
500.0000 mL | Freq: Once | INTRAVENOUS | Status: AC
  Administered 2015-08-20: 500 mL via INTRAVENOUS

## 2015-08-20 MED ORDER — TOLVAPTAN 15 MG PO TABS
15.0000 mg | ORAL_TABLET | ORAL | Status: DC
  Administered 2015-08-20 – 2015-08-23 (×4): 15 mg via ORAL
  Filled 2015-08-20 (×4): qty 1

## 2015-08-20 NOTE — Progress Notes (Signed)
Physical Therapy Treatment Patient Details Name: Kathryn Hayes MRN: [id number] DOB: Mar 16, 1934 Today's Date: 08/20/2015    History of Present Illness Pt is a 81 y/o admitted for acute exacerbation of CHF. Pt admitted to ED due to progressively worsening SOB and B LE edema. PMH significant for COPD, DM, and HTN.    PT Comments    Pt in bed upon PT arrival, on 4L O2 [place]. Pt reports she has a headache today but is able to participate in therapy. Pt requires min assist/guard for all supine ther-ex, O2 WNL. Pt requires +2 mod-max assist for bed mobility, transfers, and ambulation. Pt demonstrates posterior lean throughout session in sitting and standing, unable to maintain correction after verbal cueing. Pt O2 drops to 86%-88% with supine to sit, increases to 91% within 60 seconds. Dizziness with change in position that subsides after ~60 seconds. Reports B shoulder and cervical pain upon standing, goes away after pt placed back in bed. Pt ambulates ~5 ft to [facility], reports not feeling well and some SOB. O2 at 85%, after ~2-3 mins increased to 92%. +3 assist for safety and hygiene with BSC use. Pt limited by fatigue, pain, and decreased O2 saturation today. Pt will benefit from skilled PT services in order to improve strength, ROM, balance, endurance, functional mobility, and safety with DME use. At this time pt is appropriate for STR to address deficits.    Follow Up Recommendations  SNF     Equipment Recommendations  Rolling walker with 5" wheels    Recommendations for Other Services       Precautions / Restrictions Precautions Precautions: Fall Restrictions Weight Bearing Restrictions: No    Mobility  Bed Mobility Overal bed mobility: Needs Assistance Bed Mobility: Supine to Sit     Supine to sit: Mod assist;+2 for physical assistance Sit to supine: Mod assist;Max assist;+2 for physical assistance   General bed mobility comments: Pt requires verbal cueing to reach for railings. Pt  able to scoot LE to EOB, but requiers mod-max assist to lift trunk off of bed. Max assist to scoot up to [facility] at end of session.  Transfers Overall transfer level: Needs assistance Equipment used: Rolling walker (2 wheeled) Transfers: Sit to/from Stand Sit to Stand: Mod assist;Max assist;+2 physical assistance         General transfer comment: Verbal cueing for proper hand placement on RW, reports B shoulder pain upon standing. Pt demonstrates increased posterior lean with standing, mod-max verbal and tactile cueing to correct bu pt unable to maintain position.   Ambulation/Gait Ambulation/Gait assistance: Mod assist;Max assist;+2 physical assistance Ambulation Distance (Feet): 5 Feet Assistive device: Rolling walker (2 wheeled) Gait Pattern/deviations: Step-to pattern;Decreased step length - right;Decreased step length - left;Decreased stride length;Shuffle;Leaning posteriorly;Trunk flexed   Gait velocity interpretation: Below normal speed for age/gender General Gait Details: Pt continues to takes very short steps, requiring +2 mod-max assist today. Verbal cueing to decrease posterior lean and for sequencing of movement. Pt reports being very fatigued.   Stairs            Wheelchair Mobility    Modified Rankin (Stroke Patients Only)       Balance Overall balance assessment: Needs assistance Sitting-balance support: Bilateral upper extremity supported;Feet supported Sitting balance-Leahy Scale: Good Sitting balance - Comments: Verbal cueing to decrease posterior lean in seated. Min assist/guard to maintain sitting balance.   Standing balance support: Bilateral upper extremity supported Standing balance-Leahy Scale: Fair Standing balance comment: Pt requires mod-max assist to maintain standing  balance with UE support on RW. Verbal cueing to decreaser posterior lean but pt unable to maintain position. Posterior lean increases with fatigue.                     Cognition Arousal/Alertness: Awake/alert Behavior During Therapy: WFL for tasks assessed/performed Overall Cognitive Status: Within Functional Limits for tasks assessed                      Exercises Other Exercises Other Exercises: Supine ther ex: B LE ankle pumps, heel slides, SLRs, quad sets, SAQs, hip abd/add x 12 reps with min assist/guard, verbal and tactile cueing for proper technique Other Exercises: Pt transferred to [facility] for BM. Mod-max assist with sit/stand transfers. +3 assist for safety and hygiene.     General Comments        Pertinent Vitals/Pain Pain Assessment: Faces Faces Pain Scale: Hurts little more Pain Location: B shoulder and neck upon standing Pain Intervention(s): Limited activity within patient's tolerance;Monitored during session    Home Living                      Prior Function            PT Goals (current goals can now be found in the care plan section) Acute Rehab PT Goals Patient Stated Goal: To move around PT Goal Formulation: With patient Time For Goal Achievement: 09/01/15 Potential to Achieve Goals: Good Progress towards PT goals: Progressing toward goals    Frequency  Min 2X/week    PT Plan Current plan remains appropriate    Co-evaluation             End of Session Equipment Utilized During Treatment: Gait belt;Oxygen Activity Tolerance: Patient limited by fatigue Patient left: in bed;with call bell/phone within reach;with bed alarm set     Time: [id number] PT Time Calculation (min) (ACUTE ONLY): 43 min  Charges:                       G Codes:      Georgina Pillion Aug 31, 2015, 1:38 PM Georgina Pillion, SPT [phone]

## 2015-08-20 NOTE — Progress Notes (Signed)
[place] [facility]                                                                                                                                                                                            Patient Demographics   Kathryn Hayes, is a 81 y.o. female, DOB - Jun 01, 1934, [id number]  Admit date - 08/15/2015   Admitting Physician Lavonia Drafts, MD  Outpatient Primary MD for the patient is [name], MD   LOS - 4  Subjective Patient doing better however her sodium has trended down to 123 today. Breathing improved.      Review of Systems:   CONSTITUTIONAL: No documented fever. No fatigue, weakness. No weight gain, no weight loss.  EYES: No blurry or double vision.  ENT: No tinnitus. No postnasal drip. No redness of the oropharynx.  RESPIRATORY: No cough, no wheeze, no hemoptysis. Positive dyspnea. Improved CARDIOVASCULAR: No chest pain. No orthopnea. No palpitations. No syncope. Dizziness with standing  GASTROINTESTINAL: No nausea, no vomiting or diarrhea. No abdominal pain. No melena or hematochezia.  GENITOURINARY: No dysuria or hematuria.  ENDOCRINE: No polyuria or nocturia. No heat or cold intolerance.  HEMATOLOGY: No anemia. No bruising. No bleeding.  INTEGUMENTARY: No rashes. No lesions.  MUSCULOSKELETAL: No arthritis. No swelling. No gout.  NEUROLOGIC: No numbness, tingling, or ataxia. No seizure-type activity. Sleepy PSYCHIATRIC: No anxiety. No insomnia. No ADD.    Vitals:   Vitals:   08/19/15 2151 08/20/15 0420 08/20/15 0500 08/20/15 1017  BP: 109/68 104/63  (!) 128/46  Pulse: 83 80  89  Resp:  (!) 27 20 18   Temp:  97.6 F (36.4 C)    TempSrc:  Oral    SpO2:  (!) 88% 93% 98%  Weight:   112.9 kg (248 lb 12.8 oz)   Height:        Wt Readings from Last 3 Encounters:  08/20/15 112.9 kg (248 lb 12.8 oz)  05/16/14 102.5 kg (226 lb)  05/03/14 103.3 kg (227 lb 12.8 oz)     Intake/Output Summary (Last 24  hours) at 08/20/15 1506 Last data filed at 08/20/15 1344  Gross per 24 hour  Intake             1320 ml  Output              700 ml  Net              620 ml    Physical Exam:   GENERAL: morbidely obese females.  On by HEAD, EYES, EARS, NOSE AND THROAT: Atraumatic, normocephalic. Extraocular muscles are intact.  Pupils equal and reactive to light. Sclerae anicteric. No conjunctival injection. No oro-pharyngeal erythema.  NECK: Supple. There is no jugular venous distention. No bruits, no lymphadenopathy, no thyromegaly.  HEART: Regular rate and rhythm,. No murmurs, no rubs, no clicks.  LUNGS:  Decreased bs, No rales or rhonchi. No wheezes.  ABDOMEN: Soft, flat, nontender, nondistended. Has good bowel sounds. No hepatosplenomegaly appreciated.  EXTREMITIES: No evidence of any cyanosis, clubbing, or 1+ peripheral edema.  +2 pedal and radial pulses bilaterally.  NEUROLOGIC: The patient is alert, awake, and oriented x3 with no focal motor or sensory deficits appreciated bilaterally.  SKIN: Moist and warm with no rashes appreciated.  Psych: Not anxious, depressed LN: No inguinal LN enlargement    Antibiotics   Anti-infectives    Start     Dose/Rate Route Frequency Ordered Stop   08/16/15 1400  levofloxacin (LEVAQUIN) tablet 500 mg     500 mg Oral Daily 08/16/15 0846        Medications   Scheduled Meds: . aspirin EC  81 mg Oral Daily  . clotrimazole  10 mg Oral QID  . DULoxetine  60 mg Oral Daily  . enoxaparin (LOVENOX) injection  40 mg Subcutaneous Q12H  . escitalopram  20 mg Oral Daily  . Ferrous Fumarate  1 tablet Oral Daily  . gabapentin  400 mg Oral TID  . insulin aspart  0-12 Units Subcutaneous TID AC & HS  . insulin glargine  6 Units Subcutaneous Daily  . ipratropium  2 spray Nasal QID  . levofloxacin  500 mg Oral Daily  . magnesium oxide  400 mg Oral Daily  . metoCLOPramide  5 mg Oral TID AC  . metoprolol tartrate  12.5 mg Oral BID  . mometasone-formoterol  2 puff  Inhalation BID  . pantoprazole  40 mg Oral BID  . polyethylene glycol  17 g Oral Daily  . pravastatin  40 mg Oral Daily  . senna-docusate  1 tablet Oral BID  . sodium chloride flush  3 mL Intravenous Q12H  . sucralfate  1 g Oral TID WC & HS  . tamsulosin  0.4 mg Oral Daily  . tiotropium  18 mcg Inhalation Daily  . tolvaptan  15 mg Oral Q24H  . traZODone  100 mg Oral QHS  . vitamin B-12  1,000 mcg Oral Daily   Continuous Infusions:  PRN Meds:.sodium chloride, acetaminophen, albuterol, benzonatate, ibuprofen, LORazepam, ondansetron (ZOFRAN) IV, phenol, sodium chloride flush, traMADol   Data Review:   Micro Results Recent Results (from the past 240 hour(s))  MRSA PCR Screening     Status: None   Collection Time: 08/16/15 11:53 AM  Result Value Ref Range Status   MRSA by PCR NEGATIVE NEGATIVE Final    Comment:        The GeneXpert MRSA Assay (FDA approved for NASAL specimens only), is one component of a comprehensive MRSA colonization surveillance program. It is not intended to diagnose MRSA infection nor to guide or monitor treatment for MRSA infections.     Radiology Reports Dg Chest 1 View  Result Date: 08/20/2015 CLINICAL DATA:  Shortness of breath . EXAM: CHEST 1 VIEW COMPARISON:  08/15/2015. FINDINGS: Cardiac pacer with lead tips in right atrium and right ventricle. Cardiomegaly with normal pulmonary vascularity. Interim improvement bilateral pulmonary interstitial edema and pleural effusions. No pneumothorax. IMPRESSION: 1. Cardiac pacer in stable position.  Stable cardiomegaly. 2. Interim improvement of pulmonary interstitial edema and bilateral pleural effusions. Electronically Signed   By: Marcello Moores  Register   On: 08/20/2015 07:35   Dg Chest Portable 1 View  Result Date: 08/15/2015 CLINICAL DATA:  Shortness of breath. EXAM: PORTABLE CHEST 1 VIEW COMPARISON:  Radiographs of May 21, 2014. FINDINGS: Stable cardiomegaly. Left-sided pacemaker is unchanged in position.  Atherosclerosis of thoracic aorta is noted. No pneumothorax is noted. Increased bibasilar opacities are noted concerning for edema or atelectasis with mild associated pleural effusions. Bony thorax is unremarkable. IMPRESSION: Increased bibasilar opacities are noted concerning for edema or atelectasis with mild associated pleural effusions. Aortic atherosclerosis. Electronically Signed   By: Marijo Conception, M.D.   On: 08/15/2015 13:45     CBC  Recent Labs Lab 08/15/15 1303 08/18/15 0441  WBC 7.3 5.7  HGB 10.7* 11.4*  HCT 33.5* 34.4*  PLT 180 192  MCV 89.5 86.6  MCH 28.6 28.7  MCHC 32.0 33.1  RDW 14.6* 14.1    Chemistries   Recent Labs Lab 08/15/15 1303 08/16/15 0426 08/17/15 0448 08/18/15 0441 08/19/15 0349 08/20/15 0439 08/20/15 1004  NA 128* 127* 129* 128* 128* 123* 122*  K 5.3* 4.7 4.5 3.8 4.5 4.5  --   CL 88* 83* 83* 78* 78* 74*  --   CO2 36* 39* 38* 42* 45* 36*  --   GLUCOSE 181* 105* 128* 154* 171* 87  --   BUN 10 11 15 15 16 15   --   CREATININE 0.57 0.63 0.67 0.67 0.63 0.74  --   CALCIUM 7.7* 7.6* 7.6* 7.7* 7.4* 7.1*  --   AST 12*  --   --   --   --   --   --   ALT 10*  --   --   --   --   --   --   ALKPHOS 108  --   --   --   --   --   --   BILITOT 0.7  --   --   --   --   --   --    ------------------------------------------------------------------------------------------------------------------ estimated creatinine clearance is 64 mL/min (by C-G formula based on SCr of 0.8 mg/dL). ------------------------------------------------------------------------------------------------------------------ No results for input(s): HGBA1C in the last 72 hours. ------------------------------------------------------------------------------------------------------------------ No results for input(s): CHOL, HDL, LDLCALC, TRIG, CHOLHDL, LDLDIRECT in the last 72  hours. ------------------------------------------------------------------------------------------------------------------ No results for input(s): TSH, T4TOTAL, T3FREE, THYROIDAB in the last 72 hours.  Invalid input(s): FREET3 ------------------------------------------------------------------------------------------------------------------ No results for input(s): VITAMINB12, FOLATE, FERRITIN, TIBC, IRON, RETICCTPCT in the last 72 hours.  Coagulation profile No results for input(s): INR, PROTIME in the last 168 hours.  No results for input(s): DDIMER in the last 72 hours.  Cardiac Enzymes  Recent Labs Lab 08/15/15 1607 08/15/15 2239 08/16/15 0426  TROPONINI 0.03* 0.03* 0.04*   ------------------------------------------------------------------------------------------------------------------ Invalid input(s): POCBNP    Assessment & Plan   This is a 81 y.o. female with a history of home O2 dependent COPD, diabetes, hyperlipidemia and hypertension, second-degree heart block status post pacemaker placement now being admitted with: 1. Acute hypercarbic respiratory failure- Due to uncontrolled sleep apnea Continue BiPAPAt nighttime Continued therapy for COPD exasperation , nebulizer empiric Stop Levaquin for acute bronchitis improved 2. Hyponatremia I have discussed the case with nephrology is recommending one dose of trovptan and will stop Lasix and follow sodium every 8 hours if sodium becomes low again nephrology will see the patient 3.  Acute exacerbation of systolic congestive heart failure Lasix on hold for time beiing on Trovaptan 4.Acute urinary retention -Foley catheter, voiding trial   5  Hyperkalemia, mild Potassium currently normal 6. Diabetes type 2 continue sliding scale insulin Blood glucose fluctuating continue to monitor 7. Miscellaneous Lovenox for DVT prophylaxis       Code Status Orders        Start     Ordered   08/15/15 1556  Full code  Continuous      08/15/15 1555    Code Status History    Date Active Date Inactive Code Status Order ID Comments User Context   08/15/2015  3:55 PM 08/16/2015  9:10 AM Full Code [id number]  Harvie Bridge, DO Inpatient    Advance Directive Documentation   Flowsheet Row Most Recent Value  Type of Advance Directive  Living will  Pre-existing out of facility DNR order (yellow form or pink MOST form)  No data  "MOST" Form in Place?  No data           Consults  none  DVT Prophylaxis  Lovenox    Lab Results  Component Value Date   PLT 192 08/18/2015     Time Spent in minutes   74min  Dustin Flock M.D on 08/20/2015 at 3:06 PM  Between 7am to 6pm - Pager - [phone]  After 6pm go to [email or web address] - password [email or web address] [facility]   Office  [phone]

## 2015-08-20 NOTE — Progress Notes (Signed)
Patient resting in the bed at this time, vss, ibuprofen 600mg  geven po for headache as per pt request, will continue to monitor .

## 2015-08-20 NOTE — Care Management (Signed)
Barrier to discharge: NA down to 123. MD replacing electrolyte.

## 2015-08-21 ENCOUNTER — Encounter
Admission: RE | Admit: 2015-08-21 | Discharge: 2015-08-21 | Disposition: A | Discharge status: Home / Self Care | Payer: Medicare Other | Source: Ambulatory Visit | Attending: Internal Medicine | Admitting: Internal Medicine

## 2015-08-21 LAB — BASIC METABOLIC PANEL
ANION GAP: 6 (ref 5–15)
BUN: 17 mg/dL (ref 6–20)
CALCIUM: 6.8 mg/dL — AB (ref 8.9–10.3)
CHLORIDE: 74 mmol/L — AB (ref 101–111)
CO2: 41 mmol/L — AB (ref 22–32)
CREATININE: 0.64 mg/dL (ref 0.44–1.00)
GFR calc Af Amer: >60 mL/min (ref >60)
GFR calc non Af Amer: >60 mL/min (ref >60)
GLUCOSE: 82 mg/dL (ref 65–99)
Potassium: 4.3 mmol/L (ref 3.5–5.1)
Sodium: 121 mmol/L — ABNORMAL LOW (ref 135–145)

## 2015-08-21 LAB — SODIUM
Sodium: 118 mmol/L — CL (ref 135–145)
Sodium: 121 mmol/L — ABNORMAL LOW (ref 135–145)
Sodium: 120 mmol/L — ABNORMAL LOW (ref 135–145)
Sodium: 122 mmol/L — ABNORMAL LOW (ref 135–145)

## 2015-08-21 LAB — SODIUM, URINE, RANDOM

## 2015-08-21 LAB — URIC ACID: Uric Acid, Serum: 5.4 mg/dL (ref 2.3–6.6)

## 2015-08-21 LAB — GLUCOSE, CAPILLARY
GLUCOSE-CAPILLARY: 158 mg/dL — AB (ref 65–99)
Glucose-Capillary: 86 mg/dL (ref 65–99)
Glucose-Capillary: 118 mg/dL — ABNORMAL HIGH (ref 65–99)
Glucose-Capillary: 194 mg/dL — ABNORMAL HIGH (ref 65–99)

## 2015-08-21 LAB — TSH: TSH: 3.338 u[IU]/mL (ref 0.350–4.500)

## 2015-08-21 LAB — CREATININE, URINE, RANDOM: CREATININE, URINE: 14 mg/dL

## 2015-08-21 NOTE — Clinical Social Work Note (Signed)
MSW received a phone call from [facility] that patient is not able to go to SNF.  MSW spoke to patient and her daughter and informed them of other bed offers available.  Patient requested to have her daughter make decision on SNF, and patient's daughter would like her to go to [facility].  MSW contacted [facility] to inform them of patient's choice, MSW to continue to follow patient's progress throughout discharge planning.  Jones Broom. Norval Morton, MSW [phone]  Mon-Fri 8a-4:30p 08/21/2015 4:45 PM

## 2015-08-21 NOTE — Plan of Care (Signed)
Problem: Activity: Goal: Capacity to carry out activities will improve Outcome: Not Progressing Patient is weak, requires a lot of assistance for mobility.

## 2015-08-21 NOTE — Progress Notes (Signed)
[facility]  ROUNDING NOTE   Subjective:  Patient well-known to [place] from prior admission in February of 2016. At that time we will asked to see her for hyponatremia. Patient has [place]-standing COPD, secondary status post pacemaker placement, hypertension, and diabetes mellitus type 2. She was admitted for increasing shortness of breath. She has been diuresed with Lasix. Patient was given one dose of tolvaptan yesterday.   Earlier this a.m. She was on BiPAP.    Objective:  Vital signs in last 24 hours:  Temp:  [97.5 F (36.4 C)-98.9 F (37.2 C)] 98.1 F (36.7 C) (08/17 1113) Pulse Rate:  [48-86] 48 (08/17 1113) Resp:  [18-20] 20 (08/17 1113) BP: (107-154)/(42-113) 107/42 (08/17 0801) SpO2:  [89 %-100 %] 95 % (08/17 1113) Weight:  [112.8 kg (248 lb 10.9 oz)] 112.8 kg (248 lb 10.9 oz) (08/17 0510)  Weight change: -0.055 kg (-1.9 oz) Filed Weights   08/19/15 0635 08/20/15 0500 08/21/15 0510  Weight: 107.8 kg (237 lb 9.6 oz) 112.9 kg (248 lb 12.8 oz) 112.8 kg (248 lb 10.9 oz)    Intake/Output: I/O last 3 completed shifts: In: 2157.5 [P.O.:1320; I.V.:337.5; IV Piggyback:500] Out: 401 [Urine:401]   Intake/Output this shift:  Total I/O In: 120 [P.O.:120] Out: -   Physical Exam: General: No acute distress, laying in bed  Head: Normocephalic, atraumatic. Moist oral mucosal membranes  Eyes: Anicteric  Neck: supple  Lungs:  Scattered rhonchi, normal effort  Heart: S1S2 no rubs  Abdomen:  Soft, nontender, BS present  Extremities: trace peripheral edema.  Neurologic: Nonfocal, moving all four extremities  Skin: No lesions       Basic Metabolic Panel:  Recent Labs Lab 08/18/15 0441 08/19/15 0349 08/20/15 0439  08/20/15 1842 08/20/15 2353 08/21/15 0415 08/21/15 0833 08/21/15 1215  NA 128* 128* 123*  < > 118* 118* 121* 121* 120*  K 3.8 4.5 4.5  --  4.4  --  4.3  --   --   CL 78* 78* 74*  --  71*  --  74*  --   --   CO2 42* 45* 36*  --  42*  --  41*   --   --   GLUCOSE 154* 171* 87  --  202*  --  82  --   --   BUN 15 16 15   --  17  --  17  --   --   CREATININE 0.67 0.63 0.74  --  0.69  --  0.64  --   --   CALCIUM 7.7* 7.4* 7.1*  --  7.1*  --  6.8*  --   --   < > = values in this interval not displayed.  Liver Function Tests:  Recent Labs Lab 08/15/15 1303  AST 12*  ALT 10*  ALKPHOS 108  BILITOT 0.7  PROT 5.8*  ALBUMIN 3.1*   No results for input(s): LIPASE, AMYLASE in the last 168 hours. No results for input(s): AMMONIA in the last 168 hours.  CBC:  Recent Labs Lab 08/15/15 1303 08/18/15 0441  WBC 7.3 5.7  HGB 10.7* 11.4*  HCT 33.5* 34.4*  MCV 89.5 86.6  PLT 180 192    Cardiac Enzymes:  Recent Labs Lab 08/15/15 1303 08/15/15 1607 08/15/15 2239 08/16/15 0426  TROPONINI 0.03* 0.03* 0.03* 0.04*    BNP: Invalid input(s): POCBNP  CBG:  Recent Labs Lab 08/20/15 1150 08/20/15 1644 08/20/15 2112 08/21/15 0727 08/21/15 1129  GLUCAP 140* 149* 180* 86 118*  Microbiology: Results for orders placed or performed during the hospital encounter of 08/15/15  MRSA PCR Screening     Status: None   Collection Time: 08/16/15 11:53 AM  Result Value Ref Range Status   MRSA by PCR NEGATIVE NEGATIVE Final    Comment:        The GeneXpert MRSA Assay (FDA approved for NASAL specimens only), is one component of a comprehensive MRSA colonization surveillance program. It is not intended to diagnose MRSA infection nor to guide or monitor treatment for MRSA infections.     Coagulation Studies: No results for input(s): LABPROT, INR in the last 72 hours.  Urinalysis: No results for input(s): COLORURINE, LABSPEC, PHURINE, GLUCOSEU, HGBUR, BILIRUBINUR, KETONESUR, PROTEINUR, UROBILINOGEN, NITRITE, LEUKOCYTESUR in the last 72 hours.  Invalid input(s): APPERANCEUR    Imaging: Dg Chest 1 View  Result Date: 08/20/2015 CLINICAL DATA:  Shortness of breath . EXAM: CHEST 1 VIEW COMPARISON:  08/15/2015. FINDINGS:  Cardiac pacer with lead tips in right atrium and right ventricle. Cardiomegaly with normal pulmonary vascularity. Interim improvement bilateral pulmonary interstitial edema and pleural effusions. No pneumothorax. IMPRESSION: 1. Cardiac pacer in stable position.  Stable cardiomegaly. 2. Interim improvement of pulmonary interstitial edema and bilateral pleural effusions. Electronically Signed   By: Marcello Moores  Register   On: 08/20/2015 07:35     Medications:   . sodium chloride 50 mL/hr at 08/21/15 0942   . aspirin EC  81 mg Oral Daily  . clotrimazole  10 mg Oral QID  . DULoxetine  60 mg Oral Daily  . enoxaparin (LOVENOX) injection  40 mg Subcutaneous Q12H  . escitalopram  20 mg Oral Daily  . Ferrous Fumarate  1 tablet Oral Daily  . gabapentin  400 mg Oral TID  . insulin aspart  0-12 Units Subcutaneous TID AC & HS  . insulin glargine  6 Units Subcutaneous Daily  . ipratropium  2 spray Nasal QID  . magnesium oxide  400 mg Oral Daily  . metoCLOPramide  5 mg Oral TID AC  . metoprolol tartrate  12.5 mg Oral BID  . mometasone-formoterol  2 puff Inhalation BID  . pantoprazole  40 mg Oral BID  . polyethylene glycol  17 g Oral Daily  . pravastatin  40 mg Oral Daily  . senna-docusate  1 tablet Oral BID  . sucralfate  1 g Oral TID WC & HS  . tamsulosin  0.4 mg Oral Daily  . tiotropium  18 mcg Inhalation Daily  . tolvaptan  15 mg Oral Q24H  . traZODone  100 mg Oral QHS  . vitamin B-12  1,000 mcg Oral Daily   acetaminophen, albuterol, benzonatate, ibuprofen, LORazepam, ondansetron (ZOFRAN) IV, phenol, traMADol  Assessment/ Plan:  81 y.o. female with past medical history of breast cancer, COPD, diabetes mellitus type 2, hyperlipidemia, hypertension congestive heart failure ejection fraction 45-50% who was admitted with shortness of breath.  1.  Hyponatremia 2.  Acute systolic heart failure. 3.  Acute respiratory failure requiring periodic bipap  Plan:  The patient originally came in with  volume overload.  The patient has undergone diuresis with worsening serum sodium.  The patient has been started on 0.9 normal saline which we will hold for now.  Continue told that can for now but we will increase the dosage to 30 mg by mouth daily.  We will also order every 6 serum sodiums.  In addition we will check uric acid, TSH, cortisol, serum osmolality, urine osmolality, and urine sodium.  Further plan to  be determined based upon response to therapy.   LOS: 5 Ellyn Rubiano 8/17/20174:34 PM

## 2015-08-21 NOTE — Progress Notes (Signed)
[facility]                                                                                                                                                                                            Patient Demographics   Kathryn Hayes, is a 81 y.o. female, DOB - 19-Aug-1934, [id number]  Admit date - 08/15/2015   Admitting Physician Lavonia Drafts, MD  Outpatient Primary MD for the patient is DAVID BRONSTEIN, MD   LOS - 5  Subjective Patient's sodium is even lower today she feels little weaker    Review of Systems:   CONSTITUTIONAL: No documented fever. No fatigue,Positive weakness. No weight gain, no weight loss.  EYES: No blurry or double vision.  ENT: No tinnitus. No postnasal drip. No redness of the oropharynx.  RESPIRATORY: No cough, no wheeze, no hemoptysis. Positive dyspnea. Improved CARDIOVASCULAR: No chest pain. No orthopnea. No palpitations. No syncope. Dizziness with standing  GASTROINTESTINAL: No nausea, no vomiting or diarrhea. No abdominal pain. No melena or hematochezia.  GENITOURINARY: No dysuria or hematuria.  ENDOCRINE: No polyuria or nocturia. No heat or cold intolerance.  HEMATOLOGY: No anemia. No bruising. No bleeding.  INTEGUMENTARY: No rashes. No lesions.  MUSCULOSKELETAL: No arthritis. No swelling. No gout.  NEUROLOGIC: No numbness, tingling, or ataxia. No seizure-type activity. Sleepy PSYCHIATRIC: No anxiety. No insomnia. No ADD.    Vitals:   Vitals:   08/21/15 0510 08/21/15 0535 08/21/15 0801 08/21/15 1113  BP:  (!) 116/54 (!) 107/42   Pulse:  77 75 (!) 48  Resp:    20  Temp:    98.1 F (36.7 C)  TempSrc:    Oral  SpO2:   (!) 89% 95%  Weight: 112.8 kg (248 lb 10.9 oz)     Height:        Wt Readings from Last 3 Encounters:  08/21/15 112.8 kg (248 lb 10.9 oz)  05/16/14 102.5 kg (226 lb)  05/03/14 103.3 kg (227 lb 12.8 oz)     Intake/Output Summary (Last 24 hours) at 08/21/15 1226 Last data  filed at 08/21/15 0300  Gross per 24 hour  Intake           1557.5 ml  Output              401 ml  Net           1156.5 ml    Physical Exam:   GENERAL: morbidely obese females.  On by HEAD, EYES, EARS, NOSE AND THROAT: Atraumatic, normocephalic. Extraocular muscles are intact. Pupils equal and reactive to light. Sclerae anicteric. No conjunctival  injection. No oro-pharyngeal erythema.  NECK: Supple. There is no jugular venous distention. No bruits, no lymphadenopathy, no thyromegaly.  HEART: Regular rate and rhythm,. No murmurs, no rubs, no clicks.  LUNGS:  Decreased bs, No rales or rhonchi. No wheezes.  ABDOMEN: Soft, flat, nontender, nondistended. Has good bowel sounds. No hepatosplenomegaly appreciated.  EXTREMITIES: No evidence of any cyanosis, clubbing, or 1+ peripheral edema.  +2 pedal and radial pulses bilaterally.  NEUROLOGIC: The patient is alert, awake, and oriented x3 with no focal motor or sensory deficits appreciated bilaterally.  SKIN: Moist and warm with no rashes appreciated.  Psych: Not anxious, depressed LN: No inguinal LN enlargement    Antibiotics   Anti-infectives    Start     Dose/Rate Route Frequency Ordered Stop   08/16/15 1400  levofloxacin (LEVAQUIN) tablet 500 mg  Status:  Discontinued     500 mg Oral Daily 08/16/15 0846 08/20/15 1528      Medications   Scheduled Meds: . aspirin EC  81 mg Oral Daily  . clotrimazole  10 mg Oral QID  . DULoxetine  60 mg Oral Daily  . enoxaparin (LOVENOX) injection  40 mg Subcutaneous Q12H  . escitalopram  20 mg Oral Daily  . Ferrous Fumarate  1 tablet Oral Daily  . gabapentin  400 mg Oral TID  . insulin aspart  0-12 Units Subcutaneous TID AC & HS  . insulin glargine  6 Units Subcutaneous Daily  . ipratropium  2 spray Nasal QID  . magnesium oxide  400 mg Oral Daily  . metoCLOPramide  5 mg Oral TID AC  . metoprolol tartrate  12.5 mg Oral BID  . mometasone-formoterol  2 puff Inhalation BID  . pantoprazole  40 mg  Oral BID  . polyethylene glycol  17 g Oral Daily  . pravastatin  40 mg Oral Daily  . senna-docusate  1 tablet Oral BID  . sucralfate  1 g Oral TID WC & HS  . tamsulosin  0.4 mg Oral Daily  . tiotropium  18 mcg Inhalation Daily  . tolvaptan  15 mg Oral Q24H  . traZODone  100 mg Oral QHS  . vitamin B-12  1,000 mcg Oral Daily   Continuous Infusions: . sodium chloride 50 mL/hr at 08/21/15 0942   PRN Meds:.acetaminophen, albuterol, benzonatate, ibuprofen, LORazepam, ondansetron (ZOFRAN) IV, phenol, traMADol   Data Review:   Micro Results Recent Results (from the past 240 hour(s))  MRSA PCR Screening     Status: None   Collection Time: 08/16/15 11:53 AM  Result Value Ref Range Status   MRSA by PCR NEGATIVE NEGATIVE Final    Comment:        The GeneXpert MRSA Assay (FDA approved for NASAL specimens only), is one component of a comprehensive MRSA colonization surveillance program. It is not intended to diagnose MRSA infection nor to guide or monitor treatment for MRSA infections.     Radiology Reports Dg Chest 1 View  Result Date: 08/20/2015 CLINICAL DATA:  Shortness of breath . EXAM: CHEST 1 VIEW COMPARISON:  08/15/2015. FINDINGS: Cardiac pacer with lead tips in right atrium and right ventricle. Cardiomegaly with normal pulmonary vascularity. Interim improvement bilateral pulmonary interstitial edema and pleural effusions. No pneumothorax. IMPRESSION: 1. Cardiac pacer in stable position.  Stable cardiomegaly. 2. Interim improvement of pulmonary interstitial edema and bilateral pleural effusions. Electronically Signed   By: Marcello Moores  Register   On: 08/20/2015 07:35   Dg Chest Portable 1 View  Result Date: 08/15/2015 CLINICAL DATA:  Shortness of breath. EXAM: PORTABLE CHEST 1 VIEW COMPARISON:  Radiographs of May 21, 2014. FINDINGS: Stable cardiomegaly. Left-sided pacemaker is unchanged in position. Atherosclerosis of thoracic aorta is noted. No pneumothorax is noted. Increased  bibasilar opacities are noted concerning for edema or atelectasis with mild associated pleural effusions. Bony thorax is unremarkable. IMPRESSION: Increased bibasilar opacities are noted concerning for edema or atelectasis with mild associated pleural effusions. Aortic atherosclerosis. Electronically Signed   By: Marijo Conception, M.D.   On: 08/15/2015 13:45     CBC  Recent Labs Lab 08/15/15 1303 08/18/15 0441  WBC 7.3 5.7  HGB 10.7* 11.4*  HCT 33.5* 34.4*  PLT 180 192  MCV 89.5 86.6  MCH 28.6 28.7  MCHC 32.0 33.1  RDW 14.6* 14.1    Chemistries   Recent Labs Lab 08/15/15 1303  08/18/15 0441 08/19/15 0349 08/20/15 0439 08/20/15 1004 08/20/15 1842 08/20/15 2353 08/21/15 0415 08/21/15 0833  NA 128*  < > 128* 128* 123* 122* 118* 118* 121* 121*  K 5.3*  < > 3.8 4.5 4.5  --  4.4  --  4.3  --   CL 88*  < > 78* 78* 74*  --  71*  --  74*  --   CO2 36*  < > 42* 45* 36*  --  42*  --  41*  --   GLUCOSE 181*  < > 154* 171* 87  --  202*  --  82  --   BUN 10  < > 15 16 15   --  17  --  17  --   CREATININE 0.57  < > 0.67 0.63 0.74  --  0.69  --  0.64  --   CALCIUM 7.7*  < > 7.7* 7.4* 7.1*  --  7.1*  --  6.8*  --   AST 12*  --   --   --   --   --   --   --   --   --   ALT 10*  --   --   --   --   --   --   --   --   --   ALKPHOS 108  --   --   --   --   --   --   --   --   --   BILITOT 0.7  --   --   --   --   --   --   --   --   --   < > = values in this interval not displayed. ------------------------------------------------------------------------------------------------------------------ estimated creatinine clearance is 63.9 mL/min (by C-G formula based on SCr of 0.8 mg/dL). ------------------------------------------------------------------------------------------------------------------ No results for input(s): HGBA1C in the last 72 hours. ------------------------------------------------------------------------------------------------------------------ No results for input(s):  CHOL, HDL, LDLCALC, TRIG, CHOLHDL, LDLDIRECT in the last 72 hours. ------------------------------------------------------------------------------------------------------------------ No results for input(s): TSH, T4TOTAL, T3FREE, THYROIDAB in the last 72 hours.  Invalid input(s): FREET3 ------------------------------------------------------------------------------------------------------------------ No results for input(s): VITAMINB12, FOLATE, FERRITIN, TIBC, IRON, RETICCTPCT in the last 72 hours.  Coagulation profile No results for input(s): INR, PROTIME in the last 168 hours.  No results for input(s): DDIMER in the last 72 hours.  Cardiac Enzymes  Recent Labs Lab 08/15/15 1607 08/15/15 2239 08/16/15 0426  TROPONINI 0.03* 0.03* 0.04*   ------------------------------------------------------------------------------------------------------------------ Invalid input(s): POCBNP    Assessment & Plan   This is a 81 y.o. female with a history of home O2 dependent COPD, diabetes, hyperlipidemia and hypertension, second-degree heart block  status post pacemaker placement now being admitted with: 1. Acute hypercarbic respiratory failure- Due to uncontrolled sleep apnea Continue CPAP at nighttime Continued therapy for COPD exasperation , nebulizer empiric status post therapy with antibiotics for acute bronchitis  2. Hyponatremia I persist and his worst nephrology will see the patient 3.  Acute exacerbation of systolic congestive heart failure Lasix on hold for time beiing on Trovaptan 4.Acute urinary retention -Foley catheter, voiding trial once stable  5 Hyperkalemia, mild Potassium currently normal 6. Diabetes type 2 continue sliding scale insulin Blood glucose fluctuating continue to monitor 7. Miscellaneous Lovenox for DVT prophylaxis  I have updated her daughter regarding disposition     Code Status Orders        Start     Ordered   08/15/15 1556  Full code  Continuous      08/15/15 1555    Code Status History    Date Active Date Inactive Code Status Order ID Comments User Context   08/15/2015  3:55 PM 08/16/2015  9:10 AM Full Code [id number]  Harvie Bridge, DO Inpatient    Advance Directive Documentation   Flowsheet Row Most Recent Value  Type of Advance Directive  Living will  Pre-existing out of facility DNR order (yellow form or pink MOST form)  No data  "MOST" Form in Place?  No data           Consults  none  DVT Prophylaxis  Lovenox    Lab Results  Component Value Date   PLT 192 08/18/2015     Time Spent in minutes   69min  Dustin Flock M.D on 08/21/2015 at 12:26 PM  Between 7am to 6pm - Pager - [phone]  After 6pm go to [email or web address] - password [email or web address] [facility]   Office  [phone]

## 2015-08-21 NOTE — Progress Notes (Signed)
Patients sodium level has dropped to118, MD is aware. No changes in mental status.  Orders placed for sodium level checks q4h, administration of normal saline infusing and nephrology consult.

## 2015-08-22 LAB — OSMOLALITY, URINE: OSMOLALITY UR: 83 mosm/kg — AB (ref 300–900)

## 2015-08-22 LAB — GLUCOSE, CAPILLARY
GLUCOSE-CAPILLARY: 98 mg/dL (ref 65–99)
GLUCOSE-CAPILLARY: 167 mg/dL — AB (ref 65–99)
Glucose-Capillary: 226 mg/dL — ABNORMAL HIGH (ref 65–99)
Glucose-Capillary: 173 mg/dL — ABNORMAL HIGH (ref 65–99)

## 2015-08-22 LAB — CORTISOL: CORTISOL PLASMA: 6.7 ug/dL

## 2015-08-22 LAB — SODIUM
SODIUM: 126 mmol/L — AB (ref 135–145)
SODIUM: 129 mmol/L — AB (ref 135–145)
Sodium: 127 mmol/L — ABNORMAL LOW (ref 135–145)
Sodium: 127 mmol/L — ABNORMAL LOW (ref 135–145)

## 2015-08-22 LAB — OSMOLALITY: Osmolality: 258 mOsm/kg — ABNORMAL LOW (ref 275–295)

## 2015-08-22 LAB — CREATININE, SERUM: Creatinine, Ser: 0.68 mg/dL (ref 0.44–1.00)

## 2015-08-22 MED ORDER — INSULIN GLARGINE 100 UNIT/ML ~~LOC~~ SOLN
10.0000 [IU] | Freq: Every day | SUBCUTANEOUS | Status: DC
  Filled 2015-08-22: qty 0.1

## 2015-08-22 NOTE — Progress Notes (Signed)
[facility]  ROUNDING NOTE   Subjective:  Serum sodium up to 127 with the use of tolvaptan. Patient much more awake and alert today.    Objective:  Vital signs in last 24 hours:  Temp:  [97.6 F (36.4 C)-97.9 F (36.6 C)] 97.6 F (36.4 C) (08/18 1104) Pulse Rate:  [66-91] 91 (08/18 1104) Resp:  [16-20] 16 (08/18 1104) BP: (102-123)/(40-54) 102/42 (08/18 1104) SpO2:  [87 %-98 %] 91 % (08/18 1104) FiO2 (%):  [30 %] 30 % (08/18 0503) Weight:  [111.2 kg (245 lb 3.2 oz)] 111.2 kg (245 lb 3.2 oz) (08/18 0449)  Weight change: -1.578 kg (-3 lb 7.7 oz) Filed Weights   08/20/15 0500 08/21/15 0510 08/22/15 0449  Weight: 112.9 kg (248 lb 12.8 oz) 112.8 kg (248 lb 10.9 oz) 111.2 kg (245 lb 3.2 oz)    Intake/Output: I/O last 3 completed shifts: In: 1197.5 [P.O.:360; I.V.:337.5; IV Piggyback:500] Out: 601 [Urine:601]   Intake/Output this shift:  Total I/O In: -  Out: 850 [Urine:850]  Physical Exam: General: No acute distress, laying in bed  Head: Normocephalic, atraumatic. Moist oral mucosal membranes  Eyes: Anicteric  Neck: supple  Lungs:  Scattered rhonchi, normal effort  Heart: S1S2 no rubs  Abdomen:  Soft, nontender, BS present  Extremities: trace peripheral edema.  Neurologic: Nonfocal, moving all four extremities  Skin: No lesions       Basic Metabolic Panel:  Recent Labs Lab 08/18/15 0441 08/19/15 0349 08/20/15 0439  08/20/15 1842  08/21/15 0415 08/21/15 0833 08/21/15 1215 08/21/15 1638 08/22/15 0412 08/22/15 1040  NA 128* 128* 123*  < > 118*  < > 121* 121* 120* 122* 126* 127*  K 3.8 4.5 4.5  --  4.4  --  4.3  --   --   --   --   --   CL 78* 78* 74*  --  71*  --  74*  --   --   --   --   --   CO2 42* 45* 36*  --  42*  --  41*  --   --   --   --   --   GLUCOSE 154* 171* 87  --  202*  --  82  --   --   --   --   --   BUN 15 16 15   --  17  --  17  --   --   --   --   --   CREATININE 0.67 0.63 0.74  --  0.69  --  0.64  --   --   --  0.68  --    CALCIUM 7.7* 7.4* 7.1*  --  7.1*  --  6.8*  --   --   --   --   --   < > = values in this interval not displayed.  Liver Function Tests:  Recent Labs Lab 08/15/15 1303  AST 12*  ALT 10*  ALKPHOS 108  BILITOT 0.7  PROT 5.8*  ALBUMIN 3.1*   No results for input(s): LIPASE, AMYLASE in the last 168 hours. No results for input(s): AMMONIA in the last 168 hours.  CBC:  Recent Labs Lab 08/15/15 1303 08/18/15 0441  WBC 7.3 5.7  HGB 10.7* 11.4*  HCT 33.5* 34.4*  MCV 89.5 86.6  PLT 180 192    Cardiac Enzymes:  Recent Labs Lab 08/15/15 1303 08/15/15 1607 08/15/15 2239 08/16/15 0426  TROPONINI 0.03* 0.03* 0.03* 0.04*  BNP: Invalid input(s): POCBNP  CBG:  Recent Labs Lab 08/21/15 0727 08/21/15 1129 08/21/15 1634 08/21/15 2105 08/22/15 0741  GLUCAP 86 118* 194* 158* 98    Microbiology: Results for orders placed or performed during the hospital encounter of 08/15/15  MRSA PCR Screening     Status: None   Collection Time: 08/16/15 11:53 AM  Result Value Ref Range Status   MRSA by PCR NEGATIVE NEGATIVE Final    Comment:        The GeneXpert MRSA Assay (FDA approved for NASAL specimens only), is one component of a comprehensive MRSA colonization surveillance program. It is not intended to diagnose MRSA infection nor to guide or monitor treatment for MRSA infections.     Coagulation Studies: No results for input(s): LABPROT, INR in the last 72 hours.  Urinalysis: No results for input(s): COLORURINE, LABSPEC, PHURINE, GLUCOSEU, HGBUR, BILIRUBINUR, KETONESUR, PROTEINUR, UROBILINOGEN, NITRITE, LEUKOCYTESUR in the last 72 hours.  Invalid input(s): APPERANCEUR    Imaging: No results found.   Medications:     . aspirin EC  81 mg Oral Daily  . clotrimazole  10 mg Oral QID  . DULoxetine  60 mg Oral Daily  . enoxaparin (LOVENOX) injection  40 mg Subcutaneous Q12H  . escitalopram  20 mg Oral Daily  . Ferrous Fumarate  1 tablet Oral Daily  .  gabapentin  400 mg Oral TID  . insulin aspart  0-12 Units Subcutaneous TID AC & HS  . insulin glargine  6 Units Subcutaneous Daily  . ipratropium  2 spray Nasal QID  . magnesium oxide  400 mg Oral Daily  . metoCLOPramide  5 mg Oral TID AC  . metoprolol tartrate  12.5 mg Oral BID  . mometasone-formoterol  2 puff Inhalation BID  . pantoprazole  40 mg Oral BID  . polyethylene glycol  17 g Oral Daily  . pravastatin  40 mg Oral Daily  . senna-docusate  1 tablet Oral BID  . sucralfate  1 g Oral TID WC & HS  . tamsulosin  0.4 mg Oral Daily  . tiotropium  18 mcg Inhalation Daily  . tolvaptan  15 mg Oral Q24H  . traZODone  100 mg Oral QHS  . vitamin B-12  1,000 mcg Oral Daily   acetaminophen, albuterol, benzonatate, ibuprofen, LORazepam, ondansetron (ZOFRAN) IV, phenol, traMADol  Assessment/ Plan:  81 y.o. female with past medical history of breast cancer, COPD, diabetes mellitus type 2, hyperlipidemia, hypertension congestive heart failure ejection fraction 45-50% who was admitted with shortness of breath.  1.  Hyponatremia 2.  Acute systolic heart failure. 3.  Acute respiratory failure requiring periodic bipap  Plan:  Over the past 24 hours the patient has done well with tolvaptan. We will continue the patient on 1 additional day of tolvaptan 15 mg by mouth daily. Continue to monitor serum sodium closely. Urine sodium was noted to be low as was urine osmolality. Urine osmolality likely low now secondary to use of tolvaptan and water diuresis.  It appears that her respiratory status has improved today as well.   LOS: 6 Kathryn Hayes 8/18/201711:18 AM

## 2015-08-22 NOTE — Clinical Social Work Note (Addendum)
MSW updated [facility] that patient is not ready for discharge yet today per physician.  MSW to continue to follow patient's progress throughout discharge planning.  Jones Broom. Norval Morton, MSW [phone]  Mon-Fri 8a-4:30p 08/22/2015 4:57 PM

## 2015-08-22 NOTE — Progress Notes (Signed)
Physical Therapy Treatment Patient Details Name: Kathryn Hayes MRN: [id number] DOB: 1934-04-15 Today's Date: 08/22/2015    History of Present Illness Pt is a 81 y/o admitted for acute exacerbation of CHF. Pt admitted to ED due to progressively worsening SOB and B LE edema. PMH significant for COPD, DM,, and HTN.    PT Comments    Pt in bed and on bed pan upon PT arrival. Reports feeling progressively weaker over the last couple of days and very congested. Pt requires max assist for rolling in bed. Verbal cueing to reach for handrails, max assist to initiate movement, mod-max assist to maintain sidelying position. SLRs, hip abd/add, and heel slides all performed with mod assist, increased weakness noted with L LE. O2 decreased to 87%-89% with supine ther-ex, increased to 92% within ~20 seconds, pursed lip breathing encouraged. Transfers and ambulation not attempted today due to O2 desaturation and SOB with supine ther-ex. Pt will benefit from skilled PT services in order to improve strength, ROM, balance, endurance, functional mobility, and safety with DME use. At this time pt is appropriate for STR to address deficits.    Follow Up Recommendations  SNF     Equipment Recommendations  Rolling walker with 5" wheels    Recommendations for Other Services       Precautions / Restrictions Precautions Precautions: Fall Restrictions Weight Bearing Restrictions: No    Mobility  Bed Mobility Overal bed mobility: Needs Assistance Bed Mobility: Rolling Rolling: Max assist         General bed mobility comments: Rolling assistance to remove bed pan (with nurse tech). Verbal cueing to reach for railings to assist with rolling. Mod-max assist to maintain sidelying position, SOB noted.  Transfers                 General transfer comment: Not attempted due to O2 desat and SOB with supine ther-ex.  Ambulation/Gait             General Gait Details: Not attempted due to O2 desat  and SOB with supine ther-ex.   Stairs            Wheelchair Mobility    Modified Rankin (Stroke Patients Only)       Balance                                    Cognition Arousal/Alertness: Awake/alert Behavior During Therapy: WFL for tasks assessed/performed Overall Cognitive Status: Within Functional Limits for tasks assessed                      Exercises Other Exercises Other Exercises: Supine ther ex: B LE ankle pumps, heel slides, SLRs, quad sets, hip abd/add x 14 reps with mod assist throughout movement, verbal and tactile cueing for proper technique    General Comments        Pertinent Vitals/Pain Pain Assessment: No/denies pain    Home Living                      Prior Function            PT Goals (current goals can now be found in the care plan section) Acute Rehab PT Goals Patient Stated Goal: To feel better PT Goal Formulation: With patient Time For Goal Achievement: 09/01/15 Potential to Achieve Goals: Good Progress towards PT goals: Progressing toward goals    Frequency  Min 2X/week    PT Plan Current plan remains appropriate    Co-evaluation             End of Session Equipment Utilized During Treatment: Oxygen Activity Tolerance: Patient limited by fatigue Patient left: in bed;with call bell/phone within reach;with bed alarm set     Time: 1054-1110 PT Time Calculation (min) (ACUTE ONLY): 16 min  Charges:                       G Codes:      Kathryn Hayes 2015/08/27, 11:47 AM  Kathryn Hayes, SPT [phone]

## 2015-08-22 NOTE — Progress Notes (Signed)
[name] at [facility]                                                                                                                                                                                            Patient Demographics   Kathryn Hayes, is a 81 y.o. female, DOB - 1934-05-13, [id number]  Admit date - 08/15/2015   Admitting Physician Lavonia Drafts, MD  Outpatient Primary MD for the patient is [name], MD   LOS - 6  Subjective Sodium is improved Overnight patient had saturations dropped into the 80s even while on BiPAP He is weak   Review of Systems:   CONSTITUTIONAL: No documented fever. No fatigue,Positive weakness. No weight gain, no weight loss.  EYES: No blurry or double vision.  ENT: No tinnitus. No postnasal drip. No redness of the oropharynx.  RESPIRATORY: No cough, no wheeze, no hemoptysis. Positive dyspnea. Improved CARDIOVASCULAR: No chest pain. No orthopnea. No palpitations. No syncope. Dizziness with standing  GASTROINTESTINAL: No nausea, no vomiting or diarrhea. No abdominal pain. No melena or hematochezia.  GENITOURINARY: No dysuria or hematuria.  ENDOCRINE: No polyuria or nocturia. No heat or cold intolerance.  HEMATOLOGY: No anemia. No bruising. No bleeding.  INTEGUMENTARY: No rashes. No lesions.  MUSCULOSKELETAL: No arthritis. No swelling. No gout.  NEUROLOGIC: No numbness, tingling, or ataxia. No seizure-type activity. Sleepy PSYCHIATRIC: No anxiety. No insomnia. No ADD.    Vitals:   Vitals:   08/22/15 0449 08/22/15 0503 08/22/15 0725 08/22/15 1104  BP: (!) 123/40  (!) 108/54 (!) 102/42  Pulse: 80  66 91  Resp: 20  20 16   Temp: 97.8 F (36.6 C)   97.6 F (36.4 C)  TempSrc:    Oral  SpO2: (!) 87% 98% 94% 91%  Weight: 111.2 kg (245 lb 3.2 oz)     Height:        Wt Readings from Last 3 Encounters:  08/22/15 111.2 kg (245 lb 3.2 oz)  05/16/14 102.5 kg (226 lb)  05/03/14 103.3 kg (227 lb 12.8  oz)     Intake/Output Summary (Last 24 hours) at 08/22/15 1244 Last data filed at 08/22/15 1100  Gross per 24 hour  Intake              360 ml  Output             1800 ml  Net            -1440 ml    Physical Exam:   GENERAL: morbidely obese females.  On by HEAD, EYES, EARS, NOSE AND THROAT: Atraumatic, normocephalic. Extraocular  muscles are intact. Pupils equal and reactive to light. Sclerae anicteric. No conjunctival injection. No oro-pharyngeal erythema.  NECK: Supple. There is no jugular venous distention. No bruits, no lymphadenopathy, no thyromegaly.  HEART: Regular rate and rhythm,. No murmurs, no rubs, no clicks.  LUNGS:  Decreased bs, No rales or rhonchi. No wheezes.  ABDOMEN: Soft, flat, nontender, nondistended. Has good bowel sounds. No hepatosplenomegaly appreciated.  EXTREMITIES: No evidence of any cyanosis, clubbing, or 1+ peripheral edema.  +2 pedal and radial pulses bilaterally.  NEUROLOGIC: The patient is alert, awake, and oriented x3 with no focal motor or sensory deficits appreciated bilaterally.  SKIN: Moist and warm with no rashes appreciated.  Psych: Not anxious, depressed LN: No inguinal LN enlargement    Antibiotics   Anti-infectives    Start     Dose/Rate Route Frequency Ordered Stop   08/16/15 1400  levofloxacin (LEVAQUIN) tablet 500 mg  Status:  Discontinued     500 mg Oral Daily 08/16/15 0846 08/20/15 1528      Medications   Scheduled Meds: . aspirin EC  81 mg Oral Daily  . clotrimazole  10 mg Oral QID  . DULoxetine  60 mg Oral Daily  . enoxaparin (LOVENOX) injection  40 mg Subcutaneous Q12H  . escitalopram  20 mg Oral Daily  . Ferrous Fumarate  1 tablet Oral Daily  . gabapentin  400 mg Oral TID  . insulin aspart  0-12 Units Subcutaneous TID AC & HS  . insulin glargine  6 Units Subcutaneous Daily  . ipratropium  2 spray Nasal QID  . magnesium oxide  400 mg Oral Daily  . metoCLOPramide  5 mg Oral TID AC  . metoprolol tartrate  12.5 mg Oral  BID  . mometasone-formoterol  2 puff Inhalation BID  . pantoprazole  40 mg Oral BID  . polyethylene glycol  17 g Oral Daily  . pravastatin  40 mg Oral Daily  . senna-docusate  1 tablet Oral BID  . sucralfate  1 g Oral TID WC & HS  . tamsulosin  0.4 mg Oral Daily  . tiotropium  18 mcg Inhalation Daily  . tolvaptan  15 mg Oral Q24H  . traZODone  100 mg Oral QHS  . vitamin B-12  1,000 mcg Oral Daily   Continuous Infusions:   PRN Meds:.acetaminophen, albuterol, benzonatate, ibuprofen, LORazepam, ondansetron (ZOFRAN) IV, phenol, traMADol   Data Review:   Micro Results Recent Results (from the past 240 hour(s))  MRSA PCR Screening     Status: None   Collection Time: 08/16/15 11:53 AM  Result Value Ref Range Status   MRSA by PCR NEGATIVE NEGATIVE Final    Comment:        The GeneXpert MRSA Assay (FDA approved for NASAL specimens only), is one component of a comprehensive MRSA colonization surveillance program. It is not intended to diagnose MRSA infection nor to guide or monitor treatment for MRSA infections.     Radiology Reports Dg Chest 1 View  Result Date: 08/20/2015 CLINICAL DATA:  Shortness of breath . EXAM: CHEST 1 VIEW COMPARISON:  08/15/2015. FINDINGS: Cardiac pacer with lead tips in right atrium and right ventricle. Cardiomegaly with normal pulmonary vascularity. Interim improvement bilateral pulmonary interstitial edema and pleural effusions. No pneumothorax. IMPRESSION: 1. Cardiac pacer in stable position.  Stable cardiomegaly. 2. Interim improvement of pulmonary interstitial edema and bilateral pleural effusions. Electronically Signed   By: [place]   On: 08/20/2015 07:35   Dg Chest Portable 1 View  Result  Date: 08/15/2015 CLINICAL DATA:  Shortness of breath. EXAM: PORTABLE CHEST 1 VIEW COMPARISON:  Radiographs of May 21, 2014. FINDINGS: Stable cardiomegaly. Left-sided pacemaker is unchanged in position. Atherosclerosis of thoracic aorta is noted. No  pneumothorax is noted. Increased bibasilar opacities are noted concerning for edema or atelectasis with mild associated pleural effusions. Bony thorax is unremarkable. IMPRESSION: Increased bibasilar opacities are noted concerning for edema or atelectasis with mild associated pleural effusions. Aortic atherosclerosis. Electronically Signed   By: Marijo Conception, M.D.   On: 08/15/2015 13:45     CBC  Recent Labs Lab 08/15/15 1303 08/18/15 0441  WBC 7.3 5.7  HGB 10.7* 11.4*  HCT 33.5* 34.4*  PLT 180 192  MCV 89.5 86.6  MCH 28.6 28.7  MCHC 32.0 33.1  RDW 14.6* 14.1    Chemistries   Recent Labs Lab 08/15/15 1303  08/18/15 0441 08/19/15 0349 08/20/15 0439  08/20/15 1842  08/21/15 0415 08/21/15 0833 08/21/15 1215 08/21/15 1638 08/22/15 0412 08/22/15 1040  NA 128*  < > 128* 128* 123*  < > 118*  < > 121* 121* 120* 122* 126* 127*  K 5.3*  < > 3.8 4.5 4.5  --  4.4  --  4.3  --   --   --   --   --   CL 88*  < > 78* 78* 74*  --  71*  --  74*  --   --   --   --   --   CO2 36*  < > 42* 45* 36*  --  42*  --  41*  --   --   --   --   --   GLUCOSE 181*  < > 154* 171* 87  --  202*  --  82  --   --   --   --   --   BUN 10  < > 15 16 15   --  17  --  17  --   --   --   --   --   CREATININE 0.57  < > 0.67 0.63 0.74  --  0.69  --  0.64  --   --   --  0.68  --   CALCIUM 7.7*  < > 7.7* 7.4* 7.1*  --  7.1*  --  6.8*  --   --   --   --   --   AST 12*  --   --   --   --   --   --   --   --   --   --   --   --   --   ALT 10*  --   --   --   --   --   --   --   --   --   --   --   --   --   ALKPHOS 108  --   --   --   --   --   --   --   --   --   --   --   --   --   BILITOT 0.7  --   --   --   --   --   --   --   --   --   --   --   --   --   < > = values in this interval not displayed. ------------------------------------------------------------------------------------------------------------------ estimated creatinine clearance is 63.4 mL/min (  by C-G formula based on SCr of 0.8  mg/dL). ------------------------------------------------------------------------------------------------------------------ No results for input(s): HGBA1C in the last 72 hours. ------------------------------------------------------------------------------------------------------------------ No results for input(s): CHOL, HDL, LDLCALC, TRIG, CHOLHDL, LDLDIRECT in the last 72 hours. ------------------------------------------------------------------------------------------------------------------  Recent Labs  08/21/15 2227  TSH 3.338   ------------------------------------------------------------------------------------------------------------------ No results for input(s): VITAMINB12, FOLATE, FERRITIN, TIBC, IRON, RETICCTPCT in the last 72 hours.  Coagulation profile No results for input(s): INR, PROTIME in the last 168 hours.  No results for input(s): DDIMER in the last 72 hours.  Cardiac Enzymes  Recent Labs Lab 08/15/15 1607 08/15/15 2239 08/16/15 0426  TROPONINI 0.03* 0.03* 0.04*   ------------------------------------------------------------------------------------------------------------------ Invalid input(s): POCBNP    Assessment & Plan   This is a 81 y.o. female with a history of home O2 dependent COPD, diabetes, hyperlipidemia and hypertension, second-degree heart block status post pacemaker placement now being admitted with: 1. Acute hypercarbic respiratory failure- Due to uncontrolled sleep apnea Continue BiPAP at nighttime Continued therapy for COPD exasperation , nebulizer empiric status post therapy with antibiotics for acute bronchitis  2. Hyponatremia nephrology will be giving further trovaptan 3.  Acute exacerbation of systolic congestive heart failure Lasix on hold for time beiing on Trovaptan 4.Acute urinary retention -Foley catheter, voiding trial once stable  5 Hyperkalemia, mild Potassium currently normal 6. Diabetes type 2 continue sliding scale  insulin Blood glucose elevated add low dose lantus 7. Miscellaneous Lovenox for DVT prophylaxis        Code Status Orders        Start     Ordered   08/15/15 1556  Full code  Continuous     08/15/15 1555    Code Status History    Date Active Date Inactive Code Status Order ID Comments User Context   08/15/2015  3:55 PM 08/16/2015  9:10 AM Full Code [id number]  Harvie Bridge, DO Inpatient    Advance Directive Documentation   Flowsheet Row Most Recent Value  Type of Advance Directive  Living will  Pre-existing out of facility DNR order (yellow form or pink MOST form)  No data  "MOST" Form in Place?  No data           Consults  none  DVT Prophylaxis  Lovenox    Lab Results  Component Value Date   PLT 192 08/18/2015     Time Spent in minutes   7min  Dustin Flock M.D on 08/22/2015 at 12:44 PM  Between 7am to 6pm - Pager - [phone]  After 6pm go to [email or web address] - password [email or web address] [facility]   Office  [phone]

## 2015-08-22 NOTE — Care Management (Signed)
Barrier- requiring tolvaptan for sodium.  Dropping sats still on bipap.  Anticipate discharge to skilled nursing facility 8/21

## 2015-08-22 NOTE — Care Management Important Message (Signed)
Important Message  Patient Details  Name: KARN BARONA MRN: [id number] Date of Birth: 12-08-1934   Medicare Important Message Given:  Yes    Katrina Stack, RN 08/22/2015, 12:09 PM

## 2015-08-23 LAB — GLUCOSE, CAPILLARY
GLUCOSE-CAPILLARY: 220 mg/dL — AB (ref 65–99)
GLUCOSE-CAPILLARY: 149 mg/dL — AB (ref 65–99)
Glucose-Capillary: 105 mg/dL — ABNORMAL HIGH (ref 65–99)
Glucose-Capillary: 83 mg/dL (ref 65–99)

## 2015-08-23 LAB — BASIC METABOLIC PANEL
Anion gap: 4 — ABNORMAL LOW (ref 5–15)
BUN: 11 mg/dL (ref 6–20)
CALCIUM: 7.5 mg/dL — AB (ref 8.9–10.3)
CO2: 42 mmol/L — AB (ref 22–32)
Chloride: 86 mmol/L — ABNORMAL LOW (ref 101–111)
Creatinine, Ser: 0.41 mg/dL — ABNORMAL LOW (ref 0.44–1.00)
GFR calc Af Amer: >60 mL/min (ref >60)
GLUCOSE: 87 mg/dL (ref 65–99)
Potassium: 4.2 mmol/L (ref 3.5–5.1)
Sodium: 132 mmol/L — ABNORMAL LOW (ref 135–145)

## 2015-08-23 LAB — SODIUM: Sodium: 132 mmol/L — ABNORMAL LOW (ref 135–145)

## 2015-08-23 MED ORDER — FUROSEMIDE 20 MG PO TABS
20.0000 mg | ORAL_TABLET | Freq: Every day | ORAL | Status: DC
  Administered 2015-08-23 – 2015-08-25 (×3): 20 mg via ORAL
  Filled 2015-08-23 (×3): qty 1

## 2015-08-23 NOTE — Progress Notes (Signed)
[facility]  ROUNDING NOTE   Subjective:  Serum sodium continues to improve nicely. Serum sodium up to 132 today. Next line she has tolerated tolvaptan quite well. She is awake, alert, and following commands today.  Objective:  Vital signs in last 24 hours:  Temp:  [97.6 F (36.4 C)-98.3 F (36.8 C)] 98.3 F (36.8 C) (08/19 0906) Pulse Rate:  [73-91] 88 (08/19 0906) Resp:  [16-20] 18 (08/19 0445) BP: (90-136)/(29-57) 136/57 (08/19 0906) SpO2:  [91 %-96 %] 94 % (08/19 0906) Weight:  [111.7 kg (246 lb 3.2 oz)] 111.7 kg (246 lb 3.2 oz) (08/19 0445)  Weight change: 0.454 kg (1 lb) Filed Weights   08/21/15 0510 08/22/15 0449 08/23/15 0445  Weight: 112.8 kg (248 lb 10.9 oz) 111.2 kg (245 lb 3.2 oz) 111.7 kg (246 lb 3.2 oz)    Intake/Output: I/O last 3 completed shifts: In: 480 [P.O.:480] Out: 3500 [Urine:3500]   Intake/Output this shift:  Total I/O In: 120 [P.O.:120] Out: 400 [Urine:400]  Physical Exam: General: No acute distress, laying in bed  Head: Normocephalic, atraumatic. Moist oral mucosal membranes  Eyes: Anicteric  Neck: supple  Lungs:  Scattered rhonchi, normal effort  Heart: S1S2 no rubs  Abdomen:  Soft, nontender, BS present  Extremities: trace peripheral edema.  Neurologic: Nonfocal, moving all four extremities  Skin: No lesions       Basic Metabolic Panel:  Recent Labs Lab 08/19/15 0349 08/20/15 0439  08/20/15 1842  08/21/15 0415  08/22/15 0412 08/22/15 1040 08/22/15 1654 08/22/15 2238 08/23/15 0613  NA 128* 123*  < > 118*  < > 121*  < > 126* 127* 127* 129* 132*  K 4.5 4.5  --  4.4  --  4.3  --   --   --   --   --  4.2  CL 78* 74*  --  71*  --  74*  --   --   --   --   --  86*  CO2 45* 36*  --  42*  --  41*  --   --   --   --   --  42*  GLUCOSE 171* 87  --  202*  --  82  --   --   --   --   --  87  BUN 16 15  --  17  --  17  --   --   --   --   --  11  CREATININE 0.63 0.74  --  0.69  --  0.64  --  0.68  --   --   --  0.41*   CALCIUM 7.4* 7.1*  --  7.1*  --  6.8*  --   --   --   --   --  7.5*  < > = values in this interval not displayed.  Liver Function Tests: No results for input(s): AST, ALT, ALKPHOS, BILITOT, PROT, ALBUMIN in the last 168 hours. No results for input(s): LIPASE, AMYLASE in the last 168 hours. No results for input(s): AMMONIA in the last 168 hours.  CBC:  Recent Labs Lab 08/18/15 0441  WBC 5.7  HGB 11.4*  HCT 34.4*  MCV 86.6  PLT 192    Cardiac Enzymes: No results for input(s): CKTOTAL, CKMB, CKMBINDEX, TROPONINI in the last 168 hours.  BNP: Invalid input(s): POCBNP  CBG:  Recent Labs Lab 08/22/15 0741 08/22/15 1103 08/22/15 1606 08/22/15 2054 08/23/15 0724  GLUCAP 98 226* 167* 173* 105*  Microbiology: Results for orders placed or performed during the hospital encounter of 08/15/15  MRSA PCR Screening     Status: None   Collection Time: 08/16/15 11:53 AM  Result Value Ref Range Status   MRSA by PCR NEGATIVE NEGATIVE Final    Comment:        The GeneXpert MRSA Assay (FDA approved for NASAL specimens only), is one component of a comprehensive MRSA colonization surveillance program. It is not intended to diagnose MRSA infection nor to guide or monitor treatment for MRSA infections.     Coagulation Studies: No results for input(s): LABPROT, INR in the last 72 hours.  Urinalysis: No results for input(s): COLORURINE, LABSPEC, PHURINE, GLUCOSEU, HGBUR, BILIRUBINUR, KETONESUR, PROTEINUR, UROBILINOGEN, NITRITE, LEUKOCYTESUR in the last 72 hours.  Invalid input(s): APPERANCEUR    Imaging: No results found.   Medications:     . aspirin EC  81 mg Oral Daily  . clotrimazole  10 mg Oral QID  . DULoxetine  60 mg Oral Daily  . enoxaparin (LOVENOX) injection  40 mg Subcutaneous Q12H  . escitalopram  20 mg Oral Daily  . Ferrous Fumarate  1 tablet Oral Daily  . gabapentin  400 mg Oral TID  . insulin aspart  0-12 Units Subcutaneous TID AC & HS  .  ipratropium  2 spray Nasal QID  . magnesium oxide  400 mg Oral Daily  . metoCLOPramide  5 mg Oral TID AC  . metoprolol tartrate  12.5 mg Oral BID  . mometasone-formoterol  2 puff Inhalation BID  . pantoprazole  40 mg Oral BID  . polyethylene glycol  17 g Oral Daily  . pravastatin  40 mg Oral Daily  . senna-docusate  1 tablet Oral BID  . sucralfate  1 g Oral TID WC & HS  . tamsulosin  0.4 mg Oral Daily  . tiotropium  18 mcg Inhalation Daily  . tolvaptan  15 mg Oral Q24H  . traZODone  100 mg Oral QHS  . vitamin B-12  1,000 mcg Oral Daily   acetaminophen, albuterol, benzonatate, ibuprofen, LORazepam, ondansetron (ZOFRAN) IV, phenol, traMADol  Assessment/ Plan:  81 y.o. female with past medical history of breast cancer, COPD, diabetes mellitus type 2, hyperlipidemia, hypertension congestive heart failure ejection fraction 45-50% who was admitted with shortness of breath.  1.  Hyponatremia 2.  Acute systolic heart failure. 3.  Acute respiratory failure requiring periodic bipap  Plan:  Patient continues to improve. Serum sodium up to 132. This is close to her baseline. We will go ahead and stop tolvaptan 10 today. Transition the patient to Lasix and fluid restriction. Recommend avoiding salt tabs given her underlying heart failure. We recommend follow-up in the office for her underlying hyponatremia.    LOS: 7 Hamilton Marinello 8/19/201710:39 AM

## 2015-08-23 NOTE — Progress Notes (Signed)
[place] at [facility]                                                                                                                                                                                            Patient Demographics   Kathryn Hayes, is a 81 y.o. female, DOB - February 14, 1934, [id number]  Admit date - 08/15/2015   Admitting Physician Lavonia Drafts, MD  Outpatient Primary MD for the patient is [name], MD   LOS - 7  Subjective  His needing BiPAP at night. Saturations have been low at times. Presently on 4 L oxygen. Sodium Improved. Family at bedside.   Review of Systems:   CONSTITUTIONAL: No documented fever. No fatigue,Positive weakness. No weight gain, no weight loss.  EYES: No blurry or double vision.  ENT: No tinnitus. No postnasal drip. No redness of the oropharynx.  RESPIRATORY: Positive for cough and some shortness of breath  CARDIOVASCULAR: No chest pain. No orthopnea. No palpitations. No syncope. Dizziness with standing  GASTROINTESTINAL: No nausea, no vomiting or diarrhea. No abdominal pain. No melena or hematochezia.  GENITOURINARY: No dysuria or hematuria.  ENDOCRINE: No polyuria or nocturia. No heat or cold intolerance.  HEMATOLOGY: No anemia. No bruising. No bleeding.  INTEGUMENTARY: No rashes. No lesions.  MUSCULOSKELETAL: No arthritis. No swelling. No gout.  NEUROLOGIC: No numbness, tingling, or ataxia. No seizure-type activity. Sleepy PSYCHIATRIC: No anxiety. No insomnia. No ADD.    Vitals:   Vitals:   08/23/15 0445 08/23/15 0902 08/23/15 0906 08/23/15 1112  BP: (!) 111/53 (!) 90/29 (!) 136/57 (!) 126/57  Pulse: 73 90 88 89  Resp: 18   16  Temp: 98.2 F (36.8 C)  98.3 F (36.8 C) 97.6 F (36.4 C)  TempSrc: Oral  Oral Oral  SpO2: 94% 96% 94% 96%  Weight: 111.7 kg (246 lb 3.2 oz)     Height:        Wt Readings from Last 3 Encounters:  08/23/15 111.7 kg (246 lb 3.2 oz)  05/16/14 102.5 kg (226  lb)  05/03/14 103.3 kg (227 lb 12.8 oz)     Intake/Output Summary (Last 24 hours) at 08/23/15 1114 Last data filed at 08/23/15 1003  Gross per 24 hour  Intake              600 ml  Output             2100 ml  Net            -1500 ml    Physical Exam:   GENERAL: morbidely obese females.  On by HEAD, EYES,  EARS, NOSE AND THROAT: Atraumatic, normocephalic. Extraocular muscles are intact. Pupils equal and reactive to light. Sclerae anicteric. No conjunctival injection. No oro-pharyngeal erythema.  NECK: Supple. There is no jugular venous distention. No bruits, no lymphadenopathy, no thyromegaly.  HEART: Regular rate and rhythm,. No murmurs, no rubs, no clicks.  LUNGS:  Decreased bs. Bilateral basal crackles ABDOMEN: Soft, flat, nontender, nondistended. Has good bowel sounds. No hepatosplenomegaly appreciated.  EXTREMITIES: No evidence of any cyanosis, clubbing, or 1+ peripheral edema.  +2 pedal and radial pulses bilaterally.  NEUROLOGIC: The patient is alert, awake. Motor strength 4-/5 in lower extremity's SKIN: Moist and warm with no rashes appreciated.  Psych: Not anxious or depressed LN: No inguinal LN enlargement    Antibiotics   Anti-infectives    Start     Dose/Rate Route Frequency Ordered Stop   08/16/15 1400  levofloxacin (LEVAQUIN) tablet 500 mg  Status:  Discontinued     500 mg Oral Daily 08/16/15 0846 08/20/15 1528      Medications   Scheduled Meds: . aspirin EC  81 mg Oral Daily  . clotrimazole  10 mg Oral QID  . DULoxetine  60 mg Oral Daily  . enoxaparin (LOVENOX) injection  40 mg Subcutaneous Q12H  . escitalopram  20 mg Oral Daily  . Ferrous Fumarate  1 tablet Oral Daily  . furosemide  20 mg Oral Daily  . gabapentin  400 mg Oral TID  . insulin aspart  0-12 Units Subcutaneous TID AC & HS  . ipratropium  2 spray Nasal QID  . magnesium oxide  400 mg Oral Daily  . metoCLOPramide  5 mg Oral TID AC  . metoprolol tartrate  12.5 mg Oral BID  .  mometasone-formoterol  2 puff Inhalation BID  . pantoprazole  40 mg Oral BID  . polyethylene glycol  17 g Oral Daily  . pravastatin  40 mg Oral Daily  . senna-docusate  1 tablet Oral BID  . sucralfate  1 g Oral TID WC & HS  . tamsulosin  0.4 mg Oral Daily  . tiotropium  18 mcg Inhalation Daily  . traZODone  100 mg Oral QHS  . vitamin B-12  1,000 mcg Oral Daily   Continuous Infusions:   PRN Meds:.acetaminophen, albuterol, benzonatate, ibuprofen, LORazepam, ondansetron (ZOFRAN) IV, phenol, traMADol   Data Review:   Micro Results Recent Results (from the past 240 hour(s))  MRSA PCR Screening     Status: None   Collection Time: 08/16/15 11:53 AM  Result Value Ref Range Status   MRSA by PCR NEGATIVE NEGATIVE Final    Comment:        The GeneXpert MRSA Assay (FDA approved for NASAL specimens only), is one component of a comprehensive MRSA colonization surveillance program. It is not intended to diagnose MRSA infection nor to guide or monitor treatment for MRSA infections.     Radiology Reports Dg Chest 1 View  Result Date: 08/20/2015 CLINICAL DATA:  Shortness of breath . EXAM: CHEST 1 VIEW COMPARISON:  08/15/2015. FINDINGS: Cardiac pacer with lead tips in right atrium and right ventricle. Cardiomegaly with normal pulmonary vascularity. Interim improvement bilateral pulmonary interstitial edema and pleural effusions. No pneumothorax. IMPRESSION: 1. Cardiac pacer in stable position.  Stable cardiomegaly. 2. Interim improvement of pulmonary interstitial edema and bilateral pleural effusions. Electronically Signed   By: Marcello Moores  Register   On: 08/20/2015 07:35   Dg Chest Portable 1 View  Result Date: 08/15/2015 CLINICAL DATA:  Shortness of breath. EXAM: PORTABLE CHEST 1  VIEW COMPARISON:  Radiographs of May 21, 2014. FINDINGS: Stable cardiomegaly. Left-sided pacemaker is unchanged in position. Atherosclerosis of thoracic aorta is noted. No pneumothorax is noted. Increased bibasilar  opacities are noted concerning for edema or atelectasis with mild associated pleural effusions. Bony thorax is unremarkable. IMPRESSION: Increased bibasilar opacities are noted concerning for edema or atelectasis with mild associated pleural effusions. Aortic atherosclerosis. Electronically Signed   By: Marijo Conception, M.D.   On: 08/15/2015 13:45     CBC  Recent Labs Lab 08/18/15 0441  WBC 5.7  HGB 11.4*  HCT 34.4*  PLT 192  MCV 86.6  MCH 28.7  MCHC 33.1  RDW 14.1    Chemistries   Recent Labs Lab 08/19/15 0349 08/20/15 0439  08/20/15 1842  08/21/15 0415  08/22/15 0412 08/22/15 1040 08/22/15 1654 08/22/15 2238 08/23/15 0613  NA 128* 123*  < > 118*  < > 121*  < > 126* 127* 127* 129* 132*  K 4.5 4.5  --  4.4  --  4.3  --   --   --   --   --  4.2  CL 78* 74*  --  71*  --  74*  --   --   --   --   --  86*  CO2 45* 36*  --  42*  --  41*  --   --   --   --   --  42*  GLUCOSE 171* 87  --  202*  --  82  --   --   --   --   --  87  BUN 16 15  --  17  --  17  --   --   --   --   --  11  CREATININE 0.63 0.74  --  0.69  --  0.64  --  0.68  --   --   --  0.41*  CALCIUM 7.4* 7.1*  --  7.1*  --  6.8*  --   --   --   --   --  7.5*  < > = values in this interval not displayed. ------------------------------------------------------------------------------------------------------------------ estimated creatinine clearance is 63.6 mL/min (by C-G formula based on SCr of 0.8 mg/dL). ------------------------------------------------------------------------------------------------------------------ No results for input(s): HGBA1C in the last 72 hours. ------------------------------------------------------------------------------------------------------------------ No results for input(s): CHOL, HDL, LDLCALC, TRIG, CHOLHDL, LDLDIRECT in the last 72 hours. ------------------------------------------------------------------------------------------------------------------  Recent Labs   08/21/15 2227  TSH 3.338   ------------------------------------------------------------------------------------------------------------------ No results for input(s): VITAMINB12, FOLATE, FERRITIN, TIBC, IRON, RETICCTPCT in the last 72 hours.  Coagulation profile No results for input(s): INR, PROTIME in the last 168 hours.  No results for input(s): DDIMER in the last 72 hours.  Cardiac Enzymes No results for input(s): CKMB, TROPONINI, MYOGLOBIN in the last 168 hours.  Invalid input(s): CK ------------------------------------------------------------------------------------------------------------------ Invalid input(s): [place]   This is a 81 y.o. female with a history of home O2 dependent COPD, diabetes, hyperlipidemia and hypertension, second-degree heart block status post pacemaker placement now being admitted with  1. Acute hypercarbic, hypoxic respiratory failure- Due to uncontrolled sleep apnea and congestive heart Continue BiPAP at nighttime Continued therapy for COPD exacerbation, nebulizer empiric status post therapy with antibiotics for acute bronchitis Still on high oxygen   2. Hyponatremia - Discussed with Dr. Holley Raring Tolvaptan stopped Fluid restriction and start low-dose Lasix  3.  Acute On chronic systolic congestive heart failure Diuresed well with Tolvaptan. Discontinue. Restart low-dose Lasix. Fluid  restriction.  4.Acute urinary retention -Foley catheter, voiding trial As outpatient   5 Hyperkalemia, mild - resolved Potassium currently normal  6. Diabetes type 2 continue sliding scale insulin Discontinue Lantus as early morning sugars are normal.  7. Lovenox for DVT prophylaxis        Code Status Orders        Start     Ordered   08/15/15 1556  Full code  Continuous     08/15/15 1555    Code Status History    Date Active Date Inactive Code Status Order ID Comments User Context   08/15/2015  3:55 PM 08/16/2015  9:10 AM  Full Code [id number]  Harvie Bridge, DO Inpatient    Advance Directive Documentation   Flowsheet Row Most Recent Value  Type of Advance Directive  Living will  Pre-existing out of facility DNR order (yellow form or pink MOST form)  No data  "MOST" Form in Place?  No data      Consults  none  DVT Prophylaxis  Lovenox    Lab Results  Component Value Date   PLT 192 08/18/2015     Time Spent in minutes   31min  Hillary Bow R M.D on 08/23/2015 at 11:14 AM  Between 7am to 6pm - Pager - [phone]  After 6pm go to [email or web address] - password [email or web address] [facility]   Office  [phone]

## 2015-08-24 LAB — GLUCOSE, CAPILLARY
GLUCOSE-CAPILLARY: 110 mg/dL — AB (ref 65–99)
GLUCOSE-CAPILLARY: 159 mg/dL — AB (ref 65–99)
GLUCOSE-CAPILLARY: 172 mg/dL — AB (ref 65–99)
Glucose-Capillary: 118 mg/dL — ABNORMAL HIGH (ref 65–99)

## 2015-08-24 LAB — BASIC METABOLIC PANEL
ANION GAP: 3 — AB (ref 5–15)
BUN: 9 mg/dL (ref 6–20)
CALCIUM: 7.8 mg/dL — AB (ref 8.9–10.3)
CO2: 43 mmol/L — AB (ref 22–32)
Chloride: 90 mmol/L — ABNORMAL LOW (ref 101–111)
Creatinine, Ser: 0.47 mg/dL (ref 0.44–1.00)
GFR calc non Af Amer: >60 mL/min (ref >60)
Glucose, Bld: 114 mg/dL — ABNORMAL HIGH (ref 65–99)
Potassium: 4.7 mmol/L (ref 3.5–5.1)
SODIUM: 136 mmol/L (ref 135–145)

## 2015-08-24 NOTE — Progress Notes (Signed)
Asleep continues on BiPAP.  Had to awaken her for vital signs, immediately fell back to sleep.

## 2015-08-24 NOTE — Progress Notes (Signed)
[place] at [facility]                                                                                                                                                                                            Patient Demographics   Kathryn Hayes, is a 81 y.o. female, DOB - [birth year], [id number]  Admit date - 08/15/2015   Admitting Physician Lavonia Drafts, MD  Outpatient Primary MD for the patient is Kathryn Lovie Macadamia, MD   LOS - 8  Subjective   needing BiPAP at night. Saturations have been low at times. Presently on 4 L oxygen. Feels weak   Review of Systems:   CONSTITUTIONAL: No documented fever. No fatigue,Positive weakness. No weight gain, no weight loss.  EYES: No blurry or double vision.  ENT: No tinnitus. No postnasal drip. No redness of the oropharynx.  RESPIRATORY: Positive for cough and some shortness of breath  CARDIOVASCULAR: No chest pain. No orthopnea. No palpitations. No syncope. Dizziness with standing  GASTROINTESTINAL: No nausea, no vomiting or diarrhea. No abdominal pain. No melena or hematochezia.  GENITOURINARY: No dysuria or hematuria.  ENDOCRINE: No polyuria or nocturia. No heat or cold intolerance.  HEMATOLOGY: No anemia. No bruising. No bleeding.  INTEGUMENTARY: No rashes. No lesions.  MUSCULOSKELETAL: No arthritis. No swelling. No gout.  NEUROLOGIC: No numbness, tingling, or ataxia. No seizure-type activity. Sleepy PSYCHIATRIC: No anxiety. No insomnia. No ADD.    Vitals:   Vitals:   08/24/15 0738 08/24/15 0741 08/24/15 0744 08/24/15 1146  BP: (!) 128/54 (!) 138/58 (!) 126/56 (!) 129/54  Pulse: 74 74 74 76  Resp: 19   (!) 22  Temp: 98.3 F (36.8 C)   97.9 F (36.6 C)  TempSrc: Oral   Oral  SpO2: 97%   96%  Weight:      Height:        Wt Readings from Last 3 Encounters:  08/24/15 110.5 kg (243 lb 11.2 oz)  05/16/14 102.5 kg (226 lb)  05/03/14 103.3 kg (227 lb 12.8 oz)     Intake/Output Summary  (Last 24 hours) at 08/24/15 1149 Last data filed at 08/24/15 0900  Gross per 24 hour  Intake             1000 ml  Output             1000 ml  Net                0 ml    Physical Exam:   GENERAL: morbidely obese females.  On by HEAD, EYES, EARS, NOSE AND THROAT: Atraumatic, normocephalic. Extraocular  muscles are intact. Pupils equal and reactive to light. Sclerae anicteric. No conjunctival injection. No oro-pharyngeal erythema.  NECK: Supple. There is no jugular venous distention. No bruits, no lymphadenopathy, no thyromegaly.  HEART: Regular rate and rhythm,. No murmurs, no rubs, no clicks.  LUNGS:  Decreased bs. Bilateral basal crackles ABDOMEN: Soft, flat, nontender, nondistended. Has good bowel sounds. No hepatosplenomegaly appreciated.  EXTREMITIES: No evidence of any cyanosis, clubbing, or 1+ peripheral edema.  +2 pedal and radial pulses bilaterally.  NEUROLOGIC: The patient is alert, awake. Motor strength 4-/5 in lower extremity's SKIN: Moist and warm with no rashes appreciated.  Psych: Not anxious or depressed LN: No inguinal LN enlargement    Antibiotics   Anti-infectives    Start     Dose/Rate Route Frequency Ordered Stop   08/16/15 1400  levofloxacin (LEVAQUIN) tablet 500 mg  Status:  Discontinued     500 mg Oral Daily 08/16/15 0846 08/20/15 1528      Medications   Scheduled Meds: . aspirin EC  81 mg Oral Daily  . clotrimazole  10 mg Oral QID  . DULoxetine  60 mg Oral Daily  . enoxaparin (LOVENOX) injection  40 mg Subcutaneous Q12H  . escitalopram  20 mg Oral Daily  . Ferrous Fumarate  1 tablet Oral Daily  . furosemide  20 mg Oral Daily  . gabapentin  400 mg Oral TID  . insulin aspart  0-12 Units Subcutaneous TID AC & HS  . ipratropium  2 spray Nasal QID  . magnesium oxide  400 mg Oral Daily  . metoCLOPramide  5 mg Oral TID AC  . metoprolol tartrate  12.5 mg Oral BID  . mometasone-formoterol  2 puff Inhalation BID  . pantoprazole  40 mg Oral BID  .  polyethylene glycol  17 g Oral Daily  . pravastatin  40 mg Oral Daily  . senna-docusate  1 tablet Oral BID  . sucralfate  1 g Oral TID WC & HS  . tamsulosin  0.4 mg Oral Daily  . tiotropium  18 mcg Inhalation Daily  . traZODone  100 mg Oral QHS  . vitamin B-12  1,000 mcg Oral Daily   Continuous Infusions:   PRN Meds:.acetaminophen, albuterol, benzonatate, ibuprofen, LORazepam, ondansetron (ZOFRAN) IV, phenol, traMADol   Data Review:   Micro Results Recent Results (from the past 240 hour(s))  MRSA PCR Screening     Status: None   Collection Time: 08/16/15 11:53 AM  Result Value Ref Range Status   MRSA by PCR NEGATIVE NEGATIVE Final    Comment:        The GeneXpert MRSA Assay (FDA approved for NASAL specimens only), is one component of a comprehensive MRSA colonization surveillance program. It is not intended to diagnose MRSA infection nor to guide or monitor treatment for MRSA infections.     Radiology Reports Dg Chest 1 View  Result Date: 08/20/2015 CLINICAL DATA:  Shortness of breath . EXAM: CHEST 1 VIEW COMPARISON:  08/15/2015. FINDINGS: Cardiac pacer with lead tips in right atrium and right ventricle. Cardiomegaly with normal pulmonary vascularity. Interim improvement bilateral pulmonary interstitial edema and pleural effusions. No pneumothorax. IMPRESSION: 1. Cardiac pacer in stable position.  Stable cardiomegaly. 2. Interim improvement of pulmonary interstitial edema and bilateral pleural effusions. Electronically Signed   By: Marcello Moores  Register   On: 08/20/2015 07:35   Dg Chest Portable 1 View  Result Date: 08/15/2015 CLINICAL DATA:  Shortness of breath. EXAM: PORTABLE CHEST 1 VIEW COMPARISON:  Radiographs of May 21, 2014. FINDINGS: Stable cardiomegaly. Left-sided pacemaker is unchanged in position. Atherosclerosis of thoracic aorta is noted. No pneumothorax is noted. Increased bibasilar opacities are noted concerning for edema or atelectasis with mild associated pleural  effusions. Bony thorax is unremarkable. IMPRESSION: Increased bibasilar opacities are noted concerning for edema or atelectasis with mild associated pleural effusions. Aortic atherosclerosis. Electronically Signed   By: Marijo Conception, M.D.   On: 08/15/2015 13:45     CBC  Recent Labs Lab 08/18/15 0441  WBC 5.7  HGB 11.4*  HCT 34.4*  PLT 192  MCV 86.6  MCH 28.7  MCHC 33.1  RDW 14.1    Chemistries   Recent Labs Lab 08/20/15 0439  08/20/15 1842  08/21/15 0415  08/22/15 0412  08/22/15 1654 08/22/15 2238 08/23/15 0613 08/23/15 1041 08/24/15 0518  NA 123*  < > 118*  < > 121*  < > 126*  < > 127* 129* 132* 132* 136  K 4.5  --  4.4  --  4.3  --   --   --   --   --  4.2  --  4.7  CL 74*  --  71*  --  74*  --   --   --   --   --  86*  --  90*  CO2 36*  --  42*  --  41*  --   --   --   --   --  42*  --  43*  GLUCOSE 87  --  202*  --  82  --   --   --   --   --  87  --  114*  BUN 15  --  17  --  17  --   --   --   --   --  11  --  9  CREATININE 0.74  --  0.69  --  0.64  --  0.68  --   --   --  0.41*  --  0.47  CALCIUM 7.1*  --  7.1*  --  6.8*  --   --   --   --   --  7.5*  --  7.8*  < > = values in this interval not displayed. ------------------------------------------------------------------------------------------------------------------ estimated creatinine clearance is 63.1 mL/min (by C-G formula based on SCr of 0.8 mg/dL). ------------------------------------------------------------------------------------------------------------------ No results for input(s): HGBA1C in the last 72 hours. ------------------------------------------------------------------------------------------------------------------ No results for input(s): CHOL, HDL, LDLCALC, TRIG, CHOLHDL, LDLDIRECT in the last 72 hours. ------------------------------------------------------------------------------------------------------------------  Recent Labs  08/21/15 2227  TSH 3.338    ------------------------------------------------------------------------------------------------------------------ No results for input(s): VITAMINB12, FOLATE, FERRITIN, TIBC, IRON, RETICCTPCT in the last 72 hours.  Coagulation profile No results for input(s): INR, PROTIME in the last 168 hours.  No results for input(s): DDIMER in the last 72 hours.  Cardiac Enzymes No results for input(s): CKMB, TROPONINI, MYOGLOBIN in the last 168 hours.  Invalid input(s): CK ------------------------------------------------------------------------------------------------------------------ Invalid input(s): [place]   This is a 81 y.o. female with a history of home O2 dependent COPD, diabetes, hyperlipidemia and hypertension, second-degree heart block status post pacemaker placement now being admitted with  1. Acute hypercarbic, hypoxic respiratory failure- Due to uncontrolled sleep apnea and congestive heart Continue BiPAP at nighttime Continued therapy for COPD exacerbation, nebulizer empiric status post therapy with antibiotics for acute bronchitis Still on oxygen   2. Hyponatremia - Discussed with Dr. Holley Raring - resolved Tolvaptan stopped Fluid restriction and start low-dose Lasix  3.  Acute On chronic systolic congestive heart failure Diuresed well with Tolvaptan. Discontinued 08/23/2015  low-dose Lasix. Fluid restriction.  4.Acute urinary retention Foley catheter, voiding trial As outpatient   5 Hyperkalemia, mild - resolved Potassium currently normal  6. Diabetes type 2  sliding scale insulin   7. Lovenox for DVT prophylaxis        Code Status Orders        Start     Ordered   08/15/15 1556  Full code  Continuous     08/15/15 1555    Code Status History    Date Active Date Inactive Code Status Order ID Comments User Context   08/15/2015  3:55 PM 08/16/2015  9:10 AM Full Code [id number]  Harvie Bridge, DO Inpatient    Advance Directive  Documentation   Flowsheet Row Most Recent Value  Type of Advance Directive  Living will  Pre-existing out of facility DNR order (yellow form or pink MOST form)  No data  "MOST" Form in Place?  No data      Consults  none  DVT Prophylaxis  Lovenox    Lab Results  Component Value Date   PLT 192 08/18/2015     Time Spent in minutes   19min  Hillary Bow R M.D on 08/24/2015 at 11:49 AM  Between 7am to 6pm - Pager - [phone]  After 6pm go to [email or web address] - password [email or web address] [facility]   Office  [phone]

## 2015-08-24 NOTE — Progress Notes (Signed)
[facility]  ROUNDING NOTE   Subjective:  Sodium has now normalized. Serum sodium today is 136. Patient resting comfortably in bed. She will likely be admitted to a rehabilitation facility. Good urine output of 1.6 L yesterday.  Objective:  Vital signs in last 24 hours:  Temp:  [97.6 F (36.4 C)-98.6 F (37 C)] 98.3 F (36.8 C) (08/20 0738) Pulse Rate:  [74-99] 74 (08/20 0744) Resp:  [16-20] 19 (08/20 0738) BP: (126-144)/(53-58) 126/56 (08/20 0744) SpO2:  [94 %-97 %] 97 % (08/20 0738) Weight:  [110.5 kg (243 lb 11.2 oz)] 110.5 kg (243 lb 11.2 oz) (08/20 0500)  Weight change: -1.134 kg (-2 lb 8 oz) Filed Weights   08/22/15 0449 08/23/15 0445 08/24/15 0500  Weight: 111.2 kg (245 lb 3.2 oz) 111.7 kg (246 lb 3.2 oz) 110.5 kg (243 lb 11.2 oz)    Intake/Output: I/O last 3 completed shifts: In: 1000 [P.O.:1000] Out: 3300 [Urine:3300]   Intake/Output this shift:  Total I/O In: 120 [P.O.:120] Out: -   Physical Exam: General: No acute distress, laying in bed  Head: Normocephalic, atraumatic. Moist oral mucosal membranes  Eyes: Anicteric  Neck: supple  Lungs:  Scattered rhonchi, normal effort  Heart: S1S2 no rubs  Abdomen:  Soft, nontender, BS present  Extremities: trace peripheral edema.  Neurologic: Nonfocal, moving all four extremities  Skin: No lesions       Basic Metabolic Panel:  Recent Labs Lab 08/20/15 0439  08/20/15 1842  08/21/15 0415  08/22/15 0412  08/22/15 1654 08/22/15 2238 08/23/15 0613 08/23/15 1041 08/24/15 0518  NA 123*  < > 118*  < > 121*  < > 126*  < > 127* 129* 132* 132* 136  K 4.5  --  4.4  --  4.3  --   --   --   --   --  4.2  --  4.7  CL 74*  --  71*  --  74*  --   --   --   --   --  86*  --  90*  CO2 36*  --  42*  --  41*  --   --   --   --   --  42*  --  43*  GLUCOSE 87  --  202*  --  82  --   --   --   --   --  87  --  114*  BUN 15  --  17  --  17  --   --   --   --   --  11  --  9  CREATININE 0.74  --  0.69  --   0.64  --  0.68  --   --   --  0.41*  --  0.47  CALCIUM 7.1*  --  7.1*  --  6.8*  --   --   --   --   --  7.5*  --  7.8*  < > = values in this interval not displayed.  Liver Function Tests: No results for input(s): AST, ALT, ALKPHOS, BILITOT, PROT, ALBUMIN in the last 168 hours. No results for input(s): LIPASE, AMYLASE in the last 168 hours. No results for input(s): AMMONIA in the last 168 hours.  CBC:  Recent Labs Lab 08/18/15 0441  WBC 5.7  HGB 11.4*  HCT 34.4*  MCV 86.6  PLT 192    Cardiac Enzymes: No results for input(s): CKTOTAL, CKMB, CKMBINDEX, TROPONINI in the last 168 hours.  BNP: Invalid input(s): POCBNP  CBG:  Recent Labs Lab 08/23/15 0724 08/23/15 1114 08/23/15 1607 08/23/15 2112 08/24/15 0744  GLUCAP 105* 83 220* 149* 110*    Microbiology: Results for orders placed or performed during the hospital encounter of 08/15/15  MRSA PCR Screening     Status: None   Collection Time: 08/16/15 11:53 AM  Result Value Ref Range Status   MRSA by PCR NEGATIVE NEGATIVE Final    Comment:        The GeneXpert MRSA Assay (FDA approved for NASAL specimens only), is one component of a comprehensive MRSA colonization surveillance program. It is not intended to diagnose MRSA infection nor to guide or monitor treatment for MRSA infections.     Coagulation Studies: No results for input(s): LABPROT, INR in the last 72 hours.  Urinalysis: No results for input(s): COLORURINE, LABSPEC, PHURINE, GLUCOSEU, HGBUR, BILIRUBINUR, KETONESUR, PROTEINUR, UROBILINOGEN, NITRITE, LEUKOCYTESUR in the last 72 hours.  Invalid input(s): APPERANCEUR    Imaging: No results found.   Medications:     . aspirin EC  81 mg Oral Daily  . clotrimazole  10 mg Oral QID  . DULoxetine  60 mg Oral Daily  . enoxaparin (LOVENOX) injection  40 mg Subcutaneous Q12H  . escitalopram  20 mg Oral Daily  . Ferrous Fumarate  1 tablet Oral Daily  . furosemide  20 mg Oral Daily  . gabapentin   400 mg Oral TID  . insulin aspart  0-12 Units Subcutaneous TID AC & HS  . ipratropium  2 spray Nasal QID  . magnesium oxide  400 mg Oral Daily  . metoCLOPramide  5 mg Oral TID AC  . metoprolol tartrate  12.5 mg Oral BID  . mometasone-formoterol  2 puff Inhalation BID  . pantoprazole  40 mg Oral BID  . polyethylene glycol  17 g Oral Daily  . pravastatin  40 mg Oral Daily  . senna-docusate  1 tablet Oral BID  . sucralfate  1 g Oral TID WC & HS  . tamsulosin  0.4 mg Oral Daily  . tiotropium  18 mcg Inhalation Daily  . traZODone  100 mg Oral QHS  . vitamin B-12  1,000 mcg Oral Daily   acetaminophen, albuterol, benzonatate, ibuprofen, LORazepam, ondansetron (ZOFRAN) IV, phenol, traMADol  Assessment/ Plan:  81 y.o. female with past medical history of breast cancer, COPD, diabetes mellitus type 2, hyperlipidemia, hypertension congestive heart failure ejection fraction 45-50% who was admitted with shortness of breath.  1.  Hyponatremia, likely due to heart failure, responded to therapy with tolvaptan 2.  Acute systolic heart failure. 3.  Acute respiratory failure requiring periodic bipap  Plan:  Patient seen at bedside. She has improved quite significantly with use of tolvaptan. This was discontinued yesterday. Serum sodium has now normalized at 136. Continue fluid restriction of 1200 mL per day as well as Lasix 20 mg by mouth daily. Avoid salt tablets in this situation given underlying heart failure.    LOS: 8 Kathryn Hayes 8/20/201711:08 AM

## 2015-08-25 LAB — GLUCOSE, CAPILLARY
GLUCOSE-CAPILLARY: 180 mg/dL — AB (ref 65–99)
Glucose-Capillary: 123 mg/dL — ABNORMAL HIGH (ref 65–99)

## 2015-08-25 MED ORDER — LORAZEPAM 1 MG PO TABS
1.0000 mg | ORAL_TABLET | Freq: Two times a day (BID) | ORAL | 0 refills | Status: DC | PRN
Start: 2015-08-25 — End: 2015-09-25

## 2015-08-25 MED ORDER — TRAZODONE HCL 100 MG PO TABS: 100.0000 mg | ORAL_TABLET | Freq: Every day | ORAL | Status: DC

## 2015-08-25 MED ORDER — TAMSULOSIN HCL 0.4 MG PO CAPS: 0.4000 mg | ORAL_CAPSULE | Freq: Every day | ORAL | 0 refills | Status: DC

## 2015-08-25 MED ORDER — TRAMADOL HCL 50 MG PO TABS: 50.0000 mg | ORAL_TABLET | Freq: Four times a day (QID) | ORAL | 0 refills | Status: DC | PRN

## 2015-08-25 NOTE — Discharge Summary (Addendum)
[facility] [place] NAME: Kathryn Hayes    MR#:  [id number]  DATE OF BIRTH:  [birth year]  DATE OF ADMISSION:  08/15/2015 ADMITTING PHYSICIAN: Lavonia Drafts, MD  DATE OF DISCHARGE: 08/25/2015  PRIMARY CARE PHYSICIAN: Juluis Pitch, MD   ADMISSION DIAGNOSIS:  Acute on chronic congestive heart failure, unspecified congestive heart failure type ([place]) [I50.9]  DISCHARGE DIAGNOSIS:  Active Problems:   Acute exacerbation of CHF (congestive heart failure) (HCC)   CHF (congestive heart failure) ([place])   SECONDARY DIAGNOSIS:   Past Medical History:  Diagnosis Date  . Breast cancer ([place])   . Chronic headache   . COPD (chronic obstructive pulmonary disease) ([place])   . Diabetes mellitus   . Hyperlipidemia   . Hypertension      ADMITTING HISTORY  HISTORY OF PRESENT ILLNESS: Kathryn Hayes is a 81 y.o. female with a known history of Home O2 dependent COPD, second degree heart block s/p PPM, diabetes, hypertension hyperlipidemia was in a usual state of health until yesterday when she began experiencing progressively worsening shortness of breath associated with lower extremity edema.  Her shortness of breath is associated with cough productive of clear to white sputum  She states that she may have had excess salt in her diet recently but cannot identify any other changes.  Functionally she is independent, usually walks around her home but has had increasing dyspnea on minimal exertion.  She denies any chest pain, recent fevers or chills.  Of note her son reports that yesterday she had difficulty urinating. She added that she had several instances where she attempted to urinate but only a few drops came out.  Otherwise there has been no change in status. Patient has been taking medication as prescribed and there has been no recent change in medication or diet.  There has been no recent illness, travel or sick contacts.    Patient denies  fevers/chills, weakness, dizziness, chest pain, N/V/C/D, abdominal pain, dysuria/frequency, changes in mental status.   HOSPITAL COURSE:   This is a 81 y.o.femalewith a history of home O2 dependent COPD, diabetes, hyperlipidemia and hypertension, second-degree heart block status post pacemaker placement admitted with  1. Acute hypercarbic, hypoxic respiratory failure- Due to uncontrolled sleep apnea and congestive heart - resolved Continued BiPAP at nighttime in the hospital Continued therapy for COPD exacerbation, nebulizer empiric status post therapy with antibiotics for acute bronchitis Still on oxygen at 3-4 L/min which is her baseline. Needs CPAP at night after discharge.  Automated CPAP with Low pressure 5 and High pressure 20.  Pulmonary f/u after discharge   2. Hyponatremia  - resolved - due to CHF Tolvaptan stopped Fluid restriction and started low-dose Lasix at home dose of 20mg /day  3.  Acute On chronic systolic congestive heart failure Diuresed well with Tolvaptan. Discontinued 08/23/2015  low-dose Lasix.   4.Acute urinary retention Foley catheter was placed. Removed later. Now able to void without any issues   5 Hyperkalemia, mild - resolved Potassium currently normal  6. Diabetes type 2  Diet controlled  7. Lovenox for DVT prophylaxis  Stable for discharge to SNF for PT  CONSULTS OBTAINED:  Treatment Team:  Teodoro Spray, MD Anthonette Legato, MD  DRUG ALLERGIES:  No Known Allergies  DISCHARGE MEDICATIONS:   Current Discharge Medication List    START taking these medications   Details  tamsulosin (FLOMAX) 0.4 MG CAPS capsule Take 1 capsule (0.4 mg total) by mouth daily. Qty: 30 capsule,  Refills: 0      CONTINUE these medications which have CHANGED   Details  LORazepam (ATIVAN) 1 MG tablet Take 1 tablet (1 mg total) by mouth 2 (two) times daily as needed for anxiety or sleep. Qty: 10 tablet, Refills: 0    traMADol (ULTRAM) 50 MG tablet  Take 1 tablet (50 mg total) by mouth 4 (four) times daily as needed for moderate pain. Qty: 20 tablet, Refills: 0    traZODone (DESYREL) 100 MG tablet Take 1 tablet (100 mg total) by mouth at bedtime.      CONTINUE these medications which have NOT CHANGED   Details  albuterol (PROVENTIL) (2.5 MG/3ML) 0.083% nebulizer solution Take 2.5 mg by nebulization every 4 (four) hours as needed.    aspirin 81 MG tablet Take 81 mg by mouth daily.    clotrimazole (MYCELEX) 10 MG troche Take 10 mg by mouth 4 (four) times daily.    docusate sodium (COLACE) 100 MG capsule Take 100 mg by mouth 2 (two) times daily.    DULoxetine (CYMBALTA) 60 MG capsule Take 60 mg by mouth daily.    escitalopram (LEXAPRO) 20 MG tablet Take 20 mg by mouth daily.    ferrous fumarate (HEMOCYTE - 106 MG FE) 325 (106 FE) MG TABS tablet Take 1 tablet by mouth daily.    Fluticasone-Salmeterol (ADVAIR) 250-50 MCG/DOSE AEPB Inhale 1 puff into the lungs 2 (two) times daily.    furosemide (LASIX) 20 MG tablet Take 20 mg by mouth daily.    gabapentin (NEURONTIN) 400 MG capsule Take 400 mg by mouth 3 (three) times daily.    Hydrocortisone (GERHARDT'S BUTT CREAM) CREA Apply 1 application topically 2 (two) times daily.    magnesium oxide (MAG-OX) 400 MG tablet Take 400 mg by mouth daily.    metoCLOPramide (REGLAN) 5 MG tablet Take 5 mg by mouth 3 (three) times daily before meals.    metoprolol tartrate (LOPRESSOR) 25 MG tablet Take 25 mg by mouth 2 (two) times daily.    nystatin (NYSTATIN) powder Apply topically 2 (two) times daily.    pantoprazole (PROTONIX) 40 MG tablet Take 40 mg by mouth 2 (two) times daily.     pravastatin (PRAVACHOL) 40 MG tablet Take 40 mg by mouth at bedtime.     sucralfate (CARAFATE) 1 G tablet Take 1 g by mouth 4 (four) times daily -  with meals and at bedtime.    tiotropium (SPIRIVA) 18 MCG inhalation capsule Place 1 capsule (18 mcg total) into inhaler and inhale daily. Qty: 30 capsule,  Refills: 5    vitamin B-12 (CYANOCOBALAMIN) 1000 MCG tablet Take 1,000 mcg by mouth daily.    OXYGEN Inhale 3-4 L into the lungs. At all times for COPD    Respiratory Therapy Supplies (FLUTTER) DEVI Use as directed Qty: 1 each, Refills: 0      STOP taking these medications     potassium chloride (K-DUR) 10 MEQ tablet      benzonatate (TESSALON) 100 MG capsule      ipratropium (ATROVENT) 0.03 % nasal spray         Today   VITAL SIGNS:  Blood pressure (!) 127/45, pulse 74, temperature 98.1 F (36.7 C), temperature source Oral, resp. rate 17, height 4\' 11"  (1.499 m), weight 110.5 kg (243 lb 8 oz), SpO2 94 %.  I/O:   Intake/Output Summary (Last 24 hours) at 08/25/15 0950 Last data filed at 08/24/15 2250  Gross per 24 hour  Intake  480 ml  Output             1000 ml  Net             -520 ml    PHYSICAL EXAMINATION:  Physical Exam  GENERAL:  81 y.o.-year-old patient lying in the bed with no acute distress. Obese LUNGS: Normal breath sounds bilaterally, no wheezing, rales,rhonchi or crepitation. No use of accessory muscles of respiration.  CARDIOVASCULAR: S1, S2 normal. No murmurs, rubs, or gallops.  ABDOMEN: Soft, non-tender, non-distended. Bowel sounds present. No organomegaly or mass.  NEUROLOGIC: Moves all 4 extremities. PSYCHIATRIC: The patient is alert and awake SKIN: No obvious rash, lesion, or ulcer.   DATA REVIEW:   CBC No results for input(s): WBC, HGB, HCT, PLT in the last 168 hours.  Chemistries   Recent Labs Lab 08/24/15 0518  NA 136  K 4.7  CL 90*  CO2 43*  GLUCOSE 114*  BUN 9  CREATININE 0.47  CALCIUM 7.8*    Cardiac Enzymes No results for input(s): TROPONINI in the last 168 hours.  Microbiology Results  Results for orders placed or performed during the hospital encounter of 08/15/15  MRSA PCR Screening     Status: None   Collection Time: 08/16/15 11:53 AM  Result Value Ref Range Status   MRSA by PCR NEGATIVE NEGATIVE  Final    Comment:        The GeneXpert MRSA Assay (FDA approved for NASAL specimens only), is one component of a comprehensive MRSA colonization surveillance program. It is not intended to diagnose MRSA infection nor to guide or monitor treatment for MRSA infections.     RADIOLOGY:  No results found.  Follow up with PCP in 1 week.  Management plans discussed with the patient, family and they are in agreement.  CODE STATUS:     Code Status Orders        Start     Ordered   08/15/15 1556  Full code  Continuous     08/15/15 1555    Code Status History    Date Active Date Inactive Code Status Order ID Comments User Context   08/15/2015  3:55 PM 08/16/2015  9:10 AM Full Code [id number]  Harvie Bridge, DO Inpatient    Advance Directive Documentation   Flowsheet Row Most Recent Value  Type of Advance Directive  Living will  Pre-existing out of facility DNR order (yellow form or pink MOST form)  No data  "MOST" Form in Place?  No data      TOTAL TIME TAKING CARE OF THIS PATIENT ON DAY OF DISCHARGE: more than 30 minutes.   Hillary Bow R M.D on 08/25/2015 at 9:50 AM  Between 7am to 6pm - Pager - [phone]  After 6pm go to [email or web address] - password [email or web address] [facility]  Office  [phone]  CC: Primary care physician; Juluis Pitch, MD  Note: This dictation was prepared with Dragon dictation along with smaller phrase technology. Any transcriptional errors that result from this process are unintentional.

## 2015-08-25 NOTE — Discharge Instructions (Addendum)
Heart Failure Clinic appointment on September 09, 2015 at 11:30am with Darylene Price, [place]. Please call [phone] to reschedule.    DIET:  Cardiac diet  DISCHARGE CONDITION:  Stable  ACTIVITY:  Activity as tolerated  OXYGEN:  Home Oxygen: Yes.     Oxygen Delivery: 3 liters/min via Patient connected to nasal cannula oxygen  DISCHARGE LOCATION:  nursing home   If you experience worsening of your admission symptoms, develop shortness of breath, life threatening emergency, suicidal or homicidal thoughts you must seek medical attention immediately by calling 911 or calling your MD immediately  if symptoms less severe.  You Must read complete instructions/literature along with all the possible adverse reactions/side effects for all the Medicines you take and that have been prescribed to you. Take any new Medicines after you have completely understood and accpet all the possible adverse reactions/side effects.   Please note  You were cared for by a hospitalist during your hospital stay. If you have any questions about your discharge medications or the care you received while you were in the hospital after you are discharged, you can call the unit and asked to speak with the hospitalist on call if the hospitalist that took care of you is not available. Once you are discharged, your primary care physician will handle any further medical issues. Please note that NO REFILLS for any discharge medications will be authorized once you are discharged, as it is imperative that you return to your primary care physician (or establish a relationship with a primary care physician if you do not have one) for your aftercare needs so that they can reassess your need for medications and monitor your lab values.   Automated CPAP with Low pressure 5 and High pressure 20.  Needs Pulmonary follow up in office for sleep study

## 2015-08-25 NOTE — Progress Notes (Signed)
Report called to Dutch Gray, RN at [facility].

## 2015-08-25 NOTE — Progress Notes (Signed)
911 called for non emergent transport to [facility]

## 2015-08-25 NOTE — Progress Notes (Signed)
[facility]  ROUNDING NOTE   Subjective:   UOP 1000 Na 136 (132)   Furosemide 20mg  PO daily  Objective:  Vital signs in last 24 hours:  Temp:  [97.5 F (36.4 C)-98.1 F (36.7 C)] 98.1 F (36.7 C) (08/21 0800) Pulse Rate:  [74-86] 74 (08/21 0800) Resp:  [16-22] 17 (08/21 0800) BP: (127-132)/(45-69) 127/45 (08/21 0800) SpO2:  [91 %-96 %] 94 % (08/21 0800) Weight:  [110.5 kg (243 lb 8 oz)] 110.5 kg (243 lb 8 oz) (08/21 0421)  Weight change: -0.091 kg (-3.2 oz) Filed Weights   08/23/15 0445 08/24/15 0500 08/25/15 0421  Weight: 111.7 kg (246 lb 3.2 oz) 110.5 kg (243 lb 11.2 oz) 110.5 kg (243 lb 8 oz)    Intake/Output: I/O last 3 completed shifts: In: 600 [P.O.:600] Out: 2000 [Urine:2000]   Intake/Output this shift:  Total I/O In: 480 [P.O.:480] Out: -   Physical Exam: General: No acute distress, laying in bed  Head: Normocephalic, atraumatic. Moist oral mucosal membranes  Eyes: Anicteric  Neck: supple  Lungs:  Scattered rhonchi, normal effort  Heart: S1S2 no rubs  Abdomen:  Soft, nontender, BS present  Extremities: No peripheral edema.  Neurologic: Nonfocal, moving all four extremities  Skin: No lesions       Basic Metabolic Panel:  Recent Labs Lab 08/20/15 0439  08/20/15 1842  08/21/15 0415  08/22/15 0412  08/22/15 1654 08/22/15 2238 08/23/15 0613 08/23/15 1041 08/24/15 0518  NA 123*  < > 118*  < > 121*  < > 126*  < > 127* 129* 132* 132* 136  K 4.5  --  4.4  --  4.3  --   --   --   --   --  4.2  --  4.7  CL 74*  --  71*  --  74*  --   --   --   --   --  86*  --  90*  CO2 36*  --  42*  --  41*  --   --   --   --   --  42*  --  43*  GLUCOSE 87  --  202*  --  82  --   --   --   --   --  87  --  114*  BUN 15  --  17  --  17  --   --   --   --   --  11  --  9  CREATININE 0.74  --  0.69  --  0.64  --  0.68  --   --   --  0.41*  --  0.47  CALCIUM 7.1*  --  7.1*  --  6.8*  --   --   --   --   --  7.5*  --  7.8*  < > = values in this interval  not displayed.  Liver Function Tests: No results for input(s): AST, ALT, ALKPHOS, BILITOT, PROT, ALBUMIN in the last 168 hours. No results for input(s): LIPASE, AMYLASE in the last 168 hours. No results for input(s): AMMONIA in the last 168 hours.  CBC: No results for input(s): WBC, NEUTROABS, HGB, HCT, MCV, PLT in the last 168 hours.  Cardiac Enzymes: No results for input(s): CKTOTAL, CKMB, CKMBINDEX, TROPONINI in the last 168 hours.  BNP: Invalid input(s): POCBNP  CBG:  Recent Labs Lab 08/24/15 0744 08/24/15 1145 08/24/15 1640 08/24/15 2123 08/25/15 0754  GLUCAP 110* 159* 172* 118* 123*  Microbiology: Results for orders placed or performed during the hospital encounter of 08/15/15  MRSA PCR Screening     Status: None   Collection Time: 08/16/15 11:53 AM  Result Value Ref Range Status   MRSA by PCR NEGATIVE NEGATIVE Final    Comment:        The GeneXpert MRSA Assay (FDA approved for NASAL specimens only), is one component of a comprehensive MRSA colonization surveillance program. It is not intended to diagnose MRSA infection nor to guide or monitor treatment for MRSA infections.     Coagulation Studies: No results for input(s): LABPROT, INR in the last 72 hours.  Urinalysis: No results for input(s): COLORURINE, LABSPEC, PHURINE, GLUCOSEU, HGBUR, BILIRUBINUR, KETONESUR, PROTEINUR, UROBILINOGEN, NITRITE, LEUKOCYTESUR in the last 72 hours.  Invalid input(s): APPERANCEUR    Imaging: No results found.   Medications:     . aspirin EC  81 mg Oral Daily  . clotrimazole  10 mg Oral QID  . DULoxetine  60 mg Oral Daily  . enoxaparin (LOVENOX) injection  40 mg Subcutaneous Q12H  . escitalopram  20 mg Oral Daily  . Ferrous Fumarate  1 tablet Oral Daily  . furosemide  20 mg Oral Daily  . gabapentin  400 mg Oral TID  . insulin aspart  0-12 Units Subcutaneous TID AC & HS  . ipratropium  2 spray Nasal QID  . magnesium oxide  400 mg Oral Daily  .  metoCLOPramide  5 mg Oral TID AC  . metoprolol tartrate  12.5 mg Oral BID  . mometasone-formoterol  2 puff Inhalation BID  . pantoprazole  40 mg Oral BID  . polyethylene glycol  17 g Oral Daily  . pravastatin  40 mg Oral Daily  . senna-docusate  1 tablet Oral BID  . sucralfate  1 g Oral TID WC & HS  . tamsulosin  0.4 mg Oral Daily  . tiotropium  18 mcg Inhalation Daily  . traZODone  100 mg Oral QHS  . vitamin B-12  1,000 mcg Oral Daily   acetaminophen, albuterol, benzonatate, ibuprofen, LORazepam, ondansetron (ZOFRAN) IV, phenol, traMADol  Assessment/ Plan:  81 y.o. female with past medical history of breast cancer, COPD, diabetes mellitus type 2, hyperlipidemia, hypertension congestive heart failure ejection fraction 45-50% who was admitted with shortness of breath.  1.  Hyponatremia, likely due to heart failure, responded to therapy with tolvaptan (8/16-8/19) 2.  Acute systolic heart failure. 3.  Acute respiratory failure  4. Hypertension  Plan:  - Continue fluid restriction of 1200 mL per day - Continue Lasix 20 mg by mouth daily.  - Low salt diet  Will need outpatient follow up.     LOS: [place], [place] 8/21/201711:15 AM

## 2015-08-25 NOTE — Clinical Social Work Placement (Signed)
   CLINICAL SOCIAL WORK PLACEMENT  NOTE  Date:  08/25/2015  Patient Details  Name: Kathryn Hayes MRN: [id number] Date of Birth: [birth year]  Clinical Social Work is seeking post-discharge placement for this patient at the [place] level of care (*CSW will initial, date and re-position this form in  chart as items are completed):  Yes   Patient/family provided with [facility] Work Department's list of facilities offering this level of care within the geographic area requested by the patient (or if unable, by the patient's family).  Yes   Patient/family informed of their freedom to choose among providers that offer the needed level of care, that participate in Medicare, Medicaid or managed care program needed by the patient, have an available bed and are willing to accept the patient.  Yes   Patient/family informed of [facility]'s ownership interest in [facility] and [facility], as well as of the fact that they are under no obligation to receive care at these facilities.  PASRR submitted to EDS on 08/19/15     PASRR number received on       Existing PASRR number confirmed on 08/19/15     FL2 transmitted to all facilities in geographic area requested by pt/family on 08/19/15     FL2 transmitted to all facilities within larger geographic area on       Patient informed that his/her managed care company has contracts with or will negotiate with certain facilities, including the following:        Yes   Patient/family informed of bed offers received.  Patient chooses bed at [facility]     Physician recommends and patient chooses bed at  [facility])    Patient to be transferred to  ([facility]) on 08/25/15.  Patient to be transferred to facility by  (EMS)     Patient family notified on 08/25/15 of transfer.  Name of family member notified:  patient's daughter     PHYSICIAN Please sign FL2     Additional Comment:     _______________________________________________ Shela Leff, LCSW 08/25/2015, 2:53 PM

## 2015-08-25 NOTE — Care Management Important Message (Signed)
Important Message  Patient Details  Name: Kathryn Hayes MRN: [id number] Date of Birth: [birth year]   Medicare Important Message Given:  Yes    Jolly Mango, RN 08/25/2015, 9:23 AM

## 2015-08-25 NOTE — Progress Notes (Signed)
Spoke with Kathryn Hayes, [facility] rep at [phone], to notify of non-emergent EMS transport.  Auth notification reference given as [id number].   Service date range good from 08/25/15 - 11/23/15.   Gap exception requested to determine if services can be considered at an in-network level.

## 2015-08-25 NOTE — Clinical Social Work Note (Signed)
Patient to discharge today to [facility]. Patient's daughter: Ms. Valere Dross is aware and is requesting EMS transport. Discharge information sent and nurse to call report. CSW has arranged for MD to choose CPAP and provide settings for [facility].  Shela Leff MSW,LCSW [phone]

## 2015-09-09 ENCOUNTER — Ambulatory Visit (INDEPENDENT_AMBULATORY_CARE_PROVIDER_SITE_OTHER): Payer: Medicare Other | Admitting: Internal Medicine

## 2015-09-09 ENCOUNTER — Encounter: Payer: Self-pay | Admitting: Family

## 2015-09-09 ENCOUNTER — Encounter: Payer: Self-pay | Admitting: Internal Medicine

## 2015-09-09 ENCOUNTER — Ambulatory Visit: Payer: Medicare Other | Attending: Family | Admitting: Family

## 2015-09-09 VITALS — BP 113/51 | HR 72 | Resp 20 | Ht 59.0 in | Wt 241.0 lb

## 2015-09-09 VITALS — BP 124/70 | HR 75 | Ht 59.0 in | Wt 241.0 lb

## 2015-09-09 DIAGNOSIS — Z87891 Personal history of nicotine dependence: Secondary | ICD-10-CM | POA: Diagnosis not present

## 2015-09-09 DIAGNOSIS — F329 Major depressive disorder, single episode, unspecified: Secondary | ICD-10-CM | POA: Diagnosis not present

## 2015-09-09 DIAGNOSIS — Z800 Family history of malignant neoplasm of digestive organs: Secondary | ICD-10-CM | POA: Insufficient documentation

## 2015-09-09 DIAGNOSIS — G4719 Other hypersomnia: Secondary | ICD-10-CM | POA: Diagnosis not present

## 2015-09-09 DIAGNOSIS — I110 Hypertensive heart disease with heart failure: Secondary | ICD-10-CM | POA: Insufficient documentation

## 2015-09-09 DIAGNOSIS — Z803 Family history of malignant neoplasm of breast: Secondary | ICD-10-CM | POA: Insufficient documentation

## 2015-09-09 DIAGNOSIS — J9611 Chronic respiratory failure with hypoxia: Secondary | ICD-10-CM | POA: Diagnosis not present

## 2015-09-09 DIAGNOSIS — R600 Localized edema: Secondary | ICD-10-CM | POA: Insufficient documentation

## 2015-09-09 DIAGNOSIS — E785 Hyperlipidemia, unspecified: Secondary | ICD-10-CM | POA: Insufficient documentation

## 2015-09-09 DIAGNOSIS — E119 Type 2 diabetes mellitus without complications: Secondary | ICD-10-CM | POA: Insufficient documentation

## 2015-09-09 DIAGNOSIS — F32A Depression, unspecified: Secondary | ICD-10-CM | POA: Insufficient documentation

## 2015-09-09 DIAGNOSIS — Z853 Personal history of malignant neoplasm of breast: Secondary | ICD-10-CM | POA: Diagnosis not present

## 2015-09-09 DIAGNOSIS — J449 Chronic obstructive pulmonary disease, unspecified: Secondary | ICD-10-CM | POA: Diagnosis not present

## 2015-09-09 DIAGNOSIS — I5032 Chronic diastolic (congestive) heart failure: Secondary | ICD-10-CM | POA: Insufficient documentation

## 2015-09-09 DIAGNOSIS — J438 Other emphysema: Secondary | ICD-10-CM | POA: Insufficient documentation

## 2015-09-09 DIAGNOSIS — Z7982 Long term (current) use of aspirin: Secondary | ICD-10-CM | POA: Diagnosis not present

## 2015-09-09 DIAGNOSIS — I10 Essential (primary) hypertension: Secondary | ICD-10-CM

## 2015-09-09 NOTE — Progress Notes (Signed)
Synopsis: Kathryn Hayes was diagnosed with COPD in 2007 and came to the [facility] for the first time in 2013.  She has been hospitalized for a COPD flare in the past.  He uses 2 L O2 continuously.  12/2010 Full PFT [facility]: Ratio 70%, FEV1 1.2 L (65%) clear obstruction on flow volume loop, TLC normal, RV 169% DLCO 41%    HPI  Chief Complaint  Patient presents with  . Hospitalization Follow-up    pt discharged 08/25/15- pt states breathing has improved. pt c/o sob w/exertion & mild cough w/white mucus. on 4L 02.   Ms. Larrison was hosptialized last month.  She has chronic resp failure, COPD, CHF She was hospitlzied for a CHF exacerbation She has a pacemaker  She is feeling better since leaving the hospital.   She is taking lasix once a day and she says that her UOP is adequate.     She is chronically SOB has chronic DOE, morbidly obese Poor resp effort, no signs if infection at this time   Past Medical History:  Diagnosis Date  . Breast cancer ([place])   . Chronic headache   . COPD (chronic obstructive pulmonary disease) ([place])   . Diabetes mellitus   . Hyperlipidemia   . Hypertension     Review of Systems  Constitutional: Positive for fatigue. Negative for chills, fever and unexpected weight change.  HENT: Negative for congestion, nosebleeds, postnasal drip and rhinorrhea.   Respiratory: Positive for shortness of breath. Negative for cough, choking, chest tightness, wheezing and stridor.   Cardiovascular: Positive for leg swelling. Negative for chest pain and palpitations.  Gastrointestinal: Negative for abdominal distention and abdominal pain.  Musculoskeletal: Positive for arthralgias.  Neurological: Negative for dizziness and headaches.  All other systems reviewed and are negative.     Objective:  chronically ill appearing female in wheelchair  Physical Exam  Constitutional: She is oriented to person, place, and time. No distress.  HENT:  Mouth/Throat: No oropharyngeal  exudate.  Eyes: No scleral icterus.  Neck: Neck supple.  Cardiovascular: Normal rate, regular rhythm and normal heart sounds.   No murmur heard. Pulmonary/Chest: No stridor. No respiratory distress. She has no wheezes.  Musculoskeletal: Normal range of motion. She exhibits edema.  Neurological: She is alert and oriented to person, place, and time. No cranial nerve deficit.  Skin: Skin is warm. She is not diaphoretic.  Psychiatric: She has a normal mood and affect.    Vitals:   09/09/15 0857  BP: 124/70  BP Location: Left Wrist  Cuff Size: Normal  Pulse: 75  SpO2: 91%  Weight: 241 lb (109.3 kg)  Height: 4\' 11"  (1.499 m)  3 L nasal cannula  O2 saturation with 2L > 94% at rest; with walking on 3 L > 94%    Review of January 2013 CXR : hyperinflation, increased pulmonary vascularity  CT Chest January 2013: no pe, no clear infiltrate or significant emphysema  12/2010 Full PFT [facility]: Ratio 70%, FEV1 1.2 L (65%) clear obstruction on flow volume loop, TLC normal, RV 169% DLCO 41%  February 2016 chest x-ray images reviewed bilateral pleural effusion, bilateral airspace disease likely consistent with pulmonary edema April 2016 chest x-ray images reviewed there has been improvement in the bilateral pleural effusions and pulmonary edema, there is now a pacemaker in place April 2016 lab work reviewed> surprisingly normal kidney function, hemoglobin 11.1  Assessment & Plan:   81 yo morbidly obese white female with chronic resp failure from severe COPD  and CHF, prognosis is very poor.   COPD (chronic obstructive pulmonary disease)-end stage Gold Stage D 1.Continue Spiriva 2.cotninue advair 3.continue oxygen 2L [place]   Hypoxemic respiratory failure, chronic -She should continue 2 L at rest and 3 L with exertion -high chance of recurrent admissions, prognosis is very poor   Congestive heart failure-seems to be euvolumic state at thistime 1.Continue metoprolol, Lasix 2.follow up  Cardiology   Follow up in 3 months   The Patient requires high complexity decision making for assessment and support, frequent evaluation and titration of therapies. Patient  satisfied with Plan of action and management. All questions answered  Corrin Parker, M.D.  [facility]  Medical Director [place] Director [facility] Cardio-Pulmonary Department

## 2015-09-09 NOTE — Patient Instructions (Signed)
continue inhalers as prescribed Follow up sleep study Continue oxygen 2 L [place]  Chronic Obstructive Pulmonary Disease Chronic obstructive pulmonary disease (COPD) is a common lung condition in which airflow from the lungs is limited. COPD is a general term that can be used to describe many different lung problems that limit airflow, including both chronic bronchitis and emphysema. If you have COPD, your lung function will probably never return to normal, but there are measures you can take to improve lung function and make yourself feel better. CAUSES   Smoking (common).  Exposure to secondhand smoke.  Genetic problems.  Chronic inflammatory lung diseases or recurrent infections. SYMPTOMS  Shortness of breath, especially with physical activity.  Deep, persistent (chronic) cough with a large amount of thick mucus.  Wheezing.  Rapid breaths (tachypnea).  Gray or bluish discoloration (cyanosis) of the skin, especially in your fingers, toes, or lips.  Fatigue.  Weight loss.  Frequent infections or episodes when breathing symptoms become much worse (exacerbations).  Chest tightness. DIAGNOSIS Your health care provider will take a medical history and perform a physical examination to diagnose COPD. Additional tests for COPD may include:  Lung (pulmonary) function tests.  Chest X-ray.  CT scan.  Blood tests. TREATMENT  Treatment for COPD may include:  Inhaler and nebulizer medicines. These help manage the symptoms of COPD and make your breathing more comfortable.  Supplemental oxygen. Supplemental oxygen is only helpful if you have a low oxygen level in your blood.  Exercise and physical activity. These are beneficial for nearly all people with COPD.  Lung surgery or transplant.  Nutrition therapy to gain weight, if you are underweight.  Pulmonary rehabilitation. This may involve working with a team of health care providers and specialists, such as respiratory,  occupational, and physical therapists. HOME CARE INSTRUCTIONS  Take all medicines (inhaled or pills) as directed by your health care provider.  Avoid over-the-counter medicines or cough syrups that dry up your airway (such as antihistamines) and slow down the elimination of secretions unless instructed otherwise by your health care provider.  If you are a smoker, the most important thing that you can do is stop smoking. Continuing to smoke will cause further lung damage and breathing trouble. Ask your health care provider for help with quitting smoking. He or she can direct you to community resources or hospitals that provide support.  Avoid exposure to irritants such as smoke, chemicals, and fumes that aggravate your breathing.  Use oxygen therapy and pulmonary rehabilitation if directed by your health care provider. If you require home oxygen therapy, ask your health care provider whether you should purchase a pulse oximeter to measure your oxygen level at home.  Avoid contact with individuals who have a contagious illness.  Avoid extreme temperature and humidity changes.  Eat healthy foods. Eating smaller, more frequent meals and resting before meals may help you maintain your strength.  Stay active, but balance activity with periods of rest. Exercise and physical activity will help you maintain your ability to do things you want to do.  Preventing infection and hospitalization is very important when you have COPD. Make sure to receive all the vaccines your health care provider recommends, especially the pneumococcal and influenza vaccines. Ask your health care provider whether you need a pneumonia vaccine.  Learn and use relaxation techniques to manage stress.  Learn and use controlled breathing techniques as directed by your health care provider. Controlled breathing techniques include:  Pursed lip breathing. Start by breathing in (inhaling)  through your nose for 1 second. Then, purse  your lips as if you were going to whistle and breathe out (exhale) through the pursed lips for 2 seconds.  Diaphragmatic breathing. Start by putting one hand on your abdomen just above your waist. Inhale slowly through your nose. The hand on your abdomen should move out. Then purse your lips and exhale slowly. You should be able to feel the hand on your abdomen moving in as you exhale.  Learn and use controlled coughing to clear mucus from your lungs. Controlled coughing is a series of short, progressive coughs. The steps of controlled coughing are: 1. Lean your head slightly forward. 2. Breathe in deeply using diaphragmatic breathing. 3. Try to hold your breath for 3 seconds. 4. Keep your mouth slightly open while coughing twice. 5. Spit any mucus out into a tissue. 6. Rest and repeat the steps once or twice as needed. SEEK MEDICAL CARE IF:  You are coughing up more mucus than usual.  There is a change in the color or thickness of your mucus.  Your breathing is more labored than usual.  Your breathing is faster than usual. SEEK IMMEDIATE MEDICAL CARE IF:  You have shortness of breath while you are resting.  You have shortness of breath that prevents you from:  Being able to talk.  Performing your usual physical activities.  You have chest pain lasting longer than 5 minutes.  Your skin color is more cyanotic than usual.  You measure low oxygen saturations for longer than 5 minutes with a pulse oximeter. MAKE SURE YOU:  Understand these instructions.  Will watch your condition.  Will get help right away if you are not doing well or get worse.   This information is not intended to replace advice given to you by your health care provider. Make sure you discuss any questions you have with your health care provider.   Document Released: 09/30/2004 Document Revised: 01/11/2014 Document Reviewed: 08/17/2012 Elsevier Interactive Patient Education [employer].

## 2015-09-09 NOTE — Addendum Note (Signed)
Addended by: Maryanna Shape A on: 09/09/2015 09:48 AM   Modules accepted: Orders

## 2015-09-09 NOTE — Patient Instructions (Signed)
Continue weighing daily and call for an overnight weight gain of > 2 pounds or a weekly weight gain of >5 pounds. 

## 2015-09-09 NOTE — Progress Notes (Signed)
Subjective:    Patient ID: Kathryn Hayes, female    DOB: 20-Apr-1934, 81 y.o.   MRN: [id number]  Congestive Heart Failure  Presents for initial visit. The disease course has been improving. Associated symptoms include edema, fatigue and shortness of breath. Pertinent negatives include no abdominal pain, chest pain, orthopnea or palpitations. The symptoms have been improving. Past treatments include salt and fluid restriction, oxygen and beta blockers. The treatment provided mild relief. Compliance with prior treatments has been good. Her past medical history is significant for chronic lung disease, DM and HTN. There is no history of CVA.  Hypertension  This is a chronic problem. The current episode started more than 1 year ago. The problem is unchanged. The problem is controlled. Associated symptoms include peripheral edema and shortness of breath. Pertinent negatives include no chest pain, neck pain or palpitations. Agents associated with hypertension include NSAIDs. Risk factors for coronary artery disease include diabetes mellitus, dyslipidemia, post-menopausal state, obesity and sedentary lifestyle. Past treatments include diuretics, lifestyle changes and beta blockers. The current treatment provides moderate improvement. Compliance problems include exercise.  Hypertensive end-organ damage includes heart failure.   Past Medical History:  Diagnosis Date  . Breast cancer ([place])   . Chronic headache   . COPD (chronic obstructive pulmonary disease) ([place])   . Diabetes mellitus   . Hyperlipidemia   . Hypertension     Past Surgical History:  Procedure Laterality Date  . BREAST LUMPECTOMY  2007   left  . COLON SURGERY  1993  . TUBAL LIGATION  1971    Family History  Problem Relation Age of Onset  . Factor V Leiden deficiency Sister   . Breast cancer Mother   . Colon cancer Mother     Social History  Substance Use Topics  . Smoking status: Former Smoker    Packs/day: 1.50    Years:  50.00    Types: Cigarettes    Quit date: 10/05/2010  . Smokeless tobacco: Never Used  . Alcohol use No    No Known Allergies  Prior to Admission medications   Medication Sig Start Date End Date Taking? Authorizing Provider  albuterol (PROVENTIL) (2.5 MG/3ML) 0.083% nebulizer solution Take 2.5 mg by nebulization every 4 (four) hours as needed.   Yes Historical Provider, MD  aspirin 81 MG tablet Take 81 mg by mouth daily.   Yes Historical Provider, MD  clotrimazole (MYCELEX) 10 MG troche Take 10 mg by mouth 4 (four) times daily.   Yes Historical Provider, MD  docusate sodium (COLACE) 100 MG capsule Take 100 mg by mouth 2 (two) times daily.   Yes Historical Provider, MD  DULoxetine (CYMBALTA) 60 MG capsule Take 60 mg by mouth daily.   Yes Historical Provider, MD  escitalopram (LEXAPRO) 20 MG tablet Take 20 mg by mouth daily.   Yes Historical Provider, MD  ferrous fumarate (HEMOCYTE - 106 MG FE) 325 (106 FE) MG TABS tablet Take 1 tablet by mouth daily.   Yes Historical Provider, MD  Fluticasone-Salmeterol (ADVAIR) 250-50 MCG/DOSE AEPB Inhale 1 puff into the lungs 2 (two) times daily.   Yes Historical Provider, MD  furosemide (LASIX) 20 MG tablet Take 20 mg by mouth daily.   Yes Historical Provider, MD  gabapentin (NEURONTIN) 400 MG capsule Take 400 mg by mouth 3 (three) times daily.   Yes Historical Provider, MD  Hydrocortisone (GERHARDT'S BUTT CREAM) CREA Apply 1 application topically 2 (two) times daily.   Yes Historical Provider, MD  LORazepam (ATIVAN)  1 MG tablet Take 1 tablet (1 mg total) by mouth 2 (two) times daily as needed for anxiety or sleep. 08/25/15  Yes Srikar Sudini, MD  magnesium oxide (MAG-OX) 400 MG tablet Take 400 mg by mouth daily.   Yes Historical Provider, MD  metoCLOPramide (REGLAN) 5 MG tablet Take 5 mg by mouth 3 (three) times daily before meals.   Yes Historical Provider, MD  metoprolol tartrate (LOPRESSOR) 25 MG tablet Take 25 mg by mouth 2 (two) times daily.   Yes  Historical Provider, MD  nystatin (NYSTATIN) powder Apply topically 2 (two) times daily.   Yes Historical Provider, MD  OXYGEN Inhale 3-4 L into the lungs. At all times for COPD   Yes Historical Provider, MD  pantoprazole (PROTONIX) 40 MG tablet Take 40 mg by mouth 2 (two) times daily.    Yes Historical Provider, MD  pravastatin (PRAVACHOL) 40 MG tablet Take 40 mg by mouth at bedtime.    Yes Historical Provider, MD  Respiratory Therapy Supplies (FLUTTER) DEVI Use as directed 05/16/14  Yes Juanito Doom, MD  sucralfate (CARAFATE) 1 G tablet Take 1 g by mouth 4 (four) times daily -  with meals and at bedtime.   Yes Historical Provider, MD  tamsulosin (FLOMAX) 0.4 MG CAPS capsule Take 1 capsule (0.4 mg total) by mouth daily. 08/25/15  Yes Srikar Sudini, MD  tiotropium (SPIRIVA) 18 MCG inhalation capsule Place 1 capsule (18 mcg total) into inhaler and inhale daily. 12/05/12  Yes Juanito Doom, MD  traMADol (ULTRAM) 50 MG tablet Take 1 tablet (50 mg total) by mouth 4 (four) times daily as needed for moderate pain. 08/25/15  Yes Srikar Sudini, MD  traZODone (DESYREL) 100 MG tablet Take 1 tablet (100 mg total) by mouth at bedtime. 08/25/15  Yes Srikar Sudini, MD  vitamin B-12 (CYANOCOBALAMIN) 1000 MCG tablet Take 1,000 mcg by mouth daily.   Yes Historical Provider, MD      Review of Systems  Constitutional: Positive for fatigue. Negative for appetite change.  HENT: Positive for sore throat. Negative for congestion and postnasal drip.   Eyes: Negative.   Respiratory: Positive for cough and shortness of breath. Negative for chest tightness.   Cardiovascular: Positive for leg swelling ("little bit"). Negative for chest pain and palpitations.  Gastrointestinal: Negative for abdominal distention and abdominal pain.  Endocrine: Negative.   Genitourinary: Negative.   Musculoskeletal: Positive for arthralgias ("ankles hurt when walking"). Negative for back pain and neck pain.  Skin: Negative.    Allergic/Immunologic: Negative.   Neurological: Positive for light-headedness and numbness (tingling in feet). Negative for dizziness.  Hematological: Does not bruise/bleed easily.  Psychiatric/Behavioral: Negative for dysphoric mood and sleep disturbance (sleeping on 1 pillow with HOB elevated, bipap and oxygen at bedtime). The patient is not nervous/anxious.        Objective:   Physical Exam  Constitutional: She is oriented to person, place, and time. She appears well-developed and well-nourished.  HENT:  Head: Normocephalic and atraumatic.  Eyes: Conjunctivae are normal. Pupils are equal, round, and reactive to light.  Neck: Normal range of motion. Neck supple.  Cardiovascular: Normal rate and regular rhythm.   Pulmonary/Chest: Effort normal. She has no wheezes. She has rhonchi in the left lower field. She has no rales.  Abdominal: Soft. She exhibits no distension. There is no tenderness.  Musculoskeletal: She exhibits edema (trace edema in bilateral lower legs). She exhibits no tenderness.  Neurological: She is alert and oriented to person, place, and time.  Skin: Skin is warm and dry.  Psychiatric: She has a normal mood and affect. Her behavior is normal. Thought content normal.  Nursing note and vitals reviewed.   BP (!) 113/51   Pulse 72   Resp 20   Ht 4\' 11"  (1.499 m)   Wt 241 lb (109.3 kg)   SpO2 95% Comment: on 2L  BMI 48.68 kg/m        Assessment & Plan:  1: Chronic heart failure with preserved ejection fraction- Patient presents with fatigue and shortness of breath with minimal exertion (Class III). Denies having symptoms at rest. Reports great difficulty in walking much because of intense pain that occurs in both of her feet when she walks. No pain at rest. She says that it's her neuropathy from her diabetes. She is being weighed daily at [facility] but she's unsure of what her weight trend has been. Discussed the importance of keeping up with it so that the  provider there can be informed of an overnight weight gain of >2 pounds or a weekly weight gain of >5 pounds. She is not adding any salt to her food and says that there isn't any salt on the table. Reviewed the importance of closely following a 2000mg  sodium diet especially when she goes home and written information was given to her about this as well. Needs an appointment with her cardiologist. 2: HTN- Blood pressure looks good today. Continue medications at this time. 3: COPD- Recently saw Dr. Mortimer Fries. Wears her oxygen at 2L around the clock along with bipap and bedtime. Does have a nebulizer that she uses as needed along with inhalers.  4: Depression- Feels like this is under control at this time.  Medication list was reviewed.  Return here in 1 month or sooner for any questions/problems before then.

## 2015-09-21 ENCOUNTER — Emergency Department: Payer: Medicare Other

## 2015-09-21 ENCOUNTER — Inpatient Hospital Stay
Admission: EM | Admit: 2015-09-21 | Discharge: 2015-09-25 | DRG: 689 | Disposition: A | Discharge status: Home / Self Care | Payer: Medicare Other | Attending: Internal Medicine | Admitting: Internal Medicine

## 2015-09-21 DIAGNOSIS — J449 Chronic obstructive pulmonary disease, unspecified: Secondary | ICD-10-CM | POA: Diagnosis present

## 2015-09-21 DIAGNOSIS — Z803 Family history of malignant neoplasm of breast: Secondary | ICD-10-CM

## 2015-09-21 DIAGNOSIS — E119 Type 2 diabetes mellitus without complications: Secondary | ICD-10-CM | POA: Diagnosis present

## 2015-09-21 DIAGNOSIS — Z9981 Dependence on supplemental oxygen: Secondary | ICD-10-CM

## 2015-09-21 DIAGNOSIS — E785 Hyperlipidemia, unspecified: Secondary | ICD-10-CM | POA: Diagnosis present

## 2015-09-21 DIAGNOSIS — Z7982 Long term (current) use of aspirin: Secondary | ICD-10-CM | POA: Diagnosis not present

## 2015-09-21 DIAGNOSIS — Z6841 Body Mass Index (BMI) 40.0 and over, adult: Secondary | ICD-10-CM | POA: Diagnosis not present

## 2015-09-21 DIAGNOSIS — Z853 Personal history of malignant neoplasm of breast: Secondary | ICD-10-CM | POA: Diagnosis not present

## 2015-09-21 DIAGNOSIS — Z87891 Personal history of nicotine dependence: Secondary | ICD-10-CM | POA: Diagnosis not present

## 2015-09-21 DIAGNOSIS — Z800 Family history of malignant neoplasm of digestive organs: Secondary | ICD-10-CM

## 2015-09-21 DIAGNOSIS — N390 Urinary tract infection, site not specified: Secondary | ICD-10-CM | POA: Diagnosis present

## 2015-09-21 DIAGNOSIS — I5020 Unspecified systolic (congestive) heart failure: Secondary | ICD-10-CM

## 2015-09-21 DIAGNOSIS — J9611 Chronic respiratory failure with hypoxia: Secondary | ICD-10-CM | POA: Diagnosis present

## 2015-09-21 DIAGNOSIS — K219 Gastro-esophageal reflux disease without esophagitis: Secondary | ICD-10-CM | POA: Diagnosis present

## 2015-09-21 DIAGNOSIS — E875 Hyperkalemia: Secondary | ICD-10-CM | POA: Diagnosis present

## 2015-09-21 DIAGNOSIS — Z794 Long term (current) use of insulin: Secondary | ICD-10-CM

## 2015-09-21 DIAGNOSIS — J9811 Atelectasis: Secondary | ICD-10-CM | POA: Diagnosis present

## 2015-09-21 DIAGNOSIS — Z9889 Other specified postprocedural states: Secondary | ICD-10-CM

## 2015-09-21 DIAGNOSIS — Z79899 Other long term (current) drug therapy: Secondary | ICD-10-CM | POA: Diagnosis not present

## 2015-09-21 DIAGNOSIS — I110 Hypertensive heart disease with heart failure: Secondary | ICD-10-CM | POA: Diagnosis present

## 2015-09-21 DIAGNOSIS — Z9851 Tubal ligation status: Secondary | ICD-10-CM

## 2015-09-21 DIAGNOSIS — I5023 Acute on chronic systolic (congestive) heart failure: Secondary | ICD-10-CM | POA: Diagnosis present

## 2015-09-21 DIAGNOSIS — W19XXXA Unspecified fall, initial encounter: Secondary | ICD-10-CM

## 2015-09-21 DIAGNOSIS — I509 Heart failure, unspecified: Secondary | ICD-10-CM

## 2015-09-21 HISTORY — DX: Heart failure, unspecified: I50.9

## 2015-09-21 LAB — URINE DRUG SCREEN, QUALITATIVE (ARMC ONLY)
AMPHETAMINES, UR SCREEN: NOT DETECTED
BENZODIAZEPINE, UR SCRN: NOT DETECTED
Barbiturates, Ur Screen: NOT DETECTED
Cannabinoid 50 Ng, Ur ~~LOC~~: NOT DETECTED
Cocaine Metabolite,Ur ~~LOC~~: NOT DETECTED
MDMA (ECSTASY) UR SCREEN: NOT DETECTED
METHADONE SCREEN, URINE: NOT DETECTED
OPIATE, UR SCREEN: NOT DETECTED
PHENCYCLIDINE (PCP) UR S: NOT DETECTED
Tricyclic, Ur Screen: NOT DETECTED

## 2015-09-21 LAB — CBC WITH DIFFERENTIAL/PLATELET
BASOS ABS: 0 10*3/uL (ref 0–0.1)
BASOS PCT: 1 %
EOS ABS: 0.1 10*3/uL (ref 0–0.7)
EOS PCT: 1 %
HEMATOCRIT: 33.4 % — AB (ref 35.0–47.0)
Hemoglobin: 10.8 g/dL — ABNORMAL LOW (ref 12.0–16.0)
Lymphocytes Relative: 13 %
Lymphs Abs: 0.7 10*3/uL — ABNORMAL LOW (ref 1.0–3.6)
MCH: 28.2 pg (ref 26.0–34.0)
MCHC: 32.2 g/dL (ref 32.0–36.0)
MCV: 87.7 fL (ref 80.0–100.0)
MONO ABS: 0.6 10*3/uL (ref 0.2–0.9)
MONOS PCT: 11 %
NEUTROS ABS: 4 10*3/uL (ref 1.4–6.5)
Neutrophils Relative %: 74 %
PLATELETS: 156 10*3/uL (ref 150–440)
RBC: 3.81 MIL/uL (ref 3.80–5.20)
RDW: 15.2 % — AB (ref 11.5–14.5)
WBC: 5.4 10*3/uL (ref 3.6–11.0)

## 2015-09-21 LAB — LACTIC ACID, PLASMA: Lactic Acid, Venous: 0.8 mmol/L (ref 0.5–1.9)

## 2015-09-21 LAB — URINALYSIS COMPLETE WITH MICROSCOPIC (ARMC ONLY)
BILIRUBIN URINE: NEGATIVE
Bacteria, UA: NONE SEEN
Glucose, UA: NEGATIVE mg/dL
KETONES UR: NEGATIVE mg/dL
NITRITE: NEGATIVE
PH: 5 (ref 5.0–8.0)
Protein, ur: NEGATIVE mg/dL
Specific Gravity, Urine: 1.006 (ref 1.005–1.030)

## 2015-09-21 LAB — GLUCOSE, CAPILLARY: Glucose-Capillary: 123 mg/dL — ABNORMAL HIGH (ref 65–99)

## 2015-09-21 LAB — BASIC METABOLIC PANEL
ANION GAP: 3 — AB (ref 5–15)
BUN: 11 mg/dL (ref 6–20)
CALCIUM: 7.5 mg/dL — AB (ref 8.9–10.3)
CO2: 36 mmol/L — ABNORMAL HIGH (ref 22–32)
CREATININE: 0.79 mg/dL (ref 0.44–1.00)
Chloride: 95 mmol/L — ABNORMAL LOW (ref 101–111)
GFR calc Af Amer: >60 mL/min (ref >60)
GLUCOSE: 197 mg/dL — AB (ref 65–99)
Potassium: 5.4 mmol/L — ABNORMAL HIGH (ref 3.5–5.1)
SODIUM: 134 mmol/L — AB (ref 135–145)

## 2015-09-21 LAB — BRAIN NATRIURETIC PEPTIDE: B NATRIURETIC PEPTIDE 5: 1158 pg/mL — AB (ref 0.0–100.0)

## 2015-09-21 MED ORDER — DOCUSATE SODIUM 100 MG PO CAPS
100.0000 mg | ORAL_CAPSULE | Freq: Two times a day (BID) | ORAL | Status: DC
  Administered 2015-09-21 – 2015-09-25 (×8): 100 mg via ORAL
  Filled 2015-09-21 (×8): qty 1

## 2015-09-21 MED ORDER — FUROSEMIDE 20 MG PO TABS
20.0000 mg | ORAL_TABLET | Freq: Every day | ORAL | Status: DC
  Administered 2015-09-21 – 2015-09-23 (×3): 20 mg via ORAL
  Filled 2015-09-21 (×4): qty 1

## 2015-09-21 MED ORDER — GERHARDT'S BUTT CREAM
1.0000 "application " | TOPICAL_CREAM | Freq: Two times a day (BID) | CUTANEOUS | Status: DC
  Administered 2015-09-21 – 2015-09-25 (×7): 1 via TOPICAL
  Filled 2015-09-21: qty 1

## 2015-09-21 MED ORDER — ESCITALOPRAM OXALATE 10 MG PO TABS
20.0000 mg | ORAL_TABLET | Freq: Every day | ORAL | Status: DC
  Administered 2015-09-21 – 2015-09-25 (×5): 20 mg via ORAL
  Filled 2015-09-21 (×5): qty 2

## 2015-09-21 MED ORDER — PANTOPRAZOLE SODIUM 40 MG PO TBEC
40.0000 mg | DELAYED_RELEASE_TABLET | Freq: Two times a day (BID) | ORAL | Status: DC
  Administered 2015-09-21 – 2015-09-25 (×8): 40 mg via ORAL
  Filled 2015-09-21 (×8): qty 1

## 2015-09-21 MED ORDER — MOMETASONE FURO-FORMOTEROL FUM 200-5 MCG/ACT IN AERO
2.0000 | INHALATION_SPRAY | Freq: Two times a day (BID) | RESPIRATORY_TRACT | Status: DC
  Administered 2015-09-21 – 2015-09-25 (×8): 2 via RESPIRATORY_TRACT
  Filled 2015-09-21: qty 8.8

## 2015-09-21 MED ORDER — ORAL CARE MOUTH RINSE
15.0000 mL | Freq: Two times a day (BID) | OROMUCOSAL | Status: DC
  Administered 2015-09-23 (×2): 15 mL via OROMUCOSAL

## 2015-09-21 MED ORDER — DEXTROSE 5 % IV SOLN
1.0000 g | INTRAVENOUS | Status: AC
  Administered 2015-09-22 – 2015-09-23 (×2): 1 g via INTRAVENOUS
  Filled 2015-09-21 (×2): qty 10

## 2015-09-21 MED ORDER — METOCLOPRAMIDE HCL 10 MG PO TABS
5.0000 mg | ORAL_TABLET | Freq: Three times a day (TID) | ORAL | Status: DC
  Administered 2015-09-22 – 2015-09-24 (×7): 5 mg via ORAL
  Filled 2015-09-21 (×5): qty 1
  Filled 2015-09-21: qty 0.5
  Filled 2015-09-21: qty 1
  Filled 2015-09-21 (×2): qty 0.5

## 2015-09-21 MED ORDER — VITAMIN B-12 1000 MCG PO TABS
1000.0000 ug | ORAL_TABLET | Freq: Every day | ORAL | Status: DC
  Administered 2015-09-21 – 2015-09-25 (×5): 1000 ug via ORAL
  Filled 2015-09-21 (×5): qty 1

## 2015-09-21 MED ORDER — METOPROLOL TARTRATE 25 MG PO TABS
25.0000 mg | ORAL_TABLET | Freq: Two times a day (BID) | ORAL | Status: DC
  Administered 2015-09-21 – 2015-09-25 (×8): 25 mg via ORAL
  Filled 2015-09-21 (×8): qty 1

## 2015-09-21 MED ORDER — NYSTATIN 100000 UNIT/GM EX POWD
Freq: Two times a day (BID) | CUTANEOUS | Status: DC
  Administered 2015-09-21 – 2015-09-25 (×8): via TOPICAL
  Filled 2015-09-21: qty 15

## 2015-09-21 MED ORDER — SUCRALFATE 1 G PO TABS
1.0000 g | ORAL_TABLET | Freq: Three times a day (TID) | ORAL | Status: DC
  Administered 2015-09-21 – 2015-09-25 (×14): 1 g via ORAL
  Filled 2015-09-21 (×14): qty 1

## 2015-09-21 MED ORDER — ASPIRIN EC 81 MG PO TBEC
81.0000 mg | DELAYED_RELEASE_TABLET | Freq: Every day | ORAL | Status: DC
Start: 2015-09-21 — End: 2015-09-25
  Administered 2015-09-21 – 2015-09-25 (×5): 81 mg via ORAL
  Filled 2015-09-21 (×5): qty 1

## 2015-09-21 MED ORDER — CHLORHEXIDINE GLUCONATE 0.12 % MT SOLN
15.0000 mL | Freq: Two times a day (BID) | OROMUCOSAL | Status: DC
  Administered 2015-09-22 – 2015-09-23 (×4): 15 mL via OROMUCOSAL
  Filled 2015-09-21 (×5): qty 15

## 2015-09-21 MED ORDER — TAMSULOSIN HCL 0.4 MG PO CAPS
0.4000 mg | ORAL_CAPSULE | Freq: Every day | ORAL | Status: DC
  Administered 2015-09-21 – 2015-09-25 (×5): 0.4 mg via ORAL
  Filled 2015-09-21 (×5): qty 1

## 2015-09-21 MED ORDER — MELOXICAM 7.5 MG PO TABS
7.5000 mg | ORAL_TABLET | Freq: Every day | ORAL | Status: DC
  Administered 2015-09-21 – 2015-09-25 (×5): 7.5 mg via ORAL
  Filled 2015-09-21 (×5): qty 1

## 2015-09-21 MED ORDER — DULOXETINE HCL 60 MG PO CPEP
60.0000 mg | ORAL_CAPSULE | Freq: Every day | ORAL | Status: DC
  Administered 2015-09-21 – 2015-09-25 (×5): 60 mg via ORAL
  Filled 2015-09-21 (×5): qty 1

## 2015-09-21 MED ORDER — FERROUS SULFATE 325 (65 FE) MG PO TABS
325.0000 mg | ORAL_TABLET | Freq: Every day | ORAL | Status: DC
  Administered 2015-09-22 – 2015-09-25 (×4): 325 mg via ORAL
  Filled 2015-09-21 (×4): qty 1

## 2015-09-21 MED ORDER — MAGNESIUM OXIDE 400 (241.3 MG) MG PO TABS
400.0000 mg | ORAL_TABLET | Freq: Every day | ORAL | Status: DC
Start: 2015-09-21 — End: 2015-09-25
  Administered 2015-09-21 – 2015-09-25 (×5): 400 mg via ORAL
  Filled 2015-09-21 (×5): qty 1

## 2015-09-21 MED ORDER — HEPARIN SODIUM (PORCINE) 5000 UNIT/ML IJ SOLN
5000.0000 [IU] | Freq: Three times a day (TID) | INTRAMUSCULAR | Status: DC
  Administered 2015-09-21 – 2015-09-24 (×9): 5000 [IU] via SUBCUTANEOUS
  Filled 2015-09-21 (×10): qty 1

## 2015-09-21 MED ORDER — INSULIN ASPART 100 UNIT/ML ~~LOC~~ SOLN
0.0000 [IU] | Freq: Three times a day (TID) | SUBCUTANEOUS | Status: DC
  Administered 2015-09-22 – 2015-09-25 (×7): 2 [IU] via SUBCUTANEOUS
  Filled 2015-09-21 (×7): qty 2

## 2015-09-21 MED ORDER — TRAMADOL HCL 50 MG PO TABS
50.0000 mg | ORAL_TABLET | Freq: Four times a day (QID) | ORAL | Status: DC | PRN
Start: 2015-09-21 — End: 2015-09-24
  Administered 2015-09-21 – 2015-09-22 (×2): 50 mg via ORAL
  Filled 2015-09-21 (×2): qty 1

## 2015-09-21 MED ORDER — FUROSEMIDE 10 MG/ML IJ SOLN
20.0000 mg | Freq: Two times a day (BID) | INTRAMUSCULAR | Status: DC
  Administered 2015-09-21 – 2015-09-25 (×8): 20 mg via INTRAVENOUS
  Filled 2015-09-21 (×5): qty 2
  Filled 2015-09-21: qty 4
  Filled 2015-09-21 (×2): qty 2

## 2015-09-21 MED ORDER — LORAZEPAM 1 MG PO TABS
1.0000 mg | ORAL_TABLET | Freq: Two times a day (BID) | ORAL | Status: DC | PRN
  Administered 2015-09-21 – 2015-09-23 (×4): 1 mg via ORAL
  Filled 2015-09-21 (×4): qty 1

## 2015-09-21 MED ORDER — PRAVASTATIN SODIUM 40 MG PO TABS
40.0000 mg | ORAL_TABLET | Freq: Every day | ORAL | Status: DC
  Administered 2015-09-21 – 2015-09-24 (×4): 40 mg via ORAL
  Filled 2015-09-21 (×4): qty 1

## 2015-09-21 MED ORDER — TRAZODONE HCL 100 MG PO TABS
100.0000 mg | ORAL_TABLET | Freq: Every day | ORAL | Status: DC
  Administered 2015-09-21 – 2015-09-24 (×4): 100 mg via ORAL
  Filled 2015-09-21 (×4): qty 1

## 2015-09-21 MED ORDER — ALBUTEROL SULFATE (2.5 MG/3ML) 0.083% IN NEBU: 2.5000 mg | INHALATION_SOLUTION | RESPIRATORY_TRACT | Status: DC | PRN

## 2015-09-21 MED ORDER — TIOTROPIUM BROMIDE MONOHYDRATE 18 MCG IN CAPS
18.0000 ug | ORAL_CAPSULE | Freq: Every day | RESPIRATORY_TRACT | Status: DC
  Administered 2015-09-21 – 2015-09-25 (×5): 18 ug via RESPIRATORY_TRACT
  Filled 2015-09-21: qty 5

## 2015-09-21 MED ORDER — DEXTROSE 5 % IV SOLN
1.0000 g | Freq: Once | INTRAVENOUS | Status: AC
  Administered 2015-09-21: 1 g via INTRAVENOUS
  Filled 2015-09-21: qty 10

## 2015-09-21 MED ORDER — GABAPENTIN 400 MG PO CAPS
400.0000 mg | ORAL_CAPSULE | Freq: Three times a day (TID) | ORAL | Status: DC
  Administered 2015-09-21 – 2015-09-25 (×11): 400 mg via ORAL
  Filled 2015-09-21 (×11): qty 1

## 2015-09-21 NOTE — ED Triage Notes (Signed)
Pt arrives to ED via [facility] from home with c/o being unable to urinate. EMS states pt was d/c'd from [facility] on Saturday where she was in rehab for COPD and ambulation issues. Pt states she cannot remember the last time she voided. Manuela Schwartz, RN performing bladder scan upon arrival, noted to have 112cc and 136cc. Pt wearing Depends which are saturated with urine.

## 2015-09-21 NOTE — ED Notes (Signed)
Daughter took clothes home with her

## 2015-09-21 NOTE — ED Provider Notes (Signed)
[facility] Emergency Department Provider Note  ____________________________________________  Time seen: Approximately 3:31 PM  I have reviewed the triage vital signs and the nursing notes.   HISTORY  Chief Complaint Dysuria    HPI ABCDE SPEZIA is a 81 y.o. female brought the ED due to profound generalized weakness and decreased urine output over the past 4 or 5 days. She had been in the hospital for  a congestive heart failure exacerbation requiring diuresis.She was then discharged to rehabilitation, so slightly discharged home but has not yet had PT OT at home. She's been too weak to get out of bed and complains of worsening shortness of breath and cough. She is having worsening nausea and unable to eat or drink much.  Reported by EMS to have blood pressure 70/40   Past Medical History:  Diagnosis Date  . Breast cancer ([place])   . Chronic headache   . COPD (chronic obstructive pulmonary disease) ([place])   . Diabetes mellitus   . Hyperlipidemia   . Hypertension      Patient Active Problem List   Diagnosis Date Noted  . Depression 09/09/2015  . CHF (congestive heart failure) ([place]) 08/16/2015  . Pleural effusion 05/16/2014  . 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 Barrett's esophagus 01/05/2012  . Hypoxemic respiratory failure, chronic ([place]) 03/10/2011  . COPD (chronic obstructive pulmonary disease) ([place])   . Hypertension   . Hyperlipidemia   . Diabetes mellitus      Past Surgical History:  Procedure Laterality Date  . BREAST LUMPECTOMY  2007   left  . COLON SURGERY  1993  . TUBAL LIGATION  1971     Prior to Admission medications   Medication Sig Start Date End Date Taking? Authorizing Provider  albuterol (PROVENTIL) (2.5 MG/3ML) 0.083% nebulizer solution Take 2.5 mg by nebulization every 4 (four) hours as needed.    Historical Provider, MD  aspirin 81 MG tablet Take 81 mg by mouth daily.    Historical Provider, MD  clotrimazole (MYCELEX) 10  MG troche Take 10 mg by mouth 4 (four) times daily.    Historical Provider, MD  docusate sodium (COLACE) 100 MG capsule Take 100 mg by mouth 2 (two) times daily.    Historical Provider, MD  DULoxetine (CYMBALTA) 60 MG capsule Take 60 mg by mouth daily.    Historical Provider, MD  escitalopram (LEXAPRO) 20 MG tablet Take 20 mg by mouth daily.    Historical Provider, MD  ferrous fumarate (HEMOCYTE - 106 MG FE) 325 (106 FE) MG TABS tablet Take 1 tablet by mouth daily.    Historical Provider, MD  Fluticasone-Salmeterol (ADVAIR) 250-50 MCG/DOSE AEPB Inhale 1 puff into the lungs 2 (two) times daily.    Historical Provider, MD  furosemide (LASIX) 20 MG tablet Take 20 mg by mouth daily.    Historical Provider, MD  gabapentin (NEURONTIN) 400 MG capsule Take 400 mg by mouth 3 (three) times daily.    Historical Provider, MD  Hydrocortisone (GERHARDT'S BUTT CREAM) CREA Apply 1 application topically 2 (two) times daily.    Historical Provider, MD  LORazepam (ATIVAN) 1 MG tablet Take 1 tablet (1 mg total) by mouth 2 (two) times daily as needed for anxiety or sleep. 08/25/15   Hillary Bow, MD  magnesium oxide (MAG-OX) 400 MG tablet Take 400 mg by mouth daily.    Historical Provider, MD  metoCLOPramide (REGLAN) 5 MG tablet Take 5 mg by mouth 3 (three) times daily before meals.    Historical Provider,  MD  metoprolol tartrate (LOPRESSOR) 25 MG tablet Take 25 mg by mouth 2 (two) times daily.    Historical Provider, MD  nystatin (NYSTATIN) powder Apply topically 2 (two) times daily.    Historical Provider, MD  OXYGEN Inhale 3-4 L into the lungs. At all times for COPD    Historical Provider, MD  pantoprazole (PROTONIX) 40 MG tablet Take 40 mg by mouth 2 (two) times daily.     Historical Provider, MD  pravastatin (PRAVACHOL) 40 MG tablet Take 40 mg by mouth at bedtime.     Historical Provider, MD  Respiratory Therapy Supplies (FLUTTER) DEVI Use as directed 05/16/14   Juanito Doom, MD  sucralfate (CARAFATE) 1 G  tablet Take 1 g by mouth 4 (four) times daily -  with meals and at bedtime.    Historical Provider, MD  tamsulosin (FLOMAX) 0.4 MG CAPS capsule Take 1 capsule (0.4 mg total) by mouth daily. 08/25/15   Srikar Sudini, MD  tiotropium (SPIRIVA) 18 MCG inhalation capsule Place 1 capsule (18 mcg total) into inhaler and inhale daily. 12/05/12   Juanito Doom, MD  traMADol (ULTRAM) 50 MG tablet Take 1 tablet (50 mg total) by mouth 4 (four) times daily as needed for moderate pain. 08/25/15   Hillary Bow, MD  traZODone (DESYREL) 100 MG tablet Take 1 tablet (100 mg total) by mouth at bedtime. 08/25/15   Srikar Sudini, MD  vitamin B-12 (CYANOCOBALAMIN) 1000 MCG tablet Take 1,000 mcg by mouth daily.    Historical Provider, MD     Allergies Review of patient's allergies indicates no known allergies.   Family History  Problem Relation Age of Onset  . Factor V Leiden deficiency Sister   . Breast cancer Mother   . Colon cancer Mother     Social History Social History  Substance Use Topics  . Smoking status: Former Smoker    Packs/day: 1.50    Years: 50.00    Types: Cigarettes    Quit date: 10/05/2010  . Smokeless tobacco: Never Used  . Alcohol use No    Review of Systems  Constitutional:   No fever or chills.  ENT:   No sore throat. No rhinorrhea. Cardiovascular:   No chest pain. Respiratory:   Positive shortness of breath and cough. Gastrointestinal:   Positive abdominal pain..  Genitourinary:   Decreased urine output. Musculoskeletal:   Negative for focal pain or swelling  10-point ROS otherwise negative.  ____________________________________________   PHYSICAL EXAM:  VITAL SIGNS: ED Triage Vitals  Enc Vitals Group     BP 09/21/15 1240 99/60     Pulse Rate 09/21/15 1240 80     Resp 09/21/15 1240 18     Temp 09/21/15 1240 97.7 F (36.5 C)     Temp Source 09/21/15 1240 Oral     SpO2 09/21/15 1235 94 %     Weight 09/21/15 1243 241 lb (109.3 kg)     Height 09/21/15 1243 4'  11" (1.499 m)     Head Circumference --      Peak Flow --      Pain Score 09/21/15 1243 0     Pain Loc --      Pain Edu? --      Excl. in [place]? --     Vital signs reviewed, nursing assessments reviewed.   Constitutional:   Alert and oriented. Ill-appearing. Eyes:   No scleral icterus. No conjunctival pallor. PERRL. EOMI.  No nystagmus. ENT   Head:  Normocephalic and atraumatic.   Nose:   No congestion/rhinnorhea. No septal hematoma   Mouth/Throat:   Dry mucous membranes, no pharyngeal erythema. No peritonsillar mass.    Neck:   No stridor. No SubQ emphysema. No meningismus. Hematological/Lymphatic/Immunilogical:   No cervical lymphadenopathy. Cardiovascular:   RRR. Symmetric bilateral radial and DP pulses.  No murmurs.  Respiratory:   Normal respiratory effort without tachypnea nor retractions. Breath sounds are clear and equal bilaterally. No wheezes/rales/rhonchi. Gastrointestinal:   Soft with suprapubic tenderness. Non distended. There is no CVA tenderness.  No rebound, rigidity, or guarding. Genitourinary:   deferred Musculoskeletal:   Nontender with normal range of motion in all extremities. No joint effusions.  No lower extremity tenderness.  No edema. Neurologic:   Normal speech and language.  CN 2-10 normal. Motor grossly intact. No gross focal neurologic deficits are appreciated.  Skin:    Skin is warm, dry and intact. No rash noted.  No petechiae, purpura, or bullae. Poor skin turgor  ____________________________________________    LABS (pertinent positives/negatives) (all labs ordered are listed, but only abnormal results are displayed) Labs Reviewed  BASIC METABOLIC PANEL - Abnormal; Notable for the following:       Result Value   Sodium 134 (*)    Potassium 5.4 (*)    Chloride 95 (*)    CO2 36 (*)    Glucose, Bld 197 (*)    Calcium 7.5 (*)    Anion gap 3 (*)    All other components within normal limits  CBC WITH DIFFERENTIAL/PLATELET - Abnormal;  Notable for the following:    Hemoglobin 10.8 (*)    HCT 33.4 (*)    RDW 15.2 (*)    Lymphs Abs 0.7 (*)    All other components within normal limits  URINALYSIS COMPLETEWITH MICROSCOPIC ([facility] ONLY) - Abnormal; Notable for the following:    Color, Urine YELLOW (*)    APPearance CLOUDY (*)    Hgb urine dipstick 1+ (*)    Leukocytes, UA 3+ (*)    Squamous Epithelial / LPF 0-5 (*)    All other components within normal limits  BRAIN NATRIURETIC PEPTIDE - Abnormal; Notable for the following:    B Natriuretic Peptide 1,158.0 (*)    All other components within normal limits  URINE CULTURE  URINE DRUG SCREEN, QUALITATIVE ([facility] ONLY)  LACTIC ACID, PLASMA   ____________________________________________   EKG    ____________________________________________    RADIOLOGY  Chest x-ray with small pleural effusions and vascular congestion  ____________________________________________   PROCEDURES Procedures  ____________________________________________   INITIAL IMPRESSION / ASSESSMENT AND PLAN / ED COURSE  Pertinent labs & imaging results that were available during my care of the patient were reviewed by me and considered in my medical decision making (see chart for details).  Patient presents with episode of hypotension by EMS and ill-appearing. Found to have urinary tract infection. Cephalexin given, urine culture sent. Repeat vitals in the ED are unremarkable. Oxygen is stable, however the patient is severely symptomatic. Case discussed with hospitalist for further evaluation and management.     Clinical Course   ____________________________________________   FINAL CLINICAL IMPRESSION(S) / ED DIAGNOSES  Final diagnoses:  UTI (lower urinary tract infection)  Systolic congestive heart failure, unspecified congestive heart failure chronicity ([employer])  Fall, initial encounter       Portions of this note were generated with dragon dictation software. Dictation errors  may occur despite best attempts at proofreading.    Carrie Mew, MD 09/21/15 1535

## 2015-09-21 NOTE — H&P (Addendum)
[place] [facility] [place] NAME: Kathryn Hayes    MR#:  [id number]  DATE OF BIRTH:  November 28, 1934  DATE OF ADMISSION:  09/21/2015  PRIMARY CARE PHYSICIAN: Juluis Pitch, MD   REQUESTING/REFERRING PHYSICIAN: Joni Fears  CHIEF COMPLAINT:   Chief Complaint  Patient presents with  . Dysuria    HISTORY OF PRESENT ILLNESS: Kathryn Hayes  is a 81 y.o. female with a known history of CHF, COPD on home O2, Breast ca, DM,HTn- recently in hospital last month for CHF- sent to rehab- went home after 3 weeks last Monday- without much improvement, not able to walk even to bathroom, need help by 3-4 people for that. At home even with minimal exertion to walk to bathroom- with all this help her O2 saturation drops to 60-70% as per family. Son lives nearby. Worried with continued weakness and abdominal swelling now- brought to ER. Found to have UTI and high BNP. She also had urinary urgency and and could not urinate.  PAST MEDICAL HISTORY:   Past Medical History:  Diagnosis Date  . Breast cancer ([place])   . CHF (congestive heart failure) ([place])   . Chronic headache   . COPD (chronic obstructive pulmonary disease) ([place])   . Diabetes mellitus   . Hyperlipidemia   . Hypertension     PAST SURGICAL HISTORY: Past Surgical History:  Procedure Laterality Date  . BREAST LUMPECTOMY  2007   left  . COLON SURGERY  1993  . TUBAL LIGATION  1971    SOCIAL HISTORY:  Social History  Substance Use Topics  . Smoking status: Former Smoker    Packs/day: 1.50    Years: 50.00    Types: Cigarettes    Quit date: 10/05/2010  . Smokeless tobacco: Never Used  . Alcohol use No    FAMILY HISTORY:  Family History  Problem Relation Age of Onset  . Factor V Leiden deficiency Sister   . Breast cancer Mother   . Colon cancer Mother     DRUG ALLERGIES: No Known Allergies  REVIEW OF SYSTEMS:   CONSTITUTIONAL: No fever, positive for fatigue or weakness.  EYES: No blurred or double  vision.  EARS, NOSE, AND THROAT: No tinnitus or ear pain.  RESPIRATORY: No cough, shortness of breath, wheezing or hemoptysis.  CARDIOVASCULAR: No chest pain, orthopnea, edema.  GASTROINTESTINAL: No nausea, vomiting, diarrhea or abdominal pain.  GENITOURINARY: No dysuria, hematuria. Have increased urgency and could not urinate. ENDOCRINE: No polyuria, nocturia,  HEMATOLOGY: No anemia, easy bruising or bleeding SKIN: No rash or lesion. MUSCULOSKELETAL: No joint pain or arthritis.   NEUROLOGIC: No tingling, numbness, weakness.  PSYCHIATRY: No anxiety or depression.   MEDICATIONS AT HOME:  Prior to Admission medications   Medication Sig Start Date End Date Taking? Authorizing Provider  albuterol (PROVENTIL) (2.5 MG/3ML) 0.083% nebulizer solution Take 2.5 mg by nebulization every 4 (four) hours as needed.   Yes Historical Provider, MD  aspirin 81 MG tablet Take 81 mg by mouth daily.   Yes Historical Provider, MD  clotrimazole (MYCELEX) 10 MG troche Take 10 mg by mouth 4 (four) times daily.   Yes Historical Provider, MD  docusate sodium (COLACE) 100 MG capsule Take 100 mg by mouth 2 (two) times daily.   Yes Historical Provider, MD  DULoxetine (CYMBALTA) 60 MG capsule Take 60 mg by mouth daily.   Yes Historical Provider, MD  escitalopram (LEXAPRO) 20 MG tablet Take 20 mg by mouth daily.   Yes Historical Provider,  MD  ferrous sulfate 325 (65 FE) MG tablet Take 325 mg by mouth daily with breakfast.   Yes Historical Provider, MD  Fluticasone-Salmeterol (ADVAIR) 250-50 MCG/DOSE AEPB Inhale 1 puff into the lungs 2 (two) times daily.   Yes Historical Provider, MD  furosemide (LASIX) 20 MG tablet Take 20 mg by mouth daily.   Yes Historical Provider, MD  gabapentin (NEURONTIN) 400 MG capsule Take 400 mg by mouth 3 (three) times daily.   Yes Historical Provider, MD  glipiZIDE-metformin (METAGLIP) 2.5-500 MG tablet Take 1 tablet by mouth 2 (two) times daily before a meal.   Yes Historical Provider, MD   LORazepam (ATIVAN) 1 MG tablet Take 1 tablet (1 mg total) by mouth 2 (two) times daily as needed for anxiety or sleep. 08/25/15  Yes Srikar Sudini, MD  magnesium oxide (MAG-OX) 400 MG tablet Take 400 mg by mouth daily.   Yes Historical Provider, MD  meloxicam (MOBIC) 7.5 MG tablet Take 7.5 mg by mouth daily.   Yes Historical Provider, MD  metoCLOPramide (REGLAN) 5 MG tablet Take 5 mg by mouth 3 (three) times daily before meals.   Yes Historical Provider, MD  metoprolol tartrate (LOPRESSOR) 25 MG tablet Take 25 mg by mouth 2 (two) times daily.   Yes Historical Provider, MD  nystatin (NYSTATIN) powder Apply topically 2 (two) times daily.   Yes Historical Provider, MD  OXYGEN Inhale 3-4 L into the lungs. At all times for COPD   Yes Historical Provider, MD  pantoprazole (PROTONIX) 40 MG tablet Take 40 mg by mouth 2 (two) times daily.    Yes Historical Provider, MD  potassium chloride (K-DUR,KLOR-CON) 10 MEQ tablet Take 10 mEq by mouth 2 (two) times daily.   Yes Historical Provider, MD  pravastatin (PRAVACHOL) 40 MG tablet Take 40 mg by mouth at bedtime.    Yes Historical Provider, MD  sucralfate (CARAFATE) 1 G tablet Take 1 g by mouth 4 (four) times daily -  with meals and at bedtime.   Yes Historical Provider, MD  tamsulosin (FLOMAX) 0.4 MG CAPS capsule Take 1 capsule (0.4 mg total) by mouth daily. 08/25/15  Yes Srikar Sudini, MD  tiotropium (SPIRIVA) 18 MCG inhalation capsule Place 1 capsule (18 mcg total) into inhaler and inhale daily. 12/05/12  Yes Juanito Doom, MD  traMADol (ULTRAM) 50 MG tablet Take 1 tablet (50 mg total) by mouth 4 (four) times daily as needed for moderate pain. 08/25/15  Yes Srikar Sudini, MD  trandolapril (MAVIK) 4 MG tablet Take 2 mg by mouth 2 (two) times daily.   Yes Historical Provider, MD  traZODone (DESYREL) 100 MG tablet Take 1 tablet (100 mg total) by mouth at bedtime. 08/25/15  Yes Srikar Sudini, MD  Hydrocortisone (GERHARDT'S BUTT CREAM) CREA Apply 1 application  topically 2 (two) times daily.    Historical Provider, MD  Respiratory Therapy Supplies (FLUTTER) DEVI Use as directed 05/16/14   Juanito Doom, MD  vitamin B-12 (CYANOCOBALAMIN) 1000 MCG tablet Take 1,000 mcg by mouth daily.    Historical Provider, MD      PHYSICAL EXAMINATION:   VITAL SIGNS: Blood pressure 116/63, pulse 80, temperature 97.7 F (36.5 C), temperature source Oral, resp. rate 18, height 4\' 11"  (1.499 m), weight 109.3 kg (241 lb), SpO2 (!) 85 %.  GENERAL:  81 y.o.-year-old obese patient lying in the bed with no acute distress.  EYES: Pupils equal, round, reactive to light and accommodation. No scleral icterus. Extraocular muscles intact.  HEENT: Head atraumatic, normocephalic.  Oropharynx and nasopharynx clear.  NECK:  Supple, no jugular venous distention. No thyroid enlargement, no tenderness.  LUNGS: Normal breath sounds bilaterally, no wheezing, some crepitation. No use of accessory muscles of respiration. On 3-4 ltr oxygen - as baseline.  CARDIOVASCULAR: S1, S2 normal. No murmurs, rubs, or gallops.  ABDOMEN: Soft, nontender, distended. Bowel sounds present. No organomegaly or mass.  EXTREMITIES: No pedal edema, cyanosis, or clubbing.  NEUROLOGIC: Cranial nerves II through XII are intact. Muscle strength 3/5 in all extremities. Sensation intact. Gait not checked.  PSYCHIATRIC: The patient is alert and oriented x 3. But tries to remain sleepy. SKIN: No obvious rash, lesion, or ulcer.   LABORATORY PANEL:   CBC  Recent Labs Lab 09/21/15 1306  WBC 5.4  HGB 10.8*  HCT 33.4*  PLT 156  MCV 87.7  MCH 28.2  MCHC 32.2  RDW 15.2*  LYMPHSABS 0.7*  MONOABS 0.6  EOSABS 0.1  BASOSABS 0.0   ------------------------------------------------------------------------------------------------------------------  Chemistries   Recent Labs Lab 09/21/15 1306  NA 134*  K 5.4*  CL 95*  CO2 36*  GLUCOSE 197*  BUN 11  CREATININE 0.79  CALCIUM 7.5*    ------------------------------------------------------------------------------------------------------------------ estimated creatinine clearance is 62.7 mL/min (by C-G formula based on SCr of 0.79 mg/dL). ------------------------------------------------------------------------------------------------------------------ No results for input(s): TSH, T4TOTAL, T3FREE, THYROIDAB in the last 72 hours.  Invalid input(s): FREET3   Coagulation profile No results for input(s): INR, PROTIME in the last 168 hours. ------------------------------------------------------------------------------------------------------------------- No results for input(s): DDIMER in the last 72 hours. -------------------------------------------------------------------------------------------------------------------  Cardiac Enzymes No results for input(s): CKMB, TROPONINI, MYOGLOBIN in the last 168 hours.  Invalid input(s): CK ------------------------------------------------------------------------------------------------------------------ Invalid input(s): POCBNP  ---------------------------------------------------------------------------------------------------------------  Urinalysis    Component Value Date/Time   COLORURINE YELLOW (A) 09/21/2015 1243   APPEARANCEUR CLOUDY (A) 09/21/2015 1243   APPEARANCEUR Clear 05/23/2013 1210   LABSPEC 1.006 09/21/2015 1243   LABSPEC 1.009 05/23/2013 1210   PHURINE 5.0 09/21/2015 1243   GLUCOSEU NEGATIVE 09/21/2015 1243   GLUCOSEU Negative 05/23/2013 1210   HGBUR 1+ (A) 09/21/2015 1243   BILIRUBINUR NEGATIVE 09/21/2015 1243   BILIRUBINUR Negative 05/23/2013 1210   KETONESUR NEGATIVE 09/21/2015 1243   PROTEINUR NEGATIVE 09/21/2015 1243   NITRITE NEGATIVE 09/21/2015 1243   LEUKOCYTESUR 3+ (A) 09/21/2015 1243   LEUKOCYTESUR 2+ 05/23/2013 1210     RADIOLOGY: Dg Chest Portable 1 View  Result Date: 09/21/2015 CLINICAL DATA:  81 year old female with urinary  retention and COPD exacerbation EXAM: PORTABLE CHEST 1 VIEW COMPARISON:  Prior chest x-ray 08/20/2015 FINDINGS: Stable cardiomegaly and mediastinal contours. Atherosclerotic calcifications throughout the aorta. Left subclavian approach cardiac rhythm maintenance device with leads in stable position overlying the right atrium and ventricle. Mild pulmonary vascular congestion without overt edema. Small bilateral layering effusions and associated atelectasis appear stable. IMPRESSION: 1. Stable cardiomegaly and pulmonary vascular congestion without overt edema. 2. Small bilateral pleural effusions and associated bibasilar atelectasis. 3. Aortic Atherosclerosis (ICD10-170.0) Electronically Signed   By: Jacqulynn Cadet M.D.   On: 09/21/2015 14:51    EKG: Orders placed or performed during the hospital encounter of 09/21/15  . ED EKG  . ED EKG    IMPRESSION AND PLAN:  * UTi   IV rocephin, follow urine cx.   Bladder scana nd will do catheterization , if needed.  * Ac systolic CHF   IV lasix.   Monitor I/o, and renal func.  * generalized weakness   PT eval.  * DM   Hold oral meds, keep on ISS.  *  Htn   Cont meds.  * Hyperkalemia   Monitor with IV lasix  * Hyperlipidemia   Cont statin.   All the records are reviewed and case discussed with ED provider. Management plans discussed with the patient, family and they are in agreement.  CODE STATUS: Full. Code Status History    Date Active Date Inactive Code Status Order ID Comments User Context   08/15/2015  3:55 PM 08/16/2015  9:10 AM Full Code [id number]  Harvie Bridge, DO Inpatient     Son present in room, I encouraged to discuss code status with his other siblings.  TOTAL TIME TAKING CARE OF THIS PATIENT: 50 minutes.    Vaughan Basta M.D on 09/21/2015   Between 7am to 6pm - Pager - [phone]  After 6pm go to [email or web address] - password [email or web address] [facility]  Office  [phone]  CC: Primary  care physician; Juluis Pitch, MD   Note: This dictation was prepared with Dragon dictation along with smaller phrase technology. Any transcriptional errors that result from this process are unintentional.

## 2015-09-22 ENCOUNTER — Inpatient Hospital Stay: Payer: Medicare Other

## 2015-09-22 LAB — CBC
HEMATOCRIT: 31.7 % — AB (ref 35.0–47.0)
HEMOGLOBIN: 10.2 g/dL — AB (ref 12.0–16.0)
MCH: 27.9 pg (ref 26.0–34.0)
MCHC: 32.2 g/dL (ref 32.0–36.0)
MCV: 86.9 fL (ref 80.0–100.0)
Platelets: 146 10*3/uL — ABNORMAL LOW (ref 150–440)
RBC: 3.65 MIL/uL — AB (ref 3.80–5.20)
RDW: 15.1 % — ABNORMAL HIGH (ref 11.5–14.5)
WBC: 4.7 10*3/uL (ref 3.6–11.0)

## 2015-09-22 LAB — URINE CULTURE: CULTURE: NO GROWTH

## 2015-09-22 LAB — GLUCOSE, CAPILLARY
GLUCOSE-CAPILLARY: 179 mg/dL — AB (ref 65–99)
Glucose-Capillary: 107 mg/dL — ABNORMAL HIGH (ref 65–99)
Glucose-Capillary: 163 mg/dL — ABNORMAL HIGH (ref 65–99)
Glucose-Capillary: 137 mg/dL — ABNORMAL HIGH (ref 65–99)

## 2015-09-22 LAB — BASIC METABOLIC PANEL
ANION GAP: 6 (ref 5–15)
BUN: 11 mg/dL (ref 6–20)
CALCIUM: 7.5 mg/dL — AB (ref 8.9–10.3)
CO2: 37 mmol/L — AB (ref 22–32)
Chloride: 95 mmol/L — ABNORMAL LOW (ref 101–111)
Creatinine, Ser: 0.58 mg/dL (ref 0.44–1.00)
GFR calc non Af Amer: >60 mL/min (ref >60)
GLUCOSE: 115 mg/dL — AB (ref 65–99)
POTASSIUM: 4.6 mmol/L (ref 3.5–5.1)
Sodium: 138 mmol/L (ref 135–145)

## 2015-09-22 NOTE — Progress Notes (Signed)
[facility] [place] [facility] NAME: Kathryn Hayes    MR#:  [id number]  DATE OF BIRTH:  1934-01-16  SUBJECTIVE:  SOB and DOE with LEE  REVIEW OF SYSTEMS:    Review of Systems  Constitutional: Negative for chills, fever and malaise/fatigue.  HENT: Negative.  Negative for ear discharge, ear pain, hearing loss, nosebleeds and sore throat.   Eyes: Negative.  Negative for blurred vision and pain.  Respiratory: Positive for cough and shortness of breath. Negative for hemoptysis and wheezing.   Cardiovascular: Positive for leg swelling. Negative for chest pain and palpitations.  Gastrointestinal: Negative.  Negative for abdominal pain, blood in stool, diarrhea, nausea and vomiting.  Genitourinary: Negative.  Negative for dysuria.  Musculoskeletal: Negative.  Negative for back pain.  Skin: Negative.   Neurological: Positive for weakness. Negative for dizziness, tremors, speech change, focal weakness, seizures and headaches.  Endo/Heme/Allergies: Negative.  Does not bruise/bleed easily.  Psychiatric/Behavioral: Negative.  Negative for depression, hallucinations and suicidal ideas.    Tolerating Diet:yes      DRUG ALLERGIES:  No Known Allergies  VITALS:  Blood pressure (!) 125/59, pulse 86, temperature 97.6 F (36.4 C), temperature source Oral, resp. rate 18, height 4\' 11"  (1.499 m), weight 109.3 kg (241 lb), SpO2 96 %.  PHYSICAL EXAMINATION:   Physical Exam  Constitutional: She is oriented to person, place, and time. No distress.  Morbid obesity  HENT:  Head: Normocephalic.  Eyes: No scleral icterus.  Neck: Normal range of motion. Neck supple. No JVD present. No tracheal deviation present.  Cardiovascular: Normal rate, regular rhythm and normal heart sounds.  Exam reveals no gallop and no friction rub.   No murmur heard. Pulmonary/Chest: Effort normal and breath sounds normal. No respiratory distress. She has no wheezes. She has no rales. She  exhibits no tenderness.  Abdominal: Soft. Bowel sounds are normal. She exhibits no distension and no mass. There is no tenderness. There is no rebound and no guarding.  Musculoskeletal: Normal range of motion. She exhibits edema.  Neurological: She is alert and oriented to person, place, and time.  Skin: Skin is warm. No rash noted. No erythema.  Psychiatric: Affect and judgment normal.      LABORATORY PANEL:   CBC  Recent Labs Lab 09/22/15 0543  WBC 4.7  HGB 10.2*  HCT 31.7*  PLT 146*   ------------------------------------------------------------------------------------------------------------------  Chemistries   Recent Labs Lab 09/22/15 0543  NA 138  K 4.6  CL 95*  CO2 37*  GLUCOSE 115*  BUN 11  CREATININE 0.58  CALCIUM 7.5*   ------------------------------------------------------------------------------------------------------------------  Cardiac Enzymes No results for input(s): TROPONINI in the last 168 hours. ------------------------------------------------------------------------------------------------------------------  RADIOLOGY:  Dg Chest 1 View  Result Date: 09/22/2015 CLINICAL DATA:  81 year old female with congestive heart failure. Initial encounter. EXAM: CHEST 1 VIEW COMPARISON:  09/21/2015 and earlier. FINDINGS: Portable AP upright view at 0927 hours. Stable cardiomegaly and mediastinal contours. Left chest cardiac pacemaker re - demonstrated. Stable left chest wall surgical clips. Stable pulmonary vascularity. Mild retrocardiac atelectasis appears stable. No definite pleural effusions. No pneumothorax. Calcified aortic atherosclerosis. IMPRESSION: Stable chest with cardiomegaly, vascular congestion and mild retrocardiac atelectasis. No overt edema. Electronically Signed   By: Genevie Ann M.D.   On: 09/22/2015 09:46   Dg Chest Portable 1 View  Result Date: 09/21/2015 CLINICAL DATA:  81 year old female with urinary retention and COPD exacerbation EXAM:  PORTABLE CHEST 1 VIEW COMPARISON:  Prior chest x-ray 08/20/2015 FINDINGS: Stable cardiomegaly  and mediastinal contours. Atherosclerotic calcifications throughout the aorta. Left subclavian approach cardiac rhythm maintenance device with leads in stable position overlying the right atrium and ventricle. Mild pulmonary vascular congestion without overt edema. Small bilateral layering effusions and associated atelectasis appear stable. IMPRESSION: 1. Stable cardiomegaly and pulmonary vascular congestion without overt edema. 2. Small bilateral pleural effusions and associated bibasilar atelectasis. 3. Aortic Atherosclerosis (ICD10-170.0) Electronically Signed   By: Jacqulynn Cadet M.D.   On: 09/21/2015 14:51     ASSESSMENT AND PLAN:    81 y/o morbidly obese patient with chronic systolic heart failure EF 45-50% here with Acute on chronic CHF exacerbation and urinary tract infection.  1. Acute on chronic CHF exacerbation: Continue IV Lasix. Continue to monitor Iand O Continue metoprolol.  2. Urinary tract infection: Continue Rocephin and follow up on urine culture.  3. COPD on chronic 4 L oxygen with chronic respiratory failure: Patient has no signs or symptoms of acute COPD exacerbation. Next and continue inhalers.  4. Essential hypertension: Continue metoprolol.  5. GERD: Continue PPI and Carafate. 6. Diabetes: Continue sliding scale insulin with ADA diet. Diabetes coordinator consult. Oral medications on hold for now. Blood sugars ranging 107-123 this morning.  7. Morbid obesity: Encouraged weight loss and diet control.  8. Atelectasis: I assess ordered.   Patient needs social work and physical therapy consult.    Management plans discussed with the patient and she is in agreement.  Discussed with daughter and rounded with nursing staff today CODE STATUS: Full code  TOTAL TIME TAKING CARE OF THIS PATIENT: 33 minutes.     POSSIBLE D/C 2-3 days, DEPENDING ON CLINICAL  CONDITION.   Rajveer Handler M.D on 09/22/2015 at 11:34 AM  Between 7am to 6pm - Pager - [phone] After 6pm go to [email or web address] - password [email or web address] [facility]  Office  [phone]  CC: Primary care physician; Juluis Pitch, MD  Note: This dictation was prepared with Dragon dictation along with smaller phrase technology. Any transcriptional errors that result from this process are unintentional.

## 2015-09-22 NOTE — Evaluation (Signed)
Physical Therapy Evaluation Patient Details Name: Kathryn Hayes MRN: [id number] DOB: [birth year] Today's Date: 09/22/2015   History of Present Illness  Pt is a 81 y/o female presenting with dysuria and O2 desat to 60-70% per family in the chart review. Pt admitted for UTI. PMH of CHF, COPD, breast CA with L lumpectomy, DM, and HTN.  Clinical Impression  Pt was recently sent home from rehab after 3 weeks with pt requiring a minimum of +1 assist to transfer from recliner to [facility]. Pt reports home health aide comes 4x/day with no overnight assistance. Pt baseline approximately 1 month ago per PT evaluation from previous admission was ambulating in home distances with rollator and an agency member to assist with ADLs and homemaking. At this time pt is +2 mod to max for bed mobility and +2 mod assist for transfer sit to stand with inability to weight shift in standing. Pt limited due to fatigue and pain throughout session. Pt will benefit from STR in order to reach PLOF as of 1 month ago and improve noted impairments.     Follow Up Recommendations SNF    Equipment Recommendations  Rolling walker with 5" wheels (Pt has rollator at home)    Recommendations for Other Services       Precautions / Restrictions Precautions Precautions: Fall Restrictions Weight Bearing Restrictions: No      Mobility  Bed Mobility Overal bed mobility: Needs Assistance;+2 for physical assistance Bed Mobility: Supine to Sit;Sit to Supine     Supine to sit: Max assist;+2 for physical assistance;Mod assist Sit to supine: +2 for physical assistance;Max assist   General bed mobility comments: Pt required mod verbal and tactile cues to reach for rail and roll to side to assist in supine to sit to decrease back pain strain with moving B LE's to EOB.   Transfers Overall transfer level: Needs assistance   Transfers: Sit to/from Stand Sit to Stand: Mod assist;+2 physical assistance         General transfer  comment: Pt used bed rail on L UE to assist standing EOB x2. Pt B knees blocked to prevent buckling. Mod verbal cues to stand up straight and weight shift to B LE's unsuccessfully.  Ambulation/Gait             General Gait Details: Ambulation deferred at this time due to pt fatigue/inability to weight shift in standing  Stairs            Wheelchair Mobility    Modified Rankin (Stroke Patients Only)       Balance Overall balance assessment: History of Falls;Needs assistance (Pt reports falling every day; clarified to has fallen to the floor x2 in past 6 mo. and "brush furniture" almost everyday) Sitting-balance support: Feet supported Sitting balance-Leahy Scale: Poor Sitting balance - Comments: Pt required +1 min assist initially sitting EOB to maintain balance and then mod verbal and tactile cues to prevent posterior-R lateral lean   Standing balance support: Single extremity supported Standing balance-Leahy Scale: Poor Standing balance comment: Pt unable to stand fully erect at this time                             Pertinent Vitals/Pain Pain Assessment: 0-10 Pain Score: 9  (9/10 HA (RN provided meds in session); 5/10 back (chronic)) Pain Location: headache and back Pain Descriptors / Indicators: Sore;Other (Comment) (Pt reports back pain starts in B LE and goes up to the  back) Pain Intervention(s): Limited activity within patient's tolerance;Monitored during session;RN gave pain meds during session;Repositioned    Home Living Family/patient expects to be discharged to:: Private residence Living Arrangements: Alone Available Help at Discharge: Family;Home health;Available PRN/intermittently Type of Home: House Home Access: Level entry     Home Layout: One level Home Equipment: Walker - 4 wheels;Grab bars - tub/shower;Bedside commode Additional Comments: Pt reports that she sleeps in her recliner at home. Pt reports home health comes approximately 4x a  day to assist pt with all needs including to [facility] +1 assist. No help overnight and pt reports she will just hold urination/BM or void on self with [facility] to help clean her when they come; social work notified.    Prior Function Level of Independence: Needs assistance   Gait / Transfers Assistance Needed: Pt requires +1 assist and rollator in the home to transfer from recliner to [facility]. Approximately 1 month ago pt was ambulating household distances with rollator. Pt was sent to rehab for 3 weeks with limited improvement per chart prior to return home.  ADL's / Homemaking Assistance Needed: Pt reports having home health aides that come 4 times a day to assist ADLs        Hand Dominance        Extremity/Trunk Assessment   Upper Extremity Assessment: Generalized weakness (Increased difficulty reaching across chest with R UE and pushing up with L UE)           Lower Extremity Assessment: Generalized weakness (Decreased ability to perform supine exercises; required B knee block in standing to prevent buckling)         Communication   Communication: No difficulties  Cognition Arousal/Alertness: Awake/alert Behavior During Therapy: WFL for tasks assessed/performed Overall Cognitive Status: Within Functional Limits for tasks assessed                      General Comments General comments (skin integrity, edema, etc.): Pt agreeable to PT session. Pt vitals monitored throughout session with O2 sats >90% on 4 L. HR 85 bpm at beginning of session and 90 bpm at end of session.     Exercises General Exercises - Lower Extremity Ankle Circles/Pumps: AROM;Strengthening;Both;10 reps;Supine Short Arc Quad: Strengthening;Both;10 reps;Supine;AAROM (B assistance to maintain neutral hip rotation) Heel Slides: Strengthening;10 reps;Supine;AROM;Left (AAROM; strengthening, Right; 10 reps; supine due to increased back pain and to promote completion through full ROM)   Assessment/Plan    PT  Assessment Patient needs continued PT services  PT Problem List Decreased strength;Decreased activity tolerance;Decreased balance;Decreased mobility;Cardiopulmonary status limiting activity;Pain          PT Treatment Interventions DME instruction;Gait training;Therapeutic activities;Therapeutic exercise;Functional mobility training;Balance training;Patient/family education    PT Goals (Current goals can be found in the Care Plan section)  Acute Rehab PT Goals Patient Stated Goal: To improve mobility and have more assistance for ADLs.  PT Goal Formulation: With patient Time For Goal Achievement: 10/06/15 Potential to Achieve Goals: Fair    Frequency Min 2X/week   Barriers to discharge Decreased caregiver support      Co-evaluation               End of Session Equipment Utilized During Treatment: Gait belt;Oxygen (4 L of O2 nasal canula) Activity Tolerance: Patient limited by fatigue;Patient limited by pain Patient left: in bed;with call bell/phone within reach;with bed alarm set Nurse Communication: Mobility status;Precautions (Pt request for water)         Time: [id number] PT  Time Calculation (min) (ACUTE ONLY): 37 min   Charges:         PT G Codes:        Riley Nearing, SPT 09/22/2015, 2:08 PM

## 2015-09-22 NOTE — Progress Notes (Signed)
Received hand off report from Norberta Keens., RN. Continued patient care.  09/22/15. 1515.

## 2015-09-22 NOTE — Care Management Important Message (Signed)
Important Message  Patient Details  Name: Kathryn Hayes MRN: [id number] Date of Birth: 20-May-1934   Medicare Important Message Given:  Yes    Ashvik Grundman A, RN 09/22/2015, 7:57 AM

## 2015-09-23 LAB — GLUCOSE, CAPILLARY
GLUCOSE-CAPILLARY: 115 mg/dL — AB (ref 65–99)
GLUCOSE-CAPILLARY: 157 mg/dL — AB (ref 65–99)
Glucose-Capillary: 165 mg/dL — ABNORMAL HIGH (ref 65–99)
Glucose-Capillary: 157 mg/dL — ABNORMAL HIGH (ref 65–99)

## 2015-09-23 LAB — CBC
HCT: 34.7 % — ABNORMAL LOW (ref 35.0–47.0)
Hemoglobin: 11.2 g/dL — ABNORMAL LOW (ref 12.0–16.0)
MCH: 28.6 pg (ref 26.0–34.0)
MCHC: 32.3 g/dL (ref 32.0–36.0)
MCV: 88.7 fL (ref 80.0–100.0)
PLATELETS: 169 10*3/uL (ref 150–440)
RBC: 3.92 MIL/uL (ref 3.80–5.20)
RDW: 15 % — ABNORMAL HIGH (ref 11.5–14.5)
WBC: 5.1 10*3/uL (ref 3.6–11.0)

## 2015-09-23 LAB — BASIC METABOLIC PANEL
Anion gap: 7 (ref 5–15)
BUN: 13 mg/dL (ref 6–20)
CALCIUM: 7.8 mg/dL — AB (ref 8.9–10.3)
CHLORIDE: 92 mmol/L — AB (ref 101–111)
CO2: 37 mmol/L — ABNORMAL HIGH (ref 22–32)
CREATININE: 0.81 mg/dL (ref 0.44–1.00)
GFR calc non Af Amer: >60 mL/min (ref >60)
Glucose, Bld: 102 mg/dL — ABNORMAL HIGH (ref 65–99)
Potassium: 4.9 mmol/L (ref 3.5–5.1)
SODIUM: 136 mmol/L (ref 135–145)

## 2015-09-23 MED ORDER — ACETAMINOPHEN 325 MG PO TABS
650.0000 mg | ORAL_TABLET | Freq: Four times a day (QID) | ORAL | Status: DC | PRN
  Administered 2015-09-23: 650 mg via ORAL
  Filled 2015-09-23: qty 2

## 2015-09-23 NOTE — NC FL2 (Signed)
[place] LEVEL OF CARE SCREENING TOOL     IDENTIFICATION  Patient Name: Kathryn Hayes Birthdate: 1934/08/23 Sex: female Admission Date (Current Location): 09/21/2015  [facility] and [place] Number:  Engineering geologist and Address:  [facility], [address], [place], [place] [address]      Provider Number: [id number]  Attending Physician Name and Address:  Bettey Costa, MD  Relative Name and Phone Number:       Current Level of Care: Hospital Recommended Level of Care: [place] Prior Approval Number:    Date Approved/Denied:   PASRR Number:    Discharge Plan: SNF    Current Diagnoses: Patient Active Problem List   Diagnosis Date Noted  . UTI (lower urinary tract infection) 09/21/2015  . Depression 09/09/2015  . CHF (congestive heart failure) ([place]) 08/16/2015  . Pleural effusion 05/16/2014  . 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 Barrett's esophagus 01/05/2012  . Hypoxemic respiratory failure, chronic ([place]) 03/10/2011  . COPD (chronic obstructive pulmonary disease) ([place])   . Hypertension   . Hyperlipidemia   . Diabetes mellitus     Orientation RESPIRATION BLADDER Height & Weight     Self, Place, Situation  Normal, O2 (3 liters) Continent Weight: 241 lb (109.3 kg) Height:  4\' 11"  (149.9 cm)  BEHAVIORAL SYMPTOMS/MOOD NEUROLOGICAL BOWEL NUTRITION STATUS   (none)   Continent Diet (heart healthy)  AMBULATORY STATUS COMMUNICATION OF NEEDS Skin   Extensive Assist Verbally                         Personal Care Assistance Level of Assistance  Bathing, Feeding, Dressing Bathing Assistance: Maximum assistance Feeding assistance: Limited assistance Dressing Assistance: Maximum assistance     Functional Limitations Info             SPECIAL CARE FACTORS FREQUENCY  PT (By licensed PT)                    Contractures Contractures Info: Not present    Additional Factors Info  Code Status,  Allergies Code Status Info: full Allergies Info: nka           Current Medications (09/23/2015):  This is the current hospital active medication list Current Facility-Administered Medications  Medication Dose Route Frequency Provider Last Rate Last Dose  . albuterol (PROVENTIL) (2.5 MG/3ML) 0.083% nebulizer solution 2.5 mg  2.5 mg Nebulization Q4H PRN Vaughan Basta, MD      . aspirin EC tablet 81 mg  81 mg Oral Daily Vaughan Basta, MD   81 mg at 09/23/15 0932  . cefTRIAXone (ROCEPHIN) 1 g in dextrose 5 % 50 mL IVPB  1 g Intravenous Q24H Vaughan Basta, MD 100 mL/hr at 09/22/15 1900 1 g at 09/22/15 1900  . chlorhexidine (PERIDEX) 0.12 % solution 15 mL  15 mL Mouth Rinse BID Alexis Hugelmeyer, DO   15 mL at 09/23/15 0934  . docusate sodium (COLACE) capsule 100 mg  100 mg Oral BID Vaughan Basta, MD   100 mg at 09/23/15 0932  . DULoxetine (CYMBALTA) DR capsule 60 mg  60 mg Oral Daily Vaughan Basta, MD   60 mg at 09/23/15 0933  . escitalopram (LEXAPRO) tablet 20 mg  20 mg Oral Daily Vaughan Basta, MD   20 mg at 09/23/15 0932  . ferrous sulfate tablet 325 mg  325 mg Oral Q breakfast Vaughan Basta, MD   325 mg at 09/23/15 0931  . furosemide (  LASIX) injection 20 mg  20 mg Intravenous Q12H Vaughan Basta, MD   20 mg at 09/23/15 0536  . furosemide (LASIX) tablet 20 mg  20 mg Oral Daily Vaughan Basta, MD   20 mg at 09/23/15 0933  . gabapentin (NEURONTIN) capsule 400 mg  400 mg Oral TID Vaughan Basta, MD   400 mg at 09/23/15 0932  . Gerhardt's butt cream 1 application  1 application Topical BID Vaughan Basta, MD   1 application at [id number]  . heparin injection 5,000 Units  5,000 Units Subcutaneous Q8H Vaughan Basta, MD   5,000 Units at 09/23/15 0532  . insulin aspart (novoLOG) injection 0-9 Units  0-9 Units Subcutaneous TID WC Vaughan Basta, MD   2 Units at 09/22/15 1835  . LORazepam (ATIVAN) tablet  1 mg  1 mg Oral BID PRN Vaughan Basta, MD   1 mg at 09/22/15 2323  . magnesium oxide (MAG-OX) tablet 400 mg  400 mg Oral Daily Vaughan Basta, MD   400 mg at 09/23/15 0932  . MEDLINE mouth rinse  15 mL Mouth Rinse q12n4p Alexis Hugelmeyer, DO      . meloxicam (MOBIC) tablet 7.5 mg  7.5 mg Oral Daily Vaughan Basta, MD   7.5 mg at 09/23/15 0932  . metoCLOPramide (REGLAN) tablet 5 mg  5 mg Oral TID AC Vaughan Basta, MD   5 mg at 09/23/15 0930  . metoprolol tartrate (LOPRESSOR) tablet 25 mg  25 mg Oral BID Vaughan Basta, MD   25 mg at 09/23/15 0932  . mometasone-formoterol (DULERA) 200-5 MCG/ACT inhaler 2 puff  2 puff Inhalation BID Vaughan Basta, MD   2 puff at 09/23/15 0934  . nystatin (MYCOSTATIN/NYSTOP) topical powder   Topical BID Vaughan Basta, MD      . pantoprazole (PROTONIX) EC tablet 40 mg  40 mg Oral BID Vaughan Basta, MD   40 mg at 09/23/15 0945  . pravastatin (PRAVACHOL) tablet 40 mg  40 mg Oral QHS Vaughan Basta, MD   40 mg at 09/22/15 2225  . sucralfate (CARAFATE) tablet 1 g  1 g Oral TID WC & HS Vaughan Basta, MD   1 g at 09/23/15 0931  . tamsulosin (FLOMAX) capsule 0.4 mg  0.4 mg Oral Daily Vaughan Basta, MD   0.4 mg at 09/23/15 0932  . tiotropium (SPIRIVA) inhalation capsule 18 mcg  18 mcg Inhalation Daily Vaughan Basta, MD   18 mcg at 09/23/15 0934  . traMADol (ULTRAM) tablet 50 mg  50 mg Oral QID PRN Vaughan Basta, MD   50 mg at 09/22/15 1227  . traZODone (DESYREL) tablet 100 mg  100 mg Oral QHS Vaughan Basta, MD   100 mg at 09/22/15 2224  . vitamin B-12 (CYANOCOBALAMIN) tablet 1,000 mcg  1,000 mcg Oral Daily Vaughan Basta, MD   1,000 mcg at 09/23/15 [id number]     Discharge Medications: Please see discharge summary for a list of discharge medications.  Relevant Imaging Results:  Relevant Lab Results:   Additional Information ss: [id number]; was in rehab for  about 4 weeks and has been out a week and will require more rehab  [facility], LCSW

## 2015-09-23 NOTE — Progress Notes (Signed)
[facility] [place] NAME: Kathryn Hayes    MR#:  [id number]  DATE OF BIRTH:  04-11-1934  SUBJECTIVE:   Lower extremity edema has improved. Patient is on 4 L of oxygen. She was normally on 2 L of oxygen at home. She denies chest pain. Shortness of breath is slowly improving as well. REVIEW OF SYSTEMS:    Review of Systems  Constitutional: Negative for chills, fever and malaise/fatigue.  HENT: Negative.  Negative for ear discharge, ear pain, hearing loss, nosebleeds and sore throat.   Eyes: Negative.  Negative for blurred vision and pain.  Respiratory: Positive for cough and shortness of breath (better). Negative for hemoptysis and wheezing.   Cardiovascular: Positive for leg swelling (improved). Negative for chest pain and palpitations.  Gastrointestinal: Negative.  Negative for abdominal pain, blood in stool, diarrhea, nausea and vomiting.  Genitourinary: Negative.  Negative for dysuria.  Musculoskeletal: Negative.  Negative for back pain.  Skin: Negative.   Neurological: Positive for weakness. Negative for dizziness, tremors, speech change, focal weakness and headaches. Seizures:    Endo/Heme/Allergies: Negative.  Does not bruise/bleed easily.  Psychiatric/Behavioral: Negative.  Negative for depression, hallucinations and suicidal ideas.    Tolerating Diet:yes      DRUG ALLERGIES:  No Known Allergies  VITALS:  Blood pressure (!) 116/52, pulse 73, temperature 98.2 F (36.8 C), temperature source Oral, resp. rate 17, height 4\' 11"  (1.499 m), weight 109.3 kg (241 lb), SpO2 93 %.  PHYSICAL EXAMINATION:   Physical Exam  Constitutional: She is oriented to person, place, and time. No distress.  Morbid obesity  HENT:  Head: Normocephalic.  Eyes: No scleral icterus.  Neck: Normal range of motion. Neck supple. No JVD present. No tracheal deviation present.  Cardiovascular: Normal rate and regular rhythm.  Exam reveals no gallop and no  friction rub.   No murmur heard. Distant heart sounds  Pulmonary/Chest: Effort normal. No respiratory distress. She has no wheezes. She has rales. She exhibits no tenderness.  Due to body habitus very difficult to also take lung sounds for crackles. There is no obvious wheezing or rhonchi  Abdominal: Soft. Bowel sounds are normal. She exhibits no distension and no mass. There is no tenderness. There is no rebound and no guarding.  Musculoskeletal: Normal range of motion. She exhibits edema.  Neurological: She is alert and oriented to person, place, and time.  Skin: Skin is warm. No rash noted. No erythema.  Psychiatric: Affect and judgment normal.      LABORATORY PANEL:   CBC  Recent Labs Lab 09/23/15 0456  WBC 5.1  HGB 11.2*  HCT 34.7*  PLT 169   ------------------------------------------------------------------------------------------------------------------  Chemistries   Recent Labs Lab 09/23/15 0456  NA 136  K 4.9  CL 92*  CO2 37*  GLUCOSE 102*  BUN 13  CREATININE 0.81  CALCIUM 7.8*   ------------------------------------------------------------------------------------------------------------------  Cardiac Enzymes No results for input(s): TROPONINI in the last 168 hours. ------------------------------------------------------------------------------------------------------------------  RADIOLOGY:  Dg Chest 1 View  Result Date: 09/22/2015 CLINICAL DATA:  81 year old female with congestive heart failure. Initial encounter. EXAM: CHEST 1 VIEW COMPARISON:  09/21/2015 and earlier. FINDINGS: Portable AP upright view at 0927 hours. Stable cardiomegaly and mediastinal contours. Left chest cardiac pacemaker re - demonstrated. Stable left chest wall surgical clips. Stable pulmonary vascularity. Mild retrocardiac atelectasis appears stable. No definite pleural effusions. No pneumothorax. Calcified aortic atherosclerosis. IMPRESSION: Stable chest with cardiomegaly, vascular  congestion and mild retrocardiac atelectasis. No overt edema.  Electronically Signed   By: Genevie Ann M.D.   On: 09/22/2015 09:46   Dg Chest Portable 1 View  Result Date: 09/21/2015 CLINICAL DATA:  81 year old female with urinary retention and COPD exacerbation EXAM: PORTABLE CHEST 1 VIEW COMPARISON:  Prior chest x-ray 08/20/2015 FINDINGS: Stable cardiomegaly and mediastinal contours. Atherosclerotic calcifications throughout the aorta. Left subclavian approach cardiac rhythm maintenance device with leads in stable position overlying the right atrium and ventricle. Mild pulmonary vascular congestion without overt edema. Small bilateral layering effusions and associated atelectasis appear stable. IMPRESSION: 1. Stable cardiomegaly and pulmonary vascular congestion without overt edema. 2. Small bilateral pleural effusions and associated bibasilar atelectasis. 3. Aortic Atherosclerosis (ICD10-170.0) Electronically Signed   By: Jacqulynn Cadet M.D.   On: 09/21/2015 14:51     ASSESSMENT AND PLAN:    81 y/o morbidly obese patient with chronic systolic heart failure EF 45-50%(Echo in August 2017 and ( here with Acute on chronic CHF exacerbation and urinary tract infection.  1. Acute on chronic CHF exacerbation: Continue IV Lasix. Continue metoprolol. Follow-up with heart failure clinic as outpatient. 2. Urinary tract infection: Continue Rocephin and follow up on urine culture.  3. COPD on chronic 2 L oxygen with chronic respiratory failure: Patient has no signs or symptoms of acute COPD exacerbation. Continue inhalers.  4. Essential hypertension: Continue metoprolol.  5. GERD: Continue PPI and Carafate. 6. Diabetes: Continue sliding scale insulin with ADA diet. Diabetes coordinator consult. Oral medications on hold for now.   7. Morbid obesity: Encouraged weight loss and diet control.  8. Atelectasis: ISS at bedside  Patient needs skilled nursing facility at discharge. Family looking into  how many days she was in rehabilitation most recently which may determine discharge planning. Discussed case with clinical Education officer, museum.    Management plans discussed with the patient and she is in agreement.  rounded with nursing staff today CODE STATUS: Full code  TOTAL TIME TAKING CARE OF THIS PATIENT: 68minutes.     POSSIBLE D/C 2-3 days, DEPENDING ON CLINICAL CONDITION.   Shalise Rosado M.D on 09/23/2015 at 11:41 AM  Between 7am to 6pm - Pager - [phone] After 6pm go to [email or web address] - password [email or web address] [facility]  Office  [phone]  CC: Primary care physician; Juluis Pitch, MD  Note: This dictation was prepared with Dragon dictation along with smaller phrase technology. Any transcriptional errors that result from this process are unintentional.

## 2015-09-23 NOTE — Progress Notes (Addendum)
Inpatient Diabetes Program Recommendations  AACE/ADA: New Consensus Statement on Inpatient Glycemic Control (2015)  Target Ranges:  Prepandial:   less than 140 mg/dL      [facility] postprandial:   less than 180 mg/dL (1-2 hours)      Critically ill patients:  140 - 180 mg/dL   Lab Results  Component Value Date   GLUCAP 115 (H) 09/23/2015   HGBA1C 6.0 05/03/2014    Review of Glycemic Control  Results for JIALI, MORNING (MRN [id number]) as of 09/23/2015 12:01  Ref. Range 09/22/2015 07:36 09/22/2015 11:58 09/22/2015 16:40 09/22/2015 21:25 09/23/2015 07:39  Glucose-Capillary Latest Ref Range: 65 - 99 mg/dL 107 (H) 179 (H) 163 (H) 137 (H) 115 (H)    Diabetes history: Type 2 Outpatient Diabetes medications: Metaglip 2.5/500mg  bid Current orders for Inpatient glycemic control: Novolog 0-9 units tid  Inpatient Diabetes Program Recommendations:  Noted consult to diabetes coordinator.  Agree with current orders for blood sugar management  Gentry Fitz, RN, [place], [place], CDE Diabetes Coordinator Inpatient Diabetes Program  [phone] (Team Pager) [phone] ([place]) 09/23/2015 12:06 PM

## 2015-09-23 NOTE — Care Management Note (Signed)
Case Management Note  Patient Details  Name: BULEAH EFFRON MRN: [id number] Date of Birth: [birth year]  Subjective/Objective:         Current discharge plan is discharge to SNF. If that is not approved then CM will follow for discharge planning.            Action/Plan:   Expected Discharge Date:                  Expected Discharge Plan:     In-House Referral:     Discharge planning Services     Post Acute Care Choice:    Choice offered to:     DME Arranged:    DME Agency:     HH Arranged:    HH Agency:     Status of Service:     If discussed at [employer] of Stay Meetings, dates discussed:    Additional Comments:  Jalyn Rosero A, RN 09/23/2015, 9:44 AM

## 2015-09-23 NOTE — Clinical Social Work Note (Signed)
Clinical Social Work Assessment  Patient Details  Name: Kathryn Hayes MRN: [id number] Date of Birth: October 16, 1934  Date of referral:  09/23/15               Reason for consult:  Facility Placement                Permission sought to share information with:  Family Supports, Chartered certified accountant granted to share information::  Yes, Verbal Permission Granted  Name::        Agency::     Relationship::     Contact Information:     Housing/Transportation Living arrangements for the past 2 months:  [place], [place] of Information:  Patient, Adult Children Patient Interpreter Needed:  None Criminal Activity/Legal Involvement Pertinent to Current Situation/Hospitalization:  No - Comment as needed Significant Relationships:  None Lives with:  Self Do you feel safe going back to the place where you live?  No Need for family participation in patient care:  Yes (Comment)  Care giving concerns:  Patient lives alone and requires much assistance.    Social Worker assessment / plan:  CSW has spoken with patient's son: Kathryn Hayes (late yesterday) at [phone] and daughter: Kathryn Hayes  (today): [phone] and they would like to pursue rehab for patient. CSW has spoken to patient and she is also agreeable. Patient has recently been at [facility] for rehab for about 4 weeks and has been home about a week. Patient is weakened and unable to care for herself. CSW has initiated a bedsearch and extended bed offers. Patient's daughter and son are discussing and will let CSW know their decision.   Employment status:  Retired Nurse, adult PT Recommendations:  [place] / Referral to community resources:  [place]  Patient/Family's Response to care:  Patient is agreeable to rehab as are her children.   Patient/Family's Understanding of and Emotional Response to Diagnosis, Current  Treatment, and Prognosis:  Unclear if patient realizes her limitations to the extent that they are because she has been staying in a chair in her home and soiling herself as she reports to PT yesterday. Initially she wanted to think about going again to rehab but then decided she would agree to rehab.  Emotional Assessment Appearance:  Appears stated age Attitude/Demeanor/Rapport:  Lethargic (pleasant) Affect (typically observed):  Calm, Accepting Orientation:  Oriented to Self, Oriented to Place Alcohol / Substance use:  Not Applicable Psych involvement (Current and /or in the community):  No (Comment)  Discharge Needs  Concerns to be addressed:  Care Coordination Readmission within the last 30 days:  No Current discharge risk:  None Barriers to Discharge:  No Barriers Identified   Shela Leff, LCSW 09/23/2015, 2:39 PM

## 2015-09-24 LAB — GLUCOSE, CAPILLARY
GLUCOSE-CAPILLARY: 106 mg/dL — AB (ref 65–99)
GLUCOSE-CAPILLARY: 180 mg/dL — AB (ref 65–99)
Glucose-Capillary: 173 mg/dL — ABNORMAL HIGH (ref 65–99)
Glucose-Capillary: 116 mg/dL — ABNORMAL HIGH (ref 65–99)

## 2015-09-24 LAB — BASIC METABOLIC PANEL
ANION GAP: 6 (ref 5–15)
BUN: 10 mg/dL (ref 6–20)
CALCIUM: 7.5 mg/dL — AB (ref 8.9–10.3)
CO2: 38 mmol/L — AB (ref 22–32)
Chloride: 91 mmol/L — ABNORMAL LOW (ref 101–111)
Creatinine, Ser: 0.76 mg/dL (ref 0.44–1.00)
Glucose, Bld: 101 mg/dL — ABNORMAL HIGH (ref 65–99)
Potassium: 4.6 mmol/L (ref 3.5–5.1)
SODIUM: 135 mmol/L (ref 135–145)

## 2015-09-24 MED ORDER — ENOXAPARIN SODIUM 40 MG/0.4ML ~~LOC~~ SOLN
40.0000 mg | Freq: Two times a day (BID) | SUBCUTANEOUS | Status: DC
  Administered 2015-09-24 – 2015-09-25 (×2): 40 mg via SUBCUTANEOUS
  Filled 2015-09-24 (×2): qty 0.4

## 2015-09-24 NOTE — Progress Notes (Signed)
[facility] at [place] NAME: Kathryn Hayes    MR#:  [id number]  DATE OF BIRTH:  April 10, 1934  SUBJECTIVE:  Feeling constipated. Breathing better, on 4 liters (gets hypoxic easily) REVIEW OF SYSTEMS:    Review of Systems  Constitutional: Negative for chills, fever and malaise/fatigue.  HENT: Negative.  Negative for ear discharge, ear pain, hearing loss, nosebleeds and sore throat.   Eyes: Negative.  Negative for blurred vision and pain.  Respiratory: Positive for cough and shortness of breath (better). Negative for hemoptysis and wheezing.   Cardiovascular: Positive for leg swelling (improved). Negative for chest pain and palpitations.  Gastrointestinal: Positive for constipation. Negative for abdominal pain, blood in stool, diarrhea, nausea and vomiting.  Genitourinary: Negative.  Negative for dysuria.  Musculoskeletal: Negative.  Negative for back pain.  Skin: Negative.   Neurological: Positive for weakness. Negative for dizziness, tremors, speech change, focal weakness and headaches. Seizures:    Endo/Heme/Allergies: Negative.  Does not bruise/bleed easily.  Psychiatric/Behavioral: Negative.  Negative for depression, hallucinations and suicidal ideas.    Tolerating Diet:yes DRUG ALLERGIES:  No Known Allergies VITALS:  Blood pressure (!) 127/57, pulse 80, temperature 97.7 F (36.5 C), temperature source Oral, resp. rate 20, height 4\' 11"  (1.499 m), weight 109.3 kg (241 lb), SpO2 100 %. PHYSICAL EXAMINATION:   Physical Exam  Constitutional: She is oriented to person, place, and time. No distress.  Morbid obesity  HENT:  Head: Normocephalic.  Eyes: No scleral icterus.  Neck: Normal range of motion. Neck supple. No JVD present. No tracheal deviation present.  Cardiovascular: Normal rate and regular rhythm.  Exam reveals no gallop and no friction rub.   No murmur heard. Distant heart sounds  Pulmonary/Chest: Effort normal. No respiratory  distress. She has no wheezes. She has rales. She exhibits no tenderness.  Due to body habitus very difficult to also take lung sounds for crackles. There is no obvious wheezing or rhonchi  Abdominal: Soft. Bowel sounds are normal. She exhibits no distension and no mass. There is no tenderness. There is no rebound and no guarding.  Musculoskeletal: Normal range of motion. She exhibits edema.  Neurological: She is alert and oriented to person, place, and time.  Skin: Skin is warm. No rash noted. No erythema.  Psychiatric: Affect and judgment normal.   LABORATORY PANEL:   CBC  Recent Labs Lab 09/23/15 0456  WBC 5.1  HGB 11.2*  HCT 34.7*  PLT 169   ------------------------------------------------------------------------------------------------------------------  Chemistries   Recent Labs Lab 09/24/15 0422  NA 135  K 4.6  CL 91*  CO2 38*  GLUCOSE 101*  BUN 10  CREATININE 0.76  CALCIUM 7.5*   ASSESSMENT AND PLAN:  81 y/o morbidly obese patient with chronic systolic heart failure EF 45-50%(Echo in August 2017 and ( here with Acute on chronic CHF exacerbation and urinary tract infection.  1. Acute on chronic CHF exacerbation: Continue IV Lasix. Continue metoprolol. Follow-up with heart failure clinic as outpatient.  2. Urinary tract infection: Stop Rocephin and urine culture had no growth  3. COPD on chronic 2-4 L oxygen with chronic respiratory failure: Patient has no signs or symptoms of acute COPD exacerbation. Continue inhalers.  4. Essential hypertension: Continue metoprolol.  5. GERD: Continue PPI and Carafate. 6. Diabetes: Continue sliding scale insulin with ADA diet. Diabetes coordinator consult. Oral medications on hold for now.  7. Morbid obesity: Encouraged weight loss and diet control.  8. Atelectasis: ISS at bedside  Patient and  family [facility].  Discussed with patient's daughter Kathryn Hayes via phone and expressed my concern  about worsening clinical health, and likely continued decline.  She is willing to have her evaluated for palliative care and/or hospice while at the facility which is very reasonable.  She is at very high risk for readmissions   Management plans discussed with the patient, her daughter Kathryn Hayes via phone and she is in agreement.  rounded with nursing staff today CODE STATUS: Full code  TOTAL TIME TAKING CARE OF THIS PATIENT: 79minutes.     POSSIBLE D/C in AM, DEPENDING ON CLINICAL CONDITION.  [facility]   [facility], Kathryn Hayes M.D on 09/24/2015 at 3:27 PM  Between 7am to 6pm - Pager - [phone] After 6pm go to [email or web address] - password [email or web address] [facility]  Office  [phone]  CC: Primary care physician; Juluis Pitch, MD  Note: This dictation was prepared with Dragon dictation along with smaller phrase technology. Any transcriptional errors that result from this process are unintentional.

## 2015-09-24 NOTE — Clinical Social Work Note (Signed)
CSW spoke with patient's daughter, Linwood Dibbles, and she has accepted the bed offer for [facility]. Shela Leff MSW,LCSW [phone]

## 2015-09-24 NOTE — Progress Notes (Signed)
Physical Therapy Treatment Patient Details Name: Kathryn Hayes MRN: [id number] DOB: October 24, 1934 Today's Date: 28-Sep-2015    History of Present Illness Pt is a 81 y/o female presenting with dysuria and O2 desat to 60-70% per family in the chart review. Pt admitted for UTI. PMH of CHF, COPD, breast CA with L lumpectomy, DM, and HTN.    PT Comments    Pt participated in minimal exercises as described below.  She was encouraged to attempt bed mobility and standing activities but she declined.  Discussed and educated on immobility but she continued to decline functional mobility skills.   Follow Up Recommendations  SNF     Equipment Recommendations       Recommendations for Other Services       Precautions / Restrictions Precautions Precautions: Fall Restrictions Weight Bearing Restrictions: No    Mobility  Bed Mobility               General bed mobility comments: refused  Transfers                 General transfer comment: refused  Ambulation/Gait                 Stairs            Wheelchair Mobility    Modified Rankin (Stroke Patients Only)       Balance                                    Cognition Arousal/Alertness: Awake/alert Behavior During Therapy: WFL for tasks assessed/performed Overall Cognitive Status: Within Functional Limits for tasks assessed                      Exercises Other Exercises Other Exercises: participated in supine exercises for ankle pumps, heel slides,  ab/adduction x 10 bilaterally    General Comments        Pertinent Vitals/Pain Pain Assessment: 0-10 Pain Score: 5  Pain Location: L leg knee down Pain Descriptors / Indicators: Sore Pain Intervention(s): Limited activity within patient's tolerance    Home Living                      Prior Function            PT Goals (current goals can now be found in the care plan section)      Frequency    Min  2X/week      PT Plan Current plan remains appropriate    Co-evaluation             End of Session Equipment Utilized During Treatment: Oxygen Activity Tolerance: Patient limited by fatigue       Time: [id number] PT Time Calculation (min) (ACUTE ONLY): 10 min  Charges:                       G Codes:      Chesley Noon 09-28-15, 3:20 PM

## 2015-09-25 LAB — GLUCOSE, CAPILLARY
GLUCOSE-CAPILLARY: 106 mg/dL — AB (ref 65–99)
GLUCOSE-CAPILLARY: 153 mg/dL — AB (ref 65–99)

## 2015-09-25 MED ORDER — FLEET ENEMA 7-19 GM/118ML RE ENEM: 1.0000 | ENEMA | Freq: Once | RECTAL | Status: DC

## 2015-09-25 MED ORDER — SENNOSIDES-DOCUSATE SODIUM 8.6-50 MG PO TABS
2.0000 | ORAL_TABLET | Freq: Once | ORAL | Status: AC
  Administered 2015-09-25: 2 via ORAL
  Filled 2015-09-25: qty 2

## 2015-09-25 MED ORDER — FUROSEMIDE 20 MG PO TABS: 40.0000 mg | ORAL_TABLET | Freq: Every day | ORAL | 0 refills | Status: DC

## 2015-09-25 MED ORDER — POLYETHYLENE GLYCOL 3350 17 G PO PACK
17.0000 g | PACK | ORAL | Status: AC
  Administered 2015-09-25: 17 g via ORAL
  Filled 2015-09-25: qty 1

## 2015-09-25 NOTE — Clinical Social Work Note (Addendum)
CSW entry post discharge of patient today: CSW received call from patient's daughter this evening around 8:15pm stating that [facility] did not have a cpap ordered for her mother and that Isaias Cowman was stating that it was on the orders. Patient's daughter states that patient does have a home cpap which they are getting for tonight for her to use. Patient's daughter stated that she had spoken with Rollene Fare Set designer) at [facility] at 3 when she did admission paperwork and asked Rollene Fare about having a cpap at the facility and she states Rollene Fare replied "no problem." CSW reviewed discharge summary and does not see where cpap was ordered but did see that oxygen was ordered and patient's daughter states that this was provided by [facility] this evening. CSW informed patient's daughter that CSW will contact Dr. Manuella Ghazi in the morning to find out more about whether or not he had wanted her on a cpap at night. Patient's daughter voiced concern regarding the fact that the night supervisor at [facility] stated that they did not have all of patient's medications as of yet and that they were coming on a truck. Patient's daughter voiced concern that they did not have a phone in her mother's room and that she could not speak to her mother and that she was concerned that her mother was anxious. CSW informed patient's daughter that if in the morning she had decided she did not wish for patient to remain at [facility] that [place] would assist with having her transferred to another rehab center if desired. Patient will have what she needs in regards to the cpap as her family is going to her home to get it and take it to her this evening. CSW reviewed when discharge information was sent to [facility] and it was sent at 11:57am. CSW did not receive any calls from [facility] regarding any concern regarding patient's orders. CSW attempted to provide supportive listening for patient's daughter this evening. Shela Leff MSW,LCSW [phone]

## 2015-09-25 NOTE — Discharge Instructions (Signed)
Heart Failure  Heart failure means your heart has trouble pumping blood. This makes it hard for your body to work well. Heart failure is usually a [place]-term (chronic) condition. You must take good care of yourself and follow your doctor's treatment plan.  HOME CARE   Take your heart medicine as told by your doctor.    Do not stop taking medicine unless your doctor tells you to.    Do not skip any dose of medicine.    Refill your medicines before they run out.    Take other medicines only as told by your doctor or pharmacist.   Stay active if told by your doctor. The elderly and people with severe heart failure should talk with a doctor about physical activity.   Eat heart-healthy foods. Choose foods that are without trans fat and are low in saturated fat, cholesterol, and salt (sodium). This includes fresh or frozen fruits and vegetables, fish, lean meats, fat-free or low-fat dairy foods, whole grains, and high-fiber foods. Lentils and dried peas and beans (legumes) are also good choices.   Limit salt if told by your doctor.   Cook in a healthy way. Roast, grill, broil, bake, poach, steam, or stir-fry foods.   Limit fluids as told by your doctor.   Weigh yourself every morning. Do this after you pee (urinate) and before you eat breakfast. Write down your weight to give to your doctor.   Take your blood pressure and write it down if your doctor tells you to.   Ask your doctor how to check your pulse. Check your pulse as told.   Lose weight if told by your doctor.   Stop smoking or chewing tobacco. Do not use gum or patches that help you quit without your doctor's approval.   Schedule and go to doctor visits as told.   Nonpregnant women should have no more than 1 drink a day. Men should have no more than 2 drinks a day. Talk to your doctor about drinking alcohol.   Stop illegal drug use.   Stay current with shots (immunizations).   Manage your health conditions as told by your doctor.   Learn to  manage your stress.   Rest when you are tired.   If it is really hot outside:    Avoid intense activities.    Use air conditioning or fans, or get in a cooler place.    Avoid caffeine and alcohol.    Wear loose-fitting, lightweight, and light-colored clothing.   If it is really cold outside:    Avoid intense activities.    Layer your clothing.    Wear mittens or gloves, a hat, and a scarf when going outside.    Avoid alcohol.   Learn about heart failure and get support as needed.   Get help to maintain or improve your quality of life and your ability to care for yourself as needed.  GET HELP IF:    You gain weight quickly.   You are more short of breath than usual.   You cannot do your normal activities.   You tire easily.   You cough more than normal, especially with activity.   You have any or more puffiness (swelling) in areas such as your hands, feet, ankles, or belly (abdomen).   You cannot sleep because it is hard to breathe.   You feel like your heart is beating fast (palpitations).   You get dizzy or light-headed when you stand up.  GET HELP   RIGHT AWAY IF:    You have trouble breathing.   There is a change in mental status, such as becoming less alert or not being able to focus.   You have chest pain or discomfort.   You faint.  MAKE SURE YOU:    Understand these instructions.   Will watch your condition.   Will get help right away if you are not doing well or get worse.     This information is not intended to replace advice given to you by your health care provider. Make sure you discuss any questions you have with your health care provider.     Document Released: 09/30/2007 Document Revised: 01/11/2014 Document Reviewed: 02/07/2012  Elsevier Interactive Patient Education 2016 [employer]

## 2015-09-25 NOTE — Discharge Summary (Signed)
[facility] [place] NAME: Kathryn Hayes    MR#:  [id number]  DATE OF BIRTH:  09/15/1934  DATE OF ADMISSION:  09/21/2015   ADMITTING PHYSICIAN: Vaughan Basta, MD  DATE OF DISCHARGE: 09/25/2015  PRIMARY CARE PHYSICIAN: Juluis Pitch, MD   ADMISSION DIAGNOSIS:  UTI (lower urinary tract infection) [N39.0] Fall, initial encounter [W19.XXXA] Systolic congestive heart failure, unspecified congestive heart failure chronicity ([place]) [I50.20] DISCHARGE DIAGNOSIS:  Principal Problem:   UTI (lower urinary tract infection)  SECONDARY DIAGNOSIS:   Past Medical History:  Diagnosis Date  . Breast cancer ([place])   . CHF (congestive heart failure) ([place])   . Chronic headache   . COPD (chronic obstructive pulmonary disease) ([place])   . Diabetes mellitus   . Hyperlipidemia   . Hypertension    HOSPITAL COURSE:  81 y/o morbidly obese patient with chronic systolic heart failure EF 45-50%(Echo in August 2017 and here with Acute on chronic CHF exacerbation and urinary tract infection.  1. Acute on chronic CHF exacerbation: Well compensated at this time - neg 4 L fluid - Will be on Lasix 40 mg once daily on discharge.  Adjust as needed.  Will need follow-up with CHF clinic  2. Urinary tract infection: Finished antibiotic, and culture negative  3. COPD on chronic 3-4 L oxygen with chronic respiratory failure  Discussed with patient's daughter Linwood Dibbles via phone and expressed my concern about worsening clinical health, and likely continued decline.  She is willing to have her evaluated for palliative care and/or hospice while at the facility if condition declines which is very reasonable. DISCHARGE CONDITIONS:  stable CONSULTS OBTAINED:   DRUG ALLERGIES:  No Known Allergies DISCHARGE MEDICATIONS:     Medication List    STOP taking these medications   LORazepam 1 MG tablet Commonly known as:  ATIVAN   metoCLOPramide 5 MG  tablet Commonly known as:  REGLAN   traMADol 50 MG tablet Commonly known as:  ULTRAM     TAKE these medications   albuterol (2.5 MG/3ML) 0.083% nebulizer solution Commonly known as:  PROVENTIL Take 2.5 mg by nebulization every 4 (four) hours as needed.   aspirin 81 MG tablet Take 81 mg by mouth daily.   clotrimazole 10 MG troche Commonly known as:  MYCELEX Take 10 mg by mouth 4 (four) times daily.   docusate sodium 100 MG capsule Commonly known as:  COLACE Take 100 mg by mouth daily.   DULoxetine 60 MG capsule Commonly known as:  CYMBALTA Take 60 mg by mouth daily.   escitalopram 20 MG tablet Commonly known as:  LEXAPRO Take 20 mg by mouth daily.   ferrous sulfate 325 (65 FE) MG tablet Take 325 mg by mouth daily with breakfast.   Fluticasone-Salmeterol 250-50 MCG/DOSE Aepb Commonly known as:  ADVAIR Inhale 1 puff into the lungs daily.   furosemide 20 MG tablet Commonly known as:  LASIX Take 2 tablets (40 mg total) by mouth daily. What changed:  how much to take   gabapentin 400 MG capsule Commonly known as:  NEURONTIN Take 400 mg by mouth 3 (three) times daily.   glipiZIDE-metformin 2.5-500 MG tablet Commonly known as:  METAGLIP Take 1 tablet by mouth 2 (two) times daily before a meal.   magnesium oxide 400 MG tablet Commonly known as:  MAG-OX Take 400 mg by mouth daily.   meloxicam 7.5 MG tablet Commonly known as:  MOBIC Take 7.5 mg by mouth daily.   metoprolol  tartrate 25 MG tablet Commonly known as:  LOPRESSOR Take 25 mg by mouth 2 (two) times daily.   nystatin powder Generic drug:  nystatin Apply topically daily.   OXYGEN Inhale 3-4 L into the lungs. At all times for COPD   pantoprazole 40 MG tablet Commonly known as:  PROTONIX Take 40 mg by mouth 2 (two) times daily.   potassium chloride 10 MEQ tablet Commonly known as:  K-DUR,KLOR-CON Take 10 mEq by mouth 2 (two) times daily.   pravastatin 40 MG tablet Commonly known as:   PRAVACHOL Take 40 mg by mouth at bedtime.   sucralfate 1 g tablet Commonly known as:  CARAFATE Take 1 g by mouth 4 (four) times daily -  with meals and at bedtime.   tamsulosin 0.4 MG Caps capsule Commonly known as:  FLOMAX Take 1 capsule (0.4 mg total) by mouth daily.   tiotropium 18 MCG inhalation capsule Commonly known as:  SPIRIVA Place 1 capsule (18 mcg total) into inhaler and inhale daily.   trandolapril 4 MG tablet Commonly known as:  MAVIK Take 2 mg by mouth 2 (two) times daily.   traZODone 100 MG tablet Commonly known as:  DESYREL Take 1 tablet (100 mg total) by mouth at bedtime.   vitamin B-12 1000 MCG tablet Commonly known as:  CYANOCOBALAMIN Take 1,000 mcg by mouth daily.      DISCHARGE INSTRUCTIONS:   DIET:  Regular diet DISCHARGE CONDITION:  Good ACTIVITY:  Activity as tolerated OXYGEN:  Home Oxygen: Yes.    Oxygen Delivery: 3-4 liters/min via Patient connected to nasal cannula oxygen DISCHARGE LOCATION:  nursing home   If you experience worsening of your admission symptoms, develop shortness of breath, life threatening emergency, suicidal or homicidal thoughts you must seek medical attention immediately by calling 911 or calling your MD immediately  if symptoms less severe.  You Must read complete instructions/literature along with all the possible adverse reactions/side effects for all the Medicines you take and that have been prescribed to you. Take any new Medicines after you have completely understood and accpet all the possible adverse reactions/side effects.   Please note  You were cared for by a hospitalist during your hospital stay. If you have any questions about your discharge medications or the care you received while you were in the hospital after you are discharged, you can call the unit and asked to speak with the hospitalist on call if the hospitalist that took care of you is not available. Once you are discharged, your primary care  physician will handle any further medical issues. Please note that NO REFILLS for any discharge medications will be authorized once you are discharged, as it is imperative that you return to your primary care physician (or establish a relationship with a primary care physician if you do not have one) for your aftercare needs so that they can reassess your need for medications and monitor your lab values.    On the day of Discharge:  VITAL SIGNS:  Blood pressure 120/61, pulse 82, temperature 97.8 F (36.6 C), temperature source Oral, resp. rate 20, height 4\' 11"  (1.499 m), weight 109.3 kg (241 lb), SpO2 91 %. PHYSICAL EXAMINATION:  GENERAL:  81 y.o.-year-old patient lying in the bed with no acute distress.  EYES: Pupils equal, round, reactive to light and accommodation. No scleral icterus. Extraocular muscles intact.  HEENT: Head atraumatic, normocephalic. Oropharynx and nasopharynx clear.  NECK:  Supple, no jugular venous distention. No thyroid enlargement, no tenderness.  LUNGS: Normal breath sounds bilaterally, no wheezing, rales,rhonchi or crepitation. No use of accessory muscles of respiration.  CARDIOVASCULAR: S1, S2 normal. No murmurs, rubs, or gallops.  ABDOMEN: Soft, non-tender, non-distended. Bowel sounds present. No organomegaly or mass.  EXTREMITIES: No pedal edema, cyanosis, or clubbing.  NEUROLOGIC: Cranial nerves II through XII are intact. Muscle strength 5/5 in all extremities. Sensation intact. Gait not checked.  PSYCHIATRIC: The patient is alert and oriented x 3.  SKIN: No obvious rash, lesion, or ulcer.  DATA REVIEW:   CBC  Recent Labs Lab 09/23/15 0456  WBC 5.1  HGB 11.2*  HCT 34.7*  PLT 169    Chemistries   Recent Labs Lab 09/24/15 0422  NA 135  K 4.6  CL 91*  CO2 38*  GLUCOSE 101*  BUN 10  CREATININE 0.76  CALCIUM 7.5*   She is at high risk for readmission.  She will be under rehabilitation.  While at the facility.  Recommend evaluation by  palliative care if clinical condition declines.  Management plans discussed with the patient, family and they are in agreement.  CODE STATUS: Full code  TOTAL TIME TAKING CARE OF THIS PATIENT: 45 minutes.    [facility], Arpi Diebold M.D on 09/25/2015 at 11:29 AM  Between 7am to 6pm - Pager - [phone]  After 6pm go to [email or web address] - Proofreader  [facility]  Office  [phone]  CC: Primary care physician; Juluis Pitch, MD   Note: This dictation was prepared with Dragon dictation along with smaller phrase technology. Any transcriptional errors that result from this process are unintentional.

## 2015-09-25 NOTE — Progress Notes (Signed)
Report called to [place], Therapist, sports, at [facility]  Patient will be transported via EMS.

## 2015-09-25 NOTE — Progress Notes (Signed)
Spoke with Kathryn Hayes, [facility] rep at [phone], to notify of non-emergent EMS transport.  Auth notification reference given as [id number].   Service date range good from 09/25/15 - 12/24/15.   Geographical gap exception requested to determine if services can be considered at an in-network level.

## 2015-09-25 NOTE — Care Management Important Message (Signed)
Important Message  Patient Details  Name: Kathryn Hayes MRN: [id number] Date of Birth: [birth year]   Medicare Important Message Given:  Yes    Beverly Sessions, RN 09/25/2015, 2:20 PM

## 2015-09-26 ENCOUNTER — Non-Acute Institutional Stay (SKILLED_NURSING_FACILITY): Payer: Medicare Other | Admitting: Internal Medicine

## 2015-09-26 ENCOUNTER — Encounter: Payer: Self-pay | Admitting: Internal Medicine

## 2015-09-26 DIAGNOSIS — K22719 Barrett's esophagus with dysplasia, unspecified: Secondary | ICD-10-CM | POA: Diagnosis not present

## 2015-09-26 DIAGNOSIS — E46 Unspecified protein-calorie malnutrition: Secondary | ICD-10-CM

## 2015-09-26 DIAGNOSIS — J438 Other emphysema: Secondary | ICD-10-CM | POA: Diagnosis not present

## 2015-09-26 DIAGNOSIS — N3000 Acute cystitis without hematuria: Secondary | ICD-10-CM | POA: Diagnosis not present

## 2015-09-26 DIAGNOSIS — I5032 Chronic diastolic (congestive) heart failure: Secondary | ICD-10-CM

## 2015-09-26 DIAGNOSIS — M792 Neuralgia and neuritis, unspecified: Secondary | ICD-10-CM

## 2015-09-26 DIAGNOSIS — R531 Weakness: Secondary | ICD-10-CM

## 2015-09-26 DIAGNOSIS — F329 Major depressive disorder, single episode, unspecified: Secondary | ICD-10-CM

## 2015-09-26 DIAGNOSIS — M159 Polyosteoarthritis, unspecified: Secondary | ICD-10-CM

## 2015-09-26 DIAGNOSIS — F419 Anxiety disorder, unspecified: Secondary | ICD-10-CM | POA: Diagnosis not present

## 2015-09-26 DIAGNOSIS — D638 Anemia in other chronic diseases classified elsewhere: Secondary | ICD-10-CM | POA: Diagnosis not present

## 2015-09-26 DIAGNOSIS — E785 Hyperlipidemia, unspecified: Secondary | ICD-10-CM

## 2015-09-26 DIAGNOSIS — K5900 Constipation, unspecified: Secondary | ICD-10-CM

## 2015-09-26 DIAGNOSIS — E1140 Type 2 diabetes mellitus with diabetic neuropathy, unspecified: Secondary | ICD-10-CM

## 2015-09-26 NOTE — Clinical Social Work Note (Signed)
CSW followed up with Dr. Manuella Ghazi this morning early regarding events of last evening and he has contacted patient's daughter this morning(see his documentation). CSW called patient's daughter as I stated I would last evening and had to leave a message. CSW stated to call this CSW if she needed anything further. Shela Leff MSW,LCSW [phone]

## 2015-09-26 NOTE — Progress Notes (Signed)
Called her daughter Linwood Dibbles @ [phone] and discussed that CPAP must be resumed (some fault on my end for documentation on D/C summary and apologized). She is ok as they've agreed and allowed to bring CPAP from home. Although she seem to have lots of other concerns - patient seem to be getting anxious - I've asked her to request Ativan if need (as she was on it before but due to concern for lethargy her tramadol and ativan was stopped with her consent). This certainly could be Delirium.  I've requested her to discuss with facility and have their provider evaluate and go over her concerns with facility staff if need.  Time spent: 15 mins

## 2015-09-26 NOTE — Progress Notes (Signed)
LOCATION: [facility]  PCP: Juluis Pitch, MD   Code Status: Full Code  Goals of care: Advanced Directive information Advanced Directives 09/21/2015  Does patient have an advance directive? Yes  Type of Advance Directive [place]  Does patient want to make changes to advanced directive? No - Patient declined  Copy of advanced directive(s) in chart? No - copy requested  Would patient like information on creating an advanced directive? -       Extended Emergency Contact Information Primary Emergency Contact: Avie Arenas [place] of [place] Phone: [phone] Mobile Phone: [phone] Relation: Daughter Secondary Emergency Contact: Gazzola,[name] Address: [address]          [place], [place] [address] [name] of [place] Phone: [phone] Relation: Son   No Known Allergies  Chief Complaint  Patient presents with  . New Admit To SNF    New Admission Visit     HPI:  Patient is a 81 y.o. female seen today for short term rehabilitation post hospital admission from 09/21/15-09/25/15 post fall with UTI and acute CHF exacerbation. She was started on antibiotics and diuresed. She has hx of CHF, COPD on home o2. She was residing in another SNF for about a month and daughter calls the facility to inform that there has been legionella outbreak there. Patient is seen in her room today.   Review of Systems:  Constitutional: Negative for fever, chills, diaphoresis. Feels weak and tired. HENT: Negative for congestion, nasal discharge, difficulty swallowing. Positive for headaches.  Eyes: Negative for blurred vision, double vision and discharge.  Respiratory: Negative for shortness of breath and wheezing. Positive for dry cough and feels that mucus is not coming out.  Cardiovascular: Negative for chest pain, palpitations, leg swelling.  Gastrointestinal: Negative for heartburn, nausea, vomiting, abdominal pain. Last bowel movement  was this morning. Genitourinary: Negative for dysuria and flank pain.  Musculoskeletal: Negative for back pain, fall in the facility.  Skin: Negative for itching, rash.  Neurological: Positive for occasional dizziness. Psychiatric/Behavioral: Negative for depression. Positive for anxiety.    Past Medical History:  Diagnosis Date  . Breast cancer ([facility])   . CHF (congestive heart failure) ([facility])   . Chronic headache   . COPD (chronic obstructive pulmonary disease) ([facility])   . Diabetes mellitus   . Hyperlipidemia   . Hypertension    Past Surgical History:  Procedure Laterality Date  . BREAST LUMPECTOMY  2007   left  . COLON SURGERY  1993  . TUBAL LIGATION  1971   Social History:   reports that she quit smoking about 4 years ago. Her smoking use included Cigarettes. She has a 75.00 pack-year smoking history. She has never used smokeless tobacco. She reports that she does not drink alcohol. Her drug history is not on file.  Family History  Problem Relation Age of Onset  . Factor V Leiden deficiency Sister   . Breast cancer Mother   . Colon cancer Mother     Medications:   Medication List       Accurate as of 09/26/15 11:19 AM. Always use your most recent med list.          albuterol (2.5 MG/3ML) 0.083% nebulizer solution Commonly known as:  PROVENTIL Take 2.5 mg by nebulization every 4 (four) hours as needed.   aspirin 81 MG tablet Take 81 mg by mouth daily.   atorvastatin 10 MG tablet Commonly known as:  LIPITOR Take 10 mg by mouth at  bedtime.   clotrimazole 10 MG troche Commonly known as:  MYCELEX Take 10 mg by mouth 4 (four) times daily.   docusate sodium 100 MG capsule Commonly known as:  COLACE Take 100 mg by mouth daily.   DULoxetine 60 MG capsule Commonly known as:  CYMBALTA Take 60 mg by mouth daily.   escitalopram 20 MG tablet Commonly known as:  LEXAPRO Take 20 mg by mouth daily.   ferrous sulfate 325 (65 FE) MG tablet Take 325 mg by mouth  daily with breakfast.   Fluticasone-Salmeterol 250-50 MCG/DOSE Aepb Commonly known as:  ADVAIR Inhale 1 puff into the lungs daily.   furosemide 20 MG tablet Commonly known as:  LASIX Take 2 tablets (40 mg total) by mouth daily.   gabapentin 400 MG capsule Commonly known as:  NEURONTIN Take 400 mg by mouth 3 (three) times daily.   glipiZIDE-metformin 2.5-500 MG tablet Commonly known as:  METAGLIP Take 1 tablet by mouth 2 (two) times daily before a meal.   magnesium oxide 400 MG tablet Commonly known as:  MAG-OX Take 400 mg by mouth daily.   meloxicam 7.5 MG tablet Commonly known as:  MOBIC Take 7.5 mg by mouth daily.   metoprolol tartrate 25 MG tablet Commonly known as:  LOPRESSOR Take 25 mg by mouth 2 (two) times daily.   nystatin powder Generic drug:  nystatin Apply topically daily.   OXYGEN Inhale 3-4 L into the lungs. At all times for COPD   pantoprazole 40 MG tablet Commonly known as:  PROTONIX Take 40 mg by mouth 2 (two) times daily.   potassium chloride 10 MEQ tablet Commonly known as:  K-DUR,KLOR-CON Take 10 mEq by mouth 2 (two) times daily.   sucralfate 1 g tablet Commonly known as:  CARAFATE Take 1 g by mouth 4 (four) times daily -  with meals and at bedtime.   tamsulosin 0.4 MG Caps capsule Commonly known as:  FLOMAX Take 1 capsule (0.4 mg total) by mouth daily.   tiotropium 18 MCG inhalation capsule Commonly known as:  SPIRIVA Place 1 capsule (18 mcg total) into inhaler and inhale daily.   trandolapril 4 MG tablet Commonly known as:  MAVIK Take 2 mg by mouth 2 (two) times daily.   traZODone 100 MG tablet Commonly known as:  DESYREL Take 1 tablet (100 mg total) by mouth at bedtime.   vitamin B-12 1000 MCG tablet Commonly known as:  CYANOCOBALAMIN Take 1,000 mcg by mouth daily.       Immunizations: Immunization History  Administered Date(s) Administered  . Influenza Split 10/15/2013  . Influenza, High Dose Seasonal PF 12/05/2012  .  Pneumococcal Polysaccharide-23 05/16/2010     Physical Exam:  Vitals:   09/26/15 1111  BP: 124/63  Pulse: 86  Resp: 20  Temp: 98.2 F (36.8 C)  TempSrc: Oral  SpO2: 93%  Weight: 240 lb (108.9 kg)  Height: 4\' 11"  (1.499 m)   Body mass index is 48.47 kg/m.  General- elderly female, obese, in no acute distress Head- normocephalic, atraumatic Nose- no maxillary or frontal sinus tenderness, no nasal discharge Throat- moist mucus membrane Eyes- no pallor, no icterus, no discharge, normal conjunctiva, normal sclera Neck- no cervical lymphadenopathy Cardiovascular- normal s1,s2, no murmur, trace leg edema Respiratory- bilateral poor air entry, no wheeze, no rhonchi, no crackles, no use of accessory muscles, on 4 l o2 Abdomen- bowel sounds present, soft, non tender Musculoskeletal- able to move all 4 extremities, generalized weakness Neurological- alert and oriented to person,  place and time Skin- warm and dry Psychiatry- normal mood and affect    Labs reviewed: Basic Metabolic Panel:  Recent Labs  09/22/15 0543 09/23/15 0456 09/24/15 0422  NA 138 136 135  K 4.6 4.9 4.6  CL 95* 92* 91*  CO2 37* 37* 38*  GLUCOSE 115* 102* 101*  BUN 11 13 10   CREATININE 0.58 0.81 0.76  CALCIUM 7.5* 7.8* 7.5*   Liver Function Tests:  Recent Labs  08/15/15 1303  AST 12*  ALT 10*  ALKPHOS 108  BILITOT 0.7  PROT 5.8*  ALBUMIN 3.1*   No results for input(s): LIPASE, AMYLASE in the last 8760 hours. No results for input(s): AMMONIA in the last 8760 hours. CBC:  Recent Labs  09/21/15 1306 09/22/15 0543 09/23/15 0456  WBC 5.4 4.7 5.1  NEUTROABS 4.0  --   --   HGB 10.8* 10.2* 11.2*  HCT 33.4* 31.7* 34.7*  MCV 87.7 86.9 88.7  PLT 156 146* 169   Cardiac Enzymes:  Recent Labs  08/15/15 1607 08/15/15 2239 08/16/15 0426  TROPONINI 0.03* 0.03* 0.04*   BNP: Invalid input(s): POCBNP CBG:  Recent Labs  09/24/15 2111 09/25/15 0748 09/25/15 1123  GLUCAP 116* 106*  153*    Radiological Exams: Dg Chest 1 View  Result Date: 09/22/2015 CLINICAL DATA:  81 year old female with congestive heart failure. Initial encounter. EXAM: CHEST 1 VIEW COMPARISON:  09/21/2015 and earlier. FINDINGS: Portable AP upright view at 0927 hours. Stable cardiomegaly and mediastinal contours. Left chest cardiac pacemaker re - demonstrated. Stable left chest wall surgical clips. Stable pulmonary vascularity. Mild retrocardiac atelectasis appears stable. No definite pleural effusions. No pneumothorax. Calcified aortic atherosclerosis. IMPRESSION: Stable chest with cardiomegaly, vascular congestion and mild retrocardiac atelectasis. No overt edema. Electronically Signed   By: Genevie Ann M.D.   On: 09/22/2015 09:46   Dg Chest Portable 1 View  Result Date: 09/21/2015 CLINICAL DATA:  81 year old female with urinary retention and COPD exacerbation EXAM: PORTABLE CHEST 1 VIEW COMPARISON:  Prior chest x-ray 08/20/2015 FINDINGS: Stable cardiomegaly and mediastinal contours. Atherosclerotic calcifications throughout the aorta. Left subclavian approach cardiac rhythm maintenance device with leads in stable position overlying the right atrium and ventricle. Mild pulmonary vascular congestion without overt edema. Small bilateral layering effusions and associated atelectasis appear stable. IMPRESSION: 1. Stable cardiomegaly and pulmonary vascular congestion without overt edema. 2. Small bilateral pleural effusions and associated bibasilar atelectasis. 3. Aortic Atherosclerosis (ICD10-170.0) Electronically Signed   By: Jacqulynn Cadet M.D.   On: 09/21/2015 14:51    Assessment/Plan  Generalized weakness From deconditioning. Will have patient work with PT/OT as tolerated to regain strength and restore function.  Fall precautions are in place.  UTI Asymptomatic at present and has completed her rocephin. Hydration to be maintained. Clinical monitor  Protein calorie malnutrition RD to  evaluate  Legionella exposure In another SNF. Consulted ID Dr Johnnye Sima over the phone. Patient is afebrile, alert and oriented and reviewed her cxr from hospital that shows no infiltrate. Send urine for legionella antigen for now and monitor. Reviewed this with pt and daughter  Anxiety Per daughter was taking ativan 1 mg tid at home and has been off it since this Tuesday. Start ativan 1 mg q8h prn for now and monitor  CHF Monitor daily weight x 1 week and then 3 days a week. Continue metoprolol tartrate 25 mg bid, trandolapril 4 mg bid and lasix 40 mg daily. Check bmp. Continue kcl  Anemia of chronic disease Monitor cbc. Continue feso4  Neuropathic  pain Continue gabapentin 400 mg tid  Barrett's esophagus Stable, continue pantoprazole and carafate  OA Continue mobic  Chronic depression Continue her cymbalta amd lexapro.   COPD Breathing stable, continue o2 by nasal canula and her bronchodilators, monitor  Constipation Continue colace 100 mg daily  HLD Continue statin  DM type 2 Check cbg bid. Check a1c. Continue glipizide-metformin Lab Results  Component Value Date   HGBA1C 6.0 05/03/2014    D/c clotrimazole for now and monitor. Pt has been on this since 04/2014 per records with no clear indication. Patient denies any hx of oral thrush  Candidal rash Continue nystatin cream and skin care   Goals of care: short term rehabilitation   Labs/tests ordered: cbc, cmp, urine legionella antigen, a1c   Family/ staff Communication: reviewed care plan with patient, her daughter and nursing supervisor    Blanchie Serve, MD Internal Medicine [place], [place] [address] Cell Phone (Monday-Friday 8 am - 5 pm): [phone] On Call: [phone] and follow prompts after 5 pm and on weekends Office Phone: [phone] Office Fax: [phone]

## 2015-09-29 LAB — HEMOGLOBIN A1C: Hemoglobin A1C: 6.4

## 2015-09-29 LAB — CBC AND DIFFERENTIAL
HEMATOCRIT: 41 % (ref 36–46)
Hemoglobin: 12.1 g/dL (ref 12.0–16.0)
Platelets: 239 10*3/uL (ref 150–399)
WBC: 8.6 10^3/mL

## 2015-09-29 LAB — BASIC METABOLIC PANEL
BUN: 10 mg/dL (ref 4–21)
CREATININE: 0.6 mg/dL (ref 0.5–1.1)
GLUCOSE: 141 mg/dL
POTASSIUM: 4.7 mmol/L (ref 3.4–5.3)
SODIUM: 129 mmol/L — AB (ref 137–147)

## 2015-09-29 LAB — HEPATIC FUNCTION PANEL
ALK PHOS: 125 U/L (ref 25–125)
ALT: 14 U/L (ref 7–35)
AST: 16 U/L (ref 13–35)
BILIRUBIN, TOTAL: 0.3 mg/dL

## 2015-10-01 ENCOUNTER — Non-Acute Institutional Stay (SKILLED_NURSING_FACILITY): Payer: Medicare Other | Admitting: Family

## 2015-10-01 ENCOUNTER — Encounter: Payer: Self-pay | Admitting: Family

## 2015-10-01 DIAGNOSIS — E8809 Other disorders of plasma-protein metabolism, not elsewhere classified: Secondary | ICD-10-CM | POA: Diagnosis not present

## 2015-10-01 DIAGNOSIS — E871 Hypo-osmolality and hyponatremia: Secondary | ICD-10-CM

## 2015-10-01 DIAGNOSIS — E440 Moderate protein-calorie malnutrition: Secondary | ICD-10-CM | POA: Diagnosis not present

## 2015-10-01 NOTE — Progress Notes (Signed)
Location:  [facility] Room Number: 105 Place of Service:  SNF [phone]) Provider:  Marlowe Sax, FNP-C  [name], MD  Patient Care Team: Juluis Pitch, MD as PCP - General (Family Medicine) Alisa Graff, FNP as Nurse Practitioner (Family Medicine) Teodoro Spray, MD as Consulting Physician (Cardiology) Flora Lipps, MD as Consulting Physician (Pulmonary Disease)  Extended Emergency Contact Information Primary Emergency Contact: [name],[name] Address: [address]          [place], [place] [address] [name] of [place] Phone: [phone] Mobile Phone: [phone] Relation: Daughter Secondary Emergency Contact: Corado,[name] Address: [address]          [place], [place] [address] [name] of [place] Phone: [phone] Relation: Son   Code Status:  Full code Goals of care: Advanced Directive information Advanced Directives 10/01/2015  Does patient have an advance directive? No  Type of Advance Directive -  Does patient want to make changes to advanced directive? -  Copy of advanced directive(s) in chart? -  Would patient like information on creating an advanced directive? -     Chief Complaint  Patient presents with  . Acute Visit    HPI:  Pt is a 81 y.o. female seen today at [facility] for an acute visit for evaluation of abnormal lab results.She has a medical history of Type 2 DM, HTN, CHF, COPD, hyperlipidemia, obesity among other conditions. She is seen in her room today. She denies any acute issues this visit. Her recent lab results showed TP 5.8, Alb 3.39, Na 129, Hgb A1C 6.4 ( 09/29/2015). Facility Nurse reports no new concerns.    Past Medical History:  Diagnosis Date  . Breast cancer ([facility])   . CHF (congestive heart failure) ([facility])   . Chronic headache   . COPD (chronic obstructive pulmonary disease) ([facility])   . Diabetes mellitus   . Hyperlipidemia   . Hypertension    Past Surgical  History:  Procedure Laterality Date  . BREAST LUMPECTOMY  2007   left  . COLON SURGERY  1993  . TUBAL LIGATION  1971    No Known Allergies    Medication List       Accurate as of 10/01/15  4:33 PM. Always use your most recent med list.          acetaminophen 650 MG CR tablet Commonly known as:  TYLENOL Take 650 mg by mouth. Take one every 4 hours as needed for pain per standing ordered.   albuterol (2.5 MG/3ML) 0.083% nebulizer solution Commonly known as:  PROVENTIL Take 2.5 mg by nebulization every 4 (four) hours as needed.   aspirin 81 MG tablet Take 81 mg by mouth daily.   atorvastatin 10 MG tablet Commonly known as:  LIPITOR Take 10 mg by mouth at bedtime.   docusate sodium 100 MG capsule Commonly known as:  COLACE Take 100 mg by mouth daily.   DULoxetine 60 MG capsule Commonly known as:  CYMBALTA Take 60 mg by mouth daily.   escitalopram 20 MG tablet Commonly known as:  LEXAPRO Take 20 mg by mouth daily.   ferrous sulfate 325 (65 FE) MG tablet Take 325 mg by mouth daily with breakfast.   Fluticasone-Salmeterol 250-50 MCG/DOSE Aepb Commonly known as:  ADVAIR Inhale 1 puff into the lungs daily.   furosemide 20 MG tablet Commonly known as:  LASIX Take 2 tablets (40 mg total) by mouth daily.   gabapentin 400 MG  capsule Commonly known as:  NEURONTIN Take 400 mg by mouth 3 (three) times daily.   glipiZIDE-metformin 2.5-500 MG tablet Commonly known as:  METAGLIP Take 1 tablet by mouth 2 (two) times daily before a meal.   LORazepam 1 MG tablet Commonly known as:  ATIVAN Take 1 mg by mouth. Take one tablet every 8 hours as needed for anxiety   magnesium oxide 400 MG tablet Commonly known as:  MAG-OX Take 400 mg by mouth daily.   meloxicam 7.5 MG tablet Commonly known as:  MOBIC Take 7.5 mg by mouth daily.   metoprolol tartrate 25 MG tablet Commonly known as:  LOPRESSOR Take 25 mg by mouth 2 (two) times daily.   nystatin powder Generic drug:   nystatin Apply topically daily.   OXYGEN Inhale 3-4 L into the lungs. At all times for COPD   pantoprazole 40 MG tablet Commonly known as:  PROTONIX Take 40 mg by mouth 2 (two) times daily.   potassium chloride 10 MEQ tablet Commonly known as:  K-DUR,KLOR-CON Take 10 mEq by mouth 2 (two) times daily.   sucralfate 1 g tablet Commonly known as:  CARAFATE Take 1 g by mouth 4 (four) times daily -  with meals and at bedtime.   tamsulosin 0.4 MG Caps capsule Commonly known as:  FLOMAX Take 1 capsule (0.4 mg total) by mouth daily.   tiotropium 18 MCG inhalation capsule Commonly known as:  SPIRIVA Place 1 capsule (18 mcg total) into inhaler and inhale daily.   trandolapril 4 MG tablet Commonly known as:  MAVIK Take 2 mg by mouth 2 (two) times daily.   traZODone 100 MG tablet Commonly known as:  DESYREL Take 1 tablet (100 mg total) by mouth at bedtime.   vitamin B-12 1000 MCG tablet Commonly known as:  CYANOCOBALAMIN Take 1,000 mcg by mouth daily.       Review of Systems  Constitutional: Negative for activity change, appetite change, chills, fatigue and fever.  HENT: Negative for congestion, rhinorrhea, sinus pain, sinus pressure, sneezing and sore throat.   Eyes: Negative.   Respiratory: Negative for cough, choking, chest tightness, shortness of breath and wheezing.   Cardiovascular: Negative for chest pain, palpitations and leg swelling.  Gastrointestinal: Negative for abdominal distention, abdominal pain, constipation, diarrhea, nausea and vomiting.  Endocrine: Negative for cold intolerance, heat intolerance, polydipsia, polyphagia and polyuria.  Genitourinary: Negative for dysuria, flank pain, frequency and urgency.  Musculoskeletal: Positive for gait problem.  Skin: Negative for color change, pallor and rash.  Neurological: Negative for dizziness, seizures, light-headedness and headaches.  Psychiatric/Behavioral: Negative for agitation, confusion, hallucinations and  sleep disturbance. The patient is not nervous/anxious.     Immunization History  Administered Date(s) Administered  . Influenza Split 10/15/2013  . Influenza, High Dose Seasonal PF 12/05/2012  . Pneumococcal Polysaccharide-23 05/16/2010   Pertinent  Health Maintenance Due  Topic Date Due  . FOOT EXAM  03/23/1944  . OPHTHALMOLOGY EXAM  03/23/1944  . DEXA SCAN  03/22/2001  . PNA vac Low Risk Adult (2 of 2 - PCV13) 05/16/2011  . HEMOGLOBIN A1C  11/02/2014  . INFLUENZA VACCINE  08/05/2015   Fall Risk  09/09/2015  Falls in the past year? Yes  Number falls in past yr: 2 or more  Injury with Fall? No  Risk Factor Category  High Fall Risk  Risk for fall due to : History of fall(s)  Follow up Education provided    Vitals:   10/01/15 1619  BP: 135/68  Pulse: 80  Resp: 20  Temp: 98 F (36.7 C)  SpO2: 98%  Weight: 245 lb (111.1 kg)  Height: 4\' 11"  (1.499 m)   Body mass index is 49.48 kg/m. Physical Exam  Constitutional: She is oriented to person, place, and time. She appears well-developed. No distress.  Obese   HENT:  Head: Normocephalic.  Mouth/Throat: Oropharynx is clear and moist. No oropharyngeal exudate.  Eyes: Conjunctivae and EOM are normal. Pupils are equal, round, and reactive to light. Right eye exhibits no discharge. Left eye exhibits no discharge. No scleral icterus.  Neck: Normal range of motion. No JVD present. No thyromegaly present.  Cardiovascular: Normal rate, regular rhythm, normal heart sounds and intact distal pulses.  Exam reveals no gallop and no friction rub.   No murmur heard. Pulmonary/Chest: Effort normal and breath sounds normal. No respiratory distress. She has no wheezes. She has no rales.  Abdominal: Soft. Bowel sounds are normal. She exhibits no distension. There is no tenderness. There is no rebound and no guarding.  Musculoskeletal: She exhibits no edema, tenderness or deformity.  Moves x 4 extremities. Unsteady gait   Lymphadenopathy:     She has no cervical adenopathy.  Neurological: She is oriented to person, place, and time.  Skin: Skin is warm and dry. No rash noted. No erythema. No pallor.  Psychiatric: She has a normal mood and affect.    Labs reviewed:  Recent Labs  09/22/15 0543 09/23/15 0456 09/24/15 0422  NA 138 136 135  K 4.6 4.9 4.6  CL 95* 92* 91*  CO2 37* 37* 38*  GLUCOSE 115* 102* 101*  BUN 11 13 10   CREATININE 0.58 0.81 0.76  CALCIUM 7.5* 7.8* 7.5*    Recent Labs  08/15/15 1303  AST 12*  ALT 10*  ALKPHOS 108  BILITOT 0.7  PROT 5.8*  ALBUMIN 3.1*    Recent Labs  09/21/15 1306 09/22/15 0543 09/23/15 0456  WBC 5.4 4.7 5.1  NEUTROABS 4.0  --   --   HGB 10.8* 10.2* 11.2*  HCT 33.4* 31.7* 34.7*  MCV 87.7 86.9 88.7  PLT 156 146* 169   Lab Results  Component Value Date   TSH 3.338 08/21/2015   Lab Results  Component Value Date   HGBA1C 6.0 05/03/2014   Lab Results  Component Value Date   CHOL 119 08/16/2015   HDL 44 08/16/2015   LDLCALC 58 08/16/2015   TRIG 87 08/16/2015   CHOLHDL 2.7 08/16/2015   Assessment/Plan Moderate protein malnutrition  TP 5.8 ( 09/29/2015). Encourage oral intake. RD consult for protein supplements. Monitor BMP.   Hypoalbuminemia   Alb 3.39 ( 09/29/2015).RD consult for supplements. Monitor BMP   Hyponatremia   Na 129  ( 09/29/2015).Possible    Family/ staff Communication: Reviewed plan of care with patient and facility Nurse supervisor.   Labs/tests ordered: None

## 2015-10-02 ENCOUNTER — Telehealth: Payer: Self-pay | Admitting: Internal Medicine

## 2015-10-02 NOTE — Telephone Encounter (Signed)
Ria Comment with [facility] called and stated that after multiple phone messages and letter mailed to patient to contact [facility] to schedule Study. Patient has failed to do so.    Just FYI. Rhonda J Cobb

## 2015-10-07 ENCOUNTER — Ambulatory Visit: Payer: Medicare Other | Attending: Family | Admitting: Family

## 2015-10-07 ENCOUNTER — Encounter: Payer: Self-pay | Admitting: Family

## 2015-10-07 VITALS — BP 110/66 | HR 70 | Resp 18 | Ht 59.0 in | Wt 240.0 lb

## 2015-10-07 DIAGNOSIS — Z79899 Other long term (current) drug therapy: Secondary | ICD-10-CM | POA: Insufficient documentation

## 2015-10-07 DIAGNOSIS — Z853 Personal history of malignant neoplasm of breast: Secondary | ICD-10-CM | POA: Insufficient documentation

## 2015-10-07 DIAGNOSIS — J449 Chronic obstructive pulmonary disease, unspecified: Secondary | ICD-10-CM | POA: Diagnosis not present

## 2015-10-07 DIAGNOSIS — J438 Other emphysema: Secondary | ICD-10-CM

## 2015-10-07 DIAGNOSIS — E785 Hyperlipidemia, unspecified: Secondary | ICD-10-CM | POA: Insufficient documentation

## 2015-10-07 DIAGNOSIS — E119 Type 2 diabetes mellitus without complications: Secondary | ICD-10-CM | POA: Diagnosis not present

## 2015-10-07 DIAGNOSIS — Z87891 Personal history of nicotine dependence: Secondary | ICD-10-CM | POA: Insufficient documentation

## 2015-10-07 DIAGNOSIS — Z9889 Other specified postprocedural states: Secondary | ICD-10-CM | POA: Diagnosis not present

## 2015-10-07 DIAGNOSIS — Z7982 Long term (current) use of aspirin: Secondary | ICD-10-CM | POA: Diagnosis not present

## 2015-10-07 DIAGNOSIS — I10 Essential (primary) hypertension: Secondary | ICD-10-CM

## 2015-10-07 DIAGNOSIS — I110 Hypertensive heart disease with heart failure: Secondary | ICD-10-CM | POA: Diagnosis present

## 2015-10-07 DIAGNOSIS — E1140 Type 2 diabetes mellitus with diabetic neuropathy, unspecified: Secondary | ICD-10-CM

## 2015-10-07 DIAGNOSIS — I5032 Chronic diastolic (congestive) heart failure: Secondary | ICD-10-CM

## 2015-10-07 NOTE — Patient Instructions (Addendum)
Continue weighing daily and call for an overnight weight gain of > 2 pounds or a weekly weight gain of >5 pounds. 

## 2015-10-07 NOTE — Progress Notes (Signed)
Patient ID: Kathryn Hayes, female    DOB: 08/05/1934, 81 y.o.   MRN: [id number]  HPI  Ms Maggert is a 81 y/o female with a history of COPD, diabetes, hyperlipidemia, HTN, breast cancer and heart failure with a preserved ejection fraction.   Last echocardiogram was done 08/16/15 which showed an EF of 45-50%. No valvular abnormalities.   Last admission at [facility] was 09/21/15 for a UTI and exacerbation of her heart failure. She was diuresed and treated with antibiotics. Oxygen was continued.   Today she presents for a follow-up visit with fatigue and shortness of breath with little exertion. Just standing on the scale to be weighed got her winded but she does recover quickly. No swelling in her legs. Does get weighed every morning at [facility] but she's unsure of what her weight has been. Does wear her oxygen at 4L around the clock along with CPAP on a nightly basis. Is receiving physical therapy on a daily basis.     Past Medical History:  Diagnosis Date  . Breast cancer ([place])   . CHF (congestive heart failure) ([place])   . Chronic headache   . COPD (chronic obstructive pulmonary disease) ([place])   . Diabetes mellitus   . Hyperlipidemia   . Hypertension     Past Surgical History:  Procedure Laterality Date  . BREAST LUMPECTOMY  2007   left  . COLON SURGERY  1993  . TUBAL LIGATION  1971    Family History  Problem Relation Age of Onset  . Factor V Leiden deficiency Sister   . Breast cancer Mother   . Colon cancer Mother     Social History  Substance Use Topics  . Smoking status: Former Smoker    Packs/day: 1.50    Years: 50.00    Types: Cigarettes    Quit date: 10/05/2010  . Smokeless tobacco: Never Used  . Alcohol use No    No Known Allergies  Prior to Admission medications   Medication Sig Start Date End Date Taking? Authorizing Provider  acetaminophen (TYLENOL) 650 MG CR tablet Take 650 mg by mouth. Take one every 4 hours as needed for pain per standing ordered.   Yes  Historical Provider, MD  albuterol (PROVENTIL) (2.5 MG/3ML) 0.083% nebulizer solution Take 2.5 mg by nebulization every 4 (four) hours as needed.   Yes Historical Provider, MD  aspirin 81 MG tablet Take 81 mg by mouth daily.   Yes Historical Provider, MD  atorvastatin (LIPITOR) 10 MG tablet Take 10 mg by mouth at bedtime.   Yes Historical Provider, MD  docusate sodium (COLACE) 100 MG capsule Take 100 mg by mouth daily.    Yes Historical Provider, MD  DULoxetine (CYMBALTA) 60 MG capsule Take 60 mg by mouth daily.   Yes Historical Provider, MD  escitalopram (LEXAPRO) 20 MG tablet Take 20 mg by mouth daily.   Yes Historical Provider, MD  ferrous sulfate 325 (65 FE) MG tablet Take 325 mg by mouth daily with breakfast.   Yes Historical Provider, MD  Fluticasone-Salmeterol (ADVAIR) 250-50 MCG/DOSE AEPB Inhale 1 puff into the lungs daily.    Yes Historical Provider, MD  furosemide (LASIX) 20 MG tablet Take 2 tablets (40 mg total) by mouth daily. 09/25/15  Yes Vipul Manuella Ghazi, MD  gabapentin (NEURONTIN) 400 MG capsule Take 400 mg by mouth 3 (three) times daily.   Yes Historical Provider, MD  glipiZIDE-metformin (METAGLIP) 2.5-500 MG tablet Take 1 tablet by mouth 2 (two) times daily before a  meal.   Yes Historical Provider, MD  LORazepam (ATIVAN) 1 MG tablet Take 1 mg by mouth. Take one tablet every 8 hours as needed for anxiety   Yes Historical Provider, MD  magnesium oxide (MAG-OX) 400 MG tablet Take 400 mg by mouth daily.   Yes Historical Provider, MD  meloxicam (MOBIC) 7.5 MG tablet Take 7.5 mg by mouth daily.   Yes Historical Provider, MD  metoprolol tartrate (LOPRESSOR) 25 MG tablet Take 25 mg by mouth 2 (two) times daily.   Yes Historical Provider, MD  nystatin (NYSTATIN) powder Apply topically daily.    Yes Historical Provider, MD  OXYGEN Inhale 3-4 L into the lungs. At all times for COPD   Yes Historical Provider, MD  pantoprazole (PROTONIX) 40 MG tablet Take 40 mg by mouth 2 (two) times daily.    Yes  Historical Provider, MD  potassium chloride (K-DUR,KLOR-CON) 10 MEQ tablet Take 10 mEq by mouth 2 (two) times daily.   Yes Historical Provider, MD  sucralfate (CARAFATE) 1 G tablet Take 1 g by mouth 4 (four) times daily -  with meals and at bedtime.   Yes Historical Provider, MD  tamsulosin (FLOMAX) 0.4 MG CAPS capsule Take 1 capsule (0.4 mg total) by mouth daily. 08/25/15  Yes Srikar Sudini, MD  tiotropium (SPIRIVA) 18 MCG inhalation capsule Place 1 capsule (18 mcg total) into inhaler and inhale daily. 12/05/12  Yes Juanito Doom, MD  trandolapril (MAVIK) 4 MG tablet Take 2 mg by mouth 2 (two) times daily.   Yes Historical Provider, MD  traZODone (DESYREL) 100 MG tablet Take 1 tablet (100 mg total) by mouth at bedtime. 08/25/15  Yes Srikar Sudini, MD  vitamin B-12 (CYANOCOBALAMIN) 1000 MCG tablet Take 1,000 mcg by mouth daily.   Yes Historical Provider, MD     Review of Systems  Constitutional: Positive for fatigue. Negative for appetite change.  HENT: Negative for congestion, postnasal drip and sore throat.   Eyes: Negative.   Respiratory: Positive for cough and shortness of breath. Negative for chest tightness and wheezing.   Cardiovascular: Negative for chest pain, palpitations and leg swelling.  Gastrointestinal: Negative for abdominal distention and abdominal pain.  Endocrine: Negative.   Genitourinary: Negative.   Musculoskeletal: Negative for back pain and neck pain.  Skin: Negative.   Allergic/Immunologic: Negative.   Neurological: Positive for light-headedness. Negative for dizziness.  Hematological: Negative for adenopathy. Bruises/bleeds easily.  Psychiatric/Behavioral: Positive for dysphoric mood. Negative for sleep disturbance (wearing CPAP and oxygen at 4L). The patient is not nervous/anxious.     Vitals:   10/07/15 1404  BP: 110/66  Pulse: 70  Resp: 18  SpO2: 96%  Weight: 240 lb (108.9 kg)  Height: 4\' 11"  (1.499 m)     Physical Exam  Constitutional: She  appears well-developed and well-nourished.  HENT:  Head: Normocephalic and atraumatic.  Eyes: Conjunctivae are normal. Pupils are equal, round, and reactive to light.  Neck: Normal range of motion. Neck supple.  Cardiovascular: Normal rate and regular rhythm.   Pulmonary/Chest: Effort normal. She has no wheezes. She has rhonchi in the right upper field and the left upper field. She has no rales.  Abdominal: Soft. She exhibits no distension. There is no tenderness.  Musculoskeletal: She exhibits no edema or tenderness.  Skin: Skin is warm and dry.  Psychiatric: She has a normal mood and affect. Her behavior is normal. Thought content normal.  Nursing note and vitals reviewed.  Assessment & Plan:  1: Chronic heart failure  with preserved ejection fraction- - NYHA Class 3 - Euvolemic today - Continue daily weights at facility - Continue oxygen at 4L around the clock - Hasn't seen cardiology since 09/26/14. If she hasn't seen them by the time she returns, will need to schedule an appointment w/ Dr. Bethanne Ginger office.  2: HTN- -  BP looks great today - Saw PCP on 09/19/15  3: COPD- - Has rhonchi in bilateral upper lobes - + loose cough - Encouraged use of her inhalers 1-2 times daily to keep the cough loose. Hasn't been requesting nebulizer.  - Wearing CPAP nightly  4: Diabetes-  - Reports glucose in the 120's - Taking glipizide-metformin - Doing daily exercises with PT   Return here in 2 months or sooner for any questions/problems before then.

## 2015-10-14 ENCOUNTER — Other Ambulatory Visit: Payer: Self-pay | Admitting: *Deleted

## 2015-10-14 MED ORDER — LORAZEPAM 1 MG PO TABS: ORAL_TABLET | ORAL | 0 refills | Status: DC

## 2015-10-14 NOTE — Telephone Encounter (Signed)
[facility] [phone] Fax: [phone]  

## 2015-10-31 ENCOUNTER — Encounter: Payer: Self-pay | Admitting: Family

## 2015-10-31 ENCOUNTER — Non-Acute Institutional Stay (SKILLED_NURSING_FACILITY): Payer: Medicare Other | Admitting: Family

## 2015-10-31 DIAGNOSIS — I5022 Chronic systolic (congestive) heart failure: Secondary | ICD-10-CM | POA: Diagnosis not present

## 2015-10-31 DIAGNOSIS — J449 Chronic obstructive pulmonary disease, unspecified: Secondary | ICD-10-CM

## 2015-10-31 NOTE — Progress Notes (Signed)
Location: [facility] Room Number: 105  Place of Service:  SNF (31) Provider:  Marlowe Sax, FNP-C  [name], MD  Patient Care Team: Juluis Pitch, MD as PCP - General (Family Medicine) Alisa Graff, FNP as Nurse Practitioner (Family Medicine) Teodoro Spray, MD as Consulting Physician (Cardiology) Flora Lipps, MD as Consulting Physician (Pulmonary Disease)  Extended Emergency Contact Information Primary Emergency Contact: [name],[name] Address: [address]          [place], [place] [address] [name] of [place] Phone: [phone] Mobile Phone: [phone] Relation: Daughter Secondary Emergency Contact: Demchak,[name] Address: [address]          [place], [place] [address] [name] of [place] Phone: [phone] Relation: Son  Code Status:  Full Code  Goals of care: Advanced Directive information Advanced Directives 10/31/2015  Does patient have an advance directive? No  Type of Advance Directive -  Does patient want to make changes to advanced directive? -  Copy of advanced directive(s) in chart? -  Would patient like information on creating an advanced directive? -     Chief Complaint  Patient presents with  . Acute Visit    Cough    HPI:  Pt is a 81 y.o. female seen today at [facility] for an acute visit for evaluation of worsening cough.She is status post hospital admission from 09/21/15-09/25/15 post fall with UTI and acute CHF exacerbation. She was started on antibiotics and diuresed.She completed a course of Levaquin 750 mg Tablet 10/21/2015. She has a significant medical history of COPD, CHF, HTN, Type 2 DM, Hyperlipidemia among other conditions. She is seen in her room today. She complains of worsening cough despite completion of antibiotics and use of cough medication. She also shortness of breath with exertion. She continues to work with PT/OT. She denies any fever or chills.     Past  Medical History:  Diagnosis Date  . Breast cancer ([place])   . CHF (congestive heart failure) ([place])   . Chronic headache   . COPD (chronic obstructive pulmonary disease) ([place])   . Diabetes mellitus   . Hyperlipidemia   . Hypertension    Past Surgical History:  Procedure Laterality Date  . BREAST LUMPECTOMY  2007   left  . COLON SURGERY  1993  . TUBAL LIGATION  1971    No Known Allergies    Medication List       Accurate as of 10/31/15 11:59 PM. Always use your most recent med list.          acetaminophen 650 MG CR tablet Commonly known as:  TYLENOL Take 650 mg by mouth. Take one every 4 hours as needed for pain per standing ordered.   albuterol (2.5 MG/3ML) 0.083% nebulizer solution Commonly known as:  PROVENTIL Take 2.5 mg by nebulization every 4 (four) hours as needed for wheezing or shortness of breath.   aspirin 81 MG tablet Take 81 mg by mouth daily.   atorvastatin 10 MG tablet Commonly known as:  LIPITOR Take 10 mg by mouth at bedtime.   docusate sodium 100 MG capsule Commonly known as:  COLACE Take 100 mg by mouth daily.   DULoxetine 60 MG capsule Commonly known as:  CYMBALTA Take 60 mg by mouth daily.   escitalopram 20 MG tablet Commonly known as:  LEXAPRO Take 20 mg by mouth daily.   feeding supplement (PRO-STAT SUGAR FREE 64) Liqd Take 30 mLs by mouth daily.  ferrous sulfate 325 (65 FE) MG tablet Take 325 mg by mouth daily with breakfast.   Fluticasone-Salmeterol 250-50 MCG/DOSE Aepb Commonly known as:  ADVAIR Inhale 1 puff into the lungs every 12 (twelve) hours.   furosemide 40 MG tablet Commonly known as:  LASIX Take 40 mg by mouth daily.   gabapentin 400 MG capsule Commonly known as:  NEURONTIN Take 400 mg by mouth 3 (three) times daily.   glipiZIDE-metformin 2.5-500 MG tablet Commonly known as:  METAGLIP Take 1 tablet by mouth 2 (two) times daily before a meal.   guaiFENesin-dextromethorphan 100-10 MG/5ML syrup Commonly known  as:  ROBITUSSIN DM Take 10 mLs by mouth every 6 (six) hours as needed for cough. Standing order, unable to expectorate well   LORazepam 1 MG tablet Commonly known as:  ATIVAN Take 1 mg by mouth every 8 (eight) hours. Hold for sedation   magnesium oxide 400 MG tablet Commonly known as:  MAG-OX Take 400 mg by mouth daily.   meloxicam 7.5 MG tablet Commonly known as:  MOBIC Take 7.5 mg by mouth daily.   metoprolol tartrate 25 MG tablet Commonly known as:  LOPRESSOR Take 25 mg by mouth 2 (two) times daily.   nystatin powder Commonly known as:  MYCOSTATIN/NYSTOP Apply 1 application topically to abdominal folds daily for redness and irritation.   OXYGEN Inhale 4 L into the lungs continuous. At all times for COPD   pantoprazole 40 MG tablet Commonly known as:  PROTONIX Take 40 mg by mouth 2 (two) times daily.   potassium chloride 10 MEQ tablet Commonly known as:  K-DUR,KLOR-CON Take 10 mEq by mouth 2 (two) times daily.   sucralfate 1 g tablet Commonly known as:  CARAFATE Take 1 g by mouth 4 (four) times daily -  with meals and at bedtime.   THROAT LOZENGES MT Use as directed in the mouth or throat. Take every 6 hours as needed for dry mouth.   tiotropium 18 MCG inhalation capsule Commonly known as:  SPIRIVA Place 18 mcg into inhaler and inhale 2 (two) times daily.   trandolapril 4 MG tablet Commonly known as:  MAVIK Take 2 mg by mouth 2 (two) times daily.   traZODone 100 MG tablet Commonly known as:  DESYREL Take 1 tablet (100 mg total) by mouth at bedtime.   vitamin B-12 1000 MCG tablet Commonly known as:  CYANOCOBALAMIN Take 1,000 mcg by mouth daily.       Review of Systems  Constitutional: Negative for activity change, appetite change, chills, fatigue and fever.  HENT: Negative for congestion, rhinorrhea, sinus pressure and sneezing.   Eyes: Negative.   Respiratory: Positive for cough. Negative for chest tightness and wheezing.        Shortness of breath  with exertion   Cardiovascular: Negative for chest pain, palpitations and leg swelling.  Gastrointestinal: Negative for abdominal distention, abdominal pain, constipation, diarrhea, nausea and vomiting.  Genitourinary: Negative for dysuria, frequency and urgency.  Musculoskeletal: Positive for gait problem.  Skin: Negative for color change, pallor and rash.  Psychiatric/Behavioral: Negative for agitation, confusion, hallucinations and sleep disturbance. The patient is not nervous/anxious.     Immunization History  Administered Date(s) Administered  . Influenza Split 10/15/2013  . Influenza, High Dose Seasonal PF 12/05/2012  . PPD Test 10/09/2015  . Pneumococcal Polysaccharide-23 05/16/2010   Pertinent  Health Maintenance Due  Topic Date Due  . FOOT EXAM  03/23/1944  . OPHTHALMOLOGY EXAM  03/[birth year]/1946  . DEXA SCAN  03/22/2001  .  INFLUENZA VACCINE  04/03/2016 (Originally 08/05/2015)  . PNA vac Low Risk Adult (2 of 2 - PCV13) 04/03/2016 (Originally 05/16/2011)  . HEMOGLOBIN A1C  03/28/2016   Fall Risk  10/07/2015 09/09/2015  Falls in the past year? Yes Yes  Number falls in past yr: 2 or more 2 or more  Injury with Fall? No No  Risk Factor Category  - High Fall Risk  Risk for fall due to : - History of fall(s)  Follow up Education provided;Falls evaluation completed Education provided   Functional Status Survey:    Vitals:   10/31/15 1152  BP: 131/68  Pulse: 70  Resp: 16  Temp: (!) 96.4 F (35.8 C)  SpO2: 96%  Weight: 238 lb 1.6 oz (108 kg)  Height: 4\' 11"  (1.499 m)   Body mass index is 48.09 kg/m. Physical Exam  Constitutional: She is oriented to person, place, and time. She appears well-developed.  obese  HENT:  Head: Normocephalic.  Mouth/Throat: Oropharynx is clear and moist. No oropharyngeal exudate.  Eyes: Conjunctivae and EOM are normal. Pupils are equal, round, and reactive to light. Right eye exhibits no discharge. Left eye exhibits no discharge. No scleral  icterus.  Neck: Normal range of motion. No JVD present. No thyromegaly present.  Cardiovascular: Normal rate, regular rhythm and intact distal pulses.  Exam reveals no gallop and no friction rub.   No murmur heard. Pulmonary/Chest: Effort normal. No respiratory distress. She has no wheezes.  Left upper lobe crackles clears with cough.   Abdominal: Soft. Bowel sounds are normal. She exhibits no distension. There is no tenderness. There is no rebound and no guarding.  Musculoskeletal: Normal range of motion. She exhibits no edema, tenderness or deformity.  Unsteady gait   Lymphadenopathy:    She has no cervical adenopathy.  Neurological: She is oriented to person, place, and time.  Skin: Skin is warm and dry. No rash noted. No erythema. No pallor.  Psychiatric: She has a normal mood and affect.    Labs reviewed:  Recent Labs  09/22/15 0543 09/23/15 0456 09/24/15 0422 09/29/15  NA 138 136 135 129*  K 4.6 4.9 4.6 4.7  CL 95* 92* 91*  --   CO2 37* 37* 38*  --   GLUCOSE 115* 102* 101*  --   BUN 11 13 10 10   CREATININE 0.58 0.81 0.76 0.6  CALCIUM 7.5* 7.8* 7.5*  --     Recent Labs  08/15/15 1303 09/29/15  AST 12* 16  ALT 10* 14  ALKPHOS 108 125  BILITOT 0.7  --   PROT 5.8*  --   ALBUMIN 3.1*  --     Recent Labs  09/21/15 1306 09/22/15 0543 09/23/15 0456 09/29/15  WBC 5.4 4.7 5.1 8.6  NEUTROABS 4.0  --   --   --   HGB 10.8* 10.2* 11.2* 12.1  HCT 33.4* 31.7* 34.7* 41  MCV 87.7 86.9 88.7  --   PLT 156 146* 169 239   Lab Results  Component Value Date   TSH 3.338 08/21/2015   Lab Results  Component Value Date   HGBA1C 6.4 09/29/2015   Lab Results  Component Value Date   CHOL 119 08/16/2015   HDL 44 08/16/2015   LDLCALC 58 08/16/2015   TRIG 87 08/16/2015   CHOLHDL 2.7 08/16/2015   Assessment/Plan COPD Afebrile. Worsening non-productive cough.continue on Albuterol, Advair and Spiriva. Continuous oxygen 4 Liters via [place]. Will obtain portable CXR PA /Lateral  rule out PNA  CHF No recent weight gain. Shortness of breath with exertion. Continue Furosemide 40 mg Tablet daily. Portable CXR PA/Lat as above.    Family/ staff Communication: Reviewed plan of care with patient and facility Nurse supervisor.   Labs/tests ordered: CXR PA /Lateral rule out PNA

## 2015-12-10 ENCOUNTER — Encounter: Payer: Self-pay | Admitting: Family

## 2015-12-10 ENCOUNTER — Ambulatory Visit (INDEPENDENT_AMBULATORY_CARE_PROVIDER_SITE_OTHER): Payer: Medicare Other | Admitting: Internal Medicine

## 2015-12-10 ENCOUNTER — Non-Acute Institutional Stay (SKILLED_NURSING_FACILITY): Payer: Medicare Other | Admitting: Family

## 2015-12-10 ENCOUNTER — Encounter: Payer: Self-pay | Admitting: Internal Medicine

## 2015-12-10 VITALS — BP 138/82 | HR 80 | Wt 225.0 lb

## 2015-12-10 DIAGNOSIS — E1100 Type 2 diabetes mellitus with hyperosmolarity without nonketotic hyperglycemic-hyperosmolar coma (NKHHC): Secondary | ICD-10-CM | POA: Diagnosis not present

## 2015-12-10 DIAGNOSIS — J449 Chronic obstructive pulmonary disease, unspecified: Secondary | ICD-10-CM

## 2015-12-10 DIAGNOSIS — I5022 Chronic systolic (congestive) heart failure: Secondary | ICD-10-CM | POA: Diagnosis not present

## 2015-12-10 DIAGNOSIS — I110 Hypertensive heart disease with heart failure: Secondary | ICD-10-CM

## 2015-12-10 DIAGNOSIS — E782 Mixed hyperlipidemia: Secondary | ICD-10-CM

## 2015-12-10 MED ORDER — PREDNISONE 20 MG PO TABS: ORAL_TABLET | ORAL | 0 refills | Status: DC

## 2015-12-10 MED ORDER — FLUTTER DEVI: 0 refills | Status: DC

## 2015-12-10 NOTE — Patient Instructions (Signed)
Will need to use flutter valve STart PRednisone 40 mg daily for 7 days Continue inhalers as -prescribed ALbuterol NEBS evey 4 hrs as needed   Bronchiectasis Bronchiectasis is a condition in which the airways (bronchi) are damaged and widened. This makes it difficult for the lungs to get rid of mucus. As a result, mucus gathers in the airways, and this often leads to lung infections. Infection can cause inflammation in the airways, which may further weaken and damage the bronchi. What are the causes? Bronchiectasis may be present at birth (congenital) or may develop later in life. Sometimes there is no apparent cause. Some common causes include:  Cystic fibrosis.  Recurrent lung infections (such as pneumonia, tuberculosis, or fungal infections).  Foreign bodies or other blockages in the lungs.  Breathing in fluid, food, or other foreign objects (aspiration). What are the signs or symptoms? Common symptoms include:  A daily cough that brings up mucus and lasts for more than 3 weeks.  Frequent lung infections (such as pneumonia, tuberculosis, or fungal infections).  Shortness of breath and wheezing.  Weakness and fatigue. How is this diagnosed? Various tests may be done to help diagnose bronchiectasis. Tests may include:  Chest X-rays or CT scans.  Breathing tests to help determine how your lungs are working.  Sputum cultures to check for infection.  Blood tests and other tests to check for related diseases or causes, such as cystic fibrosis. How is this treated? Treatment varies depending on the severity of the condition. Medicines may be given to loosen the mucus to be coughed up (expectorants), to relax the muscles of the air passages (bronchodilators), or to prevent or treat infections (antibiotics). Physical therapy methods may be recommended to help clear mucus from the lungs. For severe cases, surgery may be done to remove the affected part of the lung. Follow these  instructions at home:  Get plenty of rest.  Only take over-the-counter or prescription medicines as directed by your health care provider. If antibiotic medicines were prescribed, take them as directed. Finish them even if you start to feel better.  Avoid sedatives and antihistamines unless otherwise directed by your health care provider. These medicines tend to thicken the mucus in the lungs.  Perform any breathing exercises or techniques to clear the lungs as directed by your health care provider.  Drink enough fluids to keep your urine clear or pale yellow.  Consider using a cold steam vaporizer or humidifier in your room or home to help loosen secretions.  If the cough is worse at night, try sleeping in a semi-upright position in a recliner or using a couple of pillows.  Avoid cigarette smoke and lung irritants. If you smoke, quit.  Stay inside when pollution and ozone levels are high.  Stay current with vaccinations and immunizations.  Follow up with your health care provider as directed. Contact a health care provider if:  You cough up more thick, discolored mucus (sputum) that is yellow to green in color.  You have a fever or persistent symptoms for more than 2-3 days.  You cannot control your cough and are losing sleep. Get help right away if:  You cough up blood.  You have chest pain or increasing shortness of breath.  You have pain that is getting worse or is uncontrolled with medicines.  You have a fever and your symptoms suddenly get worse. This information is not intended to replace advice given to you by your health care provider. Make sure you discuss any  questions you have with your health care provider. Document Released: 10/18/2006 Document Revised: 06/04/2015 Document Reviewed: 06/28/2012 Elsevier Interactive Patient Education  2017 [employer].

## 2015-12-10 NOTE — Progress Notes (Signed)
Synopsis: Kathryn Hayes was diagnosed with COPD in 2007 and came to the [facility] for the first time in 2013.  She has been hospitalized for a COPD flare in the past.  He uses 2 L O2 continuously.  12/2010 Full PFT [facility]: Ratio 70%, FEV1 1.2 L (65%) clear obstruction on flow volume loop, TLC normal, RV 169% DLCO 41%    HPI  Chief Complaint  Patient presents with  . Follow-up    SOb w/activity: prod cough w/yellow mucus; chest tightness   She has chronic resp failure, COPD, CHF  She has a pacemaker   She has some wheezing and rhonchi on exam today, was given abx 2 months ago She is taking lasix once a day and she says that her UOP is adequate.     She is chronically SOB has chronic DOE, morbidly obese Poor resp effort, no signs if infection at this time   Past Medical History:  Diagnosis Date  . Breast cancer ([place])   . CHF (congestive heart failure) ([place])   . Chronic headache   . COPD (chronic obstructive pulmonary disease) ([place])   . Diabetes mellitus   . Hyperlipidemia   . Hypertension     Review of Systems  Constitutional: Negative for chills, fatigue, fever and unexpected weight change.  HENT: Negative for congestion, nosebleeds, postnasal drip and rhinorrhea.   Respiratory: Positive for shortness of breath. Negative for cough, choking, chest tightness, wheezing and stridor.   Cardiovascular: Positive for leg swelling. Negative for chest pain and palpitations.  Gastrointestinal: Negative for abdominal distention and abdominal pain.  Musculoskeletal: Positive for arthralgias.  Neurological: Negative for dizziness and headaches.  All other systems reviewed and are negative.     Objective:  chronically ill appearing female in wheelchair  Physical Exam  Constitutional: She is oriented to person, place, and time. No distress.  HENT:  Mouth/Throat: No oropharyngeal exudate.  Eyes: No scleral icterus.  Neck: Neck supple.  Cardiovascular: Normal rate, regular rhythm  and normal heart sounds.   No murmur heard. Pulmonary/Chest: Effort normal. No stridor. No respiratory distress. She has wheezes. She has rales.  Musculoskeletal: Normal range of motion. She exhibits edema.  Neurological: She is alert and oriented to person, place, and time. No cranial nerve deficit.  Skin: Skin is warm. She is not diaphoretic.  Psychiatric: She has a normal mood and affect.    Vitals:   12/10/15 0904  BP: 138/82  BP Location: Left Arm  Cuff Size: Normal  Pulse: 80  SpO2: 93%  Weight: 225 lb (102.1 kg)  3 L nasal cannula  O2 saturation with 2L > 94% at rest; with walking on 3 L > 94%    Review of January 2013 CXR : hyperinflation, increased pulmonary vascularity  CT Chest January 2013: no pe, no clear infiltrate or significant emphysema  12/2010 Full PFT [facility]: Ratio 70%, FEV1 1.2 L (65%) clear obstruction on flow volume loop, TLC normal, RV 169% DLCO 41%  February 2016 chest x-ray images reviewed bilateral pleural effusion, bilateral airspace disease likely consistent with pulmonary edema April 2016 chest x-ray images reviewed there has been improvement in the bilateral pleural effusions and pulmonary edema, there is now a pacemaker in place April 2016 lab work reviewed> surprisingly normal kidney function, hemoglobin 11.1  Assessment & Plan:   81 yo morbidly obese white female with chronic resp failure from severe COPD and CHF, prognosis is very poor.   COPD (chronic obstructive pulmonary disease)-end stage Gold Stage  D 1.Continue Spiriva 2.cotninue advair 3.continue oxygen 2L [place] 4.albuterol neds every 4 hrs as needed 5.flutter valve daily 6.start prednisone 40 mg daily for 7 days   Hypoxemic respiratory failure, chronic -She should continue 2 L at rest and 3 L with exertion -high chance of recurrent admissions, prognosis is very poor   Congestive heart failure-seems to be euvolumic state at thistime 1.Continue metoprolol, Lasix 2.follow up  Cardiology   Follow up in 3 months   The Patient requires high complexity decision making for assessment and support, frequent evaluation and titration of therapies. Patient  satisfied with Plan of action and management. All questions answered  Corrin Parker, M.D.  [facility]  Medical Director [place] Director [facility] Cardio-Pulmonary Department

## 2015-12-10 NOTE — Addendum Note (Signed)
Addended by: Oscar La R on: 12/10/2015 09:25 AM   Modules accepted: Orders

## 2015-12-10 NOTE — Progress Notes (Signed)
Location:  [place] Room Number: 306-B Place of Service:  SNF (31) Provider:  Dinah Ngetich FNP-C   [name], MD  Patient Care Team: Juluis Pitch, MD as PCP - General (Family Medicine) Alisa Graff, FNP as Nurse Practitioner (Family Medicine) Teodoro Spray, MD as Consulting Physician (Cardiology) Flora Lipps, MD as Consulting Physician (Pulmonary Disease)  Extended Emergency Contact Information Primary Emergency Contact: [name],[name] Address: [address]          [place], [place] [address] [name] of [place] Phone: [phone] Mobile Phone: [phone] Relation: Daughter Secondary Emergency Contact: Mccaughey,[name] Address: [address]          [place], [place] [address] [name] of [place] Phone: [phone] Relation: Son  Code Status:  Full Code  Goals of care: Advanced Directive information Advanced Directives 10/31/2015  Does Patient Have a Medical Advance Directive? No  Type of Advance Directive -  Does patient want to make changes to medical advance directive? -  Copy of [place] in Chart? -  Would patient like information on creating a medical advance directive? -     Chief Complaint  Patient presents with  . Medical Management of Chronic Issues    Routine Visit    HPI:  Pt is a 81 y.o. female seen today at [facility] for medical management of chronic diseases. She has a medical history of HTN, CHF, Type 2 DM, COPD, Hyperlipidemia, Depression among other conditions. She is seen in her room today. She continues to report chronic shortness of breath. She was seen by Pulmonary specialist Dr. Flora Lipps for COPD. Prednisone 40 mg Tablet x 7 days and Flutter valve ordered. She was also seen by [facility] Failure 10/07/2015 Nebs ordered for congestion and for follow up 12/15/2015.  She reports coughing up whitish-yellowish sputum. She denies any fever or chills.Her  CBG's ranging in the 80's-150's with x 1 episode recorded as high. Facility Nurse reports no new concerns.    Past Medical History:  Diagnosis Date  . Breast cancer ([place])   . CHF (congestive heart failure) ([place])   . Chronic headache   . COPD (chronic obstructive pulmonary disease) ([place])   . Diabetes mellitus   . Hyperlipidemia   . Hypertension    Past Surgical History:  Procedure Laterality Date  . BREAST LUMPECTOMY  2007   left  . COLON SURGERY  1993  . TUBAL LIGATION  1971    No Known Allergies    Medication List       Accurate as of 12/10/15 10:33 AM. Always use your most recent med list.          acetaminophen 650 MG CR tablet Commonly known as:  TYLENOL Take 650 mg by mouth. Take one every 4 hours as needed for pain per standing ordered.   albuterol (2.5 MG/3ML) 0.083% nebulizer solution Commonly known as:  PROVENTIL Take 2.5 mg by nebulization every 4 (four) hours as needed for wheezing or shortness of breath.   aspirin 81 MG tablet Take 81 mg by mouth daily.   atorvastatin 10 MG tablet Commonly known as:  LIPITOR Take 10 mg by mouth at bedtime.   docusate sodium 100 MG capsule Commonly known as:  COLACE Take 100 mg by mouth daily.   DULoxetine 60 MG capsule Commonly known as:  CYMBALTA Take 60 mg by mouth daily.   escitalopram 20 MG tablet Commonly known as:  LEXAPRO  Take 20 mg by mouth daily.   ferrous sulfate 325 (65 FE) MG tablet Take 325 mg by mouth daily with breakfast.   Fluticasone-Salmeterol 250-50 MCG/DOSE Aepb Commonly known as:  ADVAIR Inhale 1 puff into the lungs every 12 (twelve) hours.   FLUTTER Devi Use as directed   furosemide 40 MG tablet Commonly known as:  LASIX Take 40 mg by mouth daily.   gabapentin 400 MG capsule Commonly known as:  NEURONTIN Take 400 mg by mouth 3 (three) times daily.   glipiZIDE-metformin 2.5-500 MG tablet Commonly known as:  METAGLIP Take 1 tablet by mouth 2 (two) times daily before a  meal.   LORazepam 1 MG tablet Commonly known as:  ATIVAN Take 1 mg by mouth every 8 (eight) hours as needed. Hold for sedation   magnesium oxide 400 MG tablet Commonly known as:  MAG-OX Take 400 mg by mouth daily.   meloxicam 7.5 MG tablet Commonly known as:  MOBIC Take 7.5 mg by mouth daily.   metoprolol tartrate 25 MG tablet Commonly known as:  LOPRESSOR Take 25 mg by mouth 2 (two) times daily.   nystatin powder Commonly known as:  MYCOSTATIN/NYSTOP Apply 1 application topically to abdominal folds daily for redness and irritation.   OXYGEN Inhale 4 L into the lungs continuous. At all times for COPD   pantoprazole 40 MG tablet Commonly known as:  PROTONIX Take 40 mg by mouth 2 (two) times daily.   potassium chloride 10 MEQ tablet Commonly known as:  K-DUR,KLOR-CON Take 10 mEq by mouth 2 (two) times daily.   sucralfate 1 g tablet Commonly known as:  CARAFATE Take 1 g by mouth 4 (four) times daily -  with meals and at bedtime.   THROAT LOZENGES MT Use as directed in the mouth or throat. Take every 6 hours as needed for dry mouth.   tiotropium 18 MCG inhalation capsule Commonly known as:  SPIRIVA Place 18 mcg into inhaler and inhale 2 (two) times daily.   trandolapril 4 MG tablet Commonly known as:  MAVIK Take 2 mg by mouth 2 (two) times daily.   traZODone 100 MG tablet Commonly known as:  DESYREL Take 1 tablet (100 mg total) by mouth at bedtime.   vitamin B-12 1000 MCG tablet Commonly known as:  CYANOCOBALAMIN Take 1,000 mcg by mouth daily.       Review of Systems  Constitutional: Negative for activity change, appetite change, chills, fatigue and fever.  HENT: Negative for congestion, rhinorrhea, sinus pressure and sneezing.   Eyes: Negative.   Respiratory: Negative for chest tightness and wheezing.        Shortness of breath with exertion. Whitish-yellow sputum.   Cardiovascular: Negative for chest pain, palpitations and leg swelling.   Gastrointestinal: Negative for abdominal distention, abdominal pain, constipation, diarrhea, nausea and vomiting.  Endocrine: Negative.   Genitourinary: Negative for dysuria, frequency and urgency.  Musculoskeletal: Positive for gait problem.  Skin: Negative for color change, pallor and rash.  Neurological: Negative for dizziness, seizures, syncope, light-headedness and headaches.  Hematological: Does not bruise/bleed easily.  Psychiatric/Behavioral: Negative for agitation, confusion, hallucinations and sleep disturbance. The patient is not nervous/anxious.     Immunization History  Administered Date(s) Administered  . Influenza Split 10/15/2013  . Influenza, High Dose Seasonal PF 12/05/2012  . PPD Test 10/09/2015  . Pneumococcal Polysaccharide-23 05/16/2010   Pertinent  Health Maintenance Due  Topic Date Due  . FOOT EXAM  03/23/1944  . OPHTHALMOLOGY EXAM  03/23/1944  .  DEXA SCAN  03/22/2001  . INFLUENZA VACCINE  04/03/2016 (Originally 08/05/2015)  . PNA vac Low Risk Adult (2 of 2 - PCV13) 04/03/2016 (Originally 05/16/2011)  . HEMOGLOBIN A1C  03/28/2016   Fall Risk  10/07/2015 09/09/2015  Falls in the past year? Yes Yes  Number falls in past yr: 2 or more 2 or more  Injury with Fall? No No  Risk Factor Category  - High Fall Risk  Risk for fall due to : - History of fall(s)  Follow up Education provided;Falls evaluation completed Education provided      Vitals:   12/10/15 1025  BP: (!) 142/80  Pulse: 71  Resp: 20  Temp: 98 F (36.7 C)  TempSrc: Oral  SpO2: 96%  Weight: 225 lb 3.2 oz (102.2 kg)  Height: 4\' 11"  (1.499 m)   Body mass index is 45.48 kg/m. Physical Exam  Constitutional: She is oriented to person, place, and time. She appears well-developed.  obese  HENT:  Head: Normocephalic.  Mouth/Throat: Oropharynx is clear and moist. No oropharyngeal exudate.  Eyes: Conjunctivae and EOM are normal. Pupils are equal, round, and reactive to light. Right eye exhibits no  discharge. Left eye exhibits no discharge. No scleral icterus.  Neck: Normal range of motion. No JVD present. No thyromegaly present.  Cardiovascular: Normal rate, regular rhythm and intact distal pulses.  Exam reveals no gallop and no friction rub.   No murmur heard. Pulmonary/Chest: Effort normal. No respiratory distress. She has wheezes.  Oxygen 2 liters [place] in place.   Abdominal: Soft. Bowel sounds are normal. She exhibits no distension. There is no tenderness. There is no rebound and no guarding.  Genitourinary:  Genitourinary Comments: Continent   Musculoskeletal: Normal range of motion. She exhibits no edema, tenderness or deformity.  Unsteady gait   Lymphadenopathy:    She has no cervical adenopathy.  Neurological: She is oriented to person, place, and time.  Skin: Skin is warm and dry. No rash noted. No erythema. No pallor.  Psychiatric: She has a normal mood and affect.    Labs reviewed:  Recent Labs  09/22/15 0543 09/23/15 0456 09/24/15 0422 09/29/15  NA 138 136 135 129*  K 4.6 4.9 4.6 4.7  CL 95* 92* 91*  --   CO2 37* 37* 38*  --   GLUCOSE 115* 102* 101*  --   BUN 11 13 10 10   CREATININE 0.58 0.81 0.76 0.6  CALCIUM 7.5* 7.8* 7.5*  --     Recent Labs  08/15/15 1303 09/29/15  AST 12* 16  ALT 10* 14  ALKPHOS 108 125  BILITOT 0.7  --   PROT 5.8*  --   ALBUMIN 3.1*  --     Recent Labs  09/21/15 1306 09/22/15 0543 09/23/15 0456 09/29/15  WBC 5.4 4.7 5.1 8.6  NEUTROABS 4.0  --   --   --   HGB 10.8* 10.2* 11.2* 12.1  HCT 33.4* 31.7* 34.7* 41  MCV 87.7 86.9 88.7  --   PLT 156 146* 169 239   Lab Results  Component Value Date   TSH 3.338 08/21/2015   Lab Results  Component Value Date   HGBA1C 6.4 09/29/2015   Lab Results  Component Value Date   CHOL 119 08/16/2015   HDL 44 08/16/2015   LDLCALC 58 08/16/2015   TRIG 87 08/16/2015   CHOLHDL 2.7 08/16/2015   Assessment/Plan HTN B/p stable. Continue Metoprolol and Mavik. Monitor BMP  CHF No  abrupt weight gain. Exam findings  negative for edema or rales.Dimished wheezes noted possible COPD. Continue Metoprolol,Mavik and Furosemide 40 mg Tablet.    COPD Seen by Pulmonary specialist Dr. Flora Lipps for COPD. Prednisone 40 mg Tablet x 7 days and Flutter valve ordered.continue on Spiriva, Advair and Nebs.   Type 2 DM CBG's ranging in the 80's -150's with x 1 episode record as high. Continue on Metaglip 2.5-500 mg Tablet twice daily. On ASA, Statin, ACEI. Refer to podiatrist for annual foot exam. Opthalmology for diabetic eye exam.Will monitor Hgb A1C.    Hyperlipidemia  Continue on Atorvastatin. Will monitor Lipid panel periodically.   Family/ staff Communication: Reviewed plan of care with patient and facility Nurse supervisor.   Labs/tests ordered: None

## 2015-12-15 ENCOUNTER — Ambulatory Visit: Payer: Medicare Other | Attending: Family | Admitting: Family

## 2015-12-15 ENCOUNTER — Encounter: Payer: Self-pay | Admitting: Family

## 2015-12-15 VITALS — BP 138/68 | HR 78 | Resp 18 | Ht 59.0 in | Wt 225.0 lb

## 2015-12-15 DIAGNOSIS — E119 Type 2 diabetes mellitus without complications: Secondary | ICD-10-CM | POA: Diagnosis not present

## 2015-12-15 DIAGNOSIS — I509 Heart failure, unspecified: Secondary | ICD-10-CM | POA: Diagnosis present

## 2015-12-15 DIAGNOSIS — E1100 Type 2 diabetes mellitus with hyperosmolarity without nonketotic hyperglycemic-hyperosmolar coma (NKHHC): Secondary | ICD-10-CM

## 2015-12-15 DIAGNOSIS — Z853 Personal history of malignant neoplasm of breast: Secondary | ICD-10-CM | POA: Diagnosis not present

## 2015-12-15 DIAGNOSIS — Z87891 Personal history of nicotine dependence: Secondary | ICD-10-CM | POA: Diagnosis not present

## 2015-12-15 DIAGNOSIS — R51 Headache: Secondary | ICD-10-CM | POA: Insufficient documentation

## 2015-12-15 DIAGNOSIS — I110 Hypertensive heart disease with heart failure: Secondary | ICD-10-CM | POA: Diagnosis not present

## 2015-12-15 DIAGNOSIS — E785 Hyperlipidemia, unspecified: Secondary | ICD-10-CM | POA: Insufficient documentation

## 2015-12-15 DIAGNOSIS — J449 Chronic obstructive pulmonary disease, unspecified: Secondary | ICD-10-CM | POA: Diagnosis not present

## 2015-12-15 DIAGNOSIS — Z7982 Long term (current) use of aspirin: Secondary | ICD-10-CM | POA: Insufficient documentation

## 2015-12-15 DIAGNOSIS — I5022 Chronic systolic (congestive) heart failure: Secondary | ICD-10-CM

## 2015-12-15 DIAGNOSIS — I10 Essential (primary) hypertension: Secondary | ICD-10-CM

## 2015-12-15 NOTE — Progress Notes (Signed)
Patient ID: Kathryn Hayes, female    DOB: January 17, 1934, 81 y.o.   MRN: [id number]  HPI  Kathryn Hayes is a 81 y/o female with a history of COPD, diabetes, hyperlipidemia, HTN, breast cancer and heart failure with a preserved ejection fraction.   Last echocardiogram was done 08/16/15 which showed an EF of 45-50%. No valvular abnormalities.   Last admission at [facility] was 09/21/15 for a UTI and exacerbation of her heart failure. She was diuresed and treated with antibiotics. Oxygen was continued.   Today she presents for a follow-up visit with fatigue and shortness of breath with little exertion. No swelling iin her legs. Does wear her oxygen at 3L around the clock along with CPAP on a nightly basis. Has finished physical therapy on a daily basis and now says that she really isn't doing much activity because she feels like her legs will give out. Having difficulty in obtaining her nebulizer treatments at the facility.  Past Medical History:  Diagnosis Date  . Breast cancer ([place])   . CHF (congestive heart failure) ([place])   . Chronic headache   . COPD (chronic obstructive pulmonary disease) ([place])   . Diabetes mellitus   . Hyperlipidemia   . Hypertension     Past Surgical History:  Procedure Laterality Date  . BREAST LUMPECTOMY  2007   left  . COLON SURGERY  1993  . TUBAL LIGATION  1971    Family History  Problem Relation Age of Onset  . Factor V Leiden deficiency Sister   . Breast cancer Mother   . Colon cancer Mother     Social History  Substance Use Topics  . Smoking status: Former Smoker    Packs/day: 1.50    Years: 50.00    Types: Cigarettes    Quit date: 10/05/2010  . Smokeless tobacco: Never Used  . Alcohol use No    No Known Allergies  Prior to Admission medications   Medication Sig Start Date End Date Taking? Authorizing Provider  acetaminophen (TYLENOL) 650 MG CR tablet Take 650 mg by mouth. Take one every 4 hours as needed for pain per standing ordered.   Yes Historical  Provider, MD  albuterol (PROVENTIL) (2.5 MG/3ML) 0.083% nebulizer solution Take 2.5 mg by nebulization every 4 (four) hours as needed for wheezing or shortness of breath.    Yes Historical Provider, MD  aspirin 81 MG tablet Take 81 mg by mouth daily.    Yes Historical Provider, MD  atorvastatin (LIPITOR) 10 MG tablet Take 10 mg by mouth at bedtime.   Yes Historical Provider, MD  docusate sodium (COLACE) 100 MG capsule Take 100 mg by mouth daily.    Yes Historical Provider, MD  DULoxetine (CYMBALTA) 60 MG capsule Take 60 mg by mouth daily.    Yes Historical Provider, MD  escitalopram (LEXAPRO) 20 MG tablet Take 20 mg by mouth daily.    Yes Historical Provider, MD  ferrous sulfate 325 (65 FE) MG tablet Take 325 mg by mouth daily with breakfast.    Yes Historical Provider, MD  Fluticasone-Salmeterol (ADVAIR) 250-50 MCG/DOSE AEPB Inhale 1 puff into the lungs every 12 (twelve) hours.    Yes Historical Provider, MD  furosemide (LASIX) 40 MG tablet Take 40 mg by mouth daily.   Yes Historical Provider, MD  gabapentin (NEURONTIN) 400 MG capsule Take 400 mg by mouth 3 (three) times daily.   Yes Historical Provider, MD  glipiZIDE-metformin (METAGLIP) 2.5-500 MG tablet Take 1 tablet by mouth 2 (two)  times daily before a meal.   Yes Historical Provider, MD  Hexylresorcinol (THROAT LOZENGES MT) Use as directed in the mouth or throat. Take every 6 hours as needed for dry mouth.   Yes Historical Provider, MD  LORazepam (ATIVAN) 1 MG tablet Take 1 mg by mouth every 8 (eight) hours as needed. Hold for sedation    Yes Historical Provider, MD  magnesium oxide (MAG-OX) 400 MG tablet Take 400 mg by mouth daily.   Yes Historical Provider, MD  meloxicam (MOBIC) 7.5 MG tablet Take 7.5 mg by mouth daily.   Yes Historical Provider, MD  metoprolol tartrate (LOPRESSOR) 25 MG tablet Take 25 mg by mouth 2 (two) times daily.    Yes Historical Provider, MD  nystatin (MYCOSTATIN/NYSTOP) powder Apply 1 application topically to  abdominal folds daily for redness and irritation.   Yes Historical Provider, MD  OXYGEN Inhale 4 L into the lungs continuous. At all times for COPD    Yes Historical Provider, MD  pantoprazole (PROTONIX) 40 MG tablet Take 40 mg by mouth 2 (two) times daily.    Yes Historical Provider, MD  potassium chloride (K-DUR,KLOR-CON) 10 MEQ tablet Take 10 mEq by mouth 2 (two) times daily.   Yes Historical Provider, MD  predniSONE (DELTASONE) 20 MG tablet Take 20 mg by mouth daily with breakfast.   Yes Historical Provider, MD  Respiratory Therapy Supplies (FLUTTER) DEVI Use as directed 12/10/15  Yes Flora Lipps, MD  sucralfate (CARAFATE) 1 G tablet Take 1 g by mouth 4 (four) times daily -  with meals and at bedtime.   Yes Historical Provider, MD  tiotropium (SPIRIVA) 18 MCG inhalation capsule Place 18 mcg into inhaler and inhale 2 (two) times daily.   Yes Historical Provider, MD  trandolapril (MAVIK) 4 MG tablet Take 2 mg by mouth 2 (two) times daily.    Yes Historical Provider, MD  traZODone (DESYREL) 100 MG tablet Take 1 tablet (100 mg total) by mouth at bedtime. 08/25/15  Yes Srikar Sudini, MD  vitamin B-12 (CYANOCOBALAMIN) 1000 MCG tablet Take 1,000 mcg by mouth daily.   Yes Historical Provider, MD     Review of Systems  Constitutional: Positive for fatigue. Negative for appetite change.  HENT: Positive for sore throat. Negative for congestion and postnasal drip.   Eyes: Negative.   Respiratory: Positive for cough, shortness of breath and wheezing. Negative for chest tightness.   Cardiovascular: Negative for chest pain, palpitations and leg swelling.  Gastrointestinal: Negative for abdominal distention and abdominal pain.  Endocrine: Negative.   Genitourinary: Negative.   Musculoskeletal: Negative for back pain and neck pain.  Skin: Negative.   Allergic/Immunologic: Negative.   Neurological: Positive for dizziness. Negative for light-headedness.  Hematological: Negative for adenopathy. Does not  bruise/bleed easily.  Psychiatric/Behavioral: Negative for dysphoric mood and sleep disturbance (wearing oxygen & CPAP at night). The patient is not nervous/anxious.    Vitals:   12/15/15 1316  BP: 138/68  Pulse: 78  Resp: 18  SpO2: 100%  Weight: 225 lb (102.1 kg)  Height: 4\' 11"  (1.499 m)   Wt Readings from Last 3 Encounters:  12/15/15 225 lb (102.1 kg)  12/10/15 225 lb 3.2 oz (102.2 kg)  12/10/15 225 lb (102.1 kg)   Lab Results  Component Value Date   CREATININE 0.6 09/29/2015   CREATININE 0.76 09/24/2015   CREATININE 0.81 09/23/2015   Physical Exam  Constitutional: She is oriented to person, place, and time. She appears well-developed and well-nourished.  HENT:  Head:  Normocephalic and atraumatic.  Eyes: Conjunctivae are normal. Pupils are equal, round, and reactive to light.  Neck: Normal range of motion. Neck supple. No JVD present.  Cardiovascular: Normal rate and regular rhythm.   Pulmonary/Chest: Effort normal. She has no wheezes. She has rhonchi in the right upper field, the right lower field, the left upper field and the left lower field. She has no rales.  Abdominal: Soft. She exhibits no distension. There is no tenderness.  Musculoskeletal: She exhibits no edema or tenderness.  Neurological: She is alert and oriented to person, place, and time.  Skin: Skin is warm and dry.  Psychiatric: She has a normal mood and affect. Her behavior is normal. Thought content normal.  Nursing note and vitals reviewed.   Assessment & Plan:  1: Chronic heart failure with preserved ejection fraction- - NYHA Class III - Euvolemic today - Resume daily weights at facility. Order re-written for daily weights to be done.  - Continue oxygen at 3L around the clock - Hasn't seen cardiology since 09/26/14. Appointment made with Dr. Ubaldo Glassing for 01/06/16. - needs to increase activity as I feel like she is deconditioned especially without physical therapy.   2: HTN- -  BP looks great today -  will be getting an appointment at [facility] with their facility PCP  3: COPD- - Has rhonchi throughout upper & lower lobes - recently saw her pulmonologist Mortimer Fries)  - Encouraged use of her inhalers 2 times daily to keep the cough loose. Says that she's been asking for her nebulizer but the nurse has told patient that she doesn't do that. Daughter is going to followup at [facility] about this.  - Wearing CPAP nightly  4: Diabetes-  - unsure of what recent glucose was but says that it's being checked daily - Taking glipizide-metformin - finished PT; going to try and work on some exercises.   Return here in 3 months or sooner for any questions/problems before then.

## 2015-12-15 NOTE — Patient Instructions (Addendum)
Follow up with:  Dr. Ubaldo Glassing January 06, 2016 @ 2:45 PM

## 2015-12-16 DIAGNOSIS — I10 Essential (primary) hypertension: Secondary | ICD-10-CM | POA: Insufficient documentation

## 2015-12-17 ENCOUNTER — Telehealth: Payer: Self-pay | Admitting: Internal Medicine

## 2015-12-17 NOTE — Telephone Encounter (Signed)
Pt daughter calling asking if we can send an order to [facility] a new order so patient may receive her albuterol every four hours Fax number is [phone]  Please advise

## 2015-12-17 NOTE — Telephone Encounter (Signed)
Dr. Mortimer Fries  Please advise  Pt. Daughter called stating she is concerned about the instructions for her mother's albuterol for her neb machine. She stated with her mother at a home facility she does not believe that she is get her breathing treatments when her mother is requesting them. She wanted to know if there was any way that the instructions could be changed to something along the lines of her have a breathing treatment maybe twice a day

## 2015-12-18 NOTE — Telephone Encounter (Signed)
Can change to ALbuterol NEBS every 8 hrs scheduled

## 2015-12-19 NOTE — Telephone Encounter (Signed)
Order faxed to the number provided below. At this time nothing further is needed.

## 2015-12-25 ENCOUNTER — Encounter: Payer: Self-pay | Admitting: Family

## 2015-12-25 ENCOUNTER — Non-Acute Institutional Stay (SKILLED_NURSING_FACILITY): Payer: Medicare Other | Admitting: Family

## 2015-12-25 DIAGNOSIS — J449 Chronic obstructive pulmonary disease, unspecified: Secondary | ICD-10-CM

## 2015-12-25 DIAGNOSIS — I5022 Chronic systolic (congestive) heart failure: Secondary | ICD-10-CM

## 2015-12-25 DIAGNOSIS — R05 Cough: Secondary | ICD-10-CM | POA: Diagnosis not present

## 2015-12-25 DIAGNOSIS — R059 Cough, unspecified: Secondary | ICD-10-CM

## 2015-12-25 NOTE — Progress Notes (Signed)
Patient ID: Kathryn Hayes, female   DOB: Aug 20, 1934, 81 y.o.   MRN: [id number]  Location:  [place] Room Number: [place] of Service:  SNF (31) Provider:  Dinah Ngetich FNP-C   Kathryn Lovie Macadamia, MD  Patient Care Team: Kathryn Pitch, MD as PCP - General (Family Medicine) Kathryn Graff, FNP as Nurse Practitioner (Family Medicine) Kathryn Spray, MD as Consulting Physician (Cardiology) Kathryn Lipps, MD as Consulting Physician (Pulmonary Disease)  Extended Emergency Contact Information Primary Emergency Contact: Kathryn Hayes,Kathryn Hayes Address: [address]          [place], [place] [address] [name] of [place] Phone: [phone] Mobile Phone: [phone] Relation: Daughter Secondary Emergency Contact: Kathryn Hayes,Kathryn Hayes Address: [address]          [place], [place] [address] [name] of [place] Phone: [phone] Relation: Son  Code Status:  Full Code  Goals of care: Advanced Directive information Advanced Directives 12/25/2015  Does Patient Have a Medical Advance Directive? No  Type of Advance Directive -  Does patient want to make changes to medical advance directive? -  Copy of [place] in Chart? -  Would patient like information on creating a medical advance directive? No - Patient declined     Chief Complaint  Patient presents with  . Cough    cough and wheezing     HPI:  Pt is a 81 y.o. female seen today at [facility]  for an acute visit for evaluation of cough, wheezing and shortness of breath.She has a significant medical history of COPD, CHF, HTN among other conditions. She is seen in her room today per facility Nurse requested. Nurse reports patient has had worsening cough, shortness of breath and wheezing despite uses of breathing treatments. She has been repositioned in fowlers position with relief. Patient states breathing was bad yesterday but has improved today. She denies any  fever, chills or chest pain.     Past Medical History:  Diagnosis Date  . Breast cancer ([place])   . CHF (congestive heart failure) ([place])   . Chronic headache   . COPD (chronic obstructive pulmonary disease) ([place])   . Diabetes mellitus   . Hyperlipidemia   . Hypertension    Past Surgical History:  Procedure Laterality Date  . BREAST LUMPECTOMY  2007   left  . COLON SURGERY  1993  . TUBAL LIGATION  1971    No Known Allergies  Allergies as of 12/25/2015   No Known Allergies     Medication List       Accurate as of 12/25/15 12:17 PM. Always use your most recent med list.          acetaminophen 650 MG CR tablet Commonly known as:  TYLENOL Take 650 mg by mouth. Take one every 4 hours as needed for pain per standing ordered.   albuterol (2.5 MG/3ML) 0.083% nebulizer solution Commonly known as:  PROVENTIL Take 2.5 mg by nebulization every 4 (four) hours as needed for wheezing or shortness of breath.   aspirin 81 MG tablet Take 81 mg by mouth daily.   atorvastatin 10 MG tablet Commonly known as:  LIPITOR Take 10 mg by mouth at bedtime.   docusate sodium 100 MG capsule Commonly known as:  COLACE Take 100 mg by mouth daily.   DULoxetine 60 MG capsule Commonly known as:  CYMBALTA Take 60 mg by mouth daily.   escitalopram 20 MG tablet Commonly known  asLoma Hayes Take 20 mg by mouth daily.   ferrous sulfate 325 (65 FE) MG tablet Take 325 mg by mouth daily with breakfast.   Fluticasone-Salmeterol 250-50 MCG/DOSE Aepb Commonly known as:  ADVAIR Inhale 1 puff into the lungs every 12 (twelve) hours.   FLUTTER Devi Use as directed   furosemide 40 MG tablet Commonly known as:  LASIX Take 40 mg by mouth daily.   gabapentin 400 MG capsule Commonly known as:  NEURONTIN Take 400 mg by mouth 3 (three) times daily.   glipiZIDE-metformin 2.5-500 MG tablet Commonly known as:  METAGLIP Take 1 tablet by mouth 2 (two) times daily before a meal.     guaiFENesin-dextromethorphan 100-10 MG/5ML syrup Commonly known as:  ROBITUSSIN DM Take 10 mLs by mouth every 6 (six) hours as needed for cough.   LORazepam 1 MG tablet Commonly known as:  ATIVAN Take 1 mg by mouth every 8 (eight) hours as needed. Hold for sedation   magnesium oxide 400 MG tablet Commonly known as:  MAG-OX Take 400 mg by mouth daily.   meloxicam 7.5 MG tablet Commonly known as:  MOBIC Take 7.5 mg by mouth daily.   metoprolol tartrate 25 MG tablet Commonly known as:  LOPRESSOR Take 25 mg by mouth 2 (two) times daily.   nystatin powder Commonly known as:  MYCOSTATIN/NYSTOP Apply 1 application topically to abdominal folds daily for redness and irritation.   OXYGEN Inhale 4 L into the lungs continuous. At all times for COPD   pantoprazole 40 MG tablet Commonly known as:  PROTONIX Take 40 mg by mouth 2 (two) times daily.   potassium chloride 10 MEQ tablet Commonly known as:  K-DUR,KLOR-CON Take 10 mEq by mouth 2 (two) times daily.   sucralfate 1 g tablet Commonly known as:  CARAFATE Take 1 g by mouth 4 (four) times daily -  with meals and at bedtime.   THROAT LOZENGES MT Use as directed in the mouth or throat. Take every 6 hours as needed for dry mouth.   tiotropium 18 MCG inhalation capsule Commonly known as:  SPIRIVA Place 18 mcg into inhaler and inhale 2 (two) times daily.   trandolapril 4 MG tablet Commonly known as:  MAVIK Take 2 mg by mouth 2 (two) times daily.   traZODone 100 MG tablet Commonly known as:  DESYREL Take 1 tablet (100 mg total) by mouth at bedtime.   vitamin B-12 1000 MCG tablet Commonly known as:  CYANOCOBALAMIN Take 1,000 mcg by mouth daily.       Review of Systems  Constitutional: Negative for activity change, appetite change, chills, fatigue and fever.  HENT: Negative for congestion, rhinorrhea, sinus pressure and sneezing.   Eyes: Negative.   Respiratory: Negative for chest tightness and wheezing.         Shortness of breath with exertion.  Cardiovascular: Negative for chest pain, palpitations and leg swelling.  Gastrointestinal: Negative for abdominal distention, abdominal pain, constipation, diarrhea, nausea and vomiting.  Genitourinary: Negative for dysuria, frequency and urgency.  Musculoskeletal: Positive for gait problem.  Skin: Negative for color change, pallor and rash.  Psychiatric/Behavioral: Negative for agitation, confusion, hallucinations and sleep disturbance. The patient is not nervous/anxious.     Immunization History  Administered Date(s) Administered  . Influenza Split 10/15/2013  . Influenza, High Dose Seasonal PF 12/05/2012  . PPD Test 10/09/2015  . Pneumococcal Polysaccharide-23 05/16/2010   Pertinent  Health Maintenance Due  Topic Date Due  . FOOT EXAM  03/23/1946  .  OPHTHALMOLOGY EXAM  03/23/1946  . DEXA SCAN  03/22/2001  . INFLUENZA VACCINE  04/03/2016 (Originally 08/05/2015)  . PNA vac Low Risk Adult (2 of 2 - PCV13) 04/03/2016 (Originally 05/16/2011)  . HEMOGLOBIN A1C  03/28/2016   Fall Risk  12/15/2015 10/07/2015 09/09/2015  Falls in the past year? No Yes Yes  Number falls in past yr: - 2 or more 2 or more  Injury with Fall? - No No  Risk Factor Category  - - High Fall Risk  Risk for fall due to : - - History of fall(s)  Follow up - Education provided;Falls evaluation completed Education provided    Vitals:   12/25/15 1214  BP: 114/67  Pulse: 82  Resp: 20  Temp: 98.2 F (36.8 C)  TempSrc: Oral  SpO2: 93%  Weight: 209 lb (94.8 kg)  Height: 4\' 11"  (1.499 m)   Body mass index is 42.21 kg/m. Physical Exam  Constitutional: She is oriented to person, place, and time. She appears well-developed.  obese  HENT:  Head: Normocephalic.  Mouth/Throat: Oropharynx is clear and moist. No oropharyngeal exudate.  Eyes: Conjunctivae and EOM are normal. Pupils are equal, round, and reactive to light. Right eye exhibits no discharge. Left eye exhibits no  discharge. No scleral icterus.  Neck: Normal range of motion. No JVD present. No thyromegaly present.  Cardiovascular: Normal rate, regular rhythm and intact distal pulses.  Exam reveals no gallop and no friction rub.   No murmur heard. Pulmonary/Chest: Effort normal. No respiratory distress.  Bilateral wheezes and rhonchi noted. Oxygen 2 liters [place] in place.   Abdominal: Soft. Bowel sounds are normal. She exhibits no distension. There is no tenderness. There is no rebound and no guarding.  Genitourinary:  Genitourinary Comments: Continent   Musculoskeletal: Normal range of motion. She exhibits no edema, tenderness or deformity.  Unsteady gait   Lymphadenopathy:    She has no cervical adenopathy.  Neurological: She is oriented to person, place, and time.  Skin: Skin is warm and dry. No rash noted. No erythema. No pallor.  Psychiatric: She has a normal mood and affect.    Labs reviewed:  Recent Labs  09/22/15 0543 09/23/15 0456 09/24/15 0422 09/29/15  NA 138 136 135 129*  K 4.6 4.9 4.6 4.7  CL 95* 92* 91*  --   CO2 37* 37* 38*  --   GLUCOSE 115* 102* 101*  --   BUN 11 13 10 10   CREATININE 0.58 0.81 0.76 0.6  CALCIUM 7.5* 7.8* 7.5*  --     Recent Labs  08/15/15 1303 09/29/15  AST 12* 16  ALT 10* 14  ALKPHOS 108 125  BILITOT 0.7  --   PROT 5.8*  --   ALBUMIN 3.1*  --     Recent Labs  09/21/15 1306 09/22/15 0543 09/23/15 0456 09/29/15  WBC 5.4 4.7 5.1 8.6  NEUTROABS 4.0  --   --   --   HGB 10.8* 10.2* 11.2* 12.1  HCT 33.4* 31.7* 34.7* 41  MCV 87.7 86.9 88.7  --   PLT 156 146* 169 239   Lab Results  Component Value Date   TSH 3.338 08/21/2015   Lab Results  Component Value Date   HGBA1C 6.4 09/29/2015   Lab Results  Component Value Date   CHOL 119 08/16/2015   HDL 44 08/16/2015   LDLCALC 58 08/16/2015   TRIG 87 08/16/2015   CHOLHDL 2.7 08/16/2015   Assessment/Plan Cough  Afebrile. Worsening non-productive cough with shortness  of breath. Will  obtain Portable CXR Pa/Lat rule out PNA .   COPD Recently completed Prednisone 40 mg Tablet x 7 days ordered by Pulmonary specialist.Has had worsening cough, shortness of breath. Rhonchi and wheezes on exam. Continue on Flutter valve .Continue on Spiriva, Advair and Nebs. CXR as above.   CHF No recent weight gain or edema. Suspect shortness of breath from worsening COPD. Continue Metoprolol,Mavik and Furosemide 40 mg Tablet. Continue to monitor weight.    Family/ staff Communication: Reviewed plan of care with patient and facility Nurse supervisor.  Labs/tests ordered:  Portable CXR Pa/Lat rule out PNA .

## 2016-01-06 ENCOUNTER — Encounter: Payer: Self-pay | Admitting: Family

## 2016-01-06 ENCOUNTER — Non-Acute Institutional Stay (SKILLED_NURSING_FACILITY): Payer: Medicare Other | Admitting: Family

## 2016-01-06 DIAGNOSIS — R2681 Unsteadiness on feet: Secondary | ICD-10-CM

## 2016-01-06 DIAGNOSIS — R531 Weakness: Secondary | ICD-10-CM

## 2016-01-06 NOTE — Progress Notes (Signed)
Location:  [place] Room Number: [id number] Place of Service:  SNF (31) Provider:  Artavius Stearns FNP-C   [name], MD  Patient Care Team: Juluis Pitch, MD as PCP - General (Family Medicine) Alisa Graff, FNP as Nurse Practitioner (Family Medicine) Teodoro Spray, MD as Consulting Physician (Cardiology) Flora Lipps, MD as Consulting Physician (Pulmonary Disease)  Extended Emergency Contact Information Primary Emergency Contact: [name],[name] Address: [address]          [place], [place] [address] [name] of [place] Phone: [phone] Mobile Phone: [phone] Relation: Daughter Secondary Emergency Contact: Miano,[name] Address: [address]          [place], [place] [address] [name] of [place] Phone: [phone] Relation: Son  Code Status:  Full code Goals of care: Advanced Directive information Advanced Directives 01/06/2016  Does Patient Have a Medical Advance Directive? No  Type of Advance Directive -  Does patient want to make changes to medical advance directive? -  Copy of [place] in Chart? -  Would patient like information on creating a medical advance directive? -     Chief Complaint  Patient presents with  . Acute Visit    HPI:  Pt is a 82 y.o. female seen today at [facility] for an acute visit for evaluation of generalized weakness. She has a medical history of HTN, CHF, Obesity, Type 2 DM, Depression among other conditions. She is seen in her room today per facility Nurse request. Nurse reports patient's daughter requesting patient to be reevaluated by PT/OT. Patient complains of leg weakness " Every time they transfer me from the bed/chair my legs give out". She denies any fever, chills or urinary tract infections symptoms. She states recent cough and congestion has improved. CBG's stable ranging in the upper 80's -120's.    Past Medical History:  Diagnosis  Date  . Breast cancer ([place])   . CHF (congestive heart failure) ([place])   . Chronic headache   . COPD (chronic obstructive pulmonary disease) ([place])   . Diabetes mellitus   . Hyperlipidemia   . Hypertension    Past Surgical History:  Procedure Laterality Date  . BREAST LUMPECTOMY  2007   left  . COLON SURGERY  1993  . TUBAL LIGATION  1971    No Known Allergies  Allergies as of 01/06/2016   No Known Allergies     Medication List       Accurate as of 01/06/16  2:42 PM. Always use your most recent med list.          acetaminophen 650 MG CR tablet Commonly known as:  TYLENOL Take 650 mg by mouth. Take one every 4 hours as needed for pain per standing ordered.   albuterol (2.5 MG/3ML) 0.083% nebulizer solution Commonly known as:  PROVENTIL Take 2.5 mg by nebulization every 4 (four) hours as needed for wheezing or shortness of breath.   aspirin 81 MG tablet Take 81 mg by mouth daily.   atorvastatin 10 MG tablet Commonly known as:  LIPITOR Take 10 mg by mouth at bedtime.   docusate sodium 100 MG capsule Commonly known as:  COLACE Take 100 mg by mouth daily.   DULoxetine 60 MG capsule Commonly known as:  CYMBALTA Take 60 mg by mouth daily.   escitalopram 20 MG tablet Commonly known as:  LEXAPRO Take 20 mg by mouth daily.   ferrous sulfate 325 (65 FE) MG tablet  Take 325 mg by mouth daily with breakfast.   Fluticasone-Salmeterol 250-50 MCG/DOSE Aepb Commonly known as:  ADVAIR Inhale 1 puff into the lungs every 12 (twelve) hours.   FLUTTER Devi Use as directed   furosemide 40 MG tablet Commonly known as:  LASIX Take 40 mg by mouth daily.   gabapentin 400 MG capsule Commonly known as:  NEURONTIN Take 400 mg by mouth 3 (three) times daily.   glipiZIDE-metformin 2.5-500 MG tablet Commonly known as:  METAGLIP Take 1 tablet by mouth 2 (two) times daily before a meal.   LORazepam 1 MG tablet Commonly known as:  ATIVAN Take 1 mg by mouth every 8 (eight) hours  as needed. Hold for sedation   magnesium oxide 400 MG tablet Commonly known as:  MAG-OX Take 400 mg by mouth daily.   meloxicam 7.5 MG tablet Commonly known as:  MOBIC Take 7.5 mg by mouth daily.   metoprolol tartrate 25 MG tablet Commonly known as:  LOPRESSOR Take 25 mg by mouth 2 (two) times daily.   nystatin powder Commonly known as:  MYCOSTATIN/NYSTOP Apply 1 application topically to abdominal folds daily for redness and irritation.   OXYGEN Inhale 4 L into the lungs continuous. At all times for COPD   pantoprazole 40 MG tablet Commonly known as:  PROTONIX Take 40 mg by mouth 2 (two) times daily.   potassium chloride 10 MEQ tablet Commonly known as:  K-DUR,KLOR-CON Take 10 mEq by mouth 2 (two) times daily.   PROSTATE PO Take by mouth. Take twice daily for 30 days   sucralfate 1 g tablet Commonly known as:  CARAFATE Take 1 g by mouth 4 (four) times daily -  with meals and at bedtime.   THROAT LOZENGES MT Use as directed in the mouth or throat. Take every 6 hours as needed for dry mouth.   tiotropium 18 MCG inhalation capsule Commonly known as:  SPIRIVA Place 18 mcg into inhaler and inhale 2 (two) times daily.   trandolapril 4 MG tablet Commonly known as:  MAVIK Take 2 mg by mouth 2 (two) times daily.   traZODone 100 MG tablet Commonly known as:  DESYREL Take 1 tablet (100 mg total) by mouth at bedtime.   vitamin B-12 1000 MCG tablet Commonly known as:  CYANOCOBALAMIN Take 1,000 mcg by mouth daily.       Review of Systems  Constitutional: Negative for activity change, appetite change, chills, fatigue and fever.  HENT: Negative for congestion, rhinorrhea, sinus pressure and sneezing.   Eyes: Negative.   Respiratory: Negative for chest tightness and wheezing.        Shortness of breath with exertion.  Cardiovascular: Negative for chest pain, palpitations and leg swelling.  Gastrointestinal: Negative for abdominal distention, abdominal pain,  constipation, diarrhea, nausea and vomiting.  Endocrine: Negative for polydipsia, polyphagia and polyuria.  Genitourinary: Negative for dysuria, frequency and urgency.  Musculoskeletal: Positive for gait problem.  Skin: Negative for color change, pallor and rash.  Neurological: Negative for dizziness, seizures, syncope, light-headedness and headaches.       Generalized weakness   Psychiatric/Behavioral: Negative for agitation, confusion, hallucinations and sleep disturbance. The patient is not nervous/anxious.     Immunization History  Administered Date(s) Administered  . Influenza Split 10/15/2013  . Influenza, High Dose Seasonal PF 12/05/2012  . PPD Test 10/09/2015  . Pneumococcal Polysaccharide-23 05/16/2010   Pertinent  Health Maintenance Due  Topic Date Due  . FOOT EXAM  03[birth year]/1946  . OPHTHALMOLOGY EXAM  03/23/1944  .  DEXA SCAN  03/22/2001  . INFLUENZA VACCINE  04/03/2016 (Originally 08/05/2015)  . PNA vac Low Risk Adult (2 of 2 - PCV13) 04/03/2016 (Originally 05/16/2011)  . HEMOGLOBIN A1C  03/28/2016   Fall Risk  12/15/2015 10/07/2015 09/09/2015  Falls in the past year? No Yes Yes  Number falls in past yr: - 2 or more 2 or more  Injury with Fall? - No No  Risk Factor Category  - - High Fall Risk  Risk for fall due to : - - History of fall(s)  Follow up - Education provided;Falls evaluation completed Education provided   Functional Status Survey:    Vitals:   01/06/16 1425  BP: 138/64  Pulse: 71  Resp: 20  Temp: 98.6 F (37 C)  SpO2: 93%  Weight: 216 lb (98 kg)  Height: 4\' 11"  (1.499 m)   Body mass index is 43.63 kg/m. Physical Exam  Constitutional: She is oriented to person, place, and time. She appears well-developed.  obese  HENT:  Head: Normocephalic.  Mouth/Throat: Oropharynx is clear and moist. No oropharyngeal exudate.  Eyes: Conjunctivae and EOM are normal. Pupils are equal, round, and reactive to light. Right eye exhibits no discharge. Left eye exhibits  no discharge. No scleral icterus.  Neck: Normal range of motion. No JVD present. No thyromegaly present.  Cardiovascular: Normal rate, regular rhythm and intact distal pulses.  Exam reveals no gallop and no friction rub.   No murmur heard. Pulmonary/Chest: Effort normal and breath sounds normal. No respiratory distress. She has no wheezes. She has no rales.   Oxygen 2 liters [place] in place.   Abdominal: Soft. Bowel sounds are normal. She exhibits no distension. There is no tenderness. There is no rebound and no guarding.  Genitourinary:  Genitourinary Comments: Continent   Musculoskeletal: Normal range of motion. She exhibits no edema, tenderness or deformity.  Unsteady gait. Moves x 4 extremities. Bilateral lower extremities weakness.   Lymphadenopathy:    She has no cervical adenopathy.  Neurological: She is oriented to person, place, and time.  Skin: Skin is warm and dry. No rash noted. No erythema. No pallor.  Psychiatric: She has a normal mood and affect.    Labs reviewed:  Recent Labs  09/22/15 0543 09/23/15 0456 09/24/15 0422 09/29/15  NA 138 136 135 129*  K 4.6 4.9 4.6 4.7  CL 95* 92* 91*  --   CO2 37* 37* 38*  --   GLUCOSE 115* 102* 101*  --   BUN 11 13 10 10   CREATININE 0.58 0.81 0.76 0.6  CALCIUM 7.5* 7.8* 7.5*  --     Recent Labs  08/15/15 1303 09/29/15  AST 12* 16  ALT 10* 14  ALKPHOS 108 125  BILITOT 0.7  --   PROT 5.8*  --   ALBUMIN 3.1*  --     Recent Labs  09/21/15 1306 09/22/15 0543 09/23/15 0456 09/29/15  WBC 5.4 4.7 5.1 8.6  NEUTROABS 4.0  --   --   --   HGB 10.8* 10.2* 11.2* 12.1  HCT 33.4* 31.7* 34.7* 41  MCV 87.7 86.9 88.7  --   PLT 156 146* 169 239   Lab Results  Component Value Date   TSH 3.338 08/21/2015   Lab Results  Component Value Date   HGBA1C 6.4 09/29/2015   Lab Results  Component Value Date   CHOL 119 08/16/2015   HDL 44 08/16/2015   LDLCALC 58 08/16/2015   TRIG 87 08/16/2015   CHOLHDL 2.7  08/16/2015     Assessment/Plan  Unsteady gait  Tranfers with assistance. PT/OT for ROM, Exercise, gait stability and muscle strengthening.   Generalized Muscle weakness  Worse on the lower extremities. Reports legs giving out with transfer. Will have PT/OT to evaluate for muscle weakness.    Family/ staff Communication: Reviewed plan of care with patient and facility Nurse supervisor.   Labs/tests ordered:  None

## 2016-01-13 ENCOUNTER — Non-Acute Institutional Stay (SKILLED_NURSING_FACILITY): Payer: Medicare Other | Admitting: Family

## 2016-01-13 DIAGNOSIS — E782 Mixed hyperlipidemia: Secondary | ICD-10-CM | POA: Diagnosis not present

## 2016-01-13 DIAGNOSIS — I5022 Chronic systolic (congestive) heart failure: Secondary | ICD-10-CM

## 2016-01-13 DIAGNOSIS — E1121 Type 2 diabetes mellitus with diabetic nephropathy: Secondary | ICD-10-CM | POA: Diagnosis not present

## 2016-01-13 DIAGNOSIS — J449 Chronic obstructive pulmonary disease, unspecified: Secondary | ICD-10-CM | POA: Diagnosis not present

## 2016-01-13 DIAGNOSIS — I110 Hypertensive heart disease with heart failure: Secondary | ICD-10-CM | POA: Diagnosis not present

## 2016-01-13 NOTE — Progress Notes (Signed)
Location:  [facility] Room Number: [place] of Service:  SNF (31) Provider:  Dinah Ngetich FNP-C   [name], MD  Patient Care Team: Juluis Pitch, MD as PCP - General (Family Medicine) Alisa Graff, FNP as Nurse Practitioner (Family Medicine) Teodoro Spray, MD as Consulting Physician (Cardiology) Flora Lipps, MD as Consulting Physician (Pulmonary Disease)  Extended Emergency Contact Information Primary Emergency Contact: [name],[name] Address: [place], [place] [address] [name] of [place] Phone: [phone] Mobile Phone: [phone] Relation: Daughter Secondary Emergency Contact: Labreck,[name] Address: [address]          [place], [place] [address] [name] of [place] Phone: [phone] Relation: Son  Code Status:  Full Code  Goals of care: Advanced Directive information Advanced Directives 01/06/2016  Does Patient Have a Medical Advance Directive? No  Type of Advance Directive -  Does patient want to make changes to medical advance directive? -  Copy of [place] in Chart? -  Would patient like information on creating a medical advance directive? -     Chief Complaint  Patient presents with  . Medical Management of Chronic Issues    HPI:  Pt is a 82 y.o. female seen today at [facility] for medical management of chronic diseases. She has a medical history of HTN, CHF, Type 2 DM, COPD, Hyperlipidemia, Depression among other conditions. She is seen in her room today. She denies any acute issues this visit. She states feeling much from recent URI. Her CBG's ranging in the 90's-120's. She continues with skilled therapy for generalized weakness. She has had no recent weight changes, fall episodes or hospital admission. Facility Nurse reports no new concerns.    Past Medical History:  Diagnosis Date  . Breast cancer ([place])   . CHF (congestive  heart failure) ([place])   . Chronic headache   . COPD (chronic obstructive pulmonary disease) ([place])   . Diabetes mellitus   . Hyperlipidemia   . Hypertension    Past Surgical History:  Procedure Laterality Date  . BREAST LUMPECTOMY  2007   left  . COLON SURGERY  1993  . TUBAL LIGATION  1971    No Known Allergies  Allergies as of 01/13/2016   No Known Allergies     Medication List       Accurate as of 01/13/16  2:29 PM. Always use your most recent med list.          acetaminophen 650 MG CR tablet Commonly known as:  TYLENOL Take 650 mg by mouth. Take one every 4 hours as needed for pain per standing ordered.   albuterol (2.5 MG/3ML) 0.083% nebulizer solution Commonly known as:  PROVENTIL Take 2.5 mg by nebulization every 4 (four) hours as needed for wheezing or shortness of breath.   aspirin 81 MG tablet Take 81 mg by mouth daily.   atorvastatin 10 MG tablet Commonly known as:  LIPITOR Take 10 mg by mouth at bedtime.   docusate sodium 100 MG capsule Commonly known as:  COLACE Take 100 mg by mouth daily.   DULoxetine 60 MG capsule Commonly known as:  CYMBALTA Take 60 mg by mouth daily.   escitalopram 20 MG tablet Commonly known as:  LEXAPRO Take 20 mg by mouth daily.   ferrous sulfate 325 (65 FE) MG tablet Take 325 mg by mouth daily with breakfast.  Fluticasone-Salmeterol 250-50 MCG/DOSE Aepb Commonly known as:  ADVAIR Inhale 1 puff into the lungs every 12 (twelve) hours.   FLUTTER Devi Use as directed   furosemide 40 MG tablet Commonly known as:  LASIX Take 40 mg by mouth daily.   gabapentin 400 MG capsule Commonly known as:  NEURONTIN Take 400 mg by mouth 3 (three) times daily.   glipiZIDE-metformin 2.5-500 MG tablet Commonly known as:  METAGLIP Take 1 tablet by mouth 2 (two) times daily before a meal.   LORazepam 1 MG tablet Commonly known as:  ATIVAN Take 1 mg by mouth every 8 (eight) hours as needed. Hold for sedation   magnesium oxide  400 MG tablet Commonly known as:  MAG-OX Take 400 mg by mouth daily.   meloxicam 7.5 MG tablet Commonly known as:  MOBIC Take 7.5 mg by mouth daily.   metoprolol tartrate 25 MG tablet Commonly known as:  LOPRESSOR Take 25 mg by mouth 2 (two) times daily.   nystatin powder Commonly known as:  MYCOSTATIN/NYSTOP Apply 1 application topically to abdominal folds daily for redness and irritation.   OXYGEN Inhale 4 L into the lungs continuous. At all times for COPD   pantoprazole 40 MG tablet Commonly known as:  PROTONIX Take 40 mg by mouth 2 (two) times daily.   potassium chloride 10 MEQ tablet Commonly known as:  K-DUR,KLOR-CON Take 10 mEq by mouth 2 (two) times daily.   PROSTATE PO Take by mouth. Take twice daily for 30 days   sucralfate 1 g tablet Commonly known as:  CARAFATE Take 1 g by mouth 4 (four) times daily -  with meals and at bedtime.   THROAT LOZENGES MT Use as directed in the mouth or throat. Take every 6 hours as needed for dry mouth.   tiotropium 18 MCG inhalation capsule Commonly known as:  SPIRIVA Place 18 mcg into inhaler and inhale 2 (two) times daily.   trandolapril 4 MG tablet Commonly known as:  MAVIK Take 2 mg by mouth 2 (two) times daily.   traZODone 100 MG tablet Commonly known as:  DESYREL Take 1 tablet (100 mg total) by mouth at bedtime.   vitamin B-12 1000 MCG tablet Commonly known as:  CYANOCOBALAMIN Take 1,000 mcg by mouth daily.       Review of Systems  Constitutional: Negative for activity change, appetite change, chills, fatigue and fever.  HENT: Negative for congestion, rhinorrhea, sinus pressure, sneezing and sore throat.   Eyes: Negative.   Respiratory: Negative for cough, chest tightness, shortness of breath and wheezing.   Cardiovascular: Negative for chest pain, palpitations and leg swelling.  Gastrointestinal: Negative for abdominal distention, abdominal pain, constipation, diarrhea, nausea and vomiting.  Endocrine:  Negative.   Genitourinary: Negative for dysuria, frequency and urgency.  Musculoskeletal: Positive for gait problem.  Skin: Negative for color change, pallor and rash.  Neurological: Negative for dizziness, seizures, syncope, light-headedness and headaches.  Hematological: Does not bruise/bleed easily.  Psychiatric/Behavioral: Negative for agitation, confusion, hallucinations and sleep disturbance. The patient is not nervous/anxious.     Immunization History  Administered Date(s) Administered  . Influenza Split 10/15/2013  . Influenza, High Dose Seasonal PF 12/05/2012  . PPD Test 10/09/2015  . Pneumococcal Polysaccharide-23 05/16/2010   Pertinent  Health Maintenance Due  Topic Date Due  . FOOT EXAM  03/23/1944  . OPHTHALMOLOGY EXAM  03/23/1944  . DEXA SCAN  03/22/2001  . INFLUENZA VACCINE  04/03/2016 (Originally 08/05/2015)  . PNA vac Low Risk Adult (2  of 2 - PCV13) 04/03/2016 (Originally 05/16/2011)  . HEMOGLOBIN A1C  03/28/2016   Fall Risk  12/15/2015 10/07/2015 09/09/2015  Falls in the past year? No Yes Yes  Number falls in past yr: - 2 or more 2 or more  Injury with Fall? - No No  Risk Factor Category  - - High Fall Risk  Risk for fall due to : - - History of fall(s)  Follow up - Education provided;Falls evaluation completed Education provided    Vitals:   01/13/16 1045  BP: 114/83  Pulse: 71  Resp: 20  Temp: 98 F (36.7 C)  SpO2: 95%  Weight: 216 lb (98 kg)  Height: 4\' 11"  (1.499 m)   Body mass index is 43.63 kg/m. Physical Exam  Constitutional: She is oriented to person, place, and time. She appears well-developed.  obese  HENT:  Head: Normocephalic.  Mouth/Throat: Oropharynx is clear and moist. No oropharyngeal exudate.  Eyes: Conjunctivae and EOM are normal. Pupils are equal, round, and reactive to light. Right eye exhibits no discharge. Left eye exhibits no discharge. No scleral icterus.  Neck: Normal range of motion. No JVD present. No thyromegaly present.    Cardiovascular: Normal rate, regular rhythm and intact distal pulses.  Exam reveals no gallop and no friction rub.   No murmur heard. Pulmonary/Chest: Effort normal. No respiratory distress. She has no wheezes. She has no rales.  Oxygen 2 liters [place] in place.   Abdominal: Soft. Bowel sounds are normal. She exhibits no distension. There is no tenderness. There is no rebound and no guarding.  Genitourinary:  Genitourinary Comments: Continent   Musculoskeletal: Normal range of motion. She exhibits no edema, tenderness or deformity.  Unsteady gait   Lymphadenopathy:    She has no cervical adenopathy.  Neurological: She is oriented to person, place, and time.  Skin: Skin is warm and dry. No rash noted. No erythema. No pallor.  Psychiatric: She has a normal mood and affect.    Labs reviewed:  Recent Labs  09/22/15 0543 09/23/15 0456 09/24/15 0422 09/29/15  NA 138 136 135 129*  K 4.6 4.9 4.6 4.7  CL 95* 92* 91*  --   CO2 37* 37* 38*  --   GLUCOSE 115* 102* 101*  --   BUN 11 13 10 10   CREATININE 0.58 0.81 0.76 0.6  CALCIUM 7.5* 7.8* 7.5*  --     Recent Labs  08/15/15 1303 09/29/15  AST 12* 16  ALT 10* 14  ALKPHOS 108 125  BILITOT 0.7  --   PROT 5.8*  --   ALBUMIN 3.1*  --     Recent Labs  09/21/15 1306 09/22/15 0543 09/23/15 0456 09/29/15  WBC 5.4 4.7 5.1 8.6  NEUTROABS 4.0  --   --   --   HGB 10.8* 10.2* 11.2* 12.1  HCT 33.4* 31.7* 34.7* 41  MCV 87.7 86.9 88.7  --   PLT 156 146* 169 239   Lab Results  Component Value Date   TSH 3.338 08/21/2015   Lab Results  Component Value Date   HGBA1C 6.4 09/29/2015   Lab Results  Component Value Date   CHOL 119 08/16/2015   HDL 44 08/16/2015   LDLCALC 58 08/16/2015   TRIG 87 08/16/2015   CHOLHDL 2.7 08/16/2015   Assessment/Plan Type 2 DM CBG's ranging in 90's -120's with x 1 episode. Continue on Metaglip 2.5-500 mg Tablet twice daily, ASA, Statin and ACEI. Awaiting refer appointment for podiatrist for annual  foot  exam and Opthalmology for diabetic eye exam.Check Hgb A1C  01/14/2016.   Hyperlipidemia  Continue on Atorvastatin. Check Lipid panel 01/14/2016.  CHF No weight changes. Exam findings negative for edema or rales. Continue Metoprolol,Mavik and Furosemide 40 mg Tablet.Monitor weight daily.    HTN B/p stable. Continue Metoprolol and Mavik. Monitor BMP  COPD Afebrile. Breathing stable.Continue on Spiriva, Advair, Nebs, Oxygen via [place] and Flutter valve. Continue to follow up with Pulmonary specialist Dr. Flora Lipps   Family/ staff Communication: Reviewed plan of care with patient and facility Nurse supervisor.   Labs/tests ordered: Lipid Panel and Hg b A1C 01/14/2016

## 2016-01-14 LAB — LIPID PANEL
Cholesterol: 103 mg/dL (ref 0–200)
HDL: 38 mg/dL (ref 35–70)
LDL Cholesterol: 49 mg/dL
TRIGLYCERIDES: 81 mg/dL (ref 40–160)

## 2016-01-14 LAB — HEMOGLOBIN A1C: Hemoglobin A1C: 6.2

## 2016-01-15 ENCOUNTER — Other Ambulatory Visit: Payer: Self-pay | Admitting: *Deleted

## 2016-01-15 MED ORDER — LORAZEPAM 1 MG PO TABS: ORAL_TABLET | ORAL | 1 refills | Status: DC

## 2016-01-15 NOTE — Telephone Encounter (Signed)
[facility] [phone] Fax: [phone]  

## 2016-02-10 ENCOUNTER — Non-Acute Institutional Stay (SKILLED_NURSING_FACILITY): Payer: Medicare Other | Admitting: Family

## 2016-02-10 ENCOUNTER — Encounter
Admission: RE | Admit: 2016-02-10 | Discharge: 2016-02-10 | Disposition: A | Discharge status: Home / Self Care | Payer: Medicare Other | Source: Ambulatory Visit | Attending: Internal Medicine | Admitting: Internal Medicine

## 2016-02-10 ENCOUNTER — Encounter: Payer: Self-pay | Admitting: Family

## 2016-02-10 DIAGNOSIS — D649 Anemia, unspecified: Secondary | ICD-10-CM | POA: Diagnosis not present

## 2016-02-10 DIAGNOSIS — I10 Essential (primary) hypertension: Secondary | ICD-10-CM | POA: Insufficient documentation

## 2016-02-10 DIAGNOSIS — I110 Hypertensive heart disease with heart failure: Secondary | ICD-10-CM | POA: Diagnosis not present

## 2016-02-10 DIAGNOSIS — E871 Hypo-osmolality and hyponatremia: Secondary | ICD-10-CM | POA: Insufficient documentation

## 2016-02-10 DIAGNOSIS — I5022 Chronic systolic (congestive) heart failure: Secondary | ICD-10-CM

## 2016-02-10 DIAGNOSIS — F329 Major depressive disorder, single episode, unspecified: Secondary | ICD-10-CM

## 2016-02-10 DIAGNOSIS — E782 Mixed hyperlipidemia: Secondary | ICD-10-CM | POA: Diagnosis not present

## 2016-02-10 DIAGNOSIS — E119 Type 2 diabetes mellitus without complications: Secondary | ICD-10-CM | POA: Diagnosis not present

## 2016-02-10 DIAGNOSIS — E1121 Type 2 diabetes mellitus with diabetic nephropathy: Secondary | ICD-10-CM | POA: Diagnosis not present

## 2016-02-10 DIAGNOSIS — J449 Chronic obstructive pulmonary disease, unspecified: Secondary | ICD-10-CM | POA: Diagnosis not present

## 2016-02-10 DIAGNOSIS — E785 Hyperlipidemia, unspecified: Secondary | ICD-10-CM | POA: Diagnosis not present

## 2016-02-10 DIAGNOSIS — F32A Depression, unspecified: Secondary | ICD-10-CM

## 2016-02-10 NOTE — Progress Notes (Signed)
Location:  [facility] Room Number: 306-B Place of Service:  SNF (31)  Provider: Marlowe Sax FNP-C   PCP: Juluis Pitch, MD Patient Care Team: Juluis Pitch, MD as PCP - General (Family Medicine) Alisa Graff, FNP as Nurse Practitioner (Family Medicine) Teodoro Spray, MD as Consulting Physician (Cardiology) Flora Lipps, MD as Consulting Physician (Pulmonary Disease)  Extended Emergency Contact Information Primary Emergency Contact: [name],[name] Address: [address]          [place], [place] [address] [name] of [place] Phone: [phone] Mobile Phone: [phone] Relation: Daughter Secondary Emergency Contact: Endo,[name] Address: [address]          [place], [place] [address] [name] of [place] Phone: [phone] Relation: Son  Code Status: Full Code  Goals of care:  Advanced Directive information Advanced Directives 01/06/2016  Does Patient Have a Medical Advance Directive? No  Type of Advance Directive -  Does patient want to make changes to medical advance directive? -  Copy of [place] in Chart? -  Would patient like information on creating a medical advance directive? -     No Known Allergies  Chief Complaint  Patient presents with  . Discharge Note    Discharge Visit     HPI:  82 y.o. female seen today at [facility] [place] [facility] closer to family members. She was here for [facility] post hospital admission admission from 09/2015/17-9/21/2017 post fall with UTI and acute CHF exacerbation. She was started on antibiotics and diuresed She was discharged here for rehabilitation.She has a medical history of HTN, Type 2 DM,CHF, COPD,Hyperlipidemia, depression, morbid obesity among other conditions.She is seen in her room today.She is excited that she will [facility] " it will be closer to home and family members".She has  worked with PT/OT for ROM, Exercise, Gait stability and muscle strengthening.She continues to require transfer with two assist or hoyer lift.She will be discharged with medication from the facility. No DME required. Discharged process will be arranged by facility social worker prior to discharge. Facility Nurse reports no new concerns.     Past Medical History:  Diagnosis Date  . Breast cancer ([place])   . CHF (congestive heart failure) ([place])   . Chronic headache   . COPD (chronic obstructive pulmonary disease) ([place])   . Diabetes mellitus   . Hyperlipidemia   . Hypertension     Past Surgical History:  Procedure Laterality Date  . BREAST LUMPECTOMY  2007   left  . COLON SURGERY  1993  . TUBAL LIGATION  1971      reports that she quit smoking about 5 years ago. Her smoking use included Cigarettes. She has a 75.00 pack-year smoking history. She has never used smokeless tobacco. She reports that she does not drink alcohol. Her drug history is not on file. Social History   Social History  . Marital status: Married    Spouse name: N/A  . Number of children: N/A  . Years of education: N/A   Occupational History  . Not on file.   Social History Main Topics  . Smoking status: Former Smoker    Packs/day: 1.50    Years: 50.00    Types: Cigarettes    Quit date: 10/05/2010  . Smokeless tobacco: Never Used  . Alcohol use No  . Drug use: Unknown  . Sexual activity: Not on file  Other Topics Concern  . Not on file   Social History Narrative  . No narrative on file   No Known Allergies  Pertinent  Health Maintenance Due  Topic Date Due  . FOOT EXAM  03/23/1944  . OPHTHALMOLOGY EXAM  03/23/1944  . DEXA SCAN  03/22/2001  . INFLUENZA VACCINE  04/03/2016 (Originally 08/05/2015)  . PNA vac Low Risk Adult (2 of 2 - PCV13) 04/03/2016 (Originally 05/16/2011)  . HEMOGLOBIN A1C  03/28/2016    Medications: Allergies as of 02/10/2016   No Known Allergies     Medication List         Accurate as of 02/10/16  1:56 PM. Always use your most recent med list.          acetaminophen 650 MG CR tablet Commonly known as:  TYLENOL Take 650 mg by mouth. Take one every 4 hours as needed for pain per standing ordered.   albuterol (2.5 MG/3ML) 0.083% nebulizer solution Commonly known as:  PROVENTIL Take 2.5 mg by nebulization every 4 (four) hours as needed for wheezing or shortness of breath.   aspirin 81 MG tablet Take 81 mg by mouth daily.   atorvastatin 10 MG tablet Commonly known as:  LIPITOR Take 10 mg by mouth at bedtime.   docusate sodium 100 MG capsule Commonly known as:  COLACE Take 100 mg by mouth daily.   DULoxetine 60 MG capsule Commonly known as:  CYMBALTA Take 60 mg by mouth daily.   escitalopram 20 MG tablet Commonly known as:  LEXAPRO Take 20 mg by mouth daily.   ferrous sulfate 325 (65 FE) MG tablet Take 325 mg by mouth daily with breakfast.   Fluticasone-Salmeterol 250-50 MCG/DOSE Aepb Commonly known as:  ADVAIR Inhale 1 puff into the lungs every 12 (twelve) hours.   FLUTTER Devi Use as directed   furosemide 40 MG tablet Commonly known as:  LASIX Take 40 mg by mouth daily.   gabapentin 400 MG capsule Commonly known as:  NEURONTIN Take 400 mg by mouth 3 (three) times daily.   glipiZIDE-metformin 2.5-500 MG tablet Commonly known as:  METAGLIP Take 1 tablet by mouth 2 (two) times daily before a meal.   LORazepam 1 MG tablet Commonly known as:  ATIVAN Take one tablet by mouth every night at bedtime for anxiety   magnesium oxide 400 MG tablet Commonly known as:  MAG-OX Take 400 mg by mouth daily.   meloxicam 7.5 MG tablet Commonly known as:  MOBIC Take 7.5 mg by mouth daily.   metoprolol tartrate 25 MG tablet Commonly known as:  LOPRESSOR Take 25 mg by mouth 2 (two) times daily.   multivitamin tablet Take 1 tablet by mouth daily.   nystatin powder Commonly known as:  MYCOSTATIN/NYSTOP Apply 1 application topically to  abdominal folds daily for redness and irritation.   OXYGEN Inhale 4 L into the lungs continuous. At all times for COPD   pantoprazole 40 MG tablet Commonly known as:  PROTONIX Take 40 mg by mouth 2 (two) times daily.   potassium chloride 10 MEQ tablet Commonly known as:  K-DUR,KLOR-CON Take 10 mEq by mouth 2 (two) times daily.   sucralfate 1 g tablet Commonly known as:  CARAFATE Take 1 g by mouth 4 (four) times daily -  with meals and at bedtime.   THROAT LOZENGES MT Use as directed in the mouth or throat. Take every 6 hours as needed for dry mouth.   tiotropium 18 MCG inhalation capsule Commonly known as:  SPIRIVA Place 18 mcg into inhaler and inhale 2 (two) times daily.   trandolapril 4 MG tablet Commonly known as:  MAVIK Take 2 mg by mouth 2 (two) times daily.   traZODone 100 MG tablet Commonly known as:  DESYREL Take 1 tablet (100 mg total) by mouth at bedtime.   vitamin B-12 1000 MCG tablet Commonly known as:  CYANOCOBALAMIN Take 1,000 mcg by mouth daily.       Review of Systems  Constitutional: Negative for activity change, appetite change, chills, fatigue and fever.  HENT: Negative for congestion, rhinorrhea, sinus pressure, sneezing and sore throat.   Eyes: Negative.   Respiratory: Negative for cough, chest tightness, shortness of breath and wheezing.   Cardiovascular: Negative for chest pain, palpitations and leg swelling.  Gastrointestinal: Negative for abdominal distention, abdominal pain, constipation, diarrhea, nausea and vomiting.  Endocrine: Negative.   Genitourinary: Negative for dysuria, frequency and urgency.  Musculoskeletal: Positive for gait problem.  Skin: Negative for color change, pallor and rash.  Neurological: Negative for dizziness, seizures, syncope, light-headedness and headaches.  Hematological: Does not bruise/bleed easily.  Psychiatric/Behavioral: Negative for agitation, confusion, hallucinations and sleep disturbance. The patient is  not nervous/anxious.     Vitals:   02/10/16 1311  BP: 110/65  Pulse: 85  Resp: 18  Temp: 98.4 F (36.9 C)  TempSrc: Oral  SpO2: 91%  Weight: 202 lb (91.6 kg)  Height: 4\' 11"  (1.499 m)   Body mass index is 40.8 kg/m. Physical Exam  Constitutional: She is oriented to person, place, and time. She appears well-developed.  Morbid obese in no acute distress   HENT:  Head: Normocephalic.  Mouth/Throat: Oropharynx is clear and moist. No oropharyngeal exudate.  Eyes: Conjunctivae and EOM are normal. Pupils are equal, round, and reactive to light. Right eye exhibits no discharge. Left eye exhibits no discharge. No scleral icterus.  Neck: Normal range of motion. No JVD present. No thyromegaly present.  Cardiovascular: Normal rate, regular rhythm and intact distal pulses.  Exam reveals no gallop and no friction rub.   No murmur heard. Pulmonary/Chest: Effort normal and breath sounds normal. No respiratory distress. She has no wheezes. She has no rales.  Oxygen 2 liters [place] in place.   Abdominal: Soft. Bowel sounds are normal. She exhibits no distension. There is no tenderness. There is no rebound and no guarding.  Musculoskeletal: Normal range of motion. She exhibits no edema, tenderness or deformity.  Moves x 4 extremities.Unsteady gait   Lymphadenopathy:    She has no cervical adenopathy.  Neurological: She is oriented to person, place, and time.  Skin: Skin is warm and dry. No rash noted. No erythema. No pallor.  Psychiatric: She has a normal mood and affect.    Labs reviewed: Basic Metabolic Panel:  Recent Labs  09/22/15 0543 09/23/15 0456 09/24/15 0422 09/29/15  NA 138 136 135 129*  K 4.6 4.9 4.6 4.7  CL 95* 92* 91*  --   CO2 37* 37* 38*  --   GLUCOSE 115* 102* 101*  --   BUN 11 13 10 10   CREATININE 0.58 0.81 0.76 0.6  CALCIUM 7.5* 7.8* 7.5*  --    Liver Function Tests:  Recent Labs  08/15/15 1303 09/29/15  AST 12* 16  ALT 10* 14  ALKPHOS 108 125  BILITOT 0.7   --   PROT 5.8*  --   ALBUMIN 3.1*  --    CBC:  Recent Labs  09/21/15 1306 09/22/15 0543 09/23/15 0456 09/29/15  WBC 5.4 4.7 5.1 8.6  NEUTROABS 4.0  --   --   --   HGB 10.8* 10.2* 11.2* 12.1  HCT 33.4* 31.7* 34.7* 41  MCV 87.7 86.9 88.7  --   PLT 156 146* 169 239   Cardiac Enzymes:  Recent Labs  08/15/15 1607 08/15/15 2239 08/16/15 0426  TROPONINI 0.03* 0.03* 0.04*     Recent Labs  09/24/15 2111 09/25/15 0748 09/25/15 1123  GLUCAP 116* 106* 153*   Assessment/Plan:   1. Hypertensive heart disease with CHF (congestive heart failure)  B/p stable. Continue on Metoprolol and Mavik. BMP in 1-2 weeks with PCP  2. Chronic systolic congestive heart failure  Stable.Has had no weight changes. Exam findings negative for edema, shortness of breath, wheezes or rales.Continue Metoprolol,Mavik and Furosemide 40 mg Tablet. Monitor weight daily.continue to follow up with [facility] Failure last seen 12/15/2015 next appointment 03/17/2016 at 1:30 Pm.   3. Chronic obstructive pulmonary disease Breathing stable.Continue on Spiriva, Advair, Nebs, Oxygen via [place] and Flutter valve. Continue to follow up with [facility] Pulmonary specialist Dr. Flora Lipps. Continue C-Pap at bedtime.   4. Controlled type 2 diabetes mellitus with diabetic nephropathy, without [place]-term current use of insulin  CBG's ranging in 100's -150's.Continue on Metaglip 2.5-500 mg Tablet twice daily, ASA, Statin and ACEI.Last foot exam 01/14/2016. diabetic annual eye exam with PCP.Monitor Hgb A1C.     5. Mixed hyperlipidemia Continue on Atorvastatin. Monitor Lipid panel periodically.   6. Depression Stable. Continue Lexapro and Trazodone. Monitor for mood changes.    Patient is being discharged with the following home health services:  None    Patient is being discharged with the following durable medical equipment:   None   Patient has been advised to f/u with their PCP in 1-2 weeks to for a transitions of care  visit. Social services at their facility was responsible for arranging this appointment. Pt was provided with adequate prescriptions of noncontrolled medications to reach the scheduled appointment .For controlled substances, a limited supply was provided as appropriate for the individual patient.  If the pt normally receives these medications from a pain clinic or has a contract with another physician, these medications should be received from that clinic or physician only).    Future labs/tests needed: CBC, BMP in 1-2 weeks with PCP

## 2016-02-11 LAB — GLUCOSE, CAPILLARY
GLUCOSE-CAPILLARY: 100 mg/dL — AB (ref 65–99)
Glucose-Capillary: 80 mg/dL (ref 65–99)
Glucose-Capillary: 92 mg/dL (ref 65–99)

## 2016-02-12 DIAGNOSIS — E119 Type 2 diabetes mellitus without complications: Secondary | ICD-10-CM | POA: Diagnosis not present

## 2016-02-12 LAB — CBC WITH DIFFERENTIAL/PLATELET
BASOS ABS: 0 10*3/uL (ref 0–0.1)
BASOS PCT: 0 %
Eosinophils Absolute: 0.3 10*3/uL (ref 0–0.7)
Eosinophils Relative: 3 %
HEMATOCRIT: 31 % — AB (ref 35.0–47.0)
HEMOGLOBIN: 10.7 g/dL — AB (ref 12.0–16.0)
Lymphocytes Relative: 10 %
Lymphs Abs: 1 10*3/uL (ref 1.0–3.6)
MCH: 28.6 pg (ref 26.0–34.0)
MCHC: 34.4 g/dL (ref 32.0–36.0)
MCV: 83.2 fL (ref 80.0–100.0)
Monocytes Absolute: 0.6 10*3/uL (ref 0.2–0.9)
Monocytes Relative: 6 %
NEUTROS ABS: 8 10*3/uL — AB (ref 1.4–6.5)
NEUTROS PCT: 81 %
Platelets: 294 10*3/uL (ref 150–440)
RBC: 3.73 MIL/uL — ABNORMAL LOW (ref 3.80–5.20)
RDW: 16.8 % — ABNORMAL HIGH (ref 11.5–14.5)
WBC: 10.1 10*3/uL (ref 3.6–11.0)

## 2016-02-12 LAB — COMPREHENSIVE METABOLIC PANEL
ALT: 14 U/L (ref 14–54)
AST: 22 U/L (ref 15–41)
Albumin: 2.4 g/dL — ABNORMAL LOW (ref 3.5–5.0)
Alkaline Phosphatase: 85 U/L (ref 38–126)
Anion gap: 8 (ref 5–15)
BILIRUBIN TOTAL: 0.5 mg/dL (ref 0.3–1.2)
BUN: 15 mg/dL (ref 6–20)
CALCIUM: 8.3 mg/dL — AB (ref 8.9–10.3)
CO2: 30 mmol/L (ref 22–32)
Chloride: 81 mmol/L — ABNORMAL LOW (ref 101–111)
Creatinine, Ser: 0.78 mg/dL (ref 0.44–1.00)
GFR calc Af Amer: >60 mL/min (ref >60)
Glucose, Bld: 123 mg/dL — ABNORMAL HIGH (ref 65–99)
POTASSIUM: 5.2 mmol/L — AB (ref 3.5–5.1)
Sodium: 119 mmol/L — CL (ref 135–145)
TOTAL PROTEIN: 5.4 g/dL — AB (ref 6.5–8.1)

## 2016-02-12 LAB — LIPID PANEL
Cholesterol: 106 mg/dL (ref 0–200)
HDL: 42 mg/dL (ref >40)
LDL CALC: 49 mg/dL (ref 0–99)
TRIGLYCERIDES: 74 mg/dL (ref <150)
Total CHOL/HDL Ratio: 2.5 RATIO
VLDL: 15 mg/dL (ref 0–40)

## 2016-02-12 LAB — MAGNESIUM: Magnesium: 1.5 mg/dL — ABNORMAL LOW (ref 1.7–2.4)

## 2016-02-12 LAB — GLUCOSE, CAPILLARY
GLUCOSE-CAPILLARY: 85 mg/dL (ref 65–99)
Glucose-Capillary: 103 mg/dL — ABNORMAL HIGH (ref 65–99)

## 2016-02-12 LAB — TSH: TSH: 2.998 u[IU]/mL (ref 0.350–4.500)

## 2016-02-12 LAB — VITAMIN B12: Vitamin B-12: 4444 pg/mL — ABNORMAL HIGH (ref 180–914)

## 2016-02-13 DIAGNOSIS — E119 Type 2 diabetes mellitus without complications: Secondary | ICD-10-CM | POA: Diagnosis not present

## 2016-02-13 LAB — HEMOGLOBIN A1C
HEMOGLOBIN A1C: 5.6 % (ref 4.8–5.6)
Mean Plasma Glucose: 114 mg/dL

## 2016-02-13 LAB — GLUCOSE, CAPILLARY
GLUCOSE-CAPILLARY: 101 mg/dL — AB (ref 65–99)
Glucose-Capillary: 85 mg/dL (ref 65–99)

## 2016-02-13 LAB — VITAMIN D 25 HYDROXY (VIT D DEFICIENCY, FRACTURES): Vit D, 25-Hydroxy: 11.7 ng/mL — ABNORMAL LOW (ref 30.0–100.0)

## 2016-02-14 DIAGNOSIS — E119 Type 2 diabetes mellitus without complications: Secondary | ICD-10-CM | POA: Diagnosis not present

## 2016-02-14 LAB — GLUCOSE, CAPILLARY
GLUCOSE-CAPILLARY: 104 mg/dL — AB (ref 65–99)
GLUCOSE-CAPILLARY: 112 mg/dL — AB (ref 65–99)

## 2016-02-15 DIAGNOSIS — E119 Type 2 diabetes mellitus without complications: Secondary | ICD-10-CM | POA: Diagnosis not present

## 2016-02-15 LAB — GLUCOSE, CAPILLARY
GLUCOSE-CAPILLARY: 133 mg/dL — AB (ref 65–99)
Glucose-Capillary: 95 mg/dL (ref 65–99)

## 2016-02-16 DIAGNOSIS — E119 Type 2 diabetes mellitus without complications: Secondary | ICD-10-CM | POA: Diagnosis not present

## 2016-02-16 LAB — BASIC METABOLIC PANEL
ANION GAP: 7 (ref 5–15)
BUN: 9 mg/dL (ref 6–20)
CO2: 30 mmol/L (ref 22–32)
Calcium: 8.2 mg/dL — ABNORMAL LOW (ref 8.9–10.3)
Chloride: 91 mmol/L — ABNORMAL LOW (ref 101–111)
Creatinine, Ser: 0.51 mg/dL (ref 0.44–1.00)
Glucose, Bld: 102 mg/dL — ABNORMAL HIGH (ref 65–99)
POTASSIUM: 4.5 mmol/L (ref 3.5–5.1)
Sodium: 128 mmol/L — ABNORMAL LOW (ref 135–145)

## 2016-02-16 LAB — CBC WITH DIFFERENTIAL/PLATELET
BASOS ABS: 0 10*3/uL (ref 0–0.1)
BASOS PCT: 1 %
EOS PCT: 3 %
Eosinophils Absolute: 0.2 10*3/uL (ref 0–0.7)
HCT: 28.3 % — ABNORMAL LOW (ref 35.0–47.0)
Hemoglobin: 9.7 g/dL — ABNORMAL LOW (ref 12.0–16.0)
Lymphocytes Relative: 17 %
Lymphs Abs: 1.3 10*3/uL (ref 1.0–3.6)
MCH: 28.4 pg (ref 26.0–34.0)
MCHC: 34.3 g/dL (ref 32.0–36.0)
MCV: 82.7 fL (ref 80.0–100.0)
MONO ABS: 0.7 10*3/uL (ref 0.2–0.9)
Monocytes Relative: 9 %
Neutro Abs: 5.5 10*3/uL (ref 1.4–6.5)
Neutrophils Relative %: 70 %
PLATELETS: 279 10*3/uL (ref 150–440)
RBC: 3.42 MIL/uL — AB (ref 3.80–5.20)
RDW: 17.1 % — AB (ref 11.5–14.5)
WBC: 7.7 10*3/uL (ref 3.6–11.0)

## 2016-02-16 LAB — GLUCOSE, CAPILLARY
GLUCOSE-CAPILLARY: 111 mg/dL — AB (ref 65–99)
Glucose-Capillary: 117 mg/dL — ABNORMAL HIGH (ref 65–99)

## 2016-02-17 DIAGNOSIS — E119 Type 2 diabetes mellitus without complications: Secondary | ICD-10-CM | POA: Diagnosis not present

## 2016-02-17 LAB — BASIC METABOLIC PANEL
Anion gap: 11 (ref 5–15)
BUN: 8 mg/dL (ref 6–20)
CHLORIDE: 93 mmol/L — AB (ref 101–111)
CO2: 27 mmol/L (ref 22–32)
Calcium: 8.6 mg/dL — ABNORMAL LOW (ref 8.9–10.3)
Creatinine, Ser: 0.56 mg/dL (ref 0.44–1.00)
GFR calc Af Amer: >60 mL/min (ref >60)
GFR calc non Af Amer: >60 mL/min (ref >60)
GLUCOSE: 97 mg/dL (ref 65–99)
POTASSIUM: 4.7 mmol/L (ref 3.5–5.1)
Sodium: 131 mmol/L — ABNORMAL LOW (ref 135–145)

## 2016-02-17 LAB — BRAIN NATRIURETIC PEPTIDE: B NATRIURETIC PEPTIDE 5: 187 pg/mL — AB (ref 0.0–100.0)

## 2016-02-17 LAB — GLUCOSE, CAPILLARY
Glucose-Capillary: 97 mg/dL (ref 65–99)
Glucose-Capillary: 128 mg/dL — ABNORMAL HIGH (ref 65–99)

## 2016-02-18 ENCOUNTER — Non-Acute Institutional Stay (SKILLED_NURSING_FACILITY): Payer: Medicare Other | Admitting: Gerontology

## 2016-02-18 DIAGNOSIS — I5022 Chronic systolic (congestive) heart failure: Secondary | ICD-10-CM

## 2016-02-18 DIAGNOSIS — E119 Type 2 diabetes mellitus without complications: Secondary | ICD-10-CM | POA: Diagnosis not present

## 2016-02-18 LAB — GLUCOSE, CAPILLARY
GLUCOSE-CAPILLARY: 108 mg/dL — AB (ref 65–99)
Glucose-Capillary: 129 mg/dL — ABNORMAL HIGH (ref 65–99)

## 2016-02-18 NOTE — Progress Notes (Signed)
Location:      Place of Service:  SNF (31) Provider:  Toni Arthurs, NP-C  [name], MD  Patient Care Team: Juluis Pitch, MD as PCP - General (Family Medicine) Alisa Graff, FNP as Nurse Practitioner (Family Medicine) Teodoro Spray, MD as Consulting Physician (Cardiology) Flora Lipps, MD as Consulting Physician (Pulmonary Disease)  Extended Emergency Contact Information Primary Emergency Contact: [name],[name] Address: [place], [place] [address] [name] of [place] Phone: [phone] Mobile Phone: [phone] Relation: Daughter Secondary Emergency Contact: Vorhees,[name] Address: [address]          [place], [place] [address] [name] of [place] Phone: [phone] Relation: Son  Code Status:  full Goals of care: Advanced Directive information Advanced Directives 01/06/2016  Does Patient Have a Medical Advance Directive? No  Type of Advance Directive -  Does patient want to make changes to medical advance directive? -  Copy of [place] in Chart? -  Would patient like information on creating a medical advance directive? -     Chief Complaint  Patient presents with  . Acute Visit    HPI:  Pt is a 82 y.o. female seen today for an acute visit for chest congestion. Pt had been receiving IVF over the past several days due severe hyponatremia. Nursing has been monitoring lung sounds for pulmonary edema. Nursing noted a gradual increase in pt's weight, but lungs remained clear. Recheck of met B showed improvement in Na+ level, so fluids were dc'ed. Today, mild crackles heard throughout the lung fields. No cough, no shortness of breath, no chest pain. Pt reports she is overall feeling well and without complaints. VSS   Past Medical History:  Diagnosis Date  . Breast cancer ([place])   . CHF (congestive heart failure) ([place])   . Chronic headache   . COPD (chronic obstructive pulmonary disease) ([place])   .  Diabetes mellitus   . Hyperlipidemia   . Hypertension    Past Surgical History:  Procedure Laterality Date  . BREAST LUMPECTOMY  2007   left  . COLON SURGERY  1993  . TUBAL LIGATION  1971    No Known Allergies  Allergies as of 02/18/2016   No Known Allergies     Medication List       Accurate as of 02/18/16  4:07 PM. Always use your most recent med list.          acetaminophen 650 MG CR tablet Commonly known as:  TYLENOL Take 650 mg by mouth. Take one every 4 hours as needed for pain per standing ordered.   albuterol (2.5 MG/3ML) 0.083% nebulizer solution Commonly known as:  PROVENTIL Take 2.5 mg by nebulization every 4 (four) hours as needed for wheezing or shortness of breath.   aspirin 81 MG tablet Take 81 mg by mouth daily.   atorvastatin 10 MG tablet Commonly known as:  LIPITOR Take 10 mg by mouth at bedtime.   docusate sodium 100 MG capsule Commonly known as:  COLACE Take 100 mg by mouth daily.   DULoxetine 60 MG capsule Commonly known as:  CYMBALTA Take 60 mg by mouth daily.   escitalopram 20 MG tablet Commonly known as:  LEXAPRO Take 20 mg by mouth daily.   ferrous sulfate 325 (65 FE) MG tablet Take 325 mg by mouth daily with breakfast.   Fluticasone-Salmeterol 250-50 MCG/DOSE Aepb Commonly known as:  ADVAIR Inhale 1 puff into the  lungs every 12 (twelve) hours.   FLUTTER Devi Use as directed   furosemide 40 MG tablet Commonly known as:  LASIX Take 40 mg by mouth daily.   gabapentin 400 MG capsule Commonly known as:  NEURONTIN Take 400 mg by mouth 3 (three) times daily.   glipiZIDE-metformin 2.5-500 MG tablet Commonly known as:  METAGLIP Take 1 tablet by mouth 2 (two) times daily before a meal.   LORazepam 1 MG tablet Commonly known as:  ATIVAN Take one tablet by mouth every night at bedtime for anxiety   magnesium oxide 400 MG tablet Commonly known as:  MAG-OX Take 400 mg by mouth daily.   meloxicam 7.5 MG tablet Commonly known  as:  MOBIC Take 7.5 mg by mouth daily.   metoprolol tartrate 25 MG tablet Commonly known as:  LOPRESSOR Take 25 mg by mouth 2 (two) times daily.   multivitamin tablet Take 1 tablet by mouth daily.   nystatin powder Commonly known as:  MYCOSTATIN/NYSTOP Apply 1 application topically to abdominal folds daily for redness and irritation.   OXYGEN Inhale 4 L into the lungs continuous. At all times for COPD   pantoprazole 40 MG tablet Commonly known as:  PROTONIX Take 40 mg by mouth 2 (two) times daily.   potassium chloride 10 MEQ tablet Commonly known as:  K-DUR,KLOR-CON Take 10 mEq by mouth 2 (two) times daily.   sucralfate 1 g tablet Commonly known as:  CARAFATE Take 1 g by mouth 4 (four) times daily -  with meals and at bedtime.   THROAT LOZENGES MT Use as directed in the mouth or throat. Take every 6 hours as needed for dry mouth.   tiotropium 18 MCG inhalation capsule Commonly known as:  SPIRIVA Place 18 mcg into inhaler and inhale 2 (two) times daily.   trandolapril 4 MG tablet Commonly known as:  MAVIK Take 2 mg by mouth 2 (two) times daily.   traZODone 100 MG tablet Commonly known as:  DESYREL Take 1 tablet (100 mg total) by mouth at bedtime.   vitamin B-12 1000 MCG tablet Commonly known as:  CYANOCOBALAMIN Take 1,000 mcg by mouth daily.       Review of Systems  Constitutional: Negative for activity change, appetite change, chills, diaphoresis and fever.  HENT: Negative for congestion, sneezing, sore throat, trouble swallowing and voice change.   Eyes: Negative for pain, redness and visual disturbance.  Respiratory: Negative for apnea, cough, choking, chest tightness, shortness of breath and wheezing.   Cardiovascular: Negative for chest pain, palpitations and leg swelling.  Gastrointestinal: Negative for abdominal distention, abdominal pain, constipation, diarrhea and nausea.  Genitourinary: Negative for difficulty urinating, dysuria, frequency and  urgency.  Musculoskeletal: Negative for back pain, gait problem and myalgias. Arthralgias: typical arthritis.  Skin: Negative for color change, pallor, rash and wound.  Neurological: Negative for dizziness, tremors, syncope, speech difficulty, weakness, numbness and headaches.  Psychiatric/Behavioral: Negative for agitation and behavioral problems.  All other systems reviewed and are negative.   Immunization History  Administered Date(s) Administered  . Influenza Split 10/15/2013  . Influenza, High Dose Seasonal PF 12/05/2012  . PPD Test 10/09/2015  . Pneumococcal Polysaccharide-23 05/16/2010   Pertinent  Health Maintenance Due  Topic Date Due  . FOOT EXAM  03/23/1944  . OPHTHALMOLOGY EXAM  03/23/1944  . DEXA SCAN  03/22/2001  . INFLUENZA VACCINE  04/03/2016 (Originally 08/05/2015)  . PNA vac Low Risk Adult (2 of 2 - PCV13) 04/03/2016 (Originally 05/16/2011)  . HEMOGLOBIN A1C  08/11/2016   Fall Risk  12/15/2015 10/07/2015 09/09/2015  Falls in the past year? No Yes Yes  Number falls in past yr: - 2 or more 2 or more  Injury with Fall? - No No  Risk Factor Category  - - High Fall Risk  Risk for fall due to : - - History of fall(s)  Follow up - Education provided;Falls evaluation completed Education provided   Functional Status Survey:    Vitals:   02/18/16 0800  Pulse: 80  SpO2: 97%   There is no height or weight on file to calculate BMI. Physical Exam  Constitutional: She is oriented to person, place, and time. Vital signs are normal. She appears well-developed and well-nourished. She is active and cooperative. Nasal cannula in place.  Morbid obese in no acute distress   HENT:  Head: Normocephalic.  Mouth/Throat: Oropharynx is clear and moist. No oropharyngeal exudate.  Eyes: Conjunctivae and EOM are normal. Pupils are equal, round, and reactive to light. Right eye exhibits no discharge. Left eye exhibits no discharge. No scleral icterus.  Neck: Normal range of motion. No JVD  present. No thyromegaly present.  Cardiovascular: Normal rate, regular rhythm and intact distal pulses.  Exam reveals no gallop, no distant heart sounds and no friction rub.   No murmur heard. Pulses:      Dorsalis pedis pulses are 2+ on the right side, and 2+ on the left side.  Pulmonary/Chest: Effort normal. No respiratory distress. She has no decreased breath sounds. She has no wheezes. She has no rhonchi. She has rales in the right upper field, the right middle field, the right lower field, the left upper field, the left middle field and the left lower field.  Oxygen 2 liters [place] in place.   Abdominal: Soft. Bowel sounds are normal. She exhibits no distension. There is no tenderness. There is no rebound and no guarding.  Musculoskeletal: Normal range of motion. She exhibits no edema, tenderness or deformity.  Moves x 4 extremities.Unsteady gait   Lymphadenopathy:    She has no cervical adenopathy.  Neurological: She is alert and oriented to person, place, and time.  Skin: Skin is warm and dry. No rash noted. No erythema. No pallor.  Psychiatric: She has a normal mood and affect.    Labs reviewed:  Recent Labs  02/12/16 1020 02/16/16 0700 02/17/16 1140  NA 119* 128* 131*  K 5.2* 4.5 4.7  CL 81* 91* 93*  CO2 30 30 27   GLUCOSE 123* 102* 97  BUN 15 9 8   CREATININE 0.78 0.51 0.56  CALCIUM 8.3* 8.2* 8.6*  MG 1.5*  --   --     Recent Labs  08/15/15 1303 09/29/15 02/12/16 1020  AST 12* 16 22  ALT 10* 14 14  ALKPHOS 108 125 85  BILITOT 0.7  --  0.5  PROT 5.8*  --  5.4*  ALBUMIN 3.1*  --  2.4*    Recent Labs  09/21/15 1306  09/23/15 0456 09/29/15 02/12/16 1020 02/16/16 0700  WBC 5.4  < > 5.1 8.6 10.1 7.7  NEUTROABS 4.0  --   --   --  8.0* 5.5  HGB 10.8*  < > 11.2* 12.1 10.7* 9.7*  HCT 33.4*  < > 34.7* 41 31.0* 28.3*  MCV 87.7  < > 88.7  --  83.2 82.7  PLT 156  < > 169 239 294 279  < > = values in this interval not displayed. Lab Results  Component Value Date  TSH 2.998 02/12/2016   Lab Results  Component Value Date   HGBA1C 5.6 02/12/2016   Lab Results  Component Value Date   CHOL 106 02/12/2016   HDL 42 02/12/2016   LDLCALC 49 02/12/2016   TRIG 74 02/12/2016   CHOLHDL 2.5 02/12/2016    Significant Diagnostic Results in last 30 days:  No results found.  Assessment/Plan 1. Chronic systolic congestive heart failure (HCC)  Lasix 40 mg IM injection x 1 now for mild pulmonary edema  Monitor for further complications  DC IVF  DC PIV  Family/ staff Communication:   Total Time:  Documentation:  Face to Face:  Family/Phone:   Labs/tests ordered:    Medication list reviewed and assessed for continued appropriateness.  Vikki Ports, NP-C Geriatrics [facility] [phone] [place], [place] [address] Cell Phone (Mon-Fri 8am-5pm):  [phone] On Call:  [phone] & follow prompts after 5pm & weekends Office Phone:  [phone] Office Fax:  [phone]

## 2016-02-19 DIAGNOSIS — E119 Type 2 diabetes mellitus without complications: Secondary | ICD-10-CM | POA: Diagnosis not present

## 2016-02-19 LAB — CBC WITH DIFFERENTIAL/PLATELET
BASOS PCT: 1 %
Basophils Absolute: 0 10*3/uL (ref 0–0.1)
EOS ABS: 0.4 10*3/uL (ref 0–0.7)
Eosinophils Relative: 5 %
HEMATOCRIT: 29.1 % — AB (ref 35.0–47.0)
Hemoglobin: 9.9 g/dL — ABNORMAL LOW (ref 12.0–16.0)
Lymphocytes Relative: 21 %
Lymphs Abs: 1.6 10*3/uL (ref 1.0–3.6)
MCH: 28.5 pg (ref 26.0–34.0)
MCHC: 34.1 g/dL (ref 32.0–36.0)
MCV: 83.4 fL (ref 80.0–100.0)
MONO ABS: 0.7 10*3/uL (ref 0.2–0.9)
MONOS PCT: 9 %
NEUTROS ABS: 4.8 10*3/uL (ref 1.4–6.5)
Neutrophils Relative %: 64 %
Platelets: 272 10*3/uL (ref 150–440)
RBC: 3.49 MIL/uL — ABNORMAL LOW (ref 3.80–5.20)
RDW: 16.7 % — AB (ref 11.5–14.5)
WBC: 7.5 10*3/uL (ref 3.6–11.0)

## 2016-02-19 LAB — BASIC METABOLIC PANEL
Anion gap: 7 (ref 5–15)
BUN: 8 mg/dL (ref 6–20)
CALCIUM: 8.5 mg/dL — AB (ref 8.9–10.3)
CO2: 33 mmol/L — AB (ref 22–32)
CREATININE: 0.46 mg/dL (ref 0.44–1.00)
Chloride: 88 mmol/L — ABNORMAL LOW (ref 101–111)
GFR calc non Af Amer: >60 mL/min (ref >60)
Glucose, Bld: 96 mg/dL (ref 65–99)
Potassium: 4.4 mmol/L (ref 3.5–5.1)
Sodium: 128 mmol/L — ABNORMAL LOW (ref 135–145)

## 2016-02-19 LAB — GLUCOSE, CAPILLARY
Glucose-Capillary: 103 mg/dL — ABNORMAL HIGH (ref 65–99)
Glucose-Capillary: 148 mg/dL — ABNORMAL HIGH (ref 65–99)

## 2016-02-20 DIAGNOSIS — E119 Type 2 diabetes mellitus without complications: Secondary | ICD-10-CM | POA: Diagnosis not present

## 2016-02-20 LAB — GLUCOSE, CAPILLARY
GLUCOSE-CAPILLARY: 111 mg/dL — AB (ref 65–99)
GLUCOSE-CAPILLARY: 122 mg/dL — AB (ref 65–99)

## 2016-02-21 DIAGNOSIS — E119 Type 2 diabetes mellitus without complications: Secondary | ICD-10-CM | POA: Diagnosis not present

## 2016-02-21 LAB — GLUCOSE, CAPILLARY
Glucose-Capillary: 102 mg/dL — ABNORMAL HIGH (ref 65–99)
Glucose-Capillary: 187 mg/dL — ABNORMAL HIGH (ref 65–99)

## 2016-02-22 DIAGNOSIS — E119 Type 2 diabetes mellitus without complications: Secondary | ICD-10-CM | POA: Diagnosis not present

## 2016-02-22 LAB — GLUCOSE, CAPILLARY
GLUCOSE-CAPILLARY: 157 mg/dL — AB (ref 65–99)
Glucose-Capillary: 114 mg/dL — ABNORMAL HIGH (ref 65–99)

## 2016-02-23 DIAGNOSIS — E119 Type 2 diabetes mellitus without complications: Secondary | ICD-10-CM | POA: Diagnosis not present

## 2016-02-23 LAB — GLUCOSE, CAPILLARY
GLUCOSE-CAPILLARY: 124 mg/dL — AB (ref 65–99)
Glucose-Capillary: 158 mg/dL — ABNORMAL HIGH (ref 65–99)

## 2016-02-24 DIAGNOSIS — E119 Type 2 diabetes mellitus without complications: Secondary | ICD-10-CM | POA: Diagnosis not present

## 2016-02-24 LAB — GLUCOSE, CAPILLARY
GLUCOSE-CAPILLARY: 109 mg/dL — AB (ref 65–99)
GLUCOSE-CAPILLARY: 156 mg/dL — AB (ref 65–99)

## 2016-02-25 DIAGNOSIS — E119 Type 2 diabetes mellitus without complications: Secondary | ICD-10-CM | POA: Diagnosis not present

## 2016-02-25 LAB — GLUCOSE, CAPILLARY
Glucose-Capillary: 95 mg/dL (ref 65–99)
Glucose-Capillary: 169 mg/dL — ABNORMAL HIGH (ref 65–99)

## 2016-02-26 DIAGNOSIS — E119 Type 2 diabetes mellitus without complications: Secondary | ICD-10-CM | POA: Diagnosis not present

## 2016-02-26 LAB — GLUCOSE, CAPILLARY
GLUCOSE-CAPILLARY: 167 mg/dL — AB (ref 65–99)
Glucose-Capillary: 105 mg/dL — ABNORMAL HIGH (ref 65–99)

## 2016-02-27 DIAGNOSIS — E119 Type 2 diabetes mellitus without complications: Secondary | ICD-10-CM | POA: Diagnosis not present

## 2016-02-27 LAB — GLUCOSE, CAPILLARY
Glucose-Capillary: 94 mg/dL (ref 65–99)
Glucose-Capillary: 181 mg/dL — ABNORMAL HIGH (ref 65–99)

## 2016-02-28 DIAGNOSIS — E119 Type 2 diabetes mellitus without complications: Secondary | ICD-10-CM | POA: Diagnosis not present

## 2016-02-28 LAB — GLUCOSE, CAPILLARY
GLUCOSE-CAPILLARY: 111 mg/dL — AB (ref 65–99)
GLUCOSE-CAPILLARY: 177 mg/dL — AB (ref 65–99)

## 2016-02-29 DIAGNOSIS — E119 Type 2 diabetes mellitus without complications: Secondary | ICD-10-CM | POA: Diagnosis not present

## 2016-02-29 LAB — GLUCOSE, CAPILLARY
Glucose-Capillary: 119 mg/dL — ABNORMAL HIGH (ref 65–99)
Glucose-Capillary: 209 mg/dL — ABNORMAL HIGH (ref 65–99)

## 2016-03-01 DIAGNOSIS — E119 Type 2 diabetes mellitus without complications: Secondary | ICD-10-CM | POA: Diagnosis not present

## 2016-03-01 LAB — GLUCOSE, CAPILLARY
GLUCOSE-CAPILLARY: 149 mg/dL — AB (ref 65–99)
Glucose-Capillary: 91 mg/dL (ref 65–99)

## 2016-03-02 DIAGNOSIS — E119 Type 2 diabetes mellitus without complications: Secondary | ICD-10-CM | POA: Diagnosis not present

## 2016-03-02 LAB — GLUCOSE, CAPILLARY
Glucose-Capillary: 111 mg/dL — ABNORMAL HIGH (ref 65–99)
Glucose-Capillary: 152 mg/dL — ABNORMAL HIGH (ref 65–99)

## 2016-03-03 DIAGNOSIS — E119 Type 2 diabetes mellitus without complications: Secondary | ICD-10-CM | POA: Diagnosis not present

## 2016-03-03 LAB — GLUCOSE, CAPILLARY
GLUCOSE-CAPILLARY: 135 mg/dL — AB (ref 65–99)
Glucose-Capillary: 109 mg/dL — ABNORMAL HIGH (ref 65–99)

## 2016-03-04 ENCOUNTER — Encounter
Admission: RE | Admit: 2016-03-04 | Discharge: 2016-03-04 | Disposition: A | Discharge status: Home / Self Care | Payer: Medicare Other | Source: Ambulatory Visit | Attending: Internal Medicine | Admitting: Internal Medicine

## 2016-03-04 ENCOUNTER — Non-Acute Institutional Stay (SKILLED_NURSING_FACILITY): Payer: Medicare Other | Admitting: Gerontology

## 2016-03-04 DIAGNOSIS — F419 Anxiety disorder, unspecified: Secondary | ICD-10-CM | POA: Diagnosis not present

## 2016-03-04 DIAGNOSIS — I441 Atrioventricular block, second degree: Secondary | ICD-10-CM | POA: Diagnosis not present

## 2016-03-04 DIAGNOSIS — E559 Vitamin D deficiency, unspecified: Secondary | ICD-10-CM | POA: Diagnosis not present

## 2016-03-04 DIAGNOSIS — K219 Gastro-esophageal reflux disease without esophagitis: Secondary | ICD-10-CM | POA: Diagnosis not present

## 2016-03-04 DIAGNOSIS — Z9981 Dependence on supplemental oxygen: Secondary | ICD-10-CM | POA: Diagnosis not present

## 2016-03-04 DIAGNOSIS — J449 Chronic obstructive pulmonary disease, unspecified: Secondary | ICD-10-CM

## 2016-03-04 DIAGNOSIS — Z950 Presence of cardiac pacemaker: Secondary | ICD-10-CM

## 2016-03-04 DIAGNOSIS — R001 Bradycardia, unspecified: Secondary | ICD-10-CM

## 2016-03-04 DIAGNOSIS — G4731 Primary central sleep apnea: Secondary | ICD-10-CM | POA: Diagnosis not present

## 2016-03-04 DIAGNOSIS — E639 Nutritional deficiency, unspecified: Secondary | ICD-10-CM

## 2016-03-04 DIAGNOSIS — I5022 Chronic systolic (congestive) heart failure: Secondary | ICD-10-CM | POA: Diagnosis not present

## 2016-03-04 DIAGNOSIS — G4700 Insomnia, unspecified: Secondary | ICD-10-CM | POA: Diagnosis not present

## 2016-03-04 DIAGNOSIS — G43909 Migraine, unspecified, not intractable, without status migrainosus: Secondary | ICD-10-CM

## 2016-03-04 DIAGNOSIS — D649 Anemia, unspecified: Secondary | ICD-10-CM | POA: Insufficient documentation

## 2016-03-04 DIAGNOSIS — E222 Syndrome of inappropriate secretion of antidiuretic hormone: Secondary | ICD-10-CM

## 2016-03-04 DIAGNOSIS — Z9989 Dependence on other enabling machines and devices: Secondary | ICD-10-CM

## 2016-03-04 DIAGNOSIS — E782 Mixed hyperlipidemia: Secondary | ICD-10-CM

## 2016-03-04 DIAGNOSIS — I110 Hypertensive heart disease with heart failure: Secondary | ICD-10-CM

## 2016-03-04 DIAGNOSIS — K5900 Constipation, unspecified: Secondary | ICD-10-CM

## 2016-03-04 DIAGNOSIS — E1140 Type 2 diabetes mellitus with diabetic neuropathy, unspecified: Secondary | ICD-10-CM

## 2016-03-04 DIAGNOSIS — E871 Hypo-osmolality and hyponatremia: Secondary | ICD-10-CM | POA: Diagnosis not present

## 2016-03-04 DIAGNOSIS — F329 Major depressive disorder, single episode, unspecified: Secondary | ICD-10-CM | POA: Diagnosis not present

## 2016-03-04 DIAGNOSIS — Z853 Personal history of malignant neoplasm of breast: Secondary | ICD-10-CM

## 2016-03-04 DIAGNOSIS — Z85038 Personal history of other malignant neoplasm of large intestine: Secondary | ICD-10-CM

## 2016-03-04 LAB — GLUCOSE, CAPILLARY: Glucose-Capillary: 112 mg/dL — ABNORMAL HIGH (ref 65–99)

## 2016-03-05 DIAGNOSIS — D649 Anemia, unspecified: Secondary | ICD-10-CM | POA: Diagnosis not present

## 2016-03-05 LAB — CBC WITH DIFFERENTIAL/PLATELET
BASOS ABS: 0 10*3/uL (ref 0–0.1)
BASOS PCT: 1 %
EOS ABS: 0.4 10*3/uL (ref 0–0.7)
EOS PCT: 6 %
HCT: 26.8 % — ABNORMAL LOW (ref 35.0–47.0)
Hemoglobin: 9 g/dL — ABNORMAL LOW (ref 12.0–16.0)
LYMPHS PCT: 23 %
Lymphs Abs: 1.6 10*3/uL (ref 1.0–3.6)
MCH: 28.9 pg (ref 26.0–34.0)
MCHC: 33.8 g/dL (ref 32.0–36.0)
MCV: 85.5 fL (ref 80.0–100.0)
MONO ABS: 0.5 10*3/uL (ref 0.2–0.9)
Monocytes Relative: 8 %
Neutro Abs: 4.2 10*3/uL (ref 1.4–6.5)
Neutrophils Relative %: 62 %
PLATELETS: 336 10*3/uL (ref 150–440)
RBC: 3.14 MIL/uL — AB (ref 3.80–5.20)
RDW: 17.7 % — AB (ref 11.5–14.5)
WBC: 6.7 10*3/uL (ref 3.6–11.0)

## 2016-03-05 LAB — COMPREHENSIVE METABOLIC PANEL
ALBUMIN: 2.3 g/dL — AB (ref 3.5–5.0)
ALT: 16 U/L (ref 14–54)
AST: 20 U/L (ref 15–41)
Alkaline Phosphatase: 58 U/L (ref 38–126)
Anion gap: 8 (ref 5–15)
BUN: 9 mg/dL (ref 6–20)
CHLORIDE: 94 mmol/L — AB (ref 101–111)
CO2: 32 mmol/L (ref 22–32)
CREATININE: 0.53 mg/dL (ref 0.44–1.00)
Calcium: 8.4 mg/dL — ABNORMAL LOW (ref 8.9–10.3)
GFR calc Af Amer: >60 mL/min (ref >60)
GLUCOSE: 122 mg/dL — AB (ref 65–99)
POTASSIUM: 4.4 mmol/L (ref 3.5–5.1)
SODIUM: 134 mmol/L — AB (ref 135–145)
Total Bilirubin: 0.7 mg/dL (ref 0.3–1.2)
Total Protein: 4.8 g/dL — ABNORMAL LOW (ref 6.5–8.1)

## 2016-03-05 LAB — GLUCOSE, CAPILLARY
GLUCOSE-CAPILLARY: 124 mg/dL — AB (ref 65–99)
GLUCOSE-CAPILLARY: 121 mg/dL — AB (ref 65–99)
GLUCOSE-CAPILLARY: 165 mg/dL — AB (ref 65–99)

## 2016-03-06 DIAGNOSIS — D649 Anemia, unspecified: Secondary | ICD-10-CM | POA: Diagnosis not present

## 2016-03-06 LAB — GLUCOSE, CAPILLARY
GLUCOSE-CAPILLARY: 98 mg/dL (ref 65–99)
GLUCOSE-CAPILLARY: 139 mg/dL — AB (ref 65–99)
Glucose-Capillary: 108 mg/dL — ABNORMAL HIGH (ref 65–99)

## 2016-03-07 DIAGNOSIS — D649 Anemia, unspecified: Secondary | ICD-10-CM | POA: Diagnosis not present

## 2016-03-07 LAB — GLUCOSE, CAPILLARY
GLUCOSE-CAPILLARY: 103 mg/dL — AB (ref 65–99)
GLUCOSE-CAPILLARY: 166 mg/dL — AB (ref 65–99)

## 2016-03-08 DIAGNOSIS — D649 Anemia, unspecified: Secondary | ICD-10-CM | POA: Diagnosis not present

## 2016-03-08 LAB — GLUCOSE, CAPILLARY
Glucose-Capillary: 116 mg/dL — ABNORMAL HIGH (ref 65–99)
Glucose-Capillary: 141 mg/dL — ABNORMAL HIGH (ref 65–99)

## 2016-03-09 DIAGNOSIS — D649 Anemia, unspecified: Secondary | ICD-10-CM | POA: Diagnosis not present

## 2016-03-09 LAB — GLUCOSE, CAPILLARY
Glucose-Capillary: 99 mg/dL (ref 65–99)
Glucose-Capillary: 147 mg/dL — ABNORMAL HIGH (ref 65–99)

## 2016-03-10 DIAGNOSIS — D649 Anemia, unspecified: Secondary | ICD-10-CM | POA: Diagnosis not present

## 2016-03-10 LAB — GLUCOSE, CAPILLARY
Glucose-Capillary: 101 mg/dL — ABNORMAL HIGH (ref 65–99)
Glucose-Capillary: 132 mg/dL — ABNORMAL HIGH (ref 65–99)

## 2016-03-11 DIAGNOSIS — D649 Anemia, unspecified: Secondary | ICD-10-CM | POA: Diagnosis not present

## 2016-03-11 LAB — GLUCOSE, CAPILLARY
GLUCOSE-CAPILLARY: 95 mg/dL (ref 65–99)
Glucose-Capillary: 137 mg/dL — ABNORMAL HIGH (ref 65–99)

## 2016-03-12 DIAGNOSIS — D649 Anemia, unspecified: Secondary | ICD-10-CM | POA: Diagnosis not present

## 2016-03-12 LAB — GLUCOSE, CAPILLARY: Glucose-Capillary: 102 mg/dL — ABNORMAL HIGH (ref 65–99)

## 2016-03-15 DIAGNOSIS — D649 Anemia, unspecified: Secondary | ICD-10-CM | POA: Diagnosis not present

## 2016-03-15 LAB — GLUCOSE, CAPILLARY
GLUCOSE-CAPILLARY: 122 mg/dL — AB (ref 65–99)
Glucose-Capillary: 129 mg/dL — ABNORMAL HIGH (ref 65–99)
Glucose-Capillary: 103 mg/dL — ABNORMAL HIGH (ref 65–99)
Glucose-Capillary: 191 mg/dL — ABNORMAL HIGH (ref 65–99)
Glucose-Capillary: 115 mg/dL — ABNORMAL HIGH (ref 65–99)
Glucose-Capillary: 136 mg/dL — ABNORMAL HIGH (ref 65–99)

## 2016-03-16 DIAGNOSIS — D649 Anemia, unspecified: Secondary | ICD-10-CM | POA: Diagnosis not present

## 2016-03-16 LAB — GLUCOSE, CAPILLARY
GLUCOSE-CAPILLARY: 119 mg/dL — AB (ref 65–99)
Glucose-Capillary: 154 mg/dL — ABNORMAL HIGH (ref 65–99)

## 2016-03-17 ENCOUNTER — Ambulatory Visit: Payer: Medicare Other | Admitting: Family

## 2016-03-17 LAB — GLUCOSE, CAPILLARY
Glucose-Capillary: 145 mg/dL — ABNORMAL HIGH (ref 65–99)
Glucose-Capillary: 113 mg/dL — ABNORMAL HIGH (ref 65–99)

## 2016-03-18 DIAGNOSIS — D649 Anemia, unspecified: Secondary | ICD-10-CM | POA: Diagnosis not present

## 2016-03-18 LAB — GLUCOSE, CAPILLARY
Glucose-Capillary: 147 mg/dL — ABNORMAL HIGH (ref 65–99)
Glucose-Capillary: 117 mg/dL — ABNORMAL HIGH (ref 65–99)

## 2016-03-19 DIAGNOSIS — D649 Anemia, unspecified: Secondary | ICD-10-CM | POA: Diagnosis not present

## 2016-03-19 LAB — GLUCOSE, CAPILLARY
GLUCOSE-CAPILLARY: 162 mg/dL — AB (ref 65–99)
Glucose-Capillary: 118 mg/dL — ABNORMAL HIGH (ref 65–99)

## 2016-03-22 DIAGNOSIS — D649 Anemia, unspecified: Secondary | ICD-10-CM | POA: Diagnosis not present

## 2016-03-22 LAB — GLUCOSE, CAPILLARY
GLUCOSE-CAPILLARY: 101 mg/dL — AB (ref 65–99)
Glucose-Capillary: 131 mg/dL — ABNORMAL HIGH (ref 65–99)
Glucose-Capillary: 99 mg/dL (ref 65–99)
Glucose-Capillary: 136 mg/dL — ABNORMAL HIGH (ref 65–99)
Glucose-Capillary: 141 mg/dL — ABNORMAL HIGH (ref 65–99)
Glucose-Capillary: 97 mg/dL (ref 65–99)

## 2016-03-23 ENCOUNTER — Non-Acute Institutional Stay (SKILLED_NURSING_FACILITY): Payer: Medicare Other | Admitting: Gerontology

## 2016-03-23 DIAGNOSIS — J189 Pneumonia, unspecified organism: Secondary | ICD-10-CM

## 2016-03-23 DIAGNOSIS — D649 Anemia, unspecified: Secondary | ICD-10-CM | POA: Diagnosis not present

## 2016-03-23 LAB — GLUCOSE, CAPILLARY
GLUCOSE-CAPILLARY: 124 mg/dL — AB (ref 65–99)
Glucose-Capillary: 170 mg/dL — ABNORMAL HIGH (ref 65–99)

## 2016-03-24 LAB — GLUCOSE, CAPILLARY
GLUCOSE-CAPILLARY: 134 mg/dL — AB (ref 65–99)
GLUCOSE-CAPILLARY: 93 mg/dL (ref 65–99)

## 2016-03-25 ENCOUNTER — Other Ambulatory Visit
Admission: RE | Admit: 2016-03-25 | Discharge: 2016-03-25 | Disposition: A | Discharge status: Home / Self Care | Payer: Medicare Other | Source: Ambulatory Visit | Attending: Gerontology | Admitting: Gerontology

## 2016-03-25 DIAGNOSIS — E222 Syndrome of inappropriate secretion of antidiuretic hormone: Secondary | ICD-10-CM | POA: Insufficient documentation

## 2016-03-25 DIAGNOSIS — I5022 Chronic systolic (congestive) heart failure: Secondary | ICD-10-CM | POA: Insufficient documentation

## 2016-03-25 DIAGNOSIS — Z9981 Dependence on supplemental oxygen: Secondary | ICD-10-CM | POA: Insufficient documentation

## 2016-03-25 DIAGNOSIS — Z85038 Personal history of other malignant neoplasm of large intestine: Secondary | ICD-10-CM | POA: Insufficient documentation

## 2016-03-25 DIAGNOSIS — Z950 Presence of cardiac pacemaker: Secondary | ICD-10-CM | POA: Insufficient documentation

## 2016-03-25 DIAGNOSIS — G43909 Migraine, unspecified, not intractable, without status migrainosus: Secondary | ICD-10-CM | POA: Insufficient documentation

## 2016-03-25 DIAGNOSIS — G4731 Primary central sleep apnea: Secondary | ICD-10-CM | POA: Insufficient documentation

## 2016-03-25 DIAGNOSIS — D649 Anemia, unspecified: Secondary | ICD-10-CM | POA: Diagnosis not present

## 2016-03-25 DIAGNOSIS — R001 Bradycardia, unspecified: Secondary | ICD-10-CM | POA: Insufficient documentation

## 2016-03-25 DIAGNOSIS — E559 Vitamin D deficiency, unspecified: Secondary | ICD-10-CM | POA: Insufficient documentation

## 2016-03-25 DIAGNOSIS — F419 Anxiety disorder, unspecified: Secondary | ICD-10-CM | POA: Insufficient documentation

## 2016-03-25 DIAGNOSIS — Z853 Personal history of malignant neoplasm of breast: Secondary | ICD-10-CM | POA: Insufficient documentation

## 2016-03-25 DIAGNOSIS — I441 Atrioventricular block, second degree: Secondary | ICD-10-CM | POA: Insufficient documentation

## 2016-03-25 DIAGNOSIS — F329 Major depressive disorder, single episode, unspecified: Secondary | ICD-10-CM | POA: Insufficient documentation

## 2016-03-25 DIAGNOSIS — K219 Gastro-esophageal reflux disease without esophagitis: Secondary | ICD-10-CM | POA: Insufficient documentation

## 2016-03-25 DIAGNOSIS — G4700 Insomnia, unspecified: Secondary | ICD-10-CM | POA: Insufficient documentation

## 2016-03-25 DIAGNOSIS — Z9989 Dependence on other enabling machines and devices: Secondary | ICD-10-CM | POA: Insufficient documentation

## 2016-03-25 LAB — COMPREHENSIVE METABOLIC PANEL
ALBUMIN: 2.8 g/dL — AB (ref 3.5–5.0)
ALK PHOS: 54 U/L (ref 38–126)
ALT: 11 U/L — AB (ref 14–54)
AST: 15 U/L (ref 15–41)
Anion gap: 9 (ref 5–15)
BILIRUBIN TOTAL: 0.6 mg/dL (ref 0.3–1.2)
BUN: 13 mg/dL (ref 6–20)
CO2: 34 mmol/L — ABNORMAL HIGH (ref 22–32)
Calcium: 8.7 mg/dL — ABNORMAL LOW (ref 8.9–10.3)
Chloride: 90 mmol/L — ABNORMAL LOW (ref 101–111)
Creatinine, Ser: 0.44 mg/dL (ref 0.44–1.00)
GFR calc Af Amer: >60 mL/min (ref >60)
GFR calc non Af Amer: >60 mL/min (ref >60)
Glucose, Bld: 89 mg/dL (ref 65–99)
POTASSIUM: 4.6 mmol/L (ref 3.5–5.1)
Sodium: 133 mmol/L — ABNORMAL LOW (ref 135–145)
TOTAL PROTEIN: 5.4 g/dL — AB (ref 6.5–8.1)

## 2016-03-25 LAB — CBC WITH DIFFERENTIAL/PLATELET
BASOS ABS: 0.1 10*3/uL (ref 0–0.1)
BASOS PCT: 1 %
Eosinophils Absolute: 0.3 10*3/uL (ref 0–0.7)
Eosinophils Relative: 4 %
HEMATOCRIT: 28.9 % — AB (ref 35.0–47.0)
HEMOGLOBIN: 9.6 g/dL — AB (ref 12.0–16.0)
Lymphocytes Relative: 25 %
Lymphs Abs: 1.8 10*3/uL (ref 1.0–3.6)
MCH: 29.2 pg (ref 26.0–34.0)
MCHC: 33.3 g/dL (ref 32.0–36.0)
MCV: 87.7 fL (ref 80.0–100.0)
Monocytes Absolute: 0.6 10*3/uL (ref 0.2–0.9)
Monocytes Relative: 8 %
NEUTROS ABS: 4.5 10*3/uL (ref 1.4–6.5)
NEUTROS PCT: 62 %
Platelets: 253 10*3/uL (ref 150–440)
RBC: 3.29 MIL/uL — AB (ref 3.80–5.20)
RDW: 17.1 % — AB (ref 11.5–14.5)
WBC: 7.2 10*3/uL (ref 3.6–11.0)

## 2016-03-25 LAB — GLUCOSE, CAPILLARY
Glucose-Capillary: 143 mg/dL — ABNORMAL HIGH (ref 65–99)
Glucose-Capillary: 104 mg/dL — ABNORMAL HIGH (ref 65–99)

## 2016-03-25 LAB — BRAIN NATRIURETIC PEPTIDE: B NATRIURETIC PEPTIDE 5: 190 pg/mL — AB (ref 0.0–100.0)

## 2016-03-25 NOTE — Progress Notes (Signed)
Location:      Place of Service:  SNF (31) Provider:  Toni Arthurs, NP-C  [name], MD  Patient Care Team: Juluis Pitch, MD as PCP - General (Family Medicine) Alisa Graff, FNP as Nurse Practitioner (Family Medicine) Teodoro Spray, MD as Consulting Physician (Cardiology) Flora Lipps, MD as Consulting Physician (Pulmonary Disease)  Extended Emergency Contact Information Primary Emergency Contact: [name],[name] Address: [place], [place] [address] [name] of [place] Phone: [phone] Mobile Phone: [phone] Relation: Daughter Secondary Emergency Contact: Landin,[name] Address: [address]          [place], [place] [address] [name] of [place] Phone: [phone] Relation: Son  Code Status:  full Goals of care: Advanced Directive information Advanced Directives 01/06/2016  Does Patient Have a Medical Advance Directive? No  Type of Advance Directive -  Does patient want to make changes to medical advance directive? -  Copy of [place] in Chart? -  Would patient like information on creating a medical advance directive? -     Chief Complaint  Patient presents with  . Medicare Wellness    HPI:  Pt is a 82 y.o. female seen today for medical management of chronic diseases. Pt has multiple medical co-morbidities. Conditions have been stable in the past month. Pt is resident in skilled nursing. Pt has adapted well. She does not interact with other residents much, but is pleasant. Pt reports she is feeling well at this time. Pt does c/o intermittent, though frequent migraines. She says the pain is in different locations. Nothing makes them better, but tries tylenol for the pain. Reports she has a headache almost everyday. She typically awakens in the morning with them. VSS. No other complaints.    Past Medical History:  Diagnosis Date  . Breast cancer ([place])   . CHF (congestive heart failure) ([place])   .  Chronic headache   . COPD (chronic obstructive pulmonary disease) ([place])   . Diabetes mellitus   . Hyperlipidemia   . Hypertension    Past Surgical History:  Procedure Laterality Date  . BREAST LUMPECTOMY  2007   left  . COLON SURGERY  1993  . TUBAL LIGATION  1971    No Known Allergies  Allergies as of 03/04/2016   No Known Allergies     Medication List       Accurate as of 03/04/16 11:59 PM. Always use your most recent med list.          acetaminophen 650 MG CR tablet Commonly known as:  TYLENOL Take 650 mg by mouth. Take one every 4 hours as needed for pain per standing ordered.   albuterol (2.5 MG/3ML) 0.083% nebulizer solution Commonly known as:  PROVENTIL Take 2.5 mg by nebulization every 4 (four) hours as needed for wheezing or shortness of breath.   aspirin 81 MG tablet Take 81 mg by mouth daily.   atorvastatin 10 MG tablet Commonly known as:  LIPITOR Take 10 mg by mouth at bedtime.   docusate sodium 100 MG capsule Commonly known as:  COLACE Take 100 mg by mouth daily.   DULoxetine 60 MG capsule Commonly known as:  CYMBALTA Take 60 mg by mouth daily.   escitalopram 20 MG tablet Commonly known as:  LEXAPRO Take 20 mg by mouth daily.   ferrous sulfate 325 (65 FE) MG tablet Take 325 mg by mouth daily with breakfast.   Fluticasone-Salmeterol 250-50 MCG/DOSE  Aepb Commonly known as:  ADVAIR Inhale 1 puff into the lungs every 12 (twelve) hours.   FLUTTER Devi Use as directed   furosemide 40 MG tablet Commonly known as:  LASIX Take 40 mg by mouth daily.   gabapentin 400 MG capsule Commonly known as:  NEURONTIN Take 400 mg by mouth 3 (three) times daily.   glipiZIDE-metformin 2.5-500 MG tablet Commonly known as:  METAGLIP Take 1 tablet by mouth 2 (two) times daily before a meal.   LORazepam 1 MG tablet Commonly known as:  ATIVAN Take one tablet by mouth every night at bedtime for anxiety   magnesium oxide 400 MG tablet Commonly known as:   MAG-OX Take 400 mg by mouth daily.   meloxicam 7.5 MG tablet Commonly known as:  MOBIC Take 7.5 mg by mouth daily.   metoprolol tartrate 25 MG tablet Commonly known as:  LOPRESSOR Take 25 mg by mouth 2 (two) times daily.   multivitamin tablet Take 1 tablet by mouth daily.   nystatin powder Commonly known as:  MYCOSTATIN/NYSTOP Apply 1 application topically to abdominal folds daily for redness and irritation.   OXYGEN Inhale 4 L into the lungs continuous. At all times for COPD   pantoprazole 40 MG tablet Commonly known as:  PROTONIX Take 40 mg by mouth 2 (two) times daily.   potassium chloride 10 MEQ tablet Commonly known as:  K-DUR,KLOR-CON Take 10 mEq by mouth 2 (two) times daily.   sucralfate 1 g tablet Commonly known as:  CARAFATE Take 1 g by mouth 4 (four) times daily -  with meals and at bedtime.   THROAT LOZENGES MT Use as directed in the mouth or throat. Take every 6 hours as needed for dry mouth.   tiotropium 18 MCG inhalation capsule Commonly known as:  SPIRIVA Place 18 mcg into inhaler and inhale 2 (two) times daily.   trandolapril 4 MG tablet Commonly known as:  MAVIK Take 2 mg by mouth 2 (two) times daily.   traZODone 100 MG tablet Commonly known as:  DESYREL Take 1 tablet (100 mg total) by mouth at bedtime.   vitamin B-12 1000 MCG tablet Commonly known as:  CYANOCOBALAMIN Take 1,000 mcg by mouth daily.       Review of Systems  Constitutional: Negative for activity change, appetite change, chills, diaphoresis and fever.  HENT: Negative for congestion, sneezing, sore throat, trouble swallowing and voice change.   Eyes: Negative for pain, redness and visual disturbance.  Respiratory: Negative for apnea, cough, choking, chest tightness, shortness of breath and wheezing.   Cardiovascular: Negative for chest pain, palpitations and leg swelling.  Gastrointestinal: Negative for abdominal distention, abdominal pain, constipation, diarrhea and nausea.    Genitourinary: Negative for difficulty urinating, dysuria, frequency and urgency.  Musculoskeletal: Negative for back pain, gait problem and myalgias. Arthralgias: typical arthritis.  Skin: Negative for color change, pallor, rash and wound.  Neurological: Negative for dizziness, tremors, syncope, speech difficulty, weakness, numbness and headaches.  Psychiatric/Behavioral: Negative for agitation and behavioral problems.  All other systems reviewed and are negative.   Immunization History  Administered Date(s) Administered  . Influenza Split 10/15/2013  . Influenza, High Dose Seasonal PF 12/05/2012  . PPD Test 10/09/2015  . Pneumococcal Polysaccharide-23 05/16/2010   Pertinent  Health Maintenance Due  Topic Date Due  . FOOT EXAM  03/23/1944  . OPHTHALMOLOGY EXAM  03/23/1944  . DEXA SCAN  03/22/2001  . INFLUENZA VACCINE  04/03/2016 (Originally 08/05/2015)  . PNA vac Low Risk Adult (  2 of 2 - PCV13) 04/03/2016 (Originally 05/16/2011)  . HEMOGLOBIN A1C  08/11/2016   Fall Risk  12/15/2015 10/07/2015 09/09/2015  Falls in the past year? No Yes Yes  Number falls in past yr: - 2 or more 2 or more  Injury with Fall? - No No  Risk Factor Category  - - High Fall Risk  Risk for fall due to : - - History of fall(s)  Follow up - Education provided;Falls evaluation completed Education provided   Functional Status Survey:    Vitals:   03/04/16 0700  Pulse: 76  Resp: (!) 94   There is no height or weight on file to calculate BMI. Physical Exam  Constitutional: She is oriented to person, place, and time. Vital signs are normal. She appears well-developed and well-nourished. She is active and cooperative. Nasal cannula in place.  Morbid obese in no acute distress   HENT:  Head: Normocephalic.  Mouth/Throat: Oropharynx is clear and moist. No oropharyngeal exudate.  Eyes: Conjunctivae and EOM are normal. Pupils are equal, round, and reactive to light. Right eye exhibits no discharge. Left eye  exhibits no discharge. No scleral icterus.  Neck: Normal range of motion. No JVD present. No thyromegaly present.  Cardiovascular: Normal rate, regular rhythm and intact distal pulses.  Exam reveals no gallop, no distant heart sounds and no friction rub.   No murmur heard. Pulses:      Dorsalis pedis pulses are 2+ on the right side, and 2+ on the left side.  Pulmonary/Chest: Effort normal. No respiratory distress. She has no decreased breath sounds. She has no wheezes. She has no rhonchi. She has no rales.  Oxygen 2 liters [place] in place.   Abdominal: Soft. Bowel sounds are normal. She exhibits no distension. There is no tenderness. There is no rebound and no guarding.  Musculoskeletal: Normal range of motion. She exhibits no edema, tenderness or deformity.  Moves x 4 extremities.Unsteady gait   Lymphadenopathy:    She has no cervical adenopathy.  Neurological: She is alert and oriented to person, place, and time.  Skin: Skin is warm and dry. No rash noted. No erythema. No pallor.  Psychiatric: She has a normal mood and affect.    Labs reviewed:  Recent Labs  02/12/16 1020  02/19/16 0600 03/05/16 0725 03/25/16 0520  NA 119*  < > 128* 134* 133*  K 5.2*  < > 4.4 4.4 4.6  CL 81*  < > 88* 94* 90*  CO2 30  < > 33* 32 34*  GLUCOSE 123*  < > 96 122* 89  BUN 15  < > _0 CREATININE 0.78  < > 0.46 0.53 0.44  CALCIUM 8.3*  < > 8.5* 8.4* 8.7*  MG 1.5*  --   --   --   --   < > = values in this interval not displayed.  Recent Labs  02/12/16 1020 03/05/16 0725 03/25/16 0520  AST _1 ALT 14 16 11*  ALKPHOS 85 58 54  BILITOT 0.5 0.7 0.6  PROT 5.4* 4.8* 5.4*  ALBUMIN 2.4* 2.3* 2.8*    Recent Labs  02/19/16 0600 03/05/16 0725 03/25/16 0520  WBC 7.5 6.7 7.2  NEUTROABS 4.8 4.2 4.5  HGB 9.9* 9.0* 9.6*  HCT 29.1* 26.8* 28.9*  MCV 83.4 85.5 87.7  PLT 272 336 253   Lab Results  Component Value Date   TSH 2.998 02/12/2016   Lab Results  Component Value Date   HGBA1C  5.6 02/12/2016   Lab Results  Component Value Date   CHOL 106 02/12/2016   HDL 42 02/12/2016   LDLCALC 49 02/12/2016   TRIG 74 02/12/2016   CHOLHDL 2.5 02/12/2016    Significant Diagnostic Results in last 30 days:  No results found.  Assessment/Plan 1. Dependence on supplemental oxygen  Continue O2 2L [place]  2. Primary central sleep apnea  CPAP Q HS  3. Dependence on other enabling machines and devices  Cleanse CPAP mask, tubing, chamber water and small amount of dish soap once a week. Dry completely  4. Presence of cardiac pacemaker  Stable   5. Bradycardia, sinus  Stable   6. Second degree AV block  Stable   7. Single current episode of major depressive disorder, unspecified depression episode severity  Continue Duloxetine 60 mg po Q Day  8. Anxiety disorder, unspecified type  Continue Lorazepam 1 mg po Q HS  9. Vitamin D deficiency  Continue Cholecalciferol 6,000 units po Q Day x 12 weeks, then  Cholecalciferol 2,000 units po Q Day  10. Insomnia, unspecified type  Continue Trazodone 100 mg po Q HS  11. Gastroesophageal reflux disease without esophagitis  Continue Protonix 20 mg po BID  Continue Sucralfate 1 gram po QID  12. Syndrome of inappropriate antidiuretic hormone (HCC)  Continue No sodium restriction diet  Continue Gatorade on lunch and supper trays  Continue 1 liter free water restriction/ day  13. Migraine without status migrainosus, not intractable, unspecified migraine type  Melatonin 10 mg po Q HS migraine prophylaxis  14. Personal history of other malignant neoplasm of large intestine (CODE)  stable  15. Personal history of malignant neoplasm of breast  Stable   16. Mixed hyperlipidemia  Stable  No medications necessary  Lipitor DC'ed  17. Type 2 diabetes, controlled, with neuropathy (HCC)  Stable  Continue glipizide-metformin tablet; 2.5-500 mg; amt: 1 tablet po Q Day  FSBS BID  Continue Gabapentin 400 mg  po TID  Continue Mobix 7.5 mg po Q Day  18. Chronic obstructive pulmonary disease, unspecified COPD type ([place])  Continue Spiriva with HandiHaler (tiotropium bromide) capsule, w/inhalation device; 18 mcg- once daily  Continue albuterol sulfate solution for nebulization; 2.5 mg /3 mL (0.083 %) Q 4 hours prn  Continue Advair Diskus (fluticasone-salmeterol) blister with device; 250-50 mcg/dose 1 puff Q 12 hours  19. Hypertensive heart disease with CHF (congestive heart failure) (HCC)  Stable  Continue Lasix 40 mg po Q Day  Continue Metoprolol 25 mg po BID  Continue trandolapril 2 mg PO Q Day  Continue Aspir-81 (aspirin) [OTC] tablet,delayed release (DR/EC); 81 mg po Q Day  Continue potassium chloride tablet extended release; 10 mEq; amt: 1 tablet po Q Day  20. Nutritional Deficiency  Continue Pro-Stat 30 mL PO Q Day  Continue Multivitamin po Q Day  Continue Magnesium Oxide 400 mg po BID  21. Constipation  Continue Colace 100 mg po Q Day  Continue Senna-S po BID  Milk of Mag. 30 ml po Q day prn  Family/ staff Communication:   Total Time:  Documentation:  Face to Face:  Family/Phone:   Labs/tests ordered:  Cbc, met c  Medication list reviewed and assessed for continued appropriateness. Monthly medication orders reviewed and signed.  Vikki Ports, NP-C Geriatrics [facility] [phone] [address] [place],  [address] Cell Phone (Mon-Fri 8am-5pm):  [phone] On Call:  [phone] & follow prompts after 5pm & weekends Office Phone:  [phone] Office  Fax:  [phone]

## 2016-03-26 DIAGNOSIS — D649 Anemia, unspecified: Secondary | ICD-10-CM | POA: Diagnosis not present

## 2016-03-26 LAB — GLUCOSE, CAPILLARY
GLUCOSE-CAPILLARY: 92 mg/dL (ref 65–99)
Glucose-Capillary: 110 mg/dL — ABNORMAL HIGH (ref 65–99)

## 2016-03-29 LAB — GLUCOSE, CAPILLARY
GLUCOSE-CAPILLARY: 131 mg/dL — AB (ref 65–99)
Glucose-Capillary: 101 mg/dL — ABNORMAL HIGH (ref 65–99)
Glucose-Capillary: 160 mg/dL — ABNORMAL HIGH (ref 65–99)
Glucose-Capillary: 96 mg/dL (ref 65–99)
Glucose-Capillary: 164 mg/dL — ABNORMAL HIGH (ref 65–99)
Glucose-Capillary: 104 mg/dL — ABNORMAL HIGH (ref 65–99)

## 2016-03-30 DIAGNOSIS — D649 Anemia, unspecified: Secondary | ICD-10-CM | POA: Diagnosis not present

## 2016-03-31 LAB — GLUCOSE, CAPILLARY
GLUCOSE-CAPILLARY: 107 mg/dL — AB (ref 65–99)
Glucose-Capillary: 122 mg/dL — ABNORMAL HIGH (ref 65–99)

## 2016-04-01 ENCOUNTER — Non-Acute Institutional Stay (SKILLED_NURSING_FACILITY): Payer: Medicare Other | Admitting: Gerontology

## 2016-04-01 DIAGNOSIS — J449 Chronic obstructive pulmonary disease, unspecified: Secondary | ICD-10-CM

## 2016-04-01 DIAGNOSIS — D649 Anemia, unspecified: Secondary | ICD-10-CM | POA: Diagnosis not present

## 2016-04-01 LAB — GLUCOSE, CAPILLARY
Glucose-Capillary: 185 mg/dL — ABNORMAL HIGH (ref 65–99)
Glucose-Capillary: 94 mg/dL (ref 65–99)

## 2016-04-02 DIAGNOSIS — D649 Anemia, unspecified: Secondary | ICD-10-CM | POA: Diagnosis not present

## 2016-04-02 LAB — GLUCOSE, CAPILLARY
GLUCOSE-CAPILLARY: 116 mg/dL — AB (ref 65–99)
Glucose-Capillary: 156 mg/dL — ABNORMAL HIGH (ref 65–99)

## 2016-04-04 ENCOUNTER — Encounter
Admission: RE | Admit: 2016-04-04 | Discharge: 2016-04-04 | Disposition: A | Discharge status: Home / Self Care | Payer: Medicare Other | Source: Ambulatory Visit | Attending: Internal Medicine | Admitting: Internal Medicine

## 2016-04-04 DIAGNOSIS — E119 Type 2 diabetes mellitus without complications: Secondary | ICD-10-CM | POA: Diagnosis present

## 2016-04-04 LAB — GLUCOSE, CAPILLARY: GLUCOSE-CAPILLARY: 148 mg/dL — AB (ref 65–99)

## 2016-04-05 ENCOUNTER — Ambulatory Visit: Payer: Medicare Other | Attending: Family | Admitting: Family

## 2016-04-05 ENCOUNTER — Encounter: Payer: Self-pay | Admitting: Family

## 2016-04-05 VITALS — BP 118/60 | HR 79 | Resp 20 | Ht 59.0 in

## 2016-04-05 DIAGNOSIS — I5022 Chronic systolic (congestive) heart failure: Secondary | ICD-10-CM | POA: Insufficient documentation

## 2016-04-05 DIAGNOSIS — J449 Chronic obstructive pulmonary disease, unspecified: Secondary | ICD-10-CM

## 2016-04-05 DIAGNOSIS — Z87891 Personal history of nicotine dependence: Secondary | ICD-10-CM | POA: Diagnosis not present

## 2016-04-05 DIAGNOSIS — I110 Hypertensive heart disease with heart failure: Secondary | ICD-10-CM | POA: Insufficient documentation

## 2016-04-05 DIAGNOSIS — I10 Essential (primary) hypertension: Secondary | ICD-10-CM

## 2016-04-05 DIAGNOSIS — I5032 Chronic diastolic (congestive) heart failure: Secondary | ICD-10-CM

## 2016-04-05 DIAGNOSIS — E785 Hyperlipidemia, unspecified: Secondary | ICD-10-CM | POA: Diagnosis not present

## 2016-04-05 DIAGNOSIS — E119 Type 2 diabetes mellitus without complications: Secondary | ICD-10-CM | POA: Diagnosis not present

## 2016-04-05 DIAGNOSIS — Z853 Personal history of malignant neoplasm of breast: Secondary | ICD-10-CM | POA: Diagnosis not present

## 2016-04-05 DIAGNOSIS — E1140 Type 2 diabetes mellitus with diabetic neuropathy, unspecified: Secondary | ICD-10-CM

## 2016-04-05 LAB — GLUCOSE, CAPILLARY
Glucose-Capillary: 101 mg/dL — ABNORMAL HIGH (ref 65–99)
Glucose-Capillary: 115 mg/dL — ABNORMAL HIGH (ref 65–99)

## 2016-04-05 NOTE — Progress Notes (Signed)
Location:      Place of Service:  SNF (31) Provider:  Toni Arthurs, NP-C  [name], MD  Patient Care Team: Juluis Pitch, MD as PCP - General (Family Medicine) Alisa Graff, FNP as Nurse Practitioner (Family Medicine) Teodoro Spray, MD as Consulting Physician (Cardiology) Flora Lipps, MD as Consulting Physician (Pulmonary Disease)  Extended Emergency Contact Information Primary Emergency Contact: [name],[name] Address: [place], [place] [address] [name] of [place] Phone: [phone] Mobile Phone: [phone] Relation: Daughter Secondary Emergency Contact: Bonine,[name] Address: [address]          [place], [place] [address] [name] of [place] Phone: [phone] Relation: Son  Code Status:  full Goals of care: Advanced Directive information Advanced Directives 04/05/2016  Does Patient Have a Medical Advance Directive? Yes  Type of Paramedic of [place];Living will  Does patient want to make changes to medical advance directive? -  Copy of [place] in Chart? -  Would patient like information on creating a medical advance directive? -     Chief Complaint  Patient presents with  . Acute Visit    HPI:  Pt is a 82 y.o. female seen today for an acute visit for chest congestion. Pt had had a non-productive cough over the past several days. Nursing has been monitoring lung sounds for pulmonary edema. Today, mild crackles heard throughout the lung fields. No productive cough, no shortness of breath, no chest pain. Pt reports she is overall feeling fair and without complaints. Denies n/v/d/f/c/cp/sob/ha/abd pain/dizziness. VSS Will obtain CXR due to pt vague complaints.    Past Medical History:  Diagnosis Date  . Breast cancer ([place])   . CHF (congestive heart failure) ([place])   . Chronic headache   . COPD (chronic obstructive pulmonary disease) ([place])   . Diabetes mellitus   .  Hyperlipidemia   . Hypertension    Past Surgical History:  Procedure Laterality Date  . BREAST LUMPECTOMY  2007   left  . COLON SURGERY  1993  . TUBAL LIGATION  1971    No Known Allergies  Allergies as of 03/23/2016   No Known Allergies     Medication List       Accurate as of 03/23/16 11:59 PM. Always use your most recent med list.          acetaminophen 650 MG CR tablet Commonly known as:  TYLENOL Take 650 mg by mouth. Take one every 4 hours as needed for pain per standing ordered.   albuterol (2.5 MG/3ML) 0.083% nebulizer solution Commonly known as:  PROVENTIL Take 2.5 mg by nebulization every 4 (four) hours as needed for wheezing or shortness of breath.   aspirin 81 MG tablet Take 81 mg by mouth daily.   docusate sodium 100 MG capsule Commonly known as:  COLACE Take 100 mg by mouth daily.   DULoxetine 60 MG capsule Commonly known as:  CYMBALTA Take 60 mg by mouth daily.   escitalopram 20 MG tablet Commonly known as:  LEXAPRO Take 20 mg by mouth daily.   ferrous sulfate 325 (65 FE) MG tablet Take 325 mg by mouth daily with breakfast.   Fluticasone-Salmeterol 250-50 MCG/DOSE Aepb Commonly known as:  ADVAIR Inhale 2 puffs into the lungs every 12 (twelve) hours.   FLUTTER Devi Use as directed   furosemide 40 MG tablet Commonly known as:  LASIX Take 40 mg by mouth daily.  gabapentin 400 MG capsule Commonly known as:  NEURONTIN Take 400 mg by mouth 3 (three) times daily.   glipiZIDE-metformin 2.5-500 MG tablet Commonly known as:  METAGLIP Take 1 tablet by mouth daily.   LORazepam 1 MG tablet Commonly known as:  ATIVAN Take one tablet by mouth every night at bedtime for anxiety   magnesium oxide 400 MG tablet Commonly known as:  MAG-OX Take 400 mg by mouth 2 (two) times daily.   meloxicam 7.5 MG tablet Commonly known as:  MOBIC Take 7.5 mg by mouth daily.   metoprolol tartrate 25 MG tablet Commonly known as:  LOPRESSOR Take 25 mg by  mouth 2 (two) times daily.   multivitamin tablet Take 1 tablet by mouth daily.   nystatin powder Commonly known as:  MYCOSTATIN/NYSTOP Apply 1 application topically to abdominal folds daily for redness and irritation.   OXYGEN Inhale 2 L into the lungs continuous. At all times for COPD   pantoprazole 40 MG tablet Commonly known as:  PROTONIX Take 20 mg by mouth 2 (two) times daily.   potassium chloride 10 MEQ tablet Commonly known as:  K-DUR,KLOR-CON Take 10 mEq by mouth 2 (two) times daily.   sucralfate 1 g tablet Commonly known as:  CARAFATE Take 1 g by mouth 4 (four) times daily -  with meals and at bedtime.   THROAT LOZENGES MT Use as directed in the mouth or throat. Take every 6 hours as needed for dry mouth.   tiotropium 18 MCG inhalation capsule Commonly known as:  SPIRIVA Place 18 mcg into inhaler and inhale 2 (two) times daily.   trandolapril 4 MG tablet Commonly known as:  MAVIK Take 2 mg by mouth daily.   traZODone 100 MG tablet Commonly known as:  DESYREL Take 1 tablet (100 mg total) by mouth at bedtime.   vitamin B-12 1000 MCG tablet Commonly known as:  CYANOCOBALAMIN Take 1,000 mcg by mouth daily.       Review of Systems  Constitutional: Negative for activity change, appetite change, chills, diaphoresis and fever.  HENT: Negative for congestion, sneezing, sore throat, trouble swallowing and voice change.   Eyes: Negative for pain, redness and visual disturbance.  Respiratory: Positive for cough. Negative for apnea, choking, chest tightness, shortness of breath and wheezing.   Cardiovascular: Negative for chest pain, palpitations and leg swelling.  Gastrointestinal: Negative for abdominal distention, abdominal pain, constipation, diarrhea and nausea.  Genitourinary: Negative for difficulty urinating, dysuria, frequency and urgency.  Musculoskeletal: Negative for back pain, gait problem and myalgias. Arthralgias: typical arthritis.  Skin: Negative  for color change, pallor, rash and wound.  Neurological: Negative for dizziness, tremors, syncope, speech difficulty, weakness, numbness and headaches.  Psychiatric/Behavioral: Negative for agitation and behavioral problems.  All other systems reviewed and are negative.   Immunization History  Administered Date(s) Administered  . Influenza Split 10/15/2013  . Influenza, High Dose Seasonal PF 12/05/2012  . PPD Test 10/09/2015  . Pneumococcal Polysaccharide-23 05/16/2010   Pertinent  Health Maintenance Due  Topic Date Due  . FOOT EXAM  03/23/1946  . OPHTHALMOLOGY EXAM  03/23/1946  . DEXA SCAN  03/22/2001  . PNA vac Low Risk Adult (2 of 2 - PCV13) 05/16/2011  . INFLUENZA VACCINE  08/04/2016  . HEMOGLOBIN A1C  08/11/2016   Fall Risk  04/05/2016 12/15/2015 10/07/2015 09/09/2015  Falls in the past year? No No Yes Yes  Number falls in past yr: - - 2 or more 2 or more  Injury with Fall? - -  No No  Risk Factor Category  - - - High Fall Risk  Risk for fall due to : - - - History of fall(s)  Follow up - - Education provided;Falls evaluation completed Education provided   Functional Status Survey:    Vitals:   03/18/16 2000  BP: (!) 145/57  Pulse: 70  Resp: 18  Temp: 98.1 F (36.7 C)  SpO2: 96%   There is no height or weight on file to calculate BMI. Physical Exam  Constitutional: She is oriented to person, place, and time. Vital signs are normal. She appears well-developed and well-nourished. She is active and cooperative. Nasal cannula in place.  Morbid obese in no acute distress   HENT:  Head: Normocephalic.  Mouth/Throat: Oropharynx is clear and moist. No oropharyngeal exudate.  Eyes: Conjunctivae and EOM are normal. Pupils are equal, round, and reactive to light. Right eye exhibits no discharge. Left eye exhibits no discharge. No scleral icterus.  Neck: Normal range of motion. No JVD present. No thyromegaly present.  Cardiovascular: Normal rate, regular rhythm and intact distal  pulses.  Exam reveals no gallop, no distant heart sounds and no friction rub.   No murmur heard. Pulses:      Dorsalis pedis pulses are 2+ on the right side, and 2+ on the left side.  Pulmonary/Chest: Effort normal. No respiratory distress. She has no decreased breath sounds. She has no wheezes. She has no rhonchi. She has rales in the right upper field, the right middle field, the right lower field, the left upper field, the left middle field and the left lower field.  Oxygen 2 liters [place] in place.   Abdominal: Soft. Bowel sounds are normal. She exhibits no distension. There is no tenderness. There is no rebound and no guarding.  Musculoskeletal: Normal range of motion. She exhibits no edema, tenderness or deformity.  Moves x 4 extremities.Unsteady gait   Lymphadenopathy:    She has no cervical adenopathy.  Neurological: She is alert and oriented to person, place, and time.  Skin: Skin is warm and dry. No rash noted. No erythema. No pallor.  Psychiatric: She has a normal mood and affect.    Labs reviewed:  Recent Labs  02/12/16 1020  02/19/16 0600 03/05/16 0725 03/25/16 0520  NA 119*  < > 128* 134* 133*  K 5.2*  < > 4.4 4.4 4.6  CL 81*  < > 88* 94* 90*  CO2 30  < > 33* 32 34*  GLUCOSE 123*  < > 96 122* 89  BUN 15  < > _0 CREATININE 0.78  < > 0.46 0.53 0.44  CALCIUM 8.3*  < > 8.5* 8.4* 8.7*  MG 1.5*  --   --   --   --   < > = values in this interval not displayed.  Recent Labs  02/12/16 1020 03/05/16 0725 03/25/16 0520  AST _1 ALT 14 16 11*  ALKPHOS 85 58 54  BILITOT 0.5 0.7 0.6  PROT 5.4* 4.8* 5.4*  ALBUMIN 2.4* 2.3* 2.8*    Recent Labs  02/19/16 0600 03/05/16 0725 03/25/16 0520  WBC 7.5 6.7 7.2  NEUTROABS 4.8 4.2 4.5  HGB 9.9* 9.0* 9.6*  HCT 29.1* 26.8* 28.9*  MCV 83.4 85.5 87.7  PLT 272 336 253   Lab Results  Component Value Date   TSH 2.998 02/12/2016   Lab Results  Component Value Date   HGBA1C 5.6 02/12/2016   Lab Results  Component Value Date   CHOL 106 02/12/2016   HDL 42 02/12/2016   LDLCALC 49 02/12/2016   TRIG 74 02/12/2016   CHOLHDL 2.5 02/12/2016    Significant Diagnostic Results in last 30 days:  No results found.  Assessment/Plan 1. Pneumonia  Labs  CXR  Azithromycin 250 mg- 2 tablets today, then 1 tablet daily x 4 days  Monitor for further complications  Family/ staff Communication:   Total Time:  Documentation:  Face to Face:  Family/Phone:   Labs/tests ordered:  Cbc, met c, 2- view cxr  Medication list reviewed and assessed for continued appropriateness.  Vikki Ports, NP-C Geriatrics [facility] [phone], [place] [address] Cell Phone (Mon-Fri 8am-5pm):  [phone] On Call:  [phone] & follow prompts after 5pm & weekends Office Phone:  [phone] Office Fax:  [phone]

## 2016-04-05 NOTE — Progress Notes (Signed)
This encounter was created in error - please disregard.

## 2016-04-05 NOTE — Progress Notes (Signed)
Patient ID: Kathryn Hayes, female    DOB: 01-Jan-1935, 82 y.o.   MRN: [id number]  HPI Ms Ports is a 82 y/o female with a history of COPD, diabetes, hyperlipidemia, HTN, breast cancer and heart failure with a preserved ejection fraction.   Last echocardiogram was done 08/16/15 which showed an EF of 45-50%. No valvular abnormalities.   Being seen regularly by NP at SNF for chronic disease states. Received IM furosemide 40 mg x 1 on 02/18/16 for mild pulmonary edema. Last admission at [facility] was 09/21/15 for a UTI and exacerbation of her heart failure. She was diuresed and treated with antibiotics. Oxygen was continued.   Today she presents for a follow-up visit with fatigue and shortness of breath while sitting. No swelling in her legs/abdomen. Does wear her oxygen at 2L around the clock along with CPAP on a nightly basis. Not doing physical therapy anymore due to insurance and is not currently walking at all due to neuropathy, says she isn't able to hold herself up. Complains of a cough today that she's had 'forever' and per Kasa note in December, could scheduled nebulizer but order is still PRN. Says she had PNA last week/week before and was treated with course of abx. Currently residing at [facility].  Past Medical History:  Diagnosis Date  . Breast cancer ([place])   . CHF (congestive heart failure) ([place])   . Chronic headache   . COPD (chronic obstructive pulmonary disease) ([place])   . Diabetes mellitus   . Hyperlipidemia   . Hypertension    Past Surgical History:  Procedure Laterality Date  . BREAST LUMPECTOMY  2007   left  . COLON SURGERY  1993  . TUBAL LIGATION  1971   Family History  Problem Relation Age of Onset  . Factor V Leiden deficiency Sister   . Breast cancer Mother   . Colon cancer Mother    Social History  Substance Use Topics  . Smoking status: Former Smoker    Packs/day: 1.50    Years: 50.00    Types: Cigarettes    Quit date: 10/05/2010  . Smokeless tobacco: Never Used   . Alcohol use No   No Known Allergies Prior to Admission medications   Medication Sig Start Date End Date Taking? Authorizing Provider  acetaminophen (TYLENOL) 650 MG CR tablet Take 650 mg by mouth. Take one every 4 hours as needed for pain per standing ordered.   Yes Historical Provider, MD  albuterol (PROVENTIL) (2.5 MG/3ML) 0.083% nebulizer solution Take 2.5 mg by nebulization every 4 (four) hours as needed for wheezing or shortness of breath.    Yes Historical Provider, MD  alum & mag hydroxide-simeth (MAALOX PLUS) 400-400-40 MG/5ML suspension Take 30 mLs by mouth as needed for indigestion.   Yes Historical Provider, MD  Amino Acids-Protein Hydrolys (FEEDING SUPPLEMENT, PRO-STAT SUGAR FREE 64,) LIQD Take 30 mLs by mouth daily.   Yes Historical Provider, MD  aspirin 81 MG tablet Take 81 mg by mouth daily.    Yes Historical Provider, MD  Cholecalciferol (VITAMIN D3) 2000 units TABS Take 1 tablet by mouth daily.   Yes Historical Provider, MD  divalproex (DEPAKOTE SPRINKLE) 125 MG capsule Take 125 mg by mouth 2 (two) times daily.   Yes Historical Provider, MD  docusate sodium (COLACE) 100 MG capsule Take 100 mg by mouth daily.    Yes Historical Provider, MD  DULoxetine (CYMBALTA) 60 MG capsule Take 60 mg by mouth daily.    Yes Historical Provider, MD  Fluticasone-Salmeterol (ADVAIR) 250-50 MCG/DOSE AEPB Inhale 2 puffs into the lungs every 12 (twelve) hours.    Yes Historical Provider, MD  furosemide (LASIX) 40 MG tablet Take 40 mg by mouth daily.   Yes Historical Provider, MD  gabapentin (NEURONTIN) 400 MG capsule Take 400 mg by mouth 3 (three) times daily.   Yes Historical Provider, MD  glipiZIDE-metformin (METAGLIP) 2.5-500 MG tablet Take 1 tablet by mouth daily.    Yes Historical Provider, MD  Hexylresorcinol (THROAT LOZENGES MT) Use as directed in the mouth or throat. Take every 6 hours as needed for dry mouth.   Yes Historical Provider, MD  Ipratropium-Albuterol (COMBIVENT RESPIMAT) 20-100  MCG/ACT AERS respimat Inhale 1 puff into the lungs every 6 (six) hours.   Yes Historical Provider, MD  ketotifen (ZADITOR) 0.025 % ophthalmic solution Place 1 drop into both eyes 2 (two) times daily.   Yes Historical Provider, MD  LORazepam (ATIVAN) 1 MG tablet Take one tablet by mouth every night at bedtime for anxiety 01/15/16  Yes Tiffany L Reed, DO  magnesium oxide (MAG-OX) 400 MG tablet Take 400 mg by mouth 2 (two) times daily.    Yes Historical Provider, MD  Melatonin-Pyridoxine (MELATIN PO) Take 1 tablet by mouth at bedtime.    Yes Historical Provider, MD  meloxicam (MOBIC) 7.5 MG tablet Take 7.5 mg by mouth daily.   Yes Historical Provider, MD  metoprolol tartrate (LOPRESSOR) 25 MG tablet Take 25 mg by mouth 2 (two) times daily.    Yes Historical Provider, MD  Multiple Vitamin (MULTIVITAMIN) tablet Take 1 tablet by mouth daily.   Yes Historical Provider, MD  nystatin (MYCOSTATIN/NYSTOP) powder Apply 1 application topically to abdominal folds daily for redness and irritation.   Yes Historical Provider, MD  OXYGEN Inhale 2 L into the lungs continuous. At all times for COPD    Yes Historical Provider, MD  pantoprazole (PROTONIX) 40 MG tablet Take 20 mg by mouth 2 (two) times daily.    Yes Historical Provider, MD  potassium chloride (K-DUR,KLOR-CON) 10 MEQ tablet Take 10 mEq by mouth 2 (two) times daily.   Yes Historical Provider, MD  senna-docusate (SENOKOT-S) 8.6-50 MG tablet Take 1 tablet by mouth daily.   Yes Historical Provider, MD  Skin Protectants, Misc. (EUCERIN) cream Apply 1 application topically as needed for dry skin.   Yes Historical Provider, MD  sucralfate (CARAFATE) 1 G tablet Take 1 g by mouth 4 (four) times daily -  with meals and at bedtime.   Yes Historical Provider, MD  trandolapril (MAVIK) 4 MG tablet Take 2 mg by mouth daily.    Yes Historical Provider, MD  traZODone (DESYREL) 100 MG tablet Take 1 tablet (100 mg total) by mouth at bedtime. 08/25/15  Yes Srikar Sudini, MD   escitalopram (LEXAPRO) 20 MG tablet Take 20 mg by mouth daily.     Historical Provider, MD  ferrous sulfate 325 (65 FE) MG tablet Take 325 mg by mouth daily with breakfast.     Historical Provider, MD  Respiratory Therapy Supplies (FLUTTER) DEVI Use as directed 12/10/15   Flora Lipps, MD  tiotropium (SPIRIVA) 18 MCG inhalation capsule Place 18 mcg into inhaler and inhale 2 (two) times daily.    Historical Provider, MD  vitamin B-12 (CYANOCOBALAMIN) 1000 MCG tablet Take 1,000 mcg by mouth daily.    Historical Provider, MD     Review of Systems  Constitutional: Positive for fatigue. Negative for appetite change.  HENT: Negative for congestion and postnasal drip.  Eyes: Negative.   Respiratory: Positive for cough, chest tightness and shortness of breath. Negative for wheezing.   Cardiovascular: Positive for chest pain. Negative for palpitations and leg swelling.  Gastrointestinal: Positive for nausea. Negative for abdominal distention and vomiting.  Endocrine: Negative.   Genitourinary: Negative.   Musculoskeletal: Negative for back pain and neck pain.  Skin: Negative.   Allergic/Immunologic: Negative.   Neurological: Positive for headaches. Negative for light-headedness.  Hematological: Does not bruise/bleed easily.  Psychiatric/Behavioral: Negative for dysphoric mood and sleep disturbance. The patient is not nervous/anxious.        1 pillow    Physical Exam  Vitals:   04/05/16 0851 04/05/16 1003  BP:  118/60  Pulse: 79   Resp: 20   SpO2: 97%   Height: 4\' 11"  (1.499 m)    Wt Readings from Last 3 Encounters:  02/10/16 202 lb (91.6 kg)  01/13/16 216 lb (98 kg)  01/06/16 216 lb (98 kg)   Lab Results  Component Value Date   CREATININE 0.44 03/25/2016   CREATININE 0.53 03/05/2016   CREATININE 0.46 02/19/2016    Assessment & Plan:  1: Chronic heart failure with preserved ejection fraction- - NYHA Class III - Euvolemic today - Resume daily weights at facility. Weight 214  lbs at SNF 4/1, down 10 lbs from last visit here - salting foods a little, encouraged < 2000 mg Na intake/day - on limitied fluid intake at SNF, drinking mostly water - Continue oxygen at 2L around the clock - Saw Dr. Ubaldo Glassing on 01/06/16. To make appt for cardiology - needs to increase activity. Pt was unable to move herself forward to listen to her lungs. Strongly encouraged exercises >1 x daily. Patient in agreement  2: HTN- - good today (118/60 mm Hg) - says checks at [facility] ~couple times a week - Seen by PCP Ouida Sills) 03/12/16, per note disease states stable  3: COPD- - productive cough that has been going on 'forever' - saw pulmonologist (Kasa) 12/10/15, is to make another appt - using 2L around the clock - Wearing CPAP nightly - no longer on Spiriva - Will schedule albuterol nebs every 6 hours to help with breathing and cough  4: Diabetes-  - A1c on 02/10/16 = 5.6% - checking bid at SNF - FBS this morning 115 mg/dL; last night at 2100 148 mg/dL - Taking glipizide-metformin 2.5-500 mg daily, decreased recently from BID due to improving glucose - encouraged to participate in exercises at SNF  Return here in 1 month or sooner for any questions/problems before then.   Darrow Bussing, PharmD Pharmacy Resident 04/05/2016 10:08 AM

## 2016-04-06 DIAGNOSIS — E119 Type 2 diabetes mellitus without complications: Secondary | ICD-10-CM | POA: Diagnosis not present

## 2016-04-06 LAB — GLUCOSE, CAPILLARY
Glucose-Capillary: 126 mg/dL — ABNORMAL HIGH (ref 65–99)
Glucose-Capillary: 103 mg/dL — ABNORMAL HIGH (ref 65–99)

## 2016-04-07 LAB — GLUCOSE, CAPILLARY
GLUCOSE-CAPILLARY: 168 mg/dL — AB (ref 65–99)
Glucose-Capillary: 121 mg/dL — ABNORMAL HIGH (ref 65–99)

## 2016-04-08 DIAGNOSIS — E119 Type 2 diabetes mellitus without complications: Secondary | ICD-10-CM | POA: Diagnosis not present

## 2016-04-08 LAB — GLUCOSE, CAPILLARY
GLUCOSE-CAPILLARY: 116 mg/dL — AB (ref 65–99)
Glucose-Capillary: 152 mg/dL — ABNORMAL HIGH (ref 65–99)

## 2016-04-09 DIAGNOSIS — E119 Type 2 diabetes mellitus without complications: Secondary | ICD-10-CM | POA: Diagnosis not present

## 2016-04-09 LAB — GLUCOSE, CAPILLARY
GLUCOSE-CAPILLARY: 121 mg/dL — AB (ref 65–99)
Glucose-Capillary: 126 mg/dL — ABNORMAL HIGH (ref 65–99)

## 2016-04-12 DIAGNOSIS — E119 Type 2 diabetes mellitus without complications: Secondary | ICD-10-CM | POA: Diagnosis not present

## 2016-04-12 LAB — GLUCOSE, CAPILLARY
GLUCOSE-CAPILLARY: 113 mg/dL — AB (ref 65–99)
GLUCOSE-CAPILLARY: 147 mg/dL — AB (ref 65–99)
Glucose-Capillary: 137 mg/dL — ABNORMAL HIGH (ref 65–99)
Glucose-Capillary: 119 mg/dL — ABNORMAL HIGH (ref 65–99)
Glucose-Capillary: 126 mg/dL — ABNORMAL HIGH (ref 65–99)
Glucose-Capillary: 102 mg/dL — ABNORMAL HIGH (ref 65–99)

## 2016-04-13 DIAGNOSIS — E119 Type 2 diabetes mellitus without complications: Secondary | ICD-10-CM | POA: Diagnosis not present

## 2016-04-13 LAB — GLUCOSE, CAPILLARY
Glucose-Capillary: 122 mg/dL — ABNORMAL HIGH (ref 65–99)
Glucose-Capillary: 94 mg/dL (ref 65–99)

## 2016-04-13 NOTE — Progress Notes (Signed)
Location:      Place of Service:  SNF (31) Provider:  Toni Arthurs, NP-C  [name], MD  Patient Care Team: Juluis Pitch, MD as PCP - General (Family Medicine) Alisa Graff, FNP as Nurse Practitioner (Family Medicine) Teodoro Spray, MD as Consulting Physician (Cardiology) Flora Lipps, MD as Consulting Physician (Pulmonary Disease)  Extended Emergency Contact Information Primary Emergency Contact: [name],[name] Address: [place], [place] [address] Kathryn Hayes of [place] Phone: [phone] Mobile Phone: [phone] Relation: Daughter Secondary Emergency Contact: Stockham,[name] Address: [address]          [place], [place] [address] Kathryn Hayes of [place] Phone: [phone] Relation: Son  Code Status:  full Goals of care: Advanced Directive information Advanced Directives 04/05/2016  Does Patient Have a Medical Advance Directive? Yes  Type of Paramedic of [place];Living will  Does patient want to make changes to medical advance directive? -  Copy of [place] in Chart? -  Would patient like information on creating a medical advance directive? -     Chief Complaint  Patient presents with  . Acute Visit    HPI:  Pt is a 82 y.o. female seen today for an acute visit for COPD exacerbation. Pt had had a non-productive cough over the past several weeks. Nursing has been monitoring lung sounds for pulmonary edema. Today,diminished lung sounds heard throughout the lung fields. Intermittent productive cough, no shortness of breath, no chest pain, no edema. Pt reports she is overall feeling fair and without complaints. Denies n/v/d/f/c/cp/sob/ha/abd pain/dizziness. VSS.    Past Medical History:  Diagnosis Date  . Breast cancer ([place])   . CHF (congestive heart failure) ([place])   . Chronic headache   . COPD (chronic obstructive pulmonary disease) ([place])   . Diabetes mellitus   .  Hyperlipidemia   . Hypertension    Past Surgical History:  Procedure Laterality Date  . BREAST LUMPECTOMY  2007   left  . COLON SURGERY  1993  . TUBAL LIGATION  1971    No Known Allergies  Allergies as of 04/01/2016   No Known Allergies     Medication List       Accurate as of 04/01/16 11:59 PM. Always use your most recent med list.          acetaminophen 650 MG CR tablet Commonly known as:  TYLENOL Take 650 mg by mouth. Take one every 4 hours as needed for pain per standing ordered.   albuterol (2.5 MG/3ML) 0.083% nebulizer solution Commonly known as:  PROVENTIL Take 2.5 mg by nebulization every 4 (four) hours as needed for wheezing or shortness of breath.   aspirin 81 MG tablet Take 81 mg by mouth daily.   docusate sodium 100 MG capsule Commonly known as:  COLACE Take 100 mg by mouth daily.   DULoxetine 60 MG capsule Commonly known as:  CYMBALTA Take 60 mg by mouth daily.   escitalopram 20 MG tablet Commonly known as:  LEXAPRO Take 20 mg by mouth daily.   ferrous sulfate 325 (65 FE) MG tablet Take 325 mg by mouth daily with breakfast.   Fluticasone-Salmeterol 250-50 MCG/DOSE Aepb Commonly known as:  ADVAIR Inhale 2 puffs into the lungs every 12 (twelve) hours.   FLUTTER Devi Use as directed   furosemide 40 MG tablet Commonly known as:  LASIX Take 40 mg by mouth daily.   gabapentin 400 MG capsule  Commonly known as:  NEURONTIN Take 400 mg by mouth 3 (three) times daily.   glipiZIDE-metformin 2.5-500 MG tablet Commonly known as:  METAGLIP Take 1 tablet by mouth daily.   LORazepam 1 MG tablet Commonly known as:  ATIVAN Take one tablet by mouth every night at bedtime for anxiety   magnesium oxide 400 MG tablet Commonly known as:  MAG-OX Take 400 mg by mouth 2 (two) times daily.   meloxicam 7.5 MG tablet Commonly known as:  MOBIC Take 7.5 mg by mouth daily.   metoprolol tartrate 25 MG tablet Commonly known as:  LOPRESSOR Take 25 mg by  mouth 2 (two) times daily.   multivitamin tablet Take 1 tablet by mouth daily.   nystatin powder Commonly known as:  MYCOSTATIN/NYSTOP Apply 1 application topically to abdominal folds daily for redness and irritation.   OXYGEN Inhale 2 L into the lungs continuous. At all times for COPD   pantoprazole 40 MG tablet Commonly known as:  PROTONIX Take 20 mg by mouth 2 (two) times daily.   potassium chloride 10 MEQ tablet Commonly known as:  K-DUR,KLOR-CON Take 10 mEq by mouth 2 (two) times daily.   sucralfate 1 g tablet Commonly known as:  CARAFATE Take 1 g by mouth 4 (four) times daily -  with meals and at bedtime.   THROAT LOZENGES MT Use as directed in the mouth or throat. Take every 6 hours as needed for dry mouth.   tiotropium 18 MCG inhalation capsule Commonly known as:  SPIRIVA Place 18 mcg into inhaler and inhale 2 (two) times daily.   trandolapril 4 MG tablet Commonly known as:  MAVIK Take 2 mg by mouth daily.   traZODone 100 MG tablet Commonly known as:  DESYREL Take 1 tablet (100 mg total) by mouth at bedtime.   vitamin B-12 1000 MCG tablet Commonly known as:  CYANOCOBALAMIN Take 1,000 mcg by mouth daily.       Review of Systems  Constitutional: Negative for activity change, appetite change, chills, diaphoresis and fever.  HENT: Negative for congestion, sneezing, sore throat, trouble swallowing and voice change.   Eyes: Negative for pain, redness and visual disturbance.  Respiratory: Positive for cough. Negative for apnea, choking, chest tightness, shortness of breath and wheezing.   Cardiovascular: Negative for chest pain, palpitations and leg swelling.  Gastrointestinal: Negative for abdominal distention, abdominal pain, constipation, diarrhea and nausea.  Genitourinary: Negative for difficulty urinating, dysuria, frequency and urgency.  Musculoskeletal: Negative for back pain, gait problem and myalgias. Arthralgias: typical arthritis.  Skin: Negative  for color change, pallor, rash and wound.  Neurological: Negative for dizziness, tremors, syncope, speech difficulty, weakness, numbness and headaches.  Psychiatric/Behavioral: Negative for agitation and behavioral problems.  All other systems reviewed and are negative.   Immunization History  Administered Date(s) Administered  . Influenza Split 10/15/2013  . Influenza, High Dose Seasonal PF 12/05/2012  . PPD Test 10/09/2015  . Pneumococcal Polysaccharide-23 05/16/2010   Pertinent  Health Maintenance Due  Topic Date Due  . FOOT EXAM  03/23/1944  . OPHTHALMOLOGY EXAM  03/23/1944  . DEXA SCAN  03/22/2001  . PNA vac Low Risk Adult (2 of 2 - PCV13) 05/16/2011  . INFLUENZA VACCINE  08/04/2016  . HEMOGLOBIN A1C  08/11/2016   Fall Risk  04/05/2016 12/15/2015 10/07/2015 09/09/2015  Falls in the past year? No No Yes Yes  Number falls in past yr: - - 2 or more 2 or more  Injury with Fall? - - No No  Risk Factor Category  - - - High Fall Risk  Risk for fall due to : - - - History of fall(s)  Follow up - - Education provided;Falls evaluation completed Education provided   Functional Status Survey:    Vitals:   03/31/16 1820  BP: (!) 115/55  Resp: 18  Temp: 98.1 F (36.7 C)  Weight: 214 lb (97.1 kg)   Body mass index is 43.22 kg/m. Physical Exam  Constitutional: She is oriented to person, place, and time. Vital signs are normal. She appears well-developed and well-nourished. She is active and cooperative. Nasal cannula in place.  Morbid obese in no acute distress   HENT:  Head: Normocephalic.  Mouth/Throat: Oropharynx is clear and moist. No oropharyngeal exudate.  Eyes: Conjunctivae and EOM are normal. Pupils are equal, round, and reactive to light. Right eye exhibits no discharge. Left eye exhibits no discharge. No scleral icterus.  Neck: Normal range of motion. No JVD present. No thyromegaly present.  Cardiovascular: Normal rate, regular rhythm and intact distal pulses.  Exam  reveals no gallop, no distant heart sounds and no friction rub.   No murmur heard. Pulses:      Dorsalis pedis pulses are 2+ on the right side, and 2+ on the left side.  Pulmonary/Chest: Effort normal. No respiratory distress. She has decreased breath sounds. She has no wheezes. She has no rhonchi. She has no rales.  Oxygen 2 liters [place] in place.   Abdominal: Soft. Bowel sounds are normal. She exhibits no distension. There is no tenderness. There is no rebound and no guarding.  Musculoskeletal: Normal range of motion. She exhibits no edema, tenderness or deformity.  Moves x 4 extremities.Unsteady gait   Lymphadenopathy:    She has no cervical adenopathy.  Neurological: She is alert and oriented to person, place, and time.  Skin: Skin is warm and dry. No rash noted. No erythema. No pallor.  Psychiatric: She has a normal mood and affect.    Labs reviewed:  Recent Labs  02/12/16 1020  02/19/16 0600 03/05/16 0725 03/25/16 0520  NA 119*  < > 128* 134* 133*  K 5.2*  < > 4.4 4.4 4.6  CL 81*  < > 88* 94* 90*  CO2 30  < > 33* 32 34*  GLUCOSE 123*  < > 96 122* 89  BUN 15  < > 8 9 13   CREATININE 0.78  < > 0.46 0.53 0.44  CALCIUM 8.3*  < > 8.5* 8.4* 8.7*  MG 1.5*  --   --   --   --   < > = values in this interval not displayed.  Recent Labs  02/12/16 1020 03/05/16 0725 03/25/16 0520  AST 22 20 15   ALT 14 16 11*  ALKPHOS 85 58 54  BILITOT 0.5 0.7 0.6  PROT 5.4* 4.8* 5.4*  ALBUMIN 2.4* 2.3* 2.8*    Recent Labs  02/19/16 0600 03/05/16 0725 03/25/16 0520  WBC 7.5 6.7 7.2  NEUTROABS 4.8 4.2 4.5  HGB 9.9* 9.0* 9.6*  HCT 29.1* 26.8* 28.9*  MCV 83.4 85.5 87.7  PLT 272 336 253   Lab Results  Component Value Date   TSH 2.998 02/12/2016   Lab Results  Component Value Date   HGBA1C 5.6 02/12/2016   Lab Results  Component Value Date   CHOL 106 02/12/2016   HDL 42 02/12/2016   LDLCALC 49 02/12/2016   TRIG 74 02/12/2016   CHOLHDL 2.5 02/12/2016    Significant  Diagnostic Results in  last 30 days:  No results found.  Assessment/Plan 1. Chronic Obstructive Pulmonary Disease  DC Advair diskus  Advair HFA Inhaler 115-21- 2 puffs Q 12 hours with Aerochamber  DC Spiriva  Combivent 20-100 mcg- 1 inhalation QID  Family/ staff Communication:   Total Time:  Documentation:  Face to Face:  Family/Phone:   Labs/tests ordered:  Cbc, met c, 2- view cxr  Medication list reviewed and assessed for continued appropriateness.  Vikki Ports, NP-C Geriatrics [facility] [phone] [place],  [address] Cell Phone (Mon-Fri 8am-5pm):  [phone] On Call:  [phone] & follow prompts after 5pm & weekends Office Phone:  [phone] Office Fax:  [phone]

## 2016-04-14 LAB — GLUCOSE, CAPILLARY
GLUCOSE-CAPILLARY: 158 mg/dL — AB (ref 65–99)
GLUCOSE-CAPILLARY: 93 mg/dL (ref 65–99)

## 2016-04-15 DIAGNOSIS — E119 Type 2 diabetes mellitus without complications: Secondary | ICD-10-CM | POA: Diagnosis not present

## 2016-04-15 LAB — GLUCOSE, CAPILLARY
GLUCOSE-CAPILLARY: 182 mg/dL — AB (ref 65–99)
GLUCOSE-CAPILLARY: 95 mg/dL (ref 65–99)

## 2016-04-16 DIAGNOSIS — E119 Type 2 diabetes mellitus without complications: Secondary | ICD-10-CM | POA: Diagnosis not present

## 2016-04-17 DIAGNOSIS — E119 Type 2 diabetes mellitus without complications: Secondary | ICD-10-CM | POA: Diagnosis not present

## 2016-04-17 LAB — GLUCOSE, CAPILLARY
GLUCOSE-CAPILLARY: 90 mg/dL (ref 65–99)
GLUCOSE-CAPILLARY: 139 mg/dL — AB (ref 65–99)
GLUCOSE-CAPILLARY: 80 mg/dL (ref 65–99)
Glucose-Capillary: 184 mg/dL — ABNORMAL HIGH (ref 65–99)

## 2016-04-18 LAB — GLUCOSE, CAPILLARY
Glucose-Capillary: 111 mg/dL — ABNORMAL HIGH (ref 65–99)
Glucose-Capillary: 91 mg/dL (ref 65–99)

## 2016-04-19 DIAGNOSIS — E119 Type 2 diabetes mellitus without complications: Secondary | ICD-10-CM | POA: Diagnosis not present

## 2016-04-19 LAB — GLUCOSE, CAPILLARY
GLUCOSE-CAPILLARY: 117 mg/dL — AB (ref 65–99)
GLUCOSE-CAPILLARY: 114 mg/dL — AB (ref 65–99)

## 2016-04-20 DIAGNOSIS — E119 Type 2 diabetes mellitus without complications: Secondary | ICD-10-CM | POA: Diagnosis not present

## 2016-04-20 LAB — GLUCOSE, CAPILLARY
Glucose-Capillary: 177 mg/dL — ABNORMAL HIGH (ref 65–99)
Glucose-Capillary: 95 mg/dL (ref 65–99)

## 2016-04-21 LAB — GLUCOSE, CAPILLARY
GLUCOSE-CAPILLARY: 154 mg/dL — AB (ref 65–99)
Glucose-Capillary: 100 mg/dL — ABNORMAL HIGH (ref 65–99)

## 2016-04-22 DIAGNOSIS — E119 Type 2 diabetes mellitus without complications: Secondary | ICD-10-CM | POA: Diagnosis not present

## 2016-04-23 ENCOUNTER — Encounter: Payer: Self-pay | Admitting: Gerontology

## 2016-04-23 LAB — GLUCOSE, CAPILLARY: Glucose-Capillary: 110 mg/dL — ABNORMAL HIGH (ref 65–99)

## 2016-04-26 DIAGNOSIS — E119 Type 2 diabetes mellitus without complications: Secondary | ICD-10-CM | POA: Diagnosis not present

## 2016-04-26 LAB — GLUCOSE, CAPILLARY
GLUCOSE-CAPILLARY: 103 mg/dL — AB (ref 65–99)
GLUCOSE-CAPILLARY: 146 mg/dL — AB (ref 65–99)
GLUCOSE-CAPILLARY: 95 mg/dL (ref 65–99)
GLUCOSE-CAPILLARY: 103 mg/dL — AB (ref 65–99)
Glucose-Capillary: 125 mg/dL — ABNORMAL HIGH (ref 65–99)
Glucose-Capillary: 128 mg/dL — ABNORMAL HIGH (ref 65–99)
Glucose-Capillary: 154 mg/dL — ABNORMAL HIGH (ref 65–99)

## 2016-04-27 LAB — GLUCOSE, CAPILLARY
GLUCOSE-CAPILLARY: 149 mg/dL — AB (ref 65–99)
GLUCOSE-CAPILLARY: 87 mg/dL (ref 65–99)
Glucose-Capillary: 113 mg/dL — ABNORMAL HIGH (ref 65–99)
Glucose-Capillary: 105 mg/dL — ABNORMAL HIGH (ref 65–99)

## 2016-04-28 DIAGNOSIS — E119 Type 2 diabetes mellitus without complications: Secondary | ICD-10-CM | POA: Diagnosis not present

## 2016-04-28 LAB — GLUCOSE, CAPILLARY
Glucose-Capillary: 94 mg/dL (ref 65–99)
Glucose-Capillary: 163 mg/dL — ABNORMAL HIGH (ref 65–99)

## 2016-04-29 DIAGNOSIS — E119 Type 2 diabetes mellitus without complications: Secondary | ICD-10-CM | POA: Diagnosis not present

## 2016-04-29 LAB — GLUCOSE, CAPILLARY
GLUCOSE-CAPILLARY: 109 mg/dL — AB (ref 65–99)
GLUCOSE-CAPILLARY: 162 mg/dL — AB (ref 65–99)

## 2016-04-30 DIAGNOSIS — E119 Type 2 diabetes mellitus without complications: Secondary | ICD-10-CM | POA: Diagnosis not present

## 2016-04-30 LAB — GLUCOSE, CAPILLARY
GLUCOSE-CAPILLARY: 129 mg/dL — AB (ref 65–99)
Glucose-Capillary: 101 mg/dL — ABNORMAL HIGH (ref 65–99)

## 2016-05-01 DIAGNOSIS — E119 Type 2 diabetes mellitus without complications: Secondary | ICD-10-CM | POA: Diagnosis not present

## 2016-05-01 LAB — GLUCOSE, CAPILLARY
GLUCOSE-CAPILLARY: 114 mg/dL — AB (ref 65–99)
Glucose-Capillary: 122 mg/dL — ABNORMAL HIGH (ref 65–99)

## 2016-05-02 DIAGNOSIS — E119 Type 2 diabetes mellitus without complications: Secondary | ICD-10-CM | POA: Diagnosis not present

## 2016-05-02 LAB — GLUCOSE, CAPILLARY
GLUCOSE-CAPILLARY: 105 mg/dL — AB (ref 65–99)
GLUCOSE-CAPILLARY: 166 mg/dL — AB (ref 65–99)

## 2016-05-03 DIAGNOSIS — E119 Type 2 diabetes mellitus without complications: Secondary | ICD-10-CM | POA: Diagnosis not present

## 2016-05-03 LAB — GLUCOSE, CAPILLARY
Glucose-Capillary: 109 mg/dL — ABNORMAL HIGH (ref 65–99)
Glucose-Capillary: 130 mg/dL — ABNORMAL HIGH (ref 65–99)

## 2016-05-04 ENCOUNTER — Encounter
Admission: RE | Admit: 2016-05-04 | Discharge: 2016-05-04 | Disposition: A | Discharge status: Home / Self Care | Payer: Medicare Other | Source: Ambulatory Visit | Attending: Internal Medicine | Admitting: Internal Medicine

## 2016-05-04 DIAGNOSIS — I5022 Chronic systolic (congestive) heart failure: Secondary | ICD-10-CM | POA: Insufficient documentation

## 2016-05-04 LAB — GLUCOSE, CAPILLARY: Glucose-Capillary: 117 mg/dL — ABNORMAL HIGH (ref 65–99)

## 2016-05-05 ENCOUNTER — Ambulatory Visit: Payer: Medicare Other | Admitting: Family

## 2016-05-06 DIAGNOSIS — I5022 Chronic systolic (congestive) heart failure: Secondary | ICD-10-CM | POA: Diagnosis not present

## 2016-05-06 LAB — GLUCOSE, CAPILLARY
GLUCOSE-CAPILLARY: 109 mg/dL — AB (ref 65–99)
Glucose-Capillary: 138 mg/dL — ABNORMAL HIGH (ref 65–99)
Glucose-Capillary: 105 mg/dL — ABNORMAL HIGH (ref 65–99)

## 2016-05-07 DIAGNOSIS — I5022 Chronic systolic (congestive) heart failure: Secondary | ICD-10-CM | POA: Diagnosis not present

## 2016-05-07 LAB — GLUCOSE, CAPILLARY
Glucose-Capillary: 176 mg/dL — ABNORMAL HIGH (ref 65–99)
Glucose-Capillary: 96 mg/dL (ref 65–99)

## 2016-05-10 DIAGNOSIS — I5022 Chronic systolic (congestive) heart failure: Secondary | ICD-10-CM | POA: Diagnosis not present

## 2016-05-10 LAB — GLUCOSE, CAPILLARY
GLUCOSE-CAPILLARY: 184 mg/dL — AB (ref 65–99)
GLUCOSE-CAPILLARY: 103 mg/dL — AB (ref 65–99)
GLUCOSE-CAPILLARY: 149 mg/dL — AB (ref 65–99)
GLUCOSE-CAPILLARY: 107 mg/dL — AB (ref 65–99)
GLUCOSE-CAPILLARY: 168 mg/dL — AB (ref 65–99)
Glucose-Capillary: 118 mg/dL — ABNORMAL HIGH (ref 65–99)

## 2016-05-11 DIAGNOSIS — I5022 Chronic systolic (congestive) heart failure: Secondary | ICD-10-CM | POA: Diagnosis not present

## 2016-05-11 LAB — GLUCOSE, CAPILLARY
Glucose-Capillary: 122 mg/dL — ABNORMAL HIGH (ref 65–99)
Glucose-Capillary: 91 mg/dL (ref 65–99)

## 2016-05-12 LAB — GLUCOSE, CAPILLARY
Glucose-Capillary: 118 mg/dL — ABNORMAL HIGH (ref 65–99)
Glucose-Capillary: 93 mg/dL (ref 65–99)

## 2016-05-13 DIAGNOSIS — I5022 Chronic systolic (congestive) heart failure: Secondary | ICD-10-CM | POA: Diagnosis not present

## 2016-05-13 LAB — GLUCOSE, CAPILLARY: GLUCOSE-CAPILLARY: 114 mg/dL — AB (ref 65–99)

## 2016-05-14 DIAGNOSIS — I5022 Chronic systolic (congestive) heart failure: Secondary | ICD-10-CM | POA: Diagnosis not present

## 2016-05-14 LAB — GLUCOSE, CAPILLARY
GLUCOSE-CAPILLARY: 91 mg/dL (ref 65–99)
Glucose-Capillary: 129 mg/dL — ABNORMAL HIGH (ref 65–99)
Glucose-Capillary: 180 mg/dL — ABNORMAL HIGH (ref 65–99)

## 2016-05-17 ENCOUNTER — Non-Acute Institutional Stay (SKILLED_NURSING_FACILITY): Payer: Medicare Other | Admitting: Gerontology

## 2016-05-17 DIAGNOSIS — K219 Gastro-esophageal reflux disease without esophagitis: Secondary | ICD-10-CM

## 2016-05-17 DIAGNOSIS — G4700 Insomnia, unspecified: Secondary | ICD-10-CM | POA: Diagnosis not present

## 2016-05-17 DIAGNOSIS — R001 Bradycardia, unspecified: Secondary | ICD-10-CM | POA: Diagnosis not present

## 2016-05-17 DIAGNOSIS — E222 Syndrome of inappropriate secretion of antidiuretic hormone: Secondary | ICD-10-CM

## 2016-05-17 DIAGNOSIS — E639 Nutritional deficiency, unspecified: Secondary | ICD-10-CM

## 2016-05-17 DIAGNOSIS — Z9989 Dependence on other enabling machines and devices: Secondary | ICD-10-CM

## 2016-05-17 DIAGNOSIS — Z853 Personal history of malignant neoplasm of breast: Secondary | ICD-10-CM

## 2016-05-17 DIAGNOSIS — F329 Major depressive disorder, single episode, unspecified: Secondary | ICD-10-CM

## 2016-05-17 DIAGNOSIS — F419 Anxiety disorder, unspecified: Secondary | ICD-10-CM | POA: Diagnosis not present

## 2016-05-17 DIAGNOSIS — E1140 Type 2 diabetes mellitus with diabetic neuropathy, unspecified: Secondary | ICD-10-CM

## 2016-05-17 DIAGNOSIS — Z9981 Dependence on supplemental oxygen: Secondary | ICD-10-CM | POA: Diagnosis not present

## 2016-05-17 DIAGNOSIS — G4731 Primary central sleep apnea: Secondary | ICD-10-CM | POA: Diagnosis not present

## 2016-05-17 DIAGNOSIS — Z950 Presence of cardiac pacemaker: Secondary | ICD-10-CM | POA: Diagnosis not present

## 2016-05-17 DIAGNOSIS — K5900 Constipation, unspecified: Secondary | ICD-10-CM

## 2016-05-17 DIAGNOSIS — E559 Vitamin D deficiency, unspecified: Secondary | ICD-10-CM | POA: Diagnosis not present

## 2016-05-17 DIAGNOSIS — J449 Chronic obstructive pulmonary disease, unspecified: Secondary | ICD-10-CM

## 2016-05-17 DIAGNOSIS — I5022 Chronic systolic (congestive) heart failure: Secondary | ICD-10-CM | POA: Diagnosis not present

## 2016-05-17 DIAGNOSIS — G43909 Migraine, unspecified, not intractable, without status migrainosus: Secondary | ICD-10-CM

## 2016-05-17 DIAGNOSIS — E782 Mixed hyperlipidemia: Secondary | ICD-10-CM

## 2016-05-17 DIAGNOSIS — I441 Atrioventricular block, second degree: Secondary | ICD-10-CM | POA: Diagnosis not present

## 2016-05-17 DIAGNOSIS — I110 Hypertensive heart disease with heart failure: Secondary | ICD-10-CM

## 2016-05-17 LAB — GLUCOSE, CAPILLARY
GLUCOSE-CAPILLARY: 143 mg/dL — AB (ref 65–99)
GLUCOSE-CAPILLARY: 118 mg/dL — AB (ref 65–99)
GLUCOSE-CAPILLARY: 107 mg/dL — AB (ref 65–99)
Glucose-Capillary: 94 mg/dL (ref 65–99)
Glucose-Capillary: 138 mg/dL — ABNORMAL HIGH (ref 65–99)
Glucose-Capillary: 156 mg/dL — ABNORMAL HIGH (ref 65–99)

## 2016-05-18 DIAGNOSIS — I5022 Chronic systolic (congestive) heart failure: Secondary | ICD-10-CM | POA: Diagnosis not present

## 2016-05-18 LAB — GLUCOSE, CAPILLARY
Glucose-Capillary: 120 mg/dL — ABNORMAL HIGH (ref 65–99)
Glucose-Capillary: 95 mg/dL (ref 65–99)

## 2016-05-18 NOTE — Progress Notes (Signed)
Location:      Place of Service:  SNF (31) Provider:  Toni Arthurs, NP-C  Juluis Pitch, MD  Patient Care Team: Juluis Pitch, MD as PCP - General (Family Medicine) Alisa Graff, FNP as Nurse Practitioner (Family Medicine) Teodoro Spray, MD as Consulting Physician (Cardiology) Flora Lipps, MD as Consulting Physician (Pulmonary Disease)  Extended Emergency Contact Information Primary Emergency Contact: [name],[name] Address: [place], [place] [address] [name] of [place] Phone: [phone] Mobile Phone: [phone] Relation: Daughter Secondary Emergency Contact: Digio[place],[name] Address: [address]          [place], [place] [address] [name] of [place] Phone: [phone] Relation: Son  Code Status:  full Goals of care: Advanced Directive information Advanced Directives 04/05/2016  Does Patient Have a Medical Advance Directive? Yes  Type of Paramedic of [place];Living will  Does patient want to make changes to medical advance directive? -  Copy of [place] in Chart? -  Would patient like information on creating a medical advance directive? -     Chief Complaint  Patient presents with  . Medical Management of Chronic Issues    HPI:  Pt is a 82 y.o. female seen today for medical management of chronic diseases. Pt has multiple medical co-morbidities. Conditions have been stable in the past month. Pt is resident in skilled nursing. Pt has adapted well. She does not interact with other residents much, but is pleasant. Pt reports she is feeling well at this time. Pt does c/o intermittent, though frequent migraines. She says the pain is in different locations. Nothing makes them better, but tries tylenol for the pain. Reports she has a headache almost everyday. She typically awakens in the morning with them. She was initiated on Melatonin and Depakote for Migraine prophylaxis. Pt does  report an improvement of symptoms. VSS. No other complaints.    Past Medical History:  Diagnosis Date  . Breast cancer ([place])   . CHF (congestive heart failure) ([place])   . Chronic headache   . COPD (chronic obstructive pulmonary disease) ([place])   . Diabetes mellitus   . Hyperlipidemia   . Hypertension    Past Surgical History:  Procedure Laterality Date  . BREAST LUMPECTOMY  2007   left  . COLON SURGERY  1993  . TUBAL LIGATION  1971    No Known Allergies  Allergies as of 05/17/2016   No Known Allergies     Medication List       Accurate as of 05/17/16 11:59 PM. Always use your most recent med list.          acetaminophen 650 MG CR tablet Commonly known as:  TYLENOL Take 650 mg by mouth. Take one every 4 hours as needed for pain per standing ordered.   albuterol (2.5 MG/3ML) 0.083% nebulizer solution Commonly known as:  PROVENTIL Take 2.5 mg by nebulization every 4 (four) hours as needed for wheezing or shortness of breath.   alum & mag hydroxide-simeth 791-505-69 MG/5ML suspension Commonly known as:  MAALOX PLUS Take 30 mLs by mouth as needed for indigestion.   aspirin 81 MG tablet Take 81 mg by mouth daily.   COMBIVENT RESPIMAT 20-100 MCG/ACT Aers respimat Generic drug:  Ipratropium-Albuterol Inhale 1 puff into the lungs every 6 (six) hours.   divalproex 125 MG capsule Commonly known as:  DEPAKOTE SPRINKLE Take 125 mg by mouth 2 (two) times daily.  docusate sodium 100 MG capsule Commonly known as:  COLACE Take 100 mg by mouth daily.   DULoxetine 60 MG capsule Commonly known as:  CYMBALTA Take 60 mg by mouth daily.   escitalopram 20 MG tablet Commonly known as:  LEXAPRO Take 20 mg by mouth daily.   eucerin cream Apply 1 application topically as needed for dry skin.   feeding supplement (PRO-STAT SUGAR FREE 64) Liqd Take 30 mLs by mouth daily.   ferrous sulfate 325 (65 FE) MG tablet Take 325 mg by mouth daily with breakfast.     Fluticasone-Salmeterol 250-50 MCG/DOSE Aepb Commonly known as:  ADVAIR Inhale 2 puffs into the lungs every 12 (twelve) hours.   FLUTTER Devi Use as directed   furosemide 40 MG tablet Commonly known as:  LASIX Take 40 mg by mouth daily.   gabapentin 400 MG capsule Commonly known as:  NEURONTIN Take 400 mg by mouth 3 (three) times daily.   glipiZIDE-metformin 2.5-500 MG tablet Commonly known as:  METAGLIP Take 1 tablet by mouth daily.   ketotifen 0.025 % ophthalmic solution Commonly known as:  ZADITOR Place 1 drop into both eyes 2 (two) times daily.   LORazepam 1 MG tablet Commonly known as:  ATIVAN Take one tablet by mouth every night at bedtime for anxiety   magnesium oxide 400 MG tablet Commonly known as:  MAG-OX Take 400 mg by mouth 2 (two) times daily.   MELATIN PO Take 1 tablet by mouth at bedtime.   meloxicam 7.5 MG tablet Commonly known as:  MOBIC Take 7.5 mg by mouth daily.   metoprolol tartrate 25 MG tablet Commonly known as:  LOPRESSOR Take 25 mg by mouth 2 (two) times daily.   multivitamin tablet Take 1 tablet by mouth daily.   nystatin powder Commonly known as:  MYCOSTATIN/NYSTOP Apply 1 application topically to abdominal folds daily for redness and irritation.   OXYGEN Inhale 2 L into the lungs continuous. At all times for COPD   pantoprazole 40 MG tablet Commonly known as:  PROTONIX Take 20 mg by mouth 2 (two) times daily.   potassium chloride 10 MEQ tablet Commonly known as:  K-DUR,KLOR-CON Take 10 mEq by mouth 2 (two) times daily.   senna-docusate 8.6-50 MG tablet Commonly known as:  Senokot-S Take 1 tablet by mouth daily.   sucralfate 1 g tablet Commonly known as:  CARAFATE Take 1 g by mouth 4 (four) times daily -  with meals and at bedtime.   THROAT LOZENGES MT Use as directed in the mouth or throat. Take every 6 hours as needed for dry mouth.   tiotropium 18 MCG inhalation capsule Commonly known as:  SPIRIVA Place 18 mcg  into inhaler and inhale 2 (two) times daily.   trandolapril 4 MG tablet Commonly known as:  MAVIK Take 2 mg by mouth daily.   traZODone 100 MG tablet Commonly known as:  DESYREL Take 1 tablet (100 mg total) by mouth at bedtime.   vitamin B-12 1000 MCG tablet Commonly known as:  CYANOCOBALAMIN Take 1,000 mcg by mouth daily.   Vitamin D3 2000 units Tabs Take 1 tablet by mouth daily.       Review of Systems  Constitutional: Negative for activity change, appetite change, chills, diaphoresis and fever.  HENT: Negative for congestion, sneezing, sore throat, trouble swallowing and voice change.   Eyes: Negative for pain, redness and visual disturbance.  Respiratory: Negative for apnea, cough, choking, chest tightness, shortness of breath and wheezing.   Cardiovascular:  Negative for chest pain, palpitations and leg swelling.  Gastrointestinal: Negative for abdominal distention, abdominal pain, constipation, diarrhea and nausea.  Genitourinary: Negative for difficulty urinating, dysuria, frequency and urgency.  Musculoskeletal: Negative for back pain, gait problem and myalgias. Arthralgias: typical arthritis.  Skin: Negative for color change, pallor, rash and wound.  Neurological: Negative for dizziness, tremors, syncope, speech difficulty, weakness, numbness and headaches.  Psychiatric/Behavioral: Negative for agitation and behavioral problems.  All other systems reviewed and are negative.   Immunization History  Administered Date(s) Administered  . Influenza Split 10/15/2013  . Influenza, High Dose Seasonal PF 12/05/2012  . PPD Test 10/09/2015  . Pneumococcal Polysaccharide-23 05/16/2010   Pertinent  Health Maintenance Due  Topic Date Due  . FOOT EXAM  03/23/1944  . OPHTHALMOLOGY EXAM  03/23/1944  . DEXA SCAN  03/22/2001  . PNA vac Low Risk Adult (2 of 2 - PCV13) 05/16/2011  . INFLUENZA VACCINE  08/04/2016  . HEMOGLOBIN A1C  08/11/2016   Fall Risk  04/05/2016 12/15/2015  10/07/2015 09/09/2015  Falls in the past year? No No Yes Yes  Number falls in past yr: - - 2 or more 2 or more  Injury with Fall? - - No No  Risk Factor Category  - - - High Fall Risk  Risk for fall due to : - - - History of fall(s)  Follow up - - Education provided;Falls evaluation completed Education provided   Functional Status Survey:    Vitals:   05/17/16 1100  Pulse: 68  SpO2: 97%  Weight: 214 lb (97.1 kg)   Body mass index is 43.22 kg/m. Physical Exam  Constitutional: She is oriented to person, place, and time. Vital signs are normal. She appears well-developed and well-nourished. She is active and cooperative. Nasal cannula in place.  Morbid obese in no acute distress   HENT:  Head: Normocephalic.  Mouth/Throat: Oropharynx is clear and moist. No oropharyngeal exudate.  Eyes: Conjunctivae and EOM are normal. Pupils are equal, round, and reactive to light. Right eye exhibits no discharge. Left eye exhibits no discharge. No scleral icterus.  Neck: Normal range of motion. No JVD present. No thyromegaly present.  Cardiovascular: Normal rate, regular rhythm and intact distal pulses.  Exam reveals no gallop, no distant heart sounds and no friction rub.   No murmur heard. Pulses:      Dorsalis pedis pulses are 2+ on the right side, and 2+ on the left side.  Pulmonary/Chest: Effort normal. No respiratory distress. She has no decreased breath sounds. She has no wheezes. She has no rhonchi. She has no rales.  Oxygen 2 liters [employer] in place.   Abdominal: Soft. Bowel sounds are normal. She exhibits no distension. There is no tenderness. There is no rebound and no guarding.  Musculoskeletal: Normal range of motion. She exhibits no edema, tenderness or deformity.  Moves x 4 extremities.Unsteady gait   Lymphadenopathy:    She has no cervical adenopathy.  Neurological: She is alert and oriented to person, place, and time.  Skin: Skin is warm and dry. No rash noted. No erythema. No pallor.   Psychiatric: She has a normal mood and affect.    Labs reviewed:  Recent Labs  02/12/16 1020  02/19/16 0600 03/05/16 0725 03/25/16 0520  NA 119*  < > 128* 134* 133*  K 5.2*  < > 4.4 4.4 4.6  CL 81*  < > 88* 94* 90*  CO2 30  < > 33* 32 34*  GLUCOSE 123*  < >  96 122* 89  BUN 15  < > 8 9 13   CREATININE 0.78  < > 0.46 0.53 0.44  CALCIUM 8.3*  < > 8.5* 8.4* 8.7*  MG 1.5*  --   --   --   --   < > = values in this interval not displayed.  Recent Labs  02/12/16 1020 03/05/16 0725 03/25/16 0520  AST 22 20 15   ALT 14 16 11*  ALKPHOS 85 58 54  BILITOT 0.5 0.7 0.6  PROT 5.4* 4.8* 5.4*  ALBUMIN 2.4* 2.3* 2.8*    Recent Labs  02/19/16 0600 03/05/16 0725 03/25/16 0520  WBC 7.5 6.7 7.2  NEUTROABS 4.8 4.2 4.5  HGB 9.9* 9.0* 9.6*  HCT 29.1* 26.8* 28.9*  MCV 83.4 85.5 87.7  PLT 272 336 253   Lab Results  Component Value Date   TSH 2.998 02/12/2016   Lab Results  Component Value Date   HGBA1C 5.6 02/12/2016   Lab Results  Component Value Date   CHOL 106 02/12/2016   HDL 42 02/12/2016   LDLCALC 49 02/12/2016   TRIG 74 02/12/2016   CHOLHDL 2.5 02/12/2016    Significant Diagnostic Results in last 30 days:  No results found.  Assessment/Plan 1. Dependence on supplemental oxygen  Continue O2 2L [place]  2. Primary central sleep apnea  CPAP Q HS  3. Dependence on other enabling machines and devices  Cleanse CPAP mask, tubing, chamber water and small amount of dish soap once a week. Dry completely  4. Presence of cardiac pacemaker  Stable   5. Bradycardia, sinus  Stable   6. Second degree AV block  Stable   7. Single current episode of major depressive disorder, unspecified depression episode severity  Continue Duloxetine 60 mg po Q Day  8. Anxiety disorder, unspecified type  Continue Lorazepam 1 mg po Q HS  9. Vitamin D deficiency  Stable  Continue Cholecalciferol 2,000 units po Q Day  10. Insomnia, unspecified type  Stable  Continue  Trazodone 100 mg po Q HS  11. Gastroesophageal reflux disease without esophagitis  Stable  Continue Protonix 20 mg po BID  Decrease Sucralfate to 1 gram BID- tapering dose off  12. Syndrome of inappropriate antidiuretic hormone (HCC)  Continue No sodium restriction diet  Stable   13. Migraine without status migrainosus, not intractable, unspecified migraine type  Stable  Melatonin 10 mg po Q HS migraine prophylaxis  Continue Depakote Sprinkles 125 mg po BID for headache prophylaxis  14. Personal history of other malignant neoplasm of large intestine (CODE)  stable  15. Personal history of malignant neoplasm of breast  Stable   16. Mixed hyperlipidemia  Stable  No medications necessary  Lipitor DC'ed  17. Type 2 diabetes, controlled, with neuropathy (HCC)  Stable  Continue glipizide-metformin tablet; 2.5-500 mg; amt: 1 tablet po Q Day  FSBS BID  Continue Gabapentin 400 mg po TID  Continue Mobix 7.5 mg po Q Day  18. Chronic obstructive pulmonary disease, unspecified COPD type (HCC)  Stable   Continue albuterol sulfate solution for nebulization; 2.5 mg /3 mL (0.083 %) Q 6 hours per instructions from [facility] Failure [facility]  Continue Advair Diskus (fluticasone-salmeterol) blister with device; 250-50 mcg/dose 1 puff Q 12 hours  Continue Combivent Respimat 20-100- 1 inhalation QID  19. Hypertensive heart disease with CHF (congestive heart failure) (HCC)  Stable  Continue Lasix 40 mg po Q Day  Continue Metoprolol 25 mg po BID  Continue trandolapril 2 mg PO Q Day  Continue Aspir-81 (aspirin) [OTC] tablet,delayed release (DR/EC); 81 mg po Q Day  Continue potassium chloride tablet extended release; 10 mEq; amt: 1 tablet po Q Day  20. Nutritional Deficiency  Continue Pro-Stat 30 mL PO Q Day  Continue Multivitamin po Q Day  Continue Magnesium Oxide 400 mg po BID  21. Constipation  Continue Colace 100 mg po Q Day  Continue Senna-S po BID  Milk  of Mag. 30 ml po Q day prn  Family/ staff Communication:   Total Time:  Documentation:  Face to Face:  Family/Phone: Spoke to daughter for 15 minutes via telephone   Labs/tests ordered:    Medication list reviewed and assessed for continued appropriateness. Monthly medication orders reviewed and signed.  Vikki Ports, NP-C Geriatrics [facility] [phone], [place] [address] Cell Phone (Mon-Fri 8am-5pm):  [phone] On Call:  [phone] & follow prompts after 5pm & weekends Office Phone:  [phone] Office Fax:  [phone]

## 2016-05-19 LAB — GLUCOSE, CAPILLARY
Glucose-Capillary: 129 mg/dL — ABNORMAL HIGH (ref 65–99)
Glucose-Capillary: 106 mg/dL — ABNORMAL HIGH (ref 65–99)

## 2016-05-20 DIAGNOSIS — I5022 Chronic systolic (congestive) heart failure: Secondary | ICD-10-CM | POA: Diagnosis not present

## 2016-05-20 LAB — GLUCOSE, CAPILLARY
GLUCOSE-CAPILLARY: 157 mg/dL — AB (ref 65–99)
GLUCOSE-CAPILLARY: 97 mg/dL (ref 65–99)

## 2016-05-21 DIAGNOSIS — I5022 Chronic systolic (congestive) heart failure: Secondary | ICD-10-CM | POA: Diagnosis not present

## 2016-05-21 LAB — GLUCOSE, CAPILLARY
GLUCOSE-CAPILLARY: 99 mg/dL (ref 65–99)
Glucose-Capillary: 118 mg/dL — ABNORMAL HIGH (ref 65–99)

## 2016-05-24 DIAGNOSIS — I5022 Chronic systolic (congestive) heart failure: Secondary | ICD-10-CM | POA: Diagnosis not present

## 2016-05-24 LAB — GLUCOSE, CAPILLARY
GLUCOSE-CAPILLARY: 137 mg/dL — AB (ref 65–99)
GLUCOSE-CAPILLARY: 105 mg/dL — AB (ref 65–99)
GLUCOSE-CAPILLARY: 145 mg/dL — AB (ref 65–99)
Glucose-Capillary: 99 mg/dL (ref 65–99)
Glucose-Capillary: 137 mg/dL — ABNORMAL HIGH (ref 65–99)
Glucose-Capillary: 94 mg/dL (ref 65–99)

## 2016-05-25 DIAGNOSIS — I5022 Chronic systolic (congestive) heart failure: Secondary | ICD-10-CM | POA: Diagnosis not present

## 2016-05-25 LAB — GLUCOSE, CAPILLARY
GLUCOSE-CAPILLARY: 158 mg/dL — AB (ref 65–99)
GLUCOSE-CAPILLARY: 102 mg/dL — AB (ref 65–99)

## 2016-05-26 LAB — GLUCOSE, CAPILLARY
GLUCOSE-CAPILLARY: 103 mg/dL — AB (ref 65–99)
Glucose-Capillary: 142 mg/dL — ABNORMAL HIGH (ref 65–99)
Glucose-Capillary: 139 mg/dL — ABNORMAL HIGH (ref 65–99)

## 2016-05-27 DIAGNOSIS — I5022 Chronic systolic (congestive) heart failure: Secondary | ICD-10-CM | POA: Diagnosis not present

## 2016-05-27 LAB — GLUCOSE, CAPILLARY
GLUCOSE-CAPILLARY: 108 mg/dL — AB (ref 65–99)
GLUCOSE-CAPILLARY: 174 mg/dL — AB (ref 65–99)

## 2016-05-28 DIAGNOSIS — I5022 Chronic systolic (congestive) heart failure: Secondary | ICD-10-CM | POA: Diagnosis not present

## 2016-05-29 DIAGNOSIS — I5022 Chronic systolic (congestive) heart failure: Secondary | ICD-10-CM | POA: Diagnosis not present

## 2016-05-29 LAB — GLUCOSE, CAPILLARY: GLUCOSE-CAPILLARY: 135 mg/dL — AB (ref 65–99)

## 2016-05-30 DIAGNOSIS — I5022 Chronic systolic (congestive) heart failure: Secondary | ICD-10-CM | POA: Diagnosis not present

## 2016-05-30 LAB — GLUCOSE, CAPILLARY
Glucose-Capillary: 104 mg/dL — ABNORMAL HIGH (ref 65–99)
Glucose-Capillary: 117 mg/dL — ABNORMAL HIGH (ref 65–99)

## 2016-05-31 DIAGNOSIS — I5022 Chronic systolic (congestive) heart failure: Secondary | ICD-10-CM | POA: Diagnosis not present

## 2016-05-31 LAB — GLUCOSE, CAPILLARY
Glucose-Capillary: 107 mg/dL — ABNORMAL HIGH (ref 65–99)
Glucose-Capillary: 190 mg/dL — ABNORMAL HIGH (ref 65–99)

## 2016-06-01 LAB — GLUCOSE, CAPILLARY
GLUCOSE-CAPILLARY: 92 mg/dL (ref 65–99)
GLUCOSE-CAPILLARY: 104 mg/dL — AB (ref 65–99)
GLUCOSE-CAPILLARY: 124 mg/dL — AB (ref 65–99)
Glucose-Capillary: 142 mg/dL — ABNORMAL HIGH (ref 65–99)

## 2016-06-02 DIAGNOSIS — I5022 Chronic systolic (congestive) heart failure: Secondary | ICD-10-CM | POA: Diagnosis not present

## 2016-06-02 LAB — GLUCOSE, CAPILLARY
GLUCOSE-CAPILLARY: 98 mg/dL (ref 65–99)
GLUCOSE-CAPILLARY: 145 mg/dL — AB (ref 65–99)

## 2016-06-03 DIAGNOSIS — I5022 Chronic systolic (congestive) heart failure: Secondary | ICD-10-CM | POA: Diagnosis not present

## 2016-06-03 LAB — GLUCOSE, CAPILLARY: GLUCOSE-CAPILLARY: 107 mg/dL — AB (ref 65–99)

## 2016-06-04 ENCOUNTER — Encounter
Admission: RE | Admit: 2016-06-04 | Discharge: 2016-06-04 | Disposition: A | Discharge status: Home / Self Care | Payer: Medicare Other | Source: Ambulatory Visit | Attending: Internal Medicine | Admitting: Internal Medicine

## 2016-06-04 DIAGNOSIS — R51 Headache: Secondary | ICD-10-CM | POA: Insufficient documentation

## 2016-06-04 DIAGNOSIS — R5381 Other malaise: Secondary | ICD-10-CM | POA: Insufficient documentation

## 2016-06-04 DIAGNOSIS — E871 Hypo-osmolality and hyponatremia: Secondary | ICD-10-CM | POA: Insufficient documentation

## 2016-06-04 DIAGNOSIS — E559 Vitamin D deficiency, unspecified: Secondary | ICD-10-CM | POA: Insufficient documentation

## 2016-06-08 DIAGNOSIS — E559 Vitamin D deficiency, unspecified: Secondary | ICD-10-CM | POA: Diagnosis present

## 2016-06-08 DIAGNOSIS — E871 Hypo-osmolality and hyponatremia: Secondary | ICD-10-CM | POA: Diagnosis not present

## 2016-06-08 DIAGNOSIS — R5381 Other malaise: Secondary | ICD-10-CM | POA: Diagnosis not present

## 2016-06-08 DIAGNOSIS — R51 Headache: Secondary | ICD-10-CM | POA: Diagnosis not present

## 2016-06-08 LAB — AMMONIA: Ammonia: 25 umol/L (ref 9–35)

## 2016-06-09 LAB — HEMOGLOBIN A1C
HEMOGLOBIN A1C: 5.6 % (ref 4.8–5.6)
Mean Plasma Glucose: 114 mg/dL

## 2016-06-09 LAB — VITAMIN D 25 HYDROXY (VIT D DEFICIENCY, FRACTURES): Vit D, 25-Hydroxy: 38 ng/mL (ref 30.0–100.0)

## 2016-06-09 LAB — VALPROIC ACID LEVEL, FREE: VALPROIC ACID FREE: 3.5 ug/mL — AB (ref 6.0–22.0)

## 2016-06-22 ENCOUNTER — Non-Acute Institutional Stay (SKILLED_NURSING_FACILITY): Payer: Medicare Other | Admitting: Gerontology

## 2016-06-22 ENCOUNTER — Other Ambulatory Visit
Admission: RE | Admit: 2016-06-22 | Discharge: 2016-06-22 | Disposition: A | Discharge status: Home / Self Care | Payer: Medicare Other | Source: Ambulatory Visit | Attending: Gerontology | Admitting: Gerontology

## 2016-06-22 DIAGNOSIS — R252 Cramp and spasm: Secondary | ICD-10-CM | POA: Insufficient documentation

## 2016-06-22 DIAGNOSIS — E871 Hypo-osmolality and hyponatremia: Secondary | ICD-10-CM

## 2016-06-22 DIAGNOSIS — R5381 Other malaise: Secondary | ICD-10-CM | POA: Diagnosis not present

## 2016-06-22 LAB — CBC WITH DIFFERENTIAL/PLATELET
Basophils Absolute: 0 10*3/uL (ref 0–0.1)
Basophils Relative: 1 %
EOS PCT: 3 %
Eosinophils Absolute: 0.2 10*3/uL (ref 0–0.7)
HCT: 31.2 % — ABNORMAL LOW (ref 35.0–47.0)
Hemoglobin: 10.3 g/dL — ABNORMAL LOW (ref 12.0–16.0)
LYMPHS ABS: 1.7 10*3/uL (ref 1.0–3.6)
LYMPHS PCT: 23 %
MCH: 27.6 pg (ref 26.0–34.0)
MCHC: 33.1 g/dL (ref 32.0–36.0)
MCV: 83.4 fL (ref 80.0–100.0)
MONO ABS: 0.6 10*3/uL (ref 0.2–0.9)
Monocytes Relative: 8 %
Neutro Abs: 4.9 10*3/uL (ref 1.4–6.5)
Neutrophils Relative %: 65 %
PLATELETS: 253 10*3/uL (ref 150–440)
RBC: 3.74 MIL/uL — AB (ref 3.80–5.20)
RDW: 14.4 % (ref 11.5–14.5)
WBC: 7.5 10*3/uL (ref 3.6–11.0)

## 2016-06-22 LAB — COMPREHENSIVE METABOLIC PANEL
ALT: 11 U/L — AB (ref 14–54)
AST: 16 U/L (ref 15–41)
Albumin: 3.3 g/dL — ABNORMAL LOW (ref 3.5–5.0)
Alkaline Phosphatase: 42 U/L (ref 38–126)
Anion gap: 8 (ref 5–15)
BUN: 28 mg/dL — AB (ref 6–20)
CHLORIDE: 87 mmol/L — AB (ref 101–111)
CO2: 31 mmol/L (ref 22–32)
CREATININE: 0.57 mg/dL (ref 0.44–1.00)
Calcium: 8.9 mg/dL (ref 8.9–10.3)
GFR calc Af Amer: >60 mL/min (ref >60)
GFR calc non Af Amer: >60 mL/min (ref >60)
GLUCOSE: 86 mg/dL (ref 65–99)
Potassium: 4.5 mmol/L (ref 3.5–5.1)
SODIUM: 126 mmol/L — AB (ref 135–145)
Total Bilirubin: 0.4 mg/dL (ref 0.3–1.2)
Total Protein: 5.9 g/dL — ABNORMAL LOW (ref 6.5–8.1)

## 2016-06-22 LAB — MAGNESIUM: MAGNESIUM: 1.8 mg/dL (ref 1.7–2.4)

## 2016-06-22 LAB — VITAMIN B12: Vitamin B-12: 959 pg/mL — ABNORMAL HIGH (ref 180–914)

## 2016-06-25 ENCOUNTER — Other Ambulatory Visit
Admission: RE | Admit: 2016-06-25 | Discharge: 2016-06-25 | Disposition: A | Discharge status: Home / Self Care | Payer: Medicare Other | Source: Skilled Nursing Facility | Attending: Gerontology | Admitting: Gerontology

## 2016-06-25 DIAGNOSIS — R5381 Other malaise: Secondary | ICD-10-CM | POA: Insufficient documentation

## 2016-06-25 DIAGNOSIS — R252 Cramp and spasm: Secondary | ICD-10-CM | POA: Insufficient documentation

## 2016-06-25 LAB — BASIC METABOLIC PANEL
ANION GAP: 6 (ref 5–15)
BUN: 19 mg/dL (ref 6–20)
CHLORIDE: 92 mmol/L — AB (ref 101–111)
CO2: 32 mmol/L (ref 22–32)
Calcium: 8.9 mg/dL (ref 8.9–10.3)
Creatinine, Ser: 0.7 mg/dL (ref 0.44–1.00)
GFR calc Af Amer: >60 mL/min (ref >60)
GFR calc non Af Amer: >60 mL/min (ref >60)
GLUCOSE: 92 mg/dL (ref 65–99)
POTASSIUM: 4.4 mmol/L (ref 3.5–5.1)
Sodium: 130 mmol/L — ABNORMAL LOW (ref 135–145)

## 2016-07-04 ENCOUNTER — Encounter
Admission: RE | Admit: 2016-07-04 | Discharge: 2016-07-04 | Disposition: A | Discharge status: Home / Self Care | Payer: Medicare Other | Source: Ambulatory Visit | Attending: Internal Medicine | Admitting: Internal Medicine

## 2016-07-08 ENCOUNTER — Non-Acute Institutional Stay (SKILLED_NURSING_FACILITY): Payer: Medicare Other | Admitting: Gerontology

## 2016-07-08 DIAGNOSIS — E222 Syndrome of inappropriate secretion of antidiuretic hormone: Secondary | ICD-10-CM

## 2016-07-08 DIAGNOSIS — Z9989 Dependence on other enabling machines and devices: Secondary | ICD-10-CM | POA: Diagnosis not present

## 2016-07-08 DIAGNOSIS — R001 Bradycardia, unspecified: Secondary | ICD-10-CM | POA: Diagnosis not present

## 2016-07-08 DIAGNOSIS — E559 Vitamin D deficiency, unspecified: Secondary | ICD-10-CM | POA: Diagnosis not present

## 2016-07-08 DIAGNOSIS — K219 Gastro-esophageal reflux disease without esophagitis: Secondary | ICD-10-CM | POA: Diagnosis not present

## 2016-07-08 DIAGNOSIS — E639 Nutritional deficiency, unspecified: Secondary | ICD-10-CM

## 2016-07-08 DIAGNOSIS — Z950 Presence of cardiac pacemaker: Secondary | ICD-10-CM | POA: Diagnosis not present

## 2016-07-08 DIAGNOSIS — I110 Hypertensive heart disease with heart failure: Secondary | ICD-10-CM

## 2016-07-08 DIAGNOSIS — K5900 Constipation, unspecified: Secondary | ICD-10-CM

## 2016-07-08 DIAGNOSIS — E782 Mixed hyperlipidemia: Secondary | ICD-10-CM

## 2016-07-08 DIAGNOSIS — J449 Chronic obstructive pulmonary disease, unspecified: Secondary | ICD-10-CM

## 2016-07-08 DIAGNOSIS — F329 Major depressive disorder, single episode, unspecified: Secondary | ICD-10-CM | POA: Diagnosis not present

## 2016-07-08 DIAGNOSIS — Z9981 Dependence on supplemental oxygen: Secondary | ICD-10-CM

## 2016-07-08 DIAGNOSIS — G4731 Primary central sleep apnea: Secondary | ICD-10-CM

## 2016-07-08 DIAGNOSIS — F419 Anxiety disorder, unspecified: Secondary | ICD-10-CM

## 2016-07-08 DIAGNOSIS — G4700 Insomnia, unspecified: Secondary | ICD-10-CM

## 2016-07-08 DIAGNOSIS — G43909 Migraine, unspecified, not intractable, without status migrainosus: Secondary | ICD-10-CM

## 2016-07-08 DIAGNOSIS — I441 Atrioventricular block, second degree: Secondary | ICD-10-CM

## 2016-07-08 DIAGNOSIS — Z853 Personal history of malignant neoplasm of breast: Secondary | ICD-10-CM

## 2016-07-08 DIAGNOSIS — E1140 Type 2 diabetes mellitus with diabetic neuropathy, unspecified: Secondary | ICD-10-CM

## 2016-07-09 ENCOUNTER — Encounter: Payer: Self-pay | Admitting: Gerontology

## 2016-07-09 NOTE — Progress Notes (Signed)
Location:   [facility] Room Number: [phone] Place of Service:  SNF [phone]) Provider:  Toni Arthurs, NP-C  Juluis Pitch, MD  Patient Care Team: Juluis Pitch, MD as PCP - General (Family Medicine) Alisa Graff, FNP as Nurse Practitioner (Family Medicine) Teodoro Spray, MD as Consulting Physician (Cardiology) Flora Lipps, MD as Consulting Physician (Pulmonary Disease)  Extended Emergency Contact Information Primary Emergency Contact: [name],[name] Address: [address]          [place], [place] [address] [name] of [place] Phone: [phone] Mobile Phone: [phone] Relation: Daughter Secondary Emergency Contact: Rahilly,[name] Address: [address]          [place], [place] [address] [name] of [place] Phone: [phone] Relation: Son  Code Status:  FULL Goals of care: Advanced Directive information Advanced Directives 07/08/2016  Does Patient Have a Medical Advance Directive? No  Type of Advance Directive -  Does patient want to make changes to medical advance directive? -  Copy of Catharine in Chart? -  Would patient like information on creating a medical advance directive? -     Chief Complaint  Patient presents with  . Medical Management of Chronic Issues    Routine Visit    HPI:  Pt is a 82 y.o. female seen today for medical management of chronic diseases. Pt has multiple medical co-morbidities. Conditions have been stable in the past month except for the hyponatreamia. Pt was c/o increased cramps in the legs. Na+ checked and was found to be low again. IVF given for 2 days with improvement in symptoms. Will recheck levels soon to assess for need for Demecocycline, etc. Pt is resident in skilled nursing. Pt has adapted well. She does not interact with other residents much, but is pleasant. Pt reports she is feeling well at this time. Pt does c/o intermittent, though frequent migraines. She says  the pain is in different locations. Nothing makes them better, but tries tylenol for the pain. Reports she has a headache almost everyday. She typically awakens in the morning with them. She was initiated on Melatonin and Depakote for Migraine prophylaxis. Pt does report an improvement of symptoms. VSS. No other complaints.    Past Medical History:  Diagnosis Date  . Anxiety    unspecified  . Arthritis   . Breast cancer ([place]) 2007   Early stage left breast cancer status post partial colectomy, 04/2005 (Dr. Oliva Bustard)  . Chest pain, unspecified   . CHF (congestive heart failure) ([place])   . Chronic headache   . Colon cancer ([place])    status post partial colectomy, diagnosed 2000  . Colon neoplasm   . COPD (chronic obstructive pulmonary disease) ([place])   . Depression    unspecified  . Diabetes mellitus   . Fibrocystic breast   . History of breast cancer   . History of colon cancer   . HTN (hypertension) with goal to be determined   . Hyperlipidemia   . Hyperlipidemia, unspecified   . Hypertension   . Obesity, unspecified   . Sleep apnea    mild to moderate   Past Surgical History:  Procedure Laterality Date  . APPENDECTOMY    . BREAST LUMPECTOMY  2007   left  . BUNIONECTOMY Right   . CATARACT EXTRACTION W/ INTRAOCULAR LENS IMPLANT & ANTERIOR VITRECTOMY, BILATERAL Bilateral   . COLON SURGERY  1993  . COLOSTOMY    . DILATION AND CURETTAGE, DIAGNOSTIC / THERAPEUTIC  Fibroid removal  . INSERT REPLACE REMOVE PACEMAKER     ppm medtronic [id number] Adapta dula chamber rate responsive. 02/21/14  . MASTECTOMY PARTIAL / LUMPECTOMY    . PARTIAL COLECTOMY     Abdominal & Transanal   . TONSILLECTOMY    . TOTAL KNEE ARTHROPLASTY Right 07/03/2013   Right total knee arthroplasty using computer assisted navigation  . TUBAL LIGATION Bilateral 1971   with questionable appendectomy    No Known Allergies  Allergies as of 07/08/2016   No Known Allergies     Medication List       Accurate  as of 07/08/16 11:59 PM. Always use your most recent med list.          acetaminophen 325 MG tablet Commonly known as:  TYLENOL Take 650 mg by mouth every 4 (four) hours as needed for fever. Do not exceed 3000 mg in 24hrs   acetaminophen 500 MG tablet Commonly known as:  TYLENOL Take 1,000 mg by mouth daily. 5 pm   albuterol (2.5 MG/3ML) 0.083% nebulizer solution Commonly known as:  PROVENTIL Take 2.5 mg by nebulization every 6 (six) hours. For wheezing ,sob  per [facility], visit note 04/05/16   alum & mag hydroxide-simeth [id number] MG/5ML suspension Commonly known as:  MAALOX PLUS Take 30 mLs by mouth as needed for indigestion.   aspirin EC 81 MG tablet Take 81 mg by mouth daily.   COMBIVENT RESPIMAT 20-100 MCG/ACT Aers respimat Generic drug:  Ipratropium-Albuterol Inhale 1 puff into the lungs every 6 (six) hours.   divalproex 125 MG capsule Commonly known as:  DEPAKOTE SPRINKLE Take 125 mg by mouth 2 (two) times daily.   docusate sodium 100 MG capsule Commonly known as:  COLACE Take 100 mg by mouth daily.   DULoxetine 60 MG capsule Commonly known as:  CYMBALTA Take 60 mg by mouth daily.   eucerin cream Apply liberal amount topically to dry areas of skin daily after bath and as needed. Ok to keep at bedside   feeding supplement (PRO-STAT SUGAR FREE 64) Liqd Take 30 mLs by mouth daily.   fluticasone-salmeterol 115-21 MCG/ACT inhaler Commonly known as:  ADVAIR HFA Inhale 2 puffs into the lungs 2 (two) times daily. 8 am and 8 pm   furosemide 20 MG tablet Commonly known as:  LASIX Take 20 mg by mouth daily.   gabapentin 400 MG capsule Commonly known as:  NEURONTIN Take 400 mg by mouth 3 (three) times daily.   glipiZIDE-metformin 2.5-500 MG tablet Commonly known as:  METAGLIP Take 1 tablet by mouth daily.   ketotifen 0.025 % ophthalmic solution Commonly known as:  ZADITOR Place 1 drop into both eyes 2 (two) times daily.   LORazepam 1 MG  tablet Commonly known as:  ATIVAN Take one tablet by mouth every night at bedtime for anxiety   magnesium oxide 400 MG tablet Commonly known as:  MAG-OX Take 400 mg by mouth 2 (two) times daily.   Melatonin 5 MG Tabs Take 1 tablet by mouth at bedtime.   meloxicam 7.5 MG tablet Commonly known as:  MOBIC Take 7.5 mg by mouth daily.   Menthol 1 MG Lozg Use as directed 1 lozenge in the mouth or throat every 6 (six) hours as needed.   metoprolol tartrate 25 MG tablet Commonly known as:  LOPRESSOR Take 25 mg by mouth 2 (two) times daily.   nystatin powder Commonly known as:  MYCOSTATIN/NYSTOP Apply 1 application topically to abdominal folds daily for redness and  irritation.   OXYGEN Inhale 2 L into the lungs continuous. At all times for COPD   pantoprazole 20 MG tablet Commonly known as:  PROTONIX Take 20 mg by mouth daily.   potassium chloride 10 MEQ tablet Commonly known as:  K-DUR,KLOR-CON Take 10 mEq by mouth daily.   SENIOR TABS Tabs Take 1 tablet by mouth daily.   senna-docusate 8.6-50 MG tablet Commonly known as:  Senokot-S Take 1 tablet by mouth 2 (two) times daily.   sucralfate 1 g tablet Commonly known as:  CARAFATE Take 1 g by mouth 2 (two) times daily. Tapering dose   trandolapril 2 MG tablet Commonly known as:  MAVIK Take 2 mg by mouth daily.   traZODone 100 MG tablet Commonly known as:  DESYREL Take 1 tablet (100 mg total) by mouth at bedtime.   Vitamin D3 2000 units Tabs Take 1 tablet by mouth daily.       Review of Systems  Constitutional: Negative for activity change, appetite change, chills, diaphoresis and fever.  HENT: Negative for congestion, sneezing, sore throat, trouble swallowing and voice change.   Eyes: Negative for pain, redness and visual disturbance.  Respiratory: Negative for apnea, cough, choking, chest tightness, shortness of breath and wheezing.   Cardiovascular: Negative for chest pain, palpitations and leg swelling.   Gastrointestinal: Negative for abdominal distention, abdominal pain, constipation, diarrhea and nausea.  Genitourinary: Negative for difficulty urinating, dysuria, frequency and urgency.  Musculoskeletal: Negative for back pain, gait problem and myalgias. Arthralgias: typical arthritis.  Skin: Negative for color change, pallor, rash and wound.  Neurological: Negative for dizziness, tremors, syncope, speech difficulty, weakness, numbness and headaches.  Psychiatric/Behavioral: Negative for agitation and behavioral problems.  All other systems reviewed and are negative.   Immunization History  Administered Date(s) Administered  . Influenza Split 11/08/2013, 11/21/2014  . Influenza, High Dose Seasonal PF 12/05/2012  . Influenza-Unspecified 08/05/2015  . PPD Test 03/05/2014, 10/09/2015, 02/10/2016  . Pneumococcal Polysaccharide-23 05/16/2010  . Pneumococcal-Unspecified 03/05/2014   Pertinent  Health Maintenance Due  Topic Date Due  . FOOT EXAM  03/23/1944  . OPHTHALMOLOGY EXAM  03/23/1944  . DEXA SCAN  03/22/2001  . PNA vac Low Risk Adult (2 of 2 - PCV13) 05/16/2011  . INFLUENZA VACCINE  08/04/2016  . HEMOGLOBIN A1C  12/08/2016   Fall Risk  04/05/2016 12/15/2015 10/07/2015 09/09/2015  Falls in the past year? No No Yes Yes  Number falls in past yr: - - 2 or more 2 or more  Injury with Fall? - - No No  Risk Factor Category  - - - High Fall Risk  Risk for fall due to : - - - History of fall(s)  Follow up - - Education provided;Falls evaluation completed Education provided   Functional Status Survey:    Vitals:   07/08/16 0943  BP: (!) 125/52  Pulse: 76  Resp: 20  Temp: 98.3 F (36.8 C)  SpO2: 96%  Weight: 221 lb 8 oz (100.5 kg)  Height: 4' 11" (1.499 m)   Body mass index is 44.74 kg/m. Physical Exam  Constitutional: She is oriented to person, place, and time. Vital signs are normal. She appears well-developed and well-nourished. She is active and cooperative. Nasal cannula  in place.  Morbid obese in no acute distress   HENT:  Head: Normocephalic.  Mouth/Throat: Oropharynx is clear and moist. No oropharyngeal exudate.  Eyes: Pupils are equal, round, and reactive to light. Conjunctivae and EOM are normal. Right eye exhibits no discharge. Left  eye exhibits no discharge. No scleral icterus.  Neck: Normal range of motion. No JVD present. No thyromegaly present.  Cardiovascular: Normal rate, regular rhythm and intact distal pulses.  Exam reveals no gallop, no distant heart sounds and no friction rub.   No murmur heard. Pulses:      Dorsalis pedis pulses are 2+ on the right side, and 2+ on the left side.  Pulmonary/Chest: Effort normal. No respiratory distress. She has no decreased breath sounds. She has no wheezes. She has no rhonchi. She has no rales.  Oxygen 2 liters [place] in place.   Abdominal: Soft. Bowel sounds are normal. She exhibits no distension. There is no tenderness. There is no rebound and no guarding.  Musculoskeletal: Normal range of motion. She exhibits no edema, tenderness or deformity.  Moves x 4 extremities.Unsteady gait   Lymphadenopathy:    She has no cervical adenopathy.  Neurological: She is alert and oriented to person, place, and time.  Skin: Skin is warm and dry. No rash noted. No erythema. No pallor.  Psychiatric: She has a normal mood and affect.    Labs reviewed:  Recent Labs  02/12/16 1020  03/25/16 0520 06/22/16 0545 06/25/16 1755  NA 119*  < > 133* 126* 130*  K 5.2*  < > 4.6 4.5 4.4  CL 81*  < > 90* 87* 92*  CO2 30  < > 34* 31 32  GLUCOSE 123*  < > 89 86 92  BUN 15  < > 13 28* 19  CREATININE 0.78  < > 0.44 0.57 0.70  CALCIUM 8.3*  < > 8.7* 8.9 8.9  MG 1.5*  --   --  1.8  --   < > = values in this interval not displayed.  Recent Labs  03/05/16 0725 03/25/16 0520 06/22/16 0545  AST _0 ALT 16 11* 11*  ALKPHOS 58 54 42  BILITOT 0.7 0.6 0.4  PROT 4.8* 5.4* 5.9*  ALBUMIN 2.3* 2.8* 3.3*    Recent Labs   03/05/16 0725 03/25/16 0520 06/22/16 1045  WBC 6.7 7.2 7.5  NEUTROABS 4.2 4.5 4.9  HGB 9.0* 9.6* 10.3*  HCT 26.8* 28.9* 31.2*  MCV 85.5 87.7 83.4  PLT 336 253 253   Lab Results  Component Value Date   TSH 2.998 02/12/2016   Lab Results  Component Value Date   HGBA1C 5.6 06/08/2016   Lab Results  Component Value Date   CHOL 106 02/12/2016   HDL 42 02/12/2016   LDLCALC 49 02/12/2016   TRIG 74 02/12/2016   CHOLHDL 2.5 02/12/2016    Significant Diagnostic Results in last 30 days:  No results found.  Assessment/Plan 1. Dependence on supplemental oxygen  Continue O2 2L [place]  2. Primary central sleep apnea  CPAP Q HS  3. Dependence on other enabling machines and devices  Cleanse CPAP mask, tubing, chamber water and small amount of dish soap once a week. Dry completely  4. Presence of cardiac pacemaker  Stable   5. Bradycardia, sinus  Stable   6. Second degree AV block  Stable   7. Single current episode of major depressive disorder, unspecified depression episode severity  Continue Duloxetine 60 mg po Q Day  8. Anxiety disorder, unspecified type  Continue Lorazepam 1 mg po Q HS  9. Vitamin D deficiency  Stable  Continue Cholecalciferol 2,000 units po Q Day  10. Insomnia, unspecified type  Stable  Continue Trazodone 100 mg po Q HS  11. Gastroesophageal reflux  disease without esophagitis  Stable  Decrease Protonix to 20 mg po Q Day  Decrease Sucralfate to 1 gram BID- tapering dose off  12. Syndrome of inappropriate antidiuretic hormone (HCC)  Continue No sodium restriction diet  Stable   13. Migraine without status migrainosus, not intractable, unspecified migraine type  Stable  Melatonin 10 mg po Q HS migraine prophylaxis  Continue Depakote Sprinkles 125 mg po BID for headache prophylaxis  14. Personal history of other malignant neoplasm of large intestine (CODE)  stable  15. Personal history of malignant neoplasm of  breast  Stable   16. Mixed hyperlipidemia  Stable  No medications necessary  Lipitor DC'ed  17. Type 2 diabetes, controlled, with neuropathy (HCC)  Stable  Continue glipizide-metformin tablet; 2.5-500 mg; amt: 1 tablet po Q Day  FSBS BID  Continue Gabapentin 400 mg po TID  Continue Mobix 7.5 mg po Q Day  18. Chronic obstructive pulmonary disease, unspecified COPD type (HCC)  Stable   Continue albuterol sulfate solution for nebulization; 2.5 mg /3 mL (0.083 %) Q 6 hours per instructions from [facility]  Continue Advair Diskus (fluticasone-salmeterol) blister with device; 250-50 mcg/dose 1 puff Q 12 hours  Continue Combivent Respimat 20-100- 1 inhalation QID  19. Hypertensive heart disease with CHF (congestive heart failure) (HCC)  Stable  Continue Lasix 40 mg po Q Day  Discontinue Metoprolol 25 mg po BID- bradycardia  Continue trandolapril 2 mg PO Q Day  Continue Aspir-81 (aspirin) [OTC] tablet,delayed release (DR/EC); 81 mg po Q Day  Continue potassium chloride tablet extended release; 10 mEq; amt: 1 tablet po Q Day  20. Nutritional Deficiency  Continue Pro-Stat 30 mL PO Q Day  Continue Multivitamin po Q Day  Continue Magnesium Oxide 400 mg po BID  21. Constipation  Continue Colace 100 mg po Q Day  Continue Senna-S po BID  Milk of Mag. 30 ml po Q day prn   Family/ staff Communication:   Total Time:  Documentation:  Face to Face:  Family/Phone:   Labs/tests ordered:  Met B   Medication list reviewed and assessed for continued appropriateness. Monthly medication orders reviewed and signed.  Vikki Ports, NP-C Geriatrics [facility] [phone], [place] [address] Cell Phone (Mon-Fri 8am-5pm):  [phone] On Call:  [phone] & follow prompts after 5pm & weekends Office Phone:  [phone] Office Fax:  [phone]

## 2016-07-11 NOTE — Progress Notes (Signed)
Location:      Place of Service:  SNF (31) Provider:  Toni Arthurs, NP-C  Juluis Pitch, MD  Patient Care Team: Juluis Pitch, MD as PCP - General (Family Medicine) Alisa Graff, FNP as Nurse Practitioner (Family Medicine) Teodoro Spray, MD as Consulting Physician (Cardiology) Flora Lipps, MD as Consulting Physician (Pulmonary Disease)  Extended Emergency Contact Information Primary Emergency Contact: [name],[name] Address: [place], [place] [address] [name] of [place] Phone: [phone] Mobile Phone: [phone] Relation: Daughter Secondary Emergency Contact: Inglett,[name] Address: [address]          [place], [place] [address] [name] of [place] Phone: [phone] Relation: Son  Code Status:  DNR Goals of care: Advanced Directive information Advanced Directives 07/08/2016  Does Patient Have a Medical Advance Directive? No  Type of Advance Directive -  Does patient want to make changes to medical advance directive? -  Copy of [place] in Chart? -  Would patient like information on creating a medical advance directive? -     Chief Complaint  Patient presents with  . Acute Visit    HPI:  Pt is a 82 y.o. female seen today for an acute visit for c/o leg cramps and spasms. Pt has a h/o of chronic hyponatremia that had resolved a few months ago. Pt denies recent GI distress, n/v/d/f/c/cp/sob/ha/abd pain/dizziness/cough, etc. Will re-check labs to assess for recurrent hyponatremia. VSS. No other complaints.    Past Medical History:  Diagnosis Date  . Anxiety    unspecified  . Arthritis   . Breast cancer ([place]) 2007   Early stage left breast cancer status post partial colectomy, 04/2005 (Dr. Oliva Bustard)  . Chest pain, unspecified   . CHF (congestive heart failure) ([place])   . Chronic headache   . Colon cancer ([place])    status post partial colectomy, diagnosed 2000  . Colon neoplasm   . COPD  (chronic obstructive pulmonary disease) ([place])   . Depression    unspecified  . Diabetes mellitus   . Fibrocystic breast   . History of breast cancer   . History of colon cancer   . HTN (hypertension) with goal to be determined   . Hyperlipidemia   . Hyperlipidemia, unspecified   . Hypertension   . Obesity, unspecified   . Sleep apnea    mild to moderate   Past Surgical History:  Procedure Laterality Date  . APPENDECTOMY    . BREAST LUMPECTOMY  2007   left  . BUNIONECTOMY Right   . CATARACT EXTRACTION W/ INTRAOCULAR LENS IMPLANT & ANTERIOR VITRECTOMY, BILATERAL Bilateral   . COLON SURGERY  1993  . COLOSTOMY    . DILATION AND CURETTAGE, DIAGNOSTIC / THERAPEUTIC     Fibroid removal  . INSERT REPLACE REMOVE PACEMAKER     ppm medtronic [id number] Adapta dula chamber rate responsive. 02/21/14  . MASTECTOMY PARTIAL / LUMPECTOMY    . PARTIAL COLECTOMY     Abdominal & Transanal   . TONSILLECTOMY    . TOTAL KNEE ARTHROPLASTY Right 07/03/2013   Right total knee arthroplasty using computer assisted navigation  . TUBAL LIGATION Bilateral 1971   with questionable appendectomy    No Known Allergies  Allergies as of 06/22/2016   No Known Allergies     Medication List       Accurate as of 06/22/16 11:59 PM. Always use your most recent med list.  albuterol (2.5 MG/3ML) 0.083% nebulizer solution Commonly known as:  PROVENTIL Take 2.5 mg by nebulization every 6 (six) hours. For wheezing ,sob  per [facility], visit note 04/05/16   alum & mag hydroxide-simeth 161-096-04 MG/5ML suspension Commonly known as:  MAALOX PLUS Take 30 mLs by mouth as needed for indigestion.   COMBIVENT RESPIMAT 20-100 MCG/ACT Aers respimat Generic drug:  Ipratropium-Albuterol Inhale 1 puff into the lungs every 6 (six) hours.   divalproex 125 MG capsule Commonly known as:  DEPAKOTE SPRINKLE Take 125 mg by mouth 2 (two) times daily.   docusate sodium 100 MG capsule Commonly known as:   COLACE Take 100 mg by mouth daily.   DULoxetine 60 MG capsule Commonly known as:  CYMBALTA Take 60 mg by mouth daily.   eucerin cream Apply liberal amount topically to dry areas of skin daily after bath and as needed. Ok to keep at bedside   feeding supplement (PRO-STAT SUGAR FREE 64) Liqd Take 30 mLs by mouth daily.   gabapentin 400 MG capsule Commonly known as:  NEURONTIN Take 400 mg by mouth 3 (three) times daily.   glipiZIDE-metformin 2.5-500 MG tablet Commonly known as:  METAGLIP Take 1 tablet by mouth daily.   ketotifen 0.025 % ophthalmic solution Commonly known as:  ZADITOR Place 1 drop into both eyes 2 (two) times daily.   LORazepam 1 MG tablet Commonly known as:  ATIVAN Take one tablet by mouth every night at bedtime for anxiety   magnesium oxide 400 MG tablet Commonly known as:  MAG-OX Take 400 mg by mouth 2 (two) times daily.   meloxicam 7.5 MG tablet Commonly known as:  MOBIC Take 7.5 mg by mouth daily.   metoprolol tartrate 25 MG tablet Commonly known as:  LOPRESSOR Take 25 mg by mouth 2 (two) times daily.   nystatin powder Commonly known as:  MYCOSTATIN/NYSTOP Apply 1 application topically to abdominal folds daily for redness and irritation.   OXYGEN Inhale 2 L into the lungs continuous. At all times for COPD   potassium chloride 10 MEQ tablet Commonly known as:  K-DUR,KLOR-CON Take 10 mEq by mouth daily.   senna-docusate 8.6-50 MG tablet Commonly known as:  Senokot-S Take 1 tablet by mouth 2 (two) times daily.   sucralfate 1 g tablet Commonly known as:  CARAFATE Take 1 g by mouth 2 (two) times daily. Tapering dose   traZODone 100 MG tablet Commonly known as:  DESYREL Take 1 tablet (100 mg total) by mouth at bedtime.   Vitamin D3 2000 units Tabs Take 1 tablet by mouth daily.       Review of Systems  Constitutional: Negative for activity change, appetite change, chills, diaphoresis and fever.  HENT: Negative for congestion,  sneezing, sore throat, trouble swallowing and voice change.   Eyes: Negative for pain, redness and visual disturbance.  Respiratory: Negative for apnea, cough, choking, chest tightness, shortness of breath and wheezing.   Cardiovascular: Negative for chest pain, palpitations and leg swelling.  Gastrointestinal: Negative for abdominal distention, abdominal pain, constipation, diarrhea and nausea.  Genitourinary: Negative for difficulty urinating, dysuria, frequency and urgency.  Musculoskeletal: Negative for back pain, gait problem and myalgias. Arthralgias: typical arthritis.  Skin: Negative for color change, pallor, rash and wound.  Neurological: Negative for dizziness, tremors, syncope, speech difficulty, weakness, numbness and headaches.  Psychiatric/Behavioral: Negative for agitation and behavioral problems.  All other systems reviewed and are negative.   Immunization History  Administered Date(s) Administered  . Influenza Split 11/08/2013,  11/21/2014  . Influenza, High Dose Seasonal PF 12/05/2012  . Influenza-Unspecified 08/05/2015  . PPD Test 03/05/2014, 10/09/2015, 02/10/2016  . Pneumococcal Polysaccharide-23 05/16/2010  . Pneumococcal-Unspecified 03/05/2014   Pertinent  Health Maintenance Due  Topic Date Due  . FOOT EXAM  03/23/1944  . OPHTHALMOLOGY EXAM  03/23/1944  . DEXA SCAN  03/22/2001  . PNA vac Low Risk Adult (2 of 2 - PCV13) 03/05/2015  . INFLUENZA VACCINE  08/04/2016  . HEMOGLOBIN A1C  12/08/2016   Fall Risk  04/05/2016 12/15/2015 10/07/2015 09/09/2015  Falls in the past year? No No Yes Yes  Number falls in past yr: - - 2 or more 2 or more  Injury with Fall? - - No No  Risk Factor Category  - - - High Fall Risk  Risk for fall due to : - - - History of fall(s)  Follow up - - Education provided;Falls evaluation completed Education provided   Functional Status Survey:    Vitals:   06/20/16 1930  BP: (!) 151/83  Pulse: 68  Resp: 16  Temp: 98.4 F (36.9 C)    SpO2: 96%  Weight: 216 lb 9.6 oz (98.2 kg)   Body mass index is 43.75 kg/m. Physical Exam  Constitutional: She is oriented to person, place, and time. Vital signs are normal. She appears well-developed and well-nourished. She is active and cooperative. Nasal cannula in place.  Morbid obese in no acute distress   HENT:  Head: Normocephalic.  Mouth/Throat: Oropharynx is clear and moist. No oropharyngeal exudate.  Eyes: Conjunctivae and EOM are normal. Pupils are equal, round, and reactive to light. Right eye exhibits no discharge. Left eye exhibits no discharge. No scleral icterus.  Neck: Normal range of motion. No JVD present. No thyromegaly present.  Cardiovascular: Normal rate, regular rhythm and intact distal pulses.  Exam reveals no gallop, no distant heart sounds and no friction rub.   No murmur heard. Pulses:      Dorsalis pedis pulses are 2+ on the right side, and 2+ on the left side.  Pulmonary/Chest: Effort normal. No respiratory distress. She has no decreased breath sounds. She has no wheezes. She has no rhonchi. She has no rales.  Oxygen 2 liters  in place.   Abdominal: Soft. Bowel sounds are normal. She exhibits no distension. There is no tenderness. There is no rebound and no guarding.  Musculoskeletal: Normal range of motion. She exhibits no edema, tenderness or deformity.  Moves x 4 extremities.Unsteady gait   Lymphadenopathy:    She has no cervical adenopathy.  Neurological: She is alert and oriented to person, place, and time.  Skin: Skin is warm and dry. No rash noted. No erythema. No pallor.  Psychiatric: She has a normal mood and affect.    Labs reviewed:  Recent Labs  02/12/16 1020  03/25/16 0520 06/22/16 0545 06/25/16 1755  NA 119*  < > 133* 126* 130*  K 5.2*  < > 4.6 4.5 4.4  CL 81*  < > 90* 87* 92*  CO2 30  < > 34* 31 32  GLUCOSE 123*  < > 89 86 92  BUN 15  < > 13 28* 19  CREATININE 0.78  < > 0.44 0.57 0.70  CALCIUM 8.3*  < > 8.7* 8.9 8.9  MG  1.5*  --   --  1.8  --   < > = values in this interval not displayed.  Recent Labs  03/05/16 0725 03/25/16 0520 06/22/16 0545  AST 20 15 16  ALT 16 11* 11*  ALKPHOS 58 54 42  BILITOT 0.7 0.6 0.4  PROT 4.8* 5.4* 5.9*  ALBUMIN 2.3* 2.8* 3.3*    Recent Labs  03/05/16 0725 03/25/16 0520 06/22/16 1045  WBC 6.7 7.2 7.5  NEUTROABS 4.2 4.5 4.9  HGB 9.0* 9.6* 10.3*  HCT 26.8* 28.9* 31.2*  MCV 85.5 87.7 83.4  PLT 336 253 253   Lab Results  Component Value Date   TSH 2.998 02/12/2016   Lab Results  Component Value Date   HGBA1C 5.6 06/08/2016   Lab Results  Component Value Date   CHOL 106 02/12/2016   HDL 42 02/12/2016   LDLCALC 49 02/12/2016   TRIG 74 02/12/2016   CHOLHDL 2.5 02/12/2016    Significant Diagnostic Results in last 30 days:  No results found.  Assessment/Plan 1. Chronic hyponatremia  0.9% NS- Give 250 mL bolus over 1 hour, then continuous at 75 mL/ hr  Re-check Met C in 2 days  Decrease Lasix to 20 mg po Q Day  Decrease Protonix to 20 mg Q Day  Gatorade on lunch and supper trays  No sodium restriction  1 liter/ day free water restriction  Family/ staff Communication:   Total Time:  Documentation:  Face to Face:  Family/Phone:   Labs/tests ordered:  CBC, Met c  Medication list reviewed and assessed for continued appropriateness.  Vikki Ports, NP-C Geriatrics [facility] [phone] [address], [place] [address] Cell Phone (Mon-Fri 8am-5pm):  [phone] On Call:  [phone] & follow prompts after 5pm & weekends Office Phone:  [phone] Office Fax:  [phone]

## 2016-07-27 ENCOUNTER — Other Ambulatory Visit
Admission: RE | Admit: 2016-07-27 | Discharge: 2016-07-27 | Disposition: A | Discharge status: Home / Self Care | Payer: Medicare Other | Source: Ambulatory Visit | Attending: Gerontology | Admitting: Gerontology

## 2016-07-27 DIAGNOSIS — E222 Syndrome of inappropriate secretion of antidiuretic hormone: Secondary | ICD-10-CM | POA: Diagnosis present

## 2016-07-27 LAB — BASIC METABOLIC PANEL
ANION GAP: 8 (ref 5–15)
BUN: 21 mg/dL — AB (ref 6–20)
CHLORIDE: 84 mmol/L — AB (ref 101–111)
CO2: 34 mmol/L — ABNORMAL HIGH (ref 22–32)
Calcium: 9.1 mg/dL (ref 8.9–10.3)
Creatinine, Ser: 0.76 mg/dL (ref 0.44–1.00)
GFR calc Af Amer: >60 mL/min (ref >60)
GFR calc non Af Amer: >60 mL/min (ref >60)
Glucose, Bld: 100 mg/dL — ABNORMAL HIGH (ref 65–99)
POTASSIUM: 5.1 mmol/L (ref 3.5–5.1)
SODIUM: 126 mmol/L — AB (ref 135–145)

## 2016-08-04 ENCOUNTER — Encounter
Admission: RE | Admit: 2016-08-04 | Discharge: 2016-08-04 | Disposition: A | Discharge status: Home / Self Care | Payer: Medicare Other | Source: Ambulatory Visit | Attending: Internal Medicine | Admitting: Internal Medicine

## 2016-08-05 ENCOUNTER — Other Ambulatory Visit
Admission: RE | Admit: 2016-08-05 | Discharge: 2016-08-05 | Disposition: A | Discharge status: Home / Self Care | Payer: Medicare Other | Source: Ambulatory Visit | Attending: Gerontology | Admitting: Gerontology

## 2016-08-05 DIAGNOSIS — E222 Syndrome of inappropriate secretion of antidiuretic hormone: Secondary | ICD-10-CM | POA: Diagnosis present

## 2016-08-05 LAB — BASIC METABOLIC PANEL
Anion gap: 8 (ref 5–15)
BUN: 29 mg/dL — AB (ref 6–20)
CALCIUM: 9 mg/dL (ref 8.9–10.3)
CHLORIDE: 88 mmol/L — AB (ref 101–111)
CO2: 32 mmol/L (ref 22–32)
CREATININE: 0.69 mg/dL (ref 0.44–1.00)
GFR calc Af Amer: >60 mL/min (ref >60)
Glucose, Bld: 95 mg/dL (ref 65–99)
Potassium: 5 mmol/L (ref 3.5–5.1)
SODIUM: 128 mmol/L — AB (ref 135–145)

## 2016-08-10 ENCOUNTER — Encounter: Payer: Self-pay | Admitting: Gerontology

## 2016-08-10 ENCOUNTER — Non-Acute Institutional Stay (SKILLED_NURSING_FACILITY): Payer: Medicare Other | Admitting: Gerontology

## 2016-08-10 DIAGNOSIS — R001 Bradycardia, unspecified: Secondary | ICD-10-CM | POA: Diagnosis not present

## 2016-08-10 DIAGNOSIS — E559 Vitamin D deficiency, unspecified: Secondary | ICD-10-CM

## 2016-08-10 DIAGNOSIS — J449 Chronic obstructive pulmonary disease, unspecified: Secondary | ICD-10-CM

## 2016-08-10 DIAGNOSIS — E222 Syndrome of inappropriate secretion of antidiuretic hormone: Secondary | ICD-10-CM

## 2016-08-10 DIAGNOSIS — Z950 Presence of cardiac pacemaker: Secondary | ICD-10-CM | POA: Diagnosis not present

## 2016-08-10 DIAGNOSIS — K219 Gastro-esophageal reflux disease without esophagitis: Secondary | ICD-10-CM

## 2016-08-10 DIAGNOSIS — I441 Atrioventricular block, second degree: Secondary | ICD-10-CM | POA: Diagnosis not present

## 2016-08-10 DIAGNOSIS — Z9989 Dependence on other enabling machines and devices: Secondary | ICD-10-CM

## 2016-08-10 DIAGNOSIS — Z9981 Dependence on supplemental oxygen: Secondary | ICD-10-CM

## 2016-08-10 DIAGNOSIS — G4700 Insomnia, unspecified: Secondary | ICD-10-CM | POA: Diagnosis not present

## 2016-08-10 DIAGNOSIS — E782 Mixed hyperlipidemia: Secondary | ICD-10-CM

## 2016-08-10 DIAGNOSIS — G4731 Primary central sleep apnea: Secondary | ICD-10-CM | POA: Diagnosis not present

## 2016-08-10 DIAGNOSIS — G43909 Migraine, unspecified, not intractable, without status migrainosus: Secondary | ICD-10-CM

## 2016-08-10 DIAGNOSIS — E639 Nutritional deficiency, unspecified: Secondary | ICD-10-CM

## 2016-08-10 DIAGNOSIS — Z853 Personal history of malignant neoplasm of breast: Secondary | ICD-10-CM

## 2016-08-10 DIAGNOSIS — E1140 Type 2 diabetes mellitus with diabetic neuropathy, unspecified: Secondary | ICD-10-CM

## 2016-08-10 DIAGNOSIS — F329 Major depressive disorder, single episode, unspecified: Secondary | ICD-10-CM | POA: Diagnosis not present

## 2016-08-10 DIAGNOSIS — K5900 Constipation, unspecified: Secondary | ICD-10-CM

## 2016-08-10 DIAGNOSIS — F419 Anxiety disorder, unspecified: Secondary | ICD-10-CM

## 2016-08-10 DIAGNOSIS — I110 Hypertensive heart disease with heart failure: Secondary | ICD-10-CM

## 2016-08-10 NOTE — Progress Notes (Signed)
Location:   [facility] Room Number: [phone] Place of Service:  SNF [phone]) Provider:  Toni Arthurs, NP-C  Juluis Pitch, MD  Patient Care Team: Juluis Pitch, MD as PCP - General (Family Medicine) Alisa Graff, FNP as Nurse Practitioner (Family Medicine) Teodoro Spray, MD as Consulting Physician (Cardiology) Flora Lipps, MD as Consulting Physician (Pulmonary Disease)  Extended Emergency Contact Information Primary Emergency Contact: [name],[name] Address: [place], [place] [address] [name] of [place] Phone: [phone] Mobile Phone: [phone] Relation: Daughter Secondary Emergency Contact: Pacella,[name] Address: [address]          [place], [place] [address] [name] of [place] Phone: [phone] Relation: Son  Code Status:  FULL Goals of care: Advanced Directive information Advanced Directives 08/10/2016  Does Patient Have a Medical Advance Directive? No  Type of Advance Directive -  Does patient want to make changes to medical advance directive? -  Copy of [place] in Chart? -  Would patient like information on creating a medical advance directive? -     Chief Complaint  Patient presents with  . Medical Management of Chronic Issues    Routine Visit    HPI:  Pt is a 82 y.o. female seen today for medical management of chronic diseases. Pt has multiple medical co-morbidities. Conditions have been stable in the past month except for the hyponatreamia. Demeclocycline has been initiated. Pt is resident in skilled nursing. Pt has adapted well. She does not interact with other residents much, but is pleasant. Pt reports she is feeling well at this time. Pt does c/o intermittent, though frequent migraines. She says the pain is in different locations. Nothing makes them better, but tries tylenol for the pain. Reports she has a headache almost everyday. She typically awakens in the  morning with them. She was initiated on Melatonin and Depakote for Migraine prophylaxis. Pt does report an improvement of symptoms. VSS. No other complaints.    Past Medical History:  Diagnosis Date  . Anxiety    unspecified  . Arthritis   . Breast cancer ([facility]) 2007   Early stage left breast cancer status post partial colectomy, 04/2005 (Dr. Oliva Bustard)  . Chest pain, unspecified   . CHF (congestive heart failure) ([place])   . Chronic headache   . Colon cancer ([facility])    status post partial colectomy, diagnosed 2000  . Colon neoplasm   . COPD (chronic obstructive pulmonary disease) ([place])   . Depression    unspecified  . Diabetes mellitus   . Fibrocystic breast   . History of breast cancer   . History of colon cancer   . HTN (hypertension) with goal to be determined   . Hyperlipidemia   . Hyperlipidemia, unspecified   . Hypertension   . Obesity, unspecified   . Sleep apnea    mild to moderate   Past Surgical History:  Procedure Laterality Date  . APPENDECTOMY    . BREAST LUMPECTOMY  2007   left  . BUNIONECTOMY Right   . CATARACT EXTRACTION W/ INTRAOCULAR LENS IMPLANT & ANTERIOR VITRECTOMY, BILATERAL Bilateral   . COLON SURGERY  1993  . COLOSTOMY    . DILATION AND CURETTAGE, DIAGNOSTIC / THERAPEUTIC     Fibroid removal  . INSERT REPLACE REMOVE PACEMAKER     ppm medtronic [id number] Adapta dula chamber rate responsive. 02/21/14  . MASTECTOMY PARTIAL / LUMPECTOMY    .  PARTIAL COLECTOMY     Abdominal & Transanal   . TONSILLECTOMY    . TOTAL KNEE ARTHROPLASTY Right 07/03/2013   Right total knee arthroplasty using computer assisted navigation  . TUBAL LIGATION Bilateral 1971   with questionable appendectomy    No Known Allergies  Allergies as of 08/10/2016   No Known Allergies     Medication List       Accurate as of 08/10/16 10:29 AM. Always use your most recent med list.          acetaminophen 325 MG tablet Commonly known as:  TYLENOL Take 650 mg by mouth every 4 (four)  hours as needed for fever. Do not exceed 3000 mg in 24hrs   acetaminophen 500 MG tablet Commonly known as:  TYLENOL Take 1,000 mg by mouth daily. 5 pm   albuterol (2.5 MG/3ML) 0.083% nebulizer solution Commonly known as:  PROVENTIL Take 2.5 mg by nebulization every 6 (six) hours. For wheezing ,sob  per [facility], visit note 04/05/16   alum & mag hydroxide-simeth 389-373-42 MG/5ML suspension Commonly known as:  MAALOX PLUS Take 30 mLs by mouth every 4 (four) hours as needed.   aspirin EC 81 MG tablet Take 81 mg by mouth daily.   COMBIVENT RESPIMAT 20-100 MCG/ACT Aers respimat Generic drug:  Ipratropium-Albuterol Inhale 1 puff into the lungs every 6 (six) hours.   demeclocycline 300 MG tablet Commonly known as:  DECLOMYCIN Take 300 mg by mouth 2 (two) times daily.   divalproex 125 MG capsule Commonly known as:  DEPAKOTE SPRINKLE Take 125 mg by mouth 2 (two) times daily.   docusate sodium 100 MG capsule Commonly known as:  COLACE Take 100 mg by mouth daily.   DULoxetine 60 MG capsule Commonly known as:  CYMBALTA Take 60 mg by mouth daily.   eucerin cream Apply liberal amount topically to dry areas of skin daily after bath and as needed. Ok to keep at bedside   feeding supplement (PRO-STAT SUGAR FREE 64) Liqd Take 30 mLs by mouth daily.   fluticasone-salmeterol 115-21 MCG/ACT inhaler Commonly known as:  ADVAIR HFA Inhale 2 puffs into the lungs 2 (two) times daily. 8 am and 8 pm   furosemide 20 MG tablet Commonly known as:  LASIX Take 20 mg by mouth daily.   gabapentin 400 MG capsule Commonly known as:  NEURONTIN Take 400 mg by mouth 3 (three) times daily.   glipiZIDE-metformin 2.5-500 MG tablet Commonly known as:  METAGLIP Take 1 tablet by mouth daily.   ibuprofen 600 MG tablet Commonly known as:  ADVIL,MOTRIN Take 600 mg by mouth every 6 (six) hours as needed.   ketotifen 0.025 % ophthalmic solution Commonly known as:  ZADITOR Place 1 drop into  both eyes 2 (two) times daily.   LORazepam 1 MG tablet Commonly known as:  ATIVAN Take one tablet by mouth every night at bedtime for anxiety   magnesium oxide 400 MG tablet Commonly known as:  MAG-OX Take 400 mg by mouth 2 (two) times daily.   Melatonin 10 MG Tabs Take 1 tablet by mouth at bedtime.   meloxicam 7.5 MG tablet Commonly known as:  MOBIC Take 7.5 mg by mouth daily.   Menthol 1 MG Lozg Use as directed 1 lozenge in the mouth or throat every 6 (six) hours as needed.   nystatin powder Commonly known as:  MYCOSTATIN/NYSTOP Apply 1 application topically to abdominal folds daily for redness and irritation.   OXYGEN Inhale 2 L into  the lungs continuous. Titrate to maintain sats > 90 % as needed. Inhale at all times for COPD   pantoprazole 20 MG tablet Commonly known as:  PROTONIX Take 20 mg by mouth daily.   potassium chloride 10 MEQ tablet Commonly known as:  K-DUR,KLOR-CON Take 10 mEq by mouth every other day.   propranolol 20 MG tablet Commonly known as:  INDERAL Take 20 mg by mouth 2 (two) times daily.   SENIOR TABS Tabs Take 1 tablet by mouth daily.   senna-docusate 8.6-50 MG tablet Commonly known as:  Senokot-S Take 1 tablet by mouth 2 (two) times daily.   sucralfate 1 g tablet Commonly known as:  CARAFATE Take 1 g by mouth 2 (two) times daily. Tapering dose   trandolapril 2 MG tablet Commonly known as:  MAVIK Take 2 mg by mouth daily.   traZODone 100 MG tablet Commonly known as:  DESYREL Take 1 tablet (100 mg total) by mouth at bedtime.   Vitamin D3 2000 units Tabs Take 1 tablet by mouth daily.       Review of Systems  Constitutional: Negative for activity change, appetite change, chills, diaphoresis and fever.  HENT: Negative for congestion, sneezing, sore throat, trouble swallowing and voice change.   Eyes: Negative for pain, redness and visual disturbance.  Respiratory: Negative for apnea, cough, choking, chest tightness, shortness of  breath and wheezing.   Cardiovascular: Negative for chest pain, palpitations and leg swelling.  Gastrointestinal: Negative for abdominal distention, abdominal pain, constipation, diarrhea and nausea.  Genitourinary: Negative for difficulty urinating, dysuria, frequency and urgency.  Musculoskeletal: Negative for back pain, gait problem and myalgias. Arthralgias: typical arthritis.  Skin: Negative for color change, pallor, rash and wound.  Neurological: Negative for dizziness, tremors, syncope, speech difficulty, weakness, numbness and headaches.  Psychiatric/Behavioral: Negative for agitation and behavioral problems.  All other systems reviewed and are negative.   Immunization History  Administered Date(s) Administered  . Influenza Split 11/08/2013, 11/21/2014  . Influenza, High Dose Seasonal PF 12/05/2012  . Influenza-Unspecified 08/05/2015  . PPD Test 03/05/2014, 10/09/2015, 02/10/2016  . Pneumococcal Polysaccharide-23 05/16/2010  . Pneumococcal-Unspecified 03/05/2014   Pertinent  Health Maintenance Due  Topic Date Due  . FOOT EXAM  03/23/1944  . OPHTHALMOLOGY EXAM  03/23/1944  . DEXA SCAN  03/22/2001  . PNA vac Low Risk Adult (2 of 2 - PCV13) 03/05/2015  . INFLUENZA VACCINE  08/04/2016  . HEMOGLOBIN A1C  12/08/2016   Fall Risk  04/05/2016 12/15/2015 10/07/2015 09/09/2015  Falls in the past year? No No Yes Yes  Number falls in past yr: - - 2 or more 2 or more  Injury with Fall? - - No No  Risk Factor Category  - - - High Fall Risk  Risk for fall due to : - - - History of fall(s)  Follow up - - Education provided;Falls evaluation completed Education provided   Functional Status Survey:    Vitals:   08/10/16 1006  BP: (!) 130/55  Pulse: 71  Resp: 18  Temp: 97.9 F (36.6 C)  SpO2: 95%  Weight: 219 lb 11.2 oz (99.7 kg)  Height: 4\' 11"  (1.499 m)   Body mass index is 44.37 kg/m. Physical Exam  Constitutional: She is oriented to person, place, and time. Vital signs are  normal. She appears well-developed and well-nourished. She is active and cooperative. Nasal cannula in place.  Morbid obese in no acute distress   HENT:  Head: Normocephalic.  Mouth/Throat: Oropharynx is clear and moist.  No oropharyngeal exudate.  Eyes: Pupils are equal, round, and reactive to light. Conjunctivae and EOM are normal. Right eye exhibits no discharge. Left eye exhibits no discharge. No scleral icterus.  Neck: Normal range of motion. No JVD present. No thyromegaly present.  Cardiovascular: Normal rate, regular rhythm and intact distal pulses.  Exam reveals no gallop, no distant heart sounds and no friction rub.   No murmur heard. Pulses:      Dorsalis pedis pulses are 2+ on the right side, and 2+ on the left side.  Pulmonary/Chest: Effort normal. No respiratory distress. She has no decreased breath sounds. She has no wheezes. She has no rhonchi. She has no rales.  Oxygen 2 liters [place] in place.   Abdominal: Soft. Bowel sounds are normal. She exhibits no distension. There is no tenderness. There is no rebound and no guarding.  Musculoskeletal: Normal range of motion. She exhibits no edema, tenderness or deformity.  Moves x 4 extremities.Unsteady gait   Lymphadenopathy:    She has no cervical adenopathy.  Neurological: She is alert and oriented to person, place, and time.  Skin: Skin is warm and dry. No rash noted. No erythema. No pallor.  Psychiatric: She has a normal mood and affect.    Labs reviewed:  Recent Labs  02/12/16 1020  06/22/16 0545 06/25/16 1755 07/27/16 1700 08/05/16 0600  NA 119*  < > 126* 130* 126* 128*  K 5.2*  < > 4.5 4.4 5.1 5.0  CL 81*  < > 87* 92* 84* 88*  CO2 30  < > 31 32 34* 32  GLUCOSE 123*  < > 86 92 100* 95  BUN 15  < > 28* 19 21* 29*  CREATININE 0.78  < > 0.57 0.70 0.76 0.69  CALCIUM 8.3*  < > 8.9 8.9 9.1 9.0  MG 1.5*  --  1.8  --   --   --   < > = values in this interval not displayed.  Recent Labs  03/05/16 0725 03/25/16 0520  06/22/16 0545  AST 20 15 16   ALT 16 11* 11*  ALKPHOS 58 54 42  BILITOT 0.7 0.6 0.4  PROT 4.8* 5.4* 5.9*  ALBUMIN 2.3* 2.8* 3.3*    Recent Labs  03/05/16 0725 03/25/16 0520 06/22/16 1045  WBC 6.7 7.2 7.5  NEUTROABS 4.2 4.5 4.9  HGB 9.0* 9.6* 10.3*  HCT 26.8* 28.9* 31.2*  MCV 85.5 87.7 83.4  PLT 336 253 253   Lab Results  Component Value Date   TSH 2.998 02/12/2016   Lab Results  Component Value Date   HGBA1C 5.6 06/08/2016   Lab Results  Component Value Date   CHOL 106 02/12/2016   HDL 42 02/12/2016   LDLCALC 49 02/12/2016   TRIG 74 02/12/2016   CHOLHDL 2.5 02/12/2016    Significant Diagnostic Results in last 30 days:  No results found.  Assessment/Plan 1. Dependence on supplemental oxygen  Continue O2 2L [place]  2. Primary central sleep apnea  CPAP Q HS  3. Dependence on other enabling machines and devices  Cleanse CPAP mask, tubing, chamber water and small amount of dish soap once a week. Dry completely  4. Presence of cardiac pacemaker  Stable   5. Bradycardia, sinus  Stable   6. Second degree AV block  Stable   7. Single current episode of major depressive disorder, unspecified depression episode severity  Stable   Continue Duloxetine 60 mg po Q Day  8. Anxiety disorder, unspecified type  Stable  Continue Lorazepam 1 mg po Q HS  9. Vitamin D deficiency  Stable  Continue Cholecalciferol 2,000 units po Q Day  10. Insomnia, unspecified type  Stable  Continue Trazodone 100 mg po Q HS  11. Gastroesophageal reflux disease without esophagitis  Stable  Decrease Protonix to 20 mg po Q Day  Decrease Sucralfate to 1 gram BID- tapering dose off  12. Syndrome of inappropriate antidiuretic hormone (HCC)  Continue No sodium restriction diet  Stable   Continue Demeclocycline 600 mg po BID  13. Migraine without status migrainosus, not intractable, unspecified migraine type  Stable  Melatonin 10 mg po Q HS migraine  prophylaxis  Continue Depakote Sprinkles 125 mg po BID for headache prophylaxis  Continue Propranolol 20 mg po BID  14. Personal history of other malignant neoplasm of large intestine (CODE)  stable  15. Personal history of malignant neoplasm of breast  Stable   16. Mixed hyperlipidemia  Stable  No medications necessary  17. Type 2 diabetes, controlled, with neuropathy (HCC)  Stable  Continue glipizide-metformin tablet; 2.5-500 mg; amt: 1 tablet po Q Day  FSBS BID  Continue Gabapentin 400 mg po TID  Continue Mobix 7.5 mg po Q Day  18. Chronic obstructive pulmonary disease, unspecified COPD type (HCC)  Stable   Continue albuterol sulfate solution for nebulization; 2.5 mg /3 mL (0.083 %) Q 6 hours per instructions from [facility]  Continue Advair Diskus (fluticasone-salmeterol) blister with device; 250-50 mcg/dose 1 puff Q 12 hours  Continue Combivent Respimat 20-100- 1 inhalation QID  19. Hypertensive heart disease with CHF (congestive heart failure) (HCC)  Stable  Continue Lasix 40 mg po Q Day  Continue trandolapril 2 mg PO Q Day  Continue Aspir-81 (aspirin) [OTC] tablet,delayed release (DR/EC); 81 mg po Q Day  Continue potassium chloride tablet extended release; 10 mEq; amt: 1 tablet po Q Day  20. Nutritional Deficiency  Continue Pro-Stat 30 mL PO Q Day  Continue Multivitamin po Q Day  Continue Magnesium Oxide 400 mg po BID  21. Constipation  Continue Colace 100 mg po Q Day  Continue Senna-S po BID  Milk of Mag. 30 ml po Q day prn   Family/ staff Communication:   Total Time:  Documentation:  Face to Face:  Family/Phone:   Labs/tests ordered:     Medication list reviewed and assessed for continued appropriateness. Monthly medication orders reviewed and signed.  Vikki Ports, NP-C Geriatrics [facility] [phone], [place] [address] Cell Phone (Mon-Fri 8am-5pm):   [phone] On Call:  [phone] & follow prompts after 5pm & weekends Office Phone:  [phone] Office Fax:  [phone]

## 2016-08-19 ENCOUNTER — Other Ambulatory Visit
Admission: RE | Admit: 2016-08-19 | Discharge: 2016-08-19 | Disposition: A | Discharge status: Home / Self Care | Payer: Medicare Other | Source: Ambulatory Visit | Attending: Gerontology | Admitting: Gerontology

## 2016-08-19 DIAGNOSIS — E222 Syndrome of inappropriate secretion of antidiuretic hormone: Secondary | ICD-10-CM | POA: Diagnosis present

## 2016-08-19 LAB — COMPREHENSIVE METABOLIC PANEL
ALT: 13 U/L — AB (ref 14–54)
AST: 16 U/L (ref 15–41)
Albumin: 2.8 g/dL — ABNORMAL LOW (ref 3.5–5.0)
Alkaline Phosphatase: 53 U/L (ref 38–126)
Anion gap: 8 (ref 5–15)
BUN: 24 mg/dL — ABNORMAL HIGH (ref 6–20)
CHLORIDE: 90 mmol/L — AB (ref 101–111)
CO2: 36 mmol/L — ABNORMAL HIGH (ref 22–32)
CREATININE: 0.57 mg/dL (ref 0.44–1.00)
Calcium: 9 mg/dL (ref 8.9–10.3)
GFR calc non Af Amer: >60 mL/min (ref >60)
Glucose, Bld: 82 mg/dL (ref 65–99)
Potassium: 4.9 mmol/L (ref 3.5–5.1)
Sodium: 134 mmol/L — ABNORMAL LOW (ref 135–145)
TOTAL PROTEIN: 5.4 g/dL — AB (ref 6.5–8.1)
Total Bilirubin: 0.4 mg/dL (ref 0.3–1.2)

## 2016-08-19 LAB — CBC WITH DIFFERENTIAL/PLATELET
Basophils Absolute: 0.1 10*3/uL (ref 0–0.1)
Basophils Relative: 1 %
EOS PCT: 4 %
Eosinophils Absolute: 0.3 10*3/uL (ref 0–0.7)
HCT: 32.2 % — ABNORMAL LOW (ref 35.0–47.0)
Hemoglobin: 10.6 g/dL — ABNORMAL LOW (ref 12.0–16.0)
LYMPHS PCT: 31 %
Lymphs Abs: 2 10*3/uL (ref 1.0–3.6)
MCH: 27 pg (ref 26.0–34.0)
MCHC: 32.9 g/dL (ref 32.0–36.0)
MCV: 81.9 fL (ref 80.0–100.0)
MONO ABS: 0.7 10*3/uL (ref 0.2–0.9)
Monocytes Relative: 10 %
Neutro Abs: 3.5 10*3/uL (ref 1.4–6.5)
Neutrophils Relative %: 54 %
PLATELETS: 232 10*3/uL (ref 150–440)
RBC: 3.94 MIL/uL (ref 3.80–5.20)
RDW: 15.6 % — ABNORMAL HIGH (ref 11.5–14.5)
WBC: 6.5 10*3/uL (ref 3.6–11.0)

## 2016-08-20 ENCOUNTER — Other Ambulatory Visit: Payer: Self-pay

## 2016-08-20 MED ORDER — LORAZEPAM 1 MG PO TABS: ORAL_TABLET | ORAL | 5 refills | Status: DC

## 2016-08-20 NOTE — Telephone Encounter (Signed)
Rx sent to [facility] phone : [phone] , fax : [phone]  

## 2016-09-04 ENCOUNTER — Encounter
Admission: RE | Admit: 2016-09-04 | Discharge: 2016-09-04 | Disposition: A | Discharge status: Home / Self Care | Payer: Medicare Other | Source: Ambulatory Visit | Attending: Internal Medicine | Admitting: Internal Medicine

## 2016-09-13 ENCOUNTER — Non-Acute Institutional Stay (SKILLED_NURSING_FACILITY): Payer: Medicare Other | Admitting: Gerontology

## 2016-09-13 ENCOUNTER — Encounter: Payer: Self-pay | Admitting: Gerontology

## 2016-09-13 DIAGNOSIS — Z950 Presence of cardiac pacemaker: Secondary | ICD-10-CM

## 2016-09-13 DIAGNOSIS — E222 Syndrome of inappropriate secretion of antidiuretic hormone: Secondary | ICD-10-CM | POA: Diagnosis not present

## 2016-09-13 DIAGNOSIS — I441 Atrioventricular block, second degree: Secondary | ICD-10-CM

## 2016-09-13 DIAGNOSIS — Z9989 Dependence on other enabling machines and devices: Secondary | ICD-10-CM

## 2016-09-13 DIAGNOSIS — Z9981 Dependence on supplemental oxygen: Secondary | ICD-10-CM

## 2016-09-13 DIAGNOSIS — K219 Gastro-esophageal reflux disease without esophagitis: Secondary | ICD-10-CM | POA: Diagnosis not present

## 2016-09-13 DIAGNOSIS — K5900 Constipation, unspecified: Secondary | ICD-10-CM

## 2016-09-13 DIAGNOSIS — F329 Major depressive disorder, single episode, unspecified: Secondary | ICD-10-CM

## 2016-09-13 DIAGNOSIS — E559 Vitamin D deficiency, unspecified: Secondary | ICD-10-CM

## 2016-09-13 DIAGNOSIS — G4731 Primary central sleep apnea: Secondary | ICD-10-CM | POA: Diagnosis not present

## 2016-09-13 DIAGNOSIS — J449 Chronic obstructive pulmonary disease, unspecified: Secondary | ICD-10-CM

## 2016-09-13 DIAGNOSIS — G43909 Migraine, unspecified, not intractable, without status migrainosus: Secondary | ICD-10-CM

## 2016-09-13 DIAGNOSIS — R001 Bradycardia, unspecified: Secondary | ICD-10-CM | POA: Diagnosis not present

## 2016-09-13 DIAGNOSIS — Z853 Personal history of malignant neoplasm of breast: Secondary | ICD-10-CM

## 2016-09-13 DIAGNOSIS — E782 Mixed hyperlipidemia: Secondary | ICD-10-CM

## 2016-09-13 DIAGNOSIS — F419 Anxiety disorder, unspecified: Secondary | ICD-10-CM

## 2016-09-13 DIAGNOSIS — G4700 Insomnia, unspecified: Secondary | ICD-10-CM | POA: Diagnosis not present

## 2016-09-13 DIAGNOSIS — E639 Nutritional deficiency, unspecified: Secondary | ICD-10-CM

## 2016-09-13 DIAGNOSIS — I110 Hypertensive heart disease with heart failure: Secondary | ICD-10-CM

## 2016-09-13 DIAGNOSIS — E1140 Type 2 diabetes mellitus with diabetic neuropathy, unspecified: Secondary | ICD-10-CM

## 2016-09-13 NOTE — Progress Notes (Signed)
Location:   [facility] Room Number: [phone] Place of Service:  SNF [phone]) Provider:  Toni Arthurs, NP-C  Juluis Pitch, MD  Patient Care Team: Juluis Pitch, MD as PCP - General (Family Medicine) Alisa Graff, FNP as Nurse Practitioner (Family Medicine) Teodoro Spray, MD as Consulting Physician (Cardiology) Flora Lipps, MD as Consulting Physician (Pulmonary Disease)  Extended Emergency Contact Information Primary Emergency Contact: [name],[name] Address: [place], [place] [address] [name] of [place] Phone: [phone] Mobile Phone: [phone] Relation: Daughter Secondary Emergency Contact: Glaza,[name] Address: [address]          [place], [place] [address] [name] of [place] Phone: [phone] Relation: Son  Code Status:  FULL Goals of care: Advanced Directive information Advanced Directives 09/13/2016  Does Patient Have a Medical Advance Directive? No  Type of Advance Directive -  Does patient want to make changes to medical advance directive? -  Copy of [place] in Chart? -  Would patient like information on creating a medical advance directive? -     Chief Complaint  Patient presents with  . Medical Management of Chronic Issues    Routine Visit    HPI:  Pt is a 82 y.o. female seen today for medical management of chronic diseases. Pt has multiple medical co-morbidities. Conditions have been stable in the past month. Pt is resident in skilled nursing. Pt has adapted well. She does not interact with other residents much, but is pleasant. Pt reports she is feeling well at this time. Pt does c/o intermittent, though frequent migraines. She says the pain is in different locations. Nothing makes them better, but tries tylenol for the pain. Reports she has a headache almost everyday. She typically awakens in the morning with them. She was initiated on Melatonin and Depakote  for Migraine prophylaxis. Pt does report an improvement of symptoms. VSS. No other complaints.    Past Medical History:  Diagnosis Date  . Anxiety    unspecified  . Arthritis   . Breast cancer ([place]) 2007   Early stage left breast cancer status post partial colectomy, 04/2005 (Dr. Oliva Bustard)  . Chest pain, unspecified   . CHF (congestive heart failure) ([place])   . Chronic headache   . Colon cancer ([place])    status post partial colectomy, diagnosed 2000  . Colon neoplasm   . COPD (chronic obstructive pulmonary disease) ([facility] [place])   . Depression    unspecified  . Diabetes mellitus   . Fibrocystic breast   . History of breast cancer   . History of colon cancer   . HTN (hypertension) with goal to be determined   . Hyperlipidemia   . Hyperlipidemia, unspecified   . Hypertension   . Obesity, unspecified   . Sleep apnea    mild to moderate   Past Surgical History:  Procedure Laterality Date  . APPENDECTOMY    . BREAST LUMPECTOMY  2007   left  . BUNIONECTOMY Right   . CATARACT EXTRACTION W/ INTRAOCULAR LENS IMPLANT & ANTERIOR VITRECTOMY, BILATERAL Bilateral   . COLON SURGERY  1993  . COLOSTOMY    . DILATION AND CURETTAGE, DIAGNOSTIC / THERAPEUTIC     Fibroid removal  . INSERT REPLACE REMOVE PACEMAKER     ppm medtronic [id number] Adapta dula chamber rate responsive. 02/21/14  . MASTECTOMY PARTIAL / LUMPECTOMY    . PARTIAL COLECTOMY     Abdominal &  Transanal   . TONSILLECTOMY    . TOTAL KNEE ARTHROPLASTY Right 07/03/2013   Right total knee arthroplasty using computer assisted navigation  . TUBAL LIGATION Bilateral 1971   with questionable appendectomy    No Known Allergies  Allergies as of 09/13/2016   No Known Allergies     Medication List       Accurate as of 09/13/16  9:09 AM. Always use your most recent med list.          acetaminophen 325 MG tablet Commonly known as:  TYLENOL Take 650 mg by mouth every 4 (four) hours as needed for fever. Do not exceed 3000 mg in 24hrs     acetaminophen 500 MG tablet Commonly known as:  TYLENOL Take 1,000 mg by mouth daily. 5 pm   albuterol (2.5 MG/3ML) 0.083% nebulizer solution Commonly known as:  PROVENTIL Take 2.5 mg by nebulization every 6 (six) hours. For wheezing ,sob  per [facility], visit note 04/05/16   alum & mag hydroxide-simeth 614-431-54 MG/5ML suspension Commonly known as:  MAALOX PLUS Take 30 mLs by mouth every 4 (four) hours as needed.   aspirin EC 81 MG tablet Take 81 mg by mouth daily.   COMBIVENT RESPIMAT 20-100 MCG/ACT Aers respimat Generic drug:  Ipratropium-Albuterol Inhale 1 puff into the lungs every 6 (six) hours.   demeclocycline 300 MG tablet Commonly known as:  DECLOMYCIN Take 300 mg by mouth 2 (two) times daily.   divalproex 125 MG capsule Commonly known as:  DEPAKOTE SPRINKLE Take 125 mg by mouth 2 (two) times daily.   docusate sodium 100 MG capsule Commonly known as:  COLACE Take 100 mg by mouth daily.   DULoxetine 60 MG capsule Commonly known as:  CYMBALTA Take 60 mg by mouth daily.   eucerin cream Apply liberal amount topically to dry areas of skin daily after bath and as needed. Ok to keep at bedside   feeding supplement (PRO-STAT SUGAR FREE 64) Liqd Take 30 mLs by mouth daily.   fluticasone-salmeterol 115-21 MCG/ACT inhaler Commonly known as:  ADVAIR HFA Inhale 2 puffs into the lungs 2 (two) times daily. 8 am and 8 pm   furosemide 20 MG tablet Commonly known as:  LASIX Take 20 mg by mouth daily.   gabapentin 400 MG capsule Commonly known as:  NEURONTIN Take 400 mg by mouth 3 (three) times daily.   glipiZIDE-metformin 2.5-500 MG tablet Commonly known as:  METAGLIP Take 1 tablet by mouth daily.   ibuprofen 600 MG tablet Commonly known as:  ADVIL,MOTRIN Take 600 mg by mouth every 6 (six) hours as needed.   ketotifen 0.025 % ophthalmic solution Commonly known as:  ZADITOR Place 1 drop into both eyes 2 (two) times daily.   LORazepam 1 MG  tablet Commonly known as:  ATIVAN Take one tablet by mouth every night at bedtime for anxiety   magnesium oxide 400 MG tablet Commonly known as:  MAG-OX Take 400 mg by mouth 2 (two) times daily.   Melatonin 10 MG Tabs Take 1 tablet by mouth at bedtime.   meloxicam 7.5 MG tablet Commonly known as:  MOBIC Take 7.5 mg by mouth daily.   Menthol 1 MG Lozg Use as directed 1 lozenge in the mouth or throat every 6 (six) hours as needed.   nystatin powder Commonly known as:  MYCOSTATIN/NYSTOP Apply 1 application topically to abdominal folds daily for redness and irritation.   OXYGEN Inhale 2 L into the lungs continuous. Titrate to maintain  sats > 90 % as needed. Inhale at all times for COPD   pantoprazole 20 MG tablet Commonly known as:  PROTONIX Take 20 mg by mouth daily.   potassium chloride 10 MEQ tablet Commonly known as:  K-DUR,KLOR-CON Take 10 mEq by mouth every other day.   propranolol 20 MG tablet Commonly known as:  INDERAL Take 20 mg by mouth 2 (two) times daily.   SENIOR TABS Tabs Take 1 tablet by mouth daily.   senna-docusate 8.6-50 MG tablet Commonly known as:  Senokot-S Take 1 tablet by mouth 2 (two) times daily.   sucralfate 1 g tablet Commonly known as:  CARAFATE Take 1 g by mouth 2 (two) times daily. Tapering dose   trandolapril 2 MG tablet Commonly known as:  MAVIK Take 2 mg by mouth daily.   traZODone 100 MG tablet Commonly known as:  DESYREL Take 1 tablet (100 mg total) by mouth at bedtime.   Vitamin D3 2000 units Tabs Take 1 tablet by mouth daily.       Review of Systems  Constitutional: Negative for activity change, appetite change, chills, diaphoresis and fever.  HENT: Negative for congestion, sneezing, sore throat, trouble swallowing and voice change.   Respiratory: Negative for apnea, cough, choking, chest tightness, shortness of breath and wheezing.   Cardiovascular: Negative for chest pain, palpitations and leg swelling.   Gastrointestinal: Negative for abdominal distention, abdominal pain, constipation, diarrhea and nausea.  Genitourinary: Negative for difficulty urinating, dysuria, frequency and urgency.  Musculoskeletal: Positive for arthralgias (typical arthritis). Negative for back pain, gait problem and myalgias.  Skin: Negative for color change, pallor, rash and wound.  Neurological: Negative for dizziness, tremors, syncope, speech difficulty, weakness, numbness and headaches.  Psychiatric/Behavioral: Negative for agitation and behavioral problems.  All other systems reviewed and are negative.   Immunization History  Administered Date(s) Administered  . Influenza Split 11/08/2013, 11/21/2014  . Influenza, High Dose Seasonal PF 12/05/2012  . Influenza-Unspecified 08/05/2015  . PPD Test 03/05/2014, 10/09/2015, 02/10/2016  . Pneumococcal Polysaccharide-23 05/16/2010  . Pneumococcal-Unspecified 03/05/2014   Pertinent  Health Maintenance Due  Topic Date Due  . FOOT EXAM  03/23/1944  . OPHTHALMOLOGY EXAM  03/23/1944  . DEXA SCAN  03/22/2001  . PNA vac Low Risk Adult (2 of 2 - PCV13) 03/05/2015  . INFLUENZA VACCINE  08/04/2016  . HEMOGLOBIN A1C  12/08/2016   Fall Risk  04/05/2016 12/15/2015 10/07/2015 09/09/2015  Falls in the past year? No No Yes Yes  Number falls in past yr: - - 2 or more 2 or more  Injury with Fall? - - No No  Risk Factor Category  - - - High Fall Risk  Risk for fall due to : - - - History of fall(s)  Follow up - - Education provided;Falls evaluation completed Education provided   Functional Status Survey:    Vitals:   09/13/16 0842  BP: 130/66  Pulse: 69  Resp: 18  Temp: 98.1 F (36.7 C)  SpO2: 97%  Weight: 217 lb 4.8 oz (98.6 kg)  Height: _0  (1.499 m)   Body mass index is 43.89 kg/m. Physical Exam  Constitutional: She is oriented to person, place, and time. Vital signs are normal. She appears well-developed and well-nourished. She is active and cooperative.  Nasal cannula in place.  Morbid obese in no acute distress   HENT:  Head: Normocephalic.  Mouth/Throat: Oropharynx is clear and moist. No oropharyngeal exudate.  Eyes: Pupils are equal, round, and reactive to light. Conjunctivae  and EOM are normal. Right eye exhibits no discharge. Left eye exhibits no discharge. No scleral icterus.  Neck: Normal range of motion. No JVD present. No thyromegaly present.  Cardiovascular: Normal rate, regular rhythm and intact distal pulses.  Exam reveals no gallop, no distant heart sounds and no friction rub.   No murmur heard. Pulses:      Dorsalis pedis pulses are 2+ on the right side, and 2+ on the left side.  Pulmonary/Chest: Effort normal. No respiratory distress. She has no decreased breath sounds. She has no wheezes. She has no rhonchi. She has no rales.  Oxygen 2 liters [place] in place.   Abdominal: Soft. Bowel sounds are normal. She exhibits no distension. There is no tenderness. There is no rebound and no guarding.  Musculoskeletal: Normal range of motion. She exhibits no edema, tenderness or deformity.  Moves x 4 extremities.Unsteady gait   Lymphadenopathy:    She has no cervical adenopathy.  Neurological: She is alert and oriented to person, place, and time.  Skin: Skin is warm and dry. No rash noted. No erythema. No pallor.  Psychiatric: She has a normal mood and affect.    Labs reviewed:  Recent Labs  02/12/16 1020  06/22/16 0545  07/27/16 1700 08/05/16 0600 08/19/16 0700  NA 119*  < > 126*  < > 126* 128* 134*  K 5.2*  < > 4.5  < > 5.1 5.0 4.9  CL 81*  < > 87*  < > 84* 88* 90*  CO2 30  < > 31  < > 34* 32 36*  GLUCOSE 123*  < > 86  < > 100* 95 82  BUN 15  < > 28*  < > 21* 29* 24*  CREATININE 0.78  < > 0.57  < > 0.76 0.69 0.57  CALCIUM 8.3*  < > 8.9  < > 9.1 9.0 9.0  MG 1.5*  --  1.8  --   --   --   --   < > = values in this interval not displayed.  Recent Labs  03/25/16 0520 06/22/16 0545 08/19/16 0700  AST _0 ALT 11* 11*  13*  ALKPHOS 54 42 53  BILITOT 0.6 0.4 0.4  PROT 5.4* 5.9* 5.4*  ALBUMIN 2.8* 3.3* 2.8*    Recent Labs  03/25/16 0520 06/22/16 1045 08/19/16 0700  WBC 7.2 7.5 6.5  NEUTROABS 4.5 4.9 3.5  HGB 9.6* 10.3* 10.6*  HCT 28.9* 31.2* 32.2*  MCV 87.7 83.4 81.9  PLT 253 253 232   Lab Results  Component Value Date   TSH 2.998 02/12/2016   Lab Results  Component Value Date   HGBA1C 5.6 06/08/2016   Lab Results  Component Value Date   CHOL 106 02/12/2016   HDL 42 02/12/2016   LDLCALC 49 02/12/2016   TRIG 74 02/12/2016   CHOLHDL 2.5 02/12/2016    Significant Diagnostic Results in last 30 days:  No results found.  Assessment/Plan 1. Dependence on supplemental oxygen  Continue O2 2L [place]  2. Primary central sleep apnea  CPAP Q HS  3. Dependence on other enabling machines and devices  Cleanse CPAP mask, tubing, chamber water and small amount of dish soap once a week. Dry completely  4. Presence of cardiac pacemaker  Stable   5. Bradycardia, sinus  Stable   6. Second degree AV block  Stable   7. Single current episode of major depressive disorder, unspecified depression episode severity  Stable   Continue  Duloxetine 60 mg po Q Day  8. Anxiety disorder, unspecified type  Stable   Continue Lorazepam 1 mg po Q HS  9. Vitamin D deficiency  Stable  Continue Cholecalciferol 2,000 units po Q Day  10. Insomnia, unspecified type  Stable  Continue Trazodone 100 mg po Q HS  11. Gastroesophageal reflux disease without esophagitis  Stable  Decrease Protonix to 20 mg po Q Day  Decrease Sucralfate to 1 gram BID- tapering dose off  12. Syndrome of inappropriate antidiuretic hormone (HCC)  Continue No sodium restriction diet  Stable   Continue Demeclocycline 600 mg po BID  13. Migraine without status migrainosus, not intractable, unspecified migraine type  Variable   Melatonin 10 mg po Q HS migraine prophylaxis  Increase Depakote Sprinkles 250  mg po BID for headache prophylaxis  Continue Propranolol 20 mg po BID  Continue Acetaminophen 1,000 mg po Q Day  14. Personal history of other malignant neoplasm of large intestine (CODE)  stable  15. Personal history of malignant neoplasm of breast  Stable   16. Mixed hyperlipidemia  Stable  No medications necessary  17. Type 2 diabetes, controlled, with neuropathy (HCC)  Stable  Continue glipizide-metformin tablet; 2.5-500 mg; amt: 1 tablet po Q Day  FSBS BID  Continue Gabapentin 400 mg po TID  Continue Mobic 7.5 mg po Q Day  18. Chronic obstructive pulmonary disease, unspecified COPD type (HCC)  Stable   Continue albuterol sulfate solution for nebulization; 2.5 mg /3 mL (0.083 %) Q 6 hours per instructions from [facility]  Continue Advair Diskus (fluticasone-salmeterol) blister with device; 250-50 mcg/dose 1 puff Q 12 hours  Continue Combivent Respimat 20-100- 1 inhalation QID  19. Hypertensive heart disease with CHF (congestive heart failure) (HCC)  Stable  Continue Lasix 40 mg po Q Day  Continue trandolapril 2 mg PO Q Day  Continue Aspir-81 (aspirin) [OTC] tablet,delayed release (DR/EC); 81 mg po Q Day  Continue potassium chloride tablet extended release; 10 mEq; amt: 1 tablet po Q Day  20. Nutritional Deficiency  Continue Pro-Stat 30 mL PO Q Day  Continue Multivitamin po Q Day  Continue Magnesium Oxide 400 mg po BID  21. Constipation  Continue Colace 100 mg po Q Day  Continue Senna-S po BID  Milk of Mag. 30 ml po Q day prn   Family/ staff Communication:   Total Time:  Documentation:  Face to Face:  Family/Phone:   Labs/tests ordered:   Met B  Medication list reviewed and assessed for continued appropriateness. Monthly medication orders reviewed and signed.  Vikki Ports, NP-C Geriatrics [facility] [phone], [place] [address] Cell Phone (Mon-Fri 8am-5pm):   [phone] On Call:  [phone] & follow prompts after 5pm & weekends Office Phone:  [phone] Office Fax:  [phone]

## 2016-09-27 ENCOUNTER — Other Ambulatory Visit
Admission: RE | Admit: 2016-09-27 | Discharge: 2016-09-27 | Disposition: A | Discharge status: Home / Self Care | Payer: Medicare Other | Source: Ambulatory Visit | Attending: Gerontology | Admitting: Gerontology

## 2016-09-27 DIAGNOSIS — R410 Disorientation, unspecified: Secondary | ICD-10-CM | POA: Diagnosis present

## 2016-09-27 DIAGNOSIS — R5383 Other fatigue: Secondary | ICD-10-CM | POA: Insufficient documentation

## 2016-09-27 LAB — URINALYSIS, COMPLETE (UACMP) WITH MICROSCOPIC
Bilirubin Urine: NEGATIVE
GLUCOSE, UA: NEGATIVE mg/dL
Ketones, ur: NEGATIVE mg/dL
NITRITE: NEGATIVE
Protein, ur: NEGATIVE mg/dL
Specific Gravity, Urine: 1.01 (ref 1.005–1.030)
pH: 6 (ref 5.0–8.0)

## 2016-09-28 ENCOUNTER — Other Ambulatory Visit
Admission: RE | Admit: 2016-09-28 | Discharge: 2016-09-28 | Disposition: A | Discharge status: Home / Self Care | Payer: Medicare Other | Source: Ambulatory Visit | Attending: Gerontology | Admitting: Gerontology

## 2016-09-28 DIAGNOSIS — E222 Syndrome of inappropriate secretion of antidiuretic hormone: Secondary | ICD-10-CM | POA: Diagnosis present

## 2016-09-28 LAB — BASIC METABOLIC PANEL
ANION GAP: 12 (ref 5–15)
BUN: 24 mg/dL — AB (ref 6–20)
CO2: 28 mmol/L (ref 22–32)
Calcium: 8.7 mg/dL — ABNORMAL LOW (ref 8.9–10.3)
Chloride: 89 mmol/L — ABNORMAL LOW (ref 101–111)
Creatinine, Ser: 0.6 mg/dL (ref 0.44–1.00)
GFR calc Af Amer: >60 mL/min (ref >60)
GFR calc non Af Amer: >60 mL/min (ref >60)
GLUCOSE: 64 mg/dL — AB (ref 65–99)
POTASSIUM: 5.3 mmol/L — AB (ref 3.5–5.1)
Sodium: 129 mmol/L — ABNORMAL LOW (ref 135–145)

## 2016-09-28 LAB — COMPREHENSIVE METABOLIC PANEL
ALT: 14 U/L (ref 14–54)
AST: 16 U/L (ref 15–41)
Albumin: 2.3 g/dL — ABNORMAL LOW (ref 3.5–5.0)
Alkaline Phosphatase: 58 U/L (ref 38–126)
Anion gap: 9 (ref 5–15)
BUN: 22 mg/dL — ABNORMAL HIGH (ref 6–20)
CHLORIDE: 87 mmol/L — AB (ref 101–111)
CO2: 37 mmol/L — ABNORMAL HIGH (ref 22–32)
Calcium: 8.9 mg/dL (ref 8.9–10.3)
Creatinine, Ser: 0.62 mg/dL (ref 0.44–1.00)
GFR calc Af Amer: >60 mL/min (ref >60)
Glucose, Bld: 89 mg/dL (ref 65–99)
POTASSIUM: 4.6 mmol/L (ref 3.5–5.1)
SODIUM: 133 mmol/L — AB (ref 135–145)
Total Bilirubin: 0.5 mg/dL (ref 0.3–1.2)
Total Protein: 5.5 g/dL — ABNORMAL LOW (ref 6.5–8.1)

## 2016-09-28 LAB — CBC WITH DIFFERENTIAL/PLATELET
Basophils Absolute: 0 10*3/uL (ref 0–0.1)
Basophils Relative: 1 %
EOS PCT: 4 %
Eosinophils Absolute: 0.3 10*3/uL (ref 0–0.7)
HEMATOCRIT: 31.9 % — AB (ref 35.0–47.0)
Hemoglobin: 10.6 g/dL — ABNORMAL LOW (ref 12.0–16.0)
LYMPHS ABS: 1.2 10*3/uL (ref 1.0–3.6)
LYMPHS PCT: 14 %
MCH: 27.8 pg (ref 26.0–34.0)
MCHC: 33.2 g/dL (ref 32.0–36.0)
MCV: 83.7 fL (ref 80.0–100.0)
MONO ABS: 1 10*3/uL — AB (ref 0.2–0.9)
MONOS PCT: 12 %
NEUTROS ABS: 6.4 10*3/uL (ref 1.4–6.5)
Neutrophils Relative %: 71 %
PLATELETS: 273 10*3/uL (ref 150–440)
RBC: 3.81 MIL/uL (ref 3.80–5.20)
RDW: 16.6 % — AB (ref 11.5–14.5)
WBC: 9 10*3/uL (ref 3.6–11.0)

## 2016-09-29 LAB — URINE CULTURE: Culture: >=100000 — AB

## 2016-10-04 ENCOUNTER — Other Ambulatory Visit: Payer: Self-pay

## 2016-10-04 ENCOUNTER — Encounter
Admission: RE | Admit: 2016-10-04 | Discharge: 2016-10-04 | Disposition: A | Discharge status: Home / Self Care | Payer: Medicare Other | Source: Ambulatory Visit | Attending: Internal Medicine | Admitting: Internal Medicine

## 2016-10-04 MED ORDER — LORAZEPAM 0.5 MG PO TABS: 0.5000 mg | ORAL_TABLET | Freq: Every day | ORAL | 3 refills | Status: DC

## 2016-10-04 NOTE — Telephone Encounter (Signed)
Rx sent to [facility] phone : [phone] , fax : [phone]  

## 2016-10-13 ENCOUNTER — Encounter: Payer: Self-pay | Admitting: Gerontology

## 2016-10-13 ENCOUNTER — Non-Acute Institutional Stay (SKILLED_NURSING_FACILITY): Payer: Medicare Other | Admitting: Gerontology

## 2016-10-13 DIAGNOSIS — Z950 Presence of cardiac pacemaker: Secondary | ICD-10-CM | POA: Diagnosis not present

## 2016-10-13 DIAGNOSIS — G4731 Primary central sleep apnea: Secondary | ICD-10-CM | POA: Diagnosis not present

## 2016-10-13 DIAGNOSIS — E222 Syndrome of inappropriate secretion of antidiuretic hormone: Secondary | ICD-10-CM | POA: Diagnosis not present

## 2016-10-13 DIAGNOSIS — E559 Vitamin D deficiency, unspecified: Secondary | ICD-10-CM

## 2016-10-13 DIAGNOSIS — E782 Mixed hyperlipidemia: Secondary | ICD-10-CM

## 2016-10-13 DIAGNOSIS — Z9989 Dependence on other enabling machines and devices: Secondary | ICD-10-CM | POA: Diagnosis not present

## 2016-10-13 DIAGNOSIS — I441 Atrioventricular block, second degree: Secondary | ICD-10-CM | POA: Diagnosis not present

## 2016-10-13 DIAGNOSIS — Z853 Personal history of malignant neoplasm of breast: Secondary | ICD-10-CM

## 2016-10-13 DIAGNOSIS — K5900 Constipation, unspecified: Secondary | ICD-10-CM

## 2016-10-13 DIAGNOSIS — G43909 Migraine, unspecified, not intractable, without status migrainosus: Secondary | ICD-10-CM | POA: Diagnosis not present

## 2016-10-13 DIAGNOSIS — J449 Chronic obstructive pulmonary disease, unspecified: Secondary | ICD-10-CM

## 2016-10-13 DIAGNOSIS — K219 Gastro-esophageal reflux disease without esophagitis: Secondary | ICD-10-CM

## 2016-10-13 DIAGNOSIS — R001 Bradycardia, unspecified: Secondary | ICD-10-CM | POA: Diagnosis not present

## 2016-10-13 DIAGNOSIS — G4700 Insomnia, unspecified: Secondary | ICD-10-CM

## 2016-10-13 DIAGNOSIS — F419 Anxiety disorder, unspecified: Secondary | ICD-10-CM

## 2016-10-13 DIAGNOSIS — I110 Hypertensive heart disease with heart failure: Secondary | ICD-10-CM

## 2016-10-13 DIAGNOSIS — E639 Nutritional deficiency, unspecified: Secondary | ICD-10-CM

## 2016-10-13 DIAGNOSIS — Z9981 Dependence on supplemental oxygen: Secondary | ICD-10-CM

## 2016-10-13 DIAGNOSIS — E1140 Type 2 diabetes mellitus with diabetic neuropathy, unspecified: Secondary | ICD-10-CM

## 2016-10-13 NOTE — Progress Notes (Signed)
Location:   [facility] Room Number: [phone] Place of Service:  SNF [phone]) Provider:  Toni Arthurs, NP-C  Juluis Pitch, MD  Patient Care Team: Juluis Pitch, MD as PCP - General (Family Medicine) Alisa Graff, FNP as Nurse Practitioner (Family Medicine) Teodoro Spray, MD as Consulting Physician (Cardiology) Flora Lipps, MD as Consulting Physician (Pulmonary Disease)  Extended Emergency Contact Information Primary Emergency Contact: [name],[name] Address: [address]          [place], [place] [address] [name] of [place] Phone: [phone] Mobile Phone: [phone] Relation: Daughter Secondary Emergency Contact: Polson,[name] Address: [address]          [place], [place] [address] [name] of [place] Phone: [phone] Relation: Son  Code Status:  FULL Goals of care: Advanced Directive information Advanced Directives 10/13/2016  Does Patient Have a Medical Advance Directive? No  Type of Advance Directive -  Does patient want to make changes to medical advance directive? -  Copy of [place] in Chart? -  Would patient like information on creating a medical advance directive? -     Chief Complaint  Patient presents with  . Medical Management of Chronic Issues    Routine Visit    HPI:  Pt is a 82 y.o. female seen today for medical management of chronic diseases.  Patient has had multiple medical comorbidities.  Conditions have been stable in the past month.  Patient is resident of skilled nursing.  Patient has adapted well.  She does not interact with other residents much, but is pleasant.  Patient reports she is feeling well at this time.  Patient does complain of intermittent, though frequent migraines.  She says the pain is in different locations.  Nothing makes them better, but tries Tylenol for the pain.  Reports she has a headache almost every day.  She typically awakens in the morning with  them.  She was initiated on melatonin and Depakote with upward titration recently for migraine prophylaxis.  Patient does report an improvement of symptoms.  Vital signs stable.  No other complaints.   Past Medical History:  Diagnosis Date  . Anxiety    unspecified  . Arthritis   . Breast cancer ([place]) 2007   Early stage left breast cancer status post partial colectomy, 04/2005 (Dr. Oliva Bustard)  . Chest pain, unspecified   . CHF (congestive heart failure) ([place])   . Chronic headache   . Colon cancer ([place])    status post partial colectomy, diagnosed 2000  . Colon neoplasm   . COPD (chronic obstructive pulmonary disease) ([place])   . Depression    unspecified  . Diabetes mellitus   . Fibrocystic breast   . History of breast cancer   . History of colon cancer   . HTN (hypertension) with goal to be determined   . Hyperlipidemia   . Hyperlipidemia, unspecified   . Hypertension   . Obesity, unspecified   . Sleep apnea    mild to moderate   Past Surgical History:  Procedure Laterality Date  . APPENDECTOMY    . BREAST LUMPECTOMY  2007   left  . BUNIONECTOMY Right   . CATARACT EXTRACTION W/ INTRAOCULAR LENS IMPLANT & ANTERIOR VITRECTOMY, BILATERAL Bilateral   . COLON SURGERY  1993  . COLOSTOMY    . DILATION AND CURETTAGE, DIAGNOSTIC / THERAPEUTIC     Fibroid removal  . INSERT REPLACE REMOVE PACEMAKER     ppm medtronic [id number] Adapta  dula chamber rate responsive. 02/21/14  . MASTECTOMY PARTIAL / LUMPECTOMY    . PARTIAL COLECTOMY     Abdominal & Transanal   . TONSILLECTOMY    . TOTAL KNEE ARTHROPLASTY Right 07/03/2013   Right total knee arthroplasty using computer assisted navigation  . TUBAL LIGATION Bilateral 1971   with questionable appendectomy    No Known Allergies  Allergies as of 10/13/2016   No Known Allergies     Medication List       Accurate as of 10/13/16 10:06 AM. Always use your most recent med list.          acetaminophen 325 MG tablet Commonly known as:   TYLENOL Take 650 mg by mouth every 4 (four) hours as needed for fever. Do not exceed 3000 mg in 24hrs   acetaminophen 500 MG tablet Commonly known as:  TYLENOL Take 1,000 mg by mouth daily. 5 pm   albuterol (2.5 MG/3ML) 0.083% nebulizer solution Commonly known as:  PROVENTIL Take 2.5 mg by nebulization every 6 (six) hours. For wheezing ,sob  per [facility], visit note 04/05/16   alum & mag hydroxide-simeth 270-350-09 MG/5ML suspension Commonly known as:  MAALOX PLUS Take 30 mLs by mouth every 4 (four) hours as needed.   aspirin EC 81 MG tablet Take 81 mg by mouth daily.   COMBIVENT RESPIMAT 20-100 MCG/ACT Aers respimat Generic drug:  Ipratropium-Albuterol Inhale 1 puff into the lungs every 6 (six) hours.   demeclocycline 300 MG tablet Commonly known as:  DECLOMYCIN Take 300 mg by mouth 2 (two) times daily.   divalproex 125 MG capsule Commonly known as:  DEPAKOTE SPRINKLE Take 250 mg by mouth 2 (two) times daily.   docusate sodium 100 MG capsule Commonly known as:  COLACE Take 100 mg by mouth daily.   DULoxetine 60 MG capsule Commonly known as:  CYMBALTA Take 60 mg by mouth daily.   eucerin cream Apply liberal amount topically to dry areas of skin daily after bath and as needed. Ok to keep at bedside   feeding supplement (PRO-STAT SUGAR FREE 64) Liqd Take 30 mLs by mouth daily.   fluticasone-salmeterol 115-21 MCG/ACT inhaler Commonly known as:  ADVAIR HFA Inhale 2 puffs into the lungs 2 (two) times daily. 8 am and 8 pm   furosemide 20 MG tablet Commonly known as:  LASIX Take 20 mg by mouth daily.   gabapentin 400 MG capsule Commonly known as:  NEURONTIN Take 400 mg by mouth 3 (three) times daily.   glipiZIDE-metformin 2.5-500 MG tablet Commonly known as:  METAGLIP Take 1 tablet by mouth daily.   ibuprofen 600 MG tablet Commonly known as:  ADVIL,MOTRIN Take 600 mg by mouth every 6 (six) hours as needed.   ketotifen 0.025 % ophthalmic  solution Commonly known as:  ZADITOR Place 1 drop into both eyes 2 (two) times daily.   LORazepam 0.5 MG tablet Commonly known as:  ATIVAN Take 1 tablet (0.5 mg total) by mouth at bedtime.   magnesium oxide 400 MG tablet Commonly known as:  MAG-OX Take 400 mg by mouth 2 (two) times daily.   Melatonin 10 MG Tabs Take 1 tablet by mouth at bedtime.   meloxicam 7.5 MG tablet Commonly known as:  MOBIC Take 7.5 mg by mouth daily.   Menthol 1 MG Lozg Use as directed 1 lozenge in the mouth or throat every 6 (six) hours as needed.   nystatin powder Commonly known as:  MYCOSTATIN/NYSTOP Apply 1 application topically to  abdominal folds daily for redness and irritation.   OXYGEN Inhale 2 L into the lungs continuous. Titrate to maintain sats > 90 % as needed. Inhale at all times for COPD   pantoprazole 20 MG tablet Commonly known as:  PROTONIX Take 20 mg by mouth daily.   potassium chloride 10 MEQ tablet Commonly known as:  K-DUR,KLOR-CON Take 10 mEq by mouth every other day.   propranolol 20 MG tablet Commonly known as:  INDERAL Take 20 mg by mouth 2 (two) times daily.   SENIOR TABS Tabs Take 1 tablet by mouth daily.   senna-docusate 8.6-50 MG tablet Commonly known as:  Senokot-S Take 1 tablet by mouth 2 (two) times daily.   sucralfate 1 g tablet Commonly known as:  CARAFATE Take 1 g by mouth 2 (two) times daily. Tapering dose   trandolapril 2 MG tablet Commonly known as:  MAVIK Take 2 mg by mouth daily.   traZODone 100 MG tablet Commonly known as:  DESYREL Take 1 tablet (100 mg total) by mouth at bedtime.   Vitamin D3 2000 units Tabs Take 1 tablet by mouth daily.       Review of Systems  Constitutional: Negative for activity change, appetite change, chills, diaphoresis and fever.  HENT: Negative for congestion, sneezing, sore throat, trouble swallowing and voice change.   Respiratory: Negative for apnea, cough, choking, chest tightness, shortness of breath  and wheezing.   Cardiovascular: Negative for chest pain, palpitations and leg swelling.  Gastrointestinal: Negative for abdominal distention, abdominal pain, constipation, diarrhea and nausea.  Genitourinary: Negative for difficulty urinating, dysuria, frequency and urgency.  Musculoskeletal: Positive for arthralgias (typical arthritis). Negative for back pain, gait problem and myalgias.  Skin: Negative for color change, pallor, rash and wound.  Neurological: Negative for dizziness, tremors, syncope, speech difficulty, weakness, numbness and headaches.  Psychiatric/Behavioral: Negative for agitation and behavioral problems.  All other systems reviewed and are negative.   Immunization History  Administered Date(s) Administered  . Influenza Split 11/08/2013, 11/21/2014  . Influenza, High Dose Seasonal PF 12/05/2012  . Influenza-Unspecified 08/05/2015, 09/22/2016  . PPD Test 03/05/2014, 10/09/2015, 02/10/2016  . Pneumococcal Polysaccharide-23 05/16/2010  . Pneumococcal-Unspecified 03/05/2014   Pertinent  Health Maintenance Due  Topic Date Due  . FOOT EXAM  03/23/1944  . OPHTHALMOLOGY EXAM  03/23/1944  . DEXA SCAN  03/22/2001  . PNA vac Low Risk Adult (2 of 2 - PCV13) 03/05/2015  . HEMOGLOBIN A1C  12/08/2016  . INFLUENZA VACCINE  Completed   Fall Risk  04/05/2016 12/15/2015 10/07/2015 09/09/2015  Falls in the past year? No No Yes Yes  Number falls in past yr: - - 2 or more 2 or more  Injury with Fall? - - No No  Risk Factor Category  - - - High Fall Risk  Risk for fall due to : - - - History of fall(s)  Follow up - - Education provided;Falls evaluation completed Education provided   Functional Status Survey:    Vitals:   10/13/16 0947  BP: 129/69  Pulse: 77  Resp: 20  Temp: 97.8 F (36.6 C)  SpO2: 98%  Weight: 217 lb (98.4 kg)  Height: 4\' 11"  (1.499 m)   Body mass index is 43.83 kg/m. Physical Exam  Constitutional: She is oriented to person, place, and time. Vital signs  are normal. She appears well-developed and well-nourished. She is active and cooperative. She does not appear ill. No distress. Nasal cannula in place.  Morbid obese in no acute distress  HENT:  Head: Normocephalic and atraumatic.  Mouth/Throat: Uvula is midline, oropharynx is clear and moist and mucous membranes are normal. Mucous membranes are not pale, not dry and not cyanotic. No oropharyngeal exudate.  Eyes: Pupils are equal, round, and reactive to light. Conjunctivae, EOM and lids are normal. Right eye exhibits no discharge. Left eye exhibits no discharge. No scleral icterus.  Neck: Trachea normal, normal range of motion and full passive range of motion without pain. Neck supple. No JVD present. No tracheal deviation, no edema and no erythema present. No thyromegaly present.  Cardiovascular: Normal rate, regular rhythm, normal heart sounds, intact distal pulses and normal pulses.  Exam reveals no gallop, no distant heart sounds and no friction rub.   No murmur heard. Pulses:      Dorsalis pedis pulses are 2+ on the right side, and 2+ on the left side.  Pulmonary/Chest: Effort normal and breath sounds normal. No accessory muscle usage. No respiratory distress. She has no decreased breath sounds. She has no wheezes. She has no rhonchi. She has no rales. She exhibits no tenderness.  Oxygen 2 liters [place] in place.   Abdominal: Soft. Normal appearance and bowel sounds are normal. She exhibits no distension and no ascites. There is no tenderness. There is no rebound and no guarding.  Musculoskeletal: Normal range of motion. She exhibits no edema, tenderness or deformity.  Moves x 4 extremities.Unsteady gait   Lymphadenopathy:    She has no cervical adenopathy.  Neurological: She is alert and oriented to person, place, and time. She has normal strength.  Skin: Skin is warm, dry and intact. No rash noted. She is not diaphoretic. No cyanosis or erythema. No pallor. Nails show no clubbing.   Psychiatric: She has a normal mood and affect. Her speech is normal and behavior is normal. Judgment and thought content normal. Cognition and memory are normal.  Nursing note and vitals reviewed.   Labs reviewed:  Recent Labs  02/12/16 1020  06/22/16 0545  08/19/16 0700 09/28/16 0450 09/28/16 1130  NA 119*  < > 126*  < > 134* 129* 133*  K 5.2*  < > 4.5  < > 4.9 5.3* 4.6  CL 81*  < > 87*  < > 90* 89* 87*  CO2 30  < > 31  < > 36* 28 37*  GLUCOSE 123*  < > 86  < > 82 64* 89  BUN 15  < > 28*  < > 24* 24* 22*  CREATININE 0.78  < > 0.57  < > 0.57 0.60 0.62  CALCIUM 8.3*  < > 8.9  < > 9.0 8.7* 8.9  MG 1.5*  --  1.8  --   --   --   --   < > = values in this interval not displayed.  Recent Labs  06/22/16 0545 08/19/16 0700 09/28/16 1130  AST 16 16 16   ALT 11* 13* 14  ALKPHOS 42 53 58  BILITOT 0.4 0.4 0.5  PROT 5.9* 5.4* 5.5*  ALBUMIN 3.3* 2.8* 2.3*    Recent Labs  06/22/16 1045 08/19/16 0700 09/28/16 1130  WBC 7.5 6.5 9.0  NEUTROABS 4.9 3.5 6.4  HGB 10.3* 10.6* 10.6*  HCT 31.2* 32.2* 31.9*  MCV 83.4 81.9 83.7  PLT 253 232 273   Lab Results  Component Value Date   TSH 2.998 02/12/2016   Lab Results  Component Value Date   HGBA1C 5.6 06/08/2016   Lab Results  Component Value Date   CHOL  106 02/12/2016   HDL 42 02/12/2016   LDLCALC 49 02/12/2016   TRIG 74 02/12/2016   CHOLHDL 2.5 02/12/2016    Significant Diagnostic Results in last 30 days:  No results found.  Assessment/Plan 1. Dependence on supplemental oxygen  Continue O2 2L [place]  2. Primary central sleep apnea  CPAP Q HS  3. Dependence on other enabling machines and devices  Cleanse CPAP mask, tubing, chamber water and small amount of dish soap once a week. Dry completely  4. Presence of cardiac pacemaker  Stable   5. Bradycardia, sinus  Stable   6. Second degree AV block  Stable   7. Single current episode of major depressive disorder, unspecified depression episode  severity  Stable   Continue Duloxetine 60 mg po Q Day  8. Anxiety disorder, unspecified type  Stable   Continue Lorazepam 1 mg po Q HS  9. Vitamin D deficiency  Stable  Continue Cholecalciferol 2,000 units po Q Day  10. Insomnia, unspecified type  Stable  Continue Trazodone 100 mg po Q HS  11. Gastroesophageal reflux disease without esophagitis  Stable  Decrease Protonix to 20 mg po Q Day  Decrease Sucralfate to 1 gram BID- tapering dose off  12. Syndrome of inappropriate antidiuretic hormone (HCC)  Continue No sodium restriction diet  Stable   Continue Demeclocycline 600 mg po BID  13. Migraine without status migrainosus, not intractable, unspecified migraine type  Variable   Continue Melatonin 10 mg po Q HS migraine prophylaxis  Continue Depakote Sprinkles 250 mg po BID for headache prophylaxis  Continue Propranolol 20 mg po BID  Continue Acetaminophen 1,000 mg po Q Day  14. Personal history of other malignant neoplasm of large intestine (CODE)  stable  15. Personal history of malignant neoplasm of breast  Stable   16. Mixed hyperlipidemia  Stable  No medications necessary  17. Type 2 diabetes, controlled, with neuropathy (HCC)  Stable  Continue glipizide-metformin tablet; 2.5-500 mg; amt: 1 tablet po Q Day  FSBS BID  Continue Gabapentin 400 mg po TID  Continue Mobic 7.5 mg po Q Day  18. Chronic obstructive pulmonary disease, unspecified COPD type (HCC)  Stable   Continue albuterol sulfate solution for nebulization; 2.5 mg /3 mL (0.083 %) Q 6 hours per instructions from [facility]  Continue Advair Diskus (fluticasone-salmeterol) blister with device; 250-50 mcg/dose 1 puff Q 12 hours  Continue Combivent Respimat 20-100- 1 inhalation QID  19. Hypertensive heart disease with CHF (congestive heart failure) (HCC)  Stable  Continue Lasix 40 mg po Q Day  Continue trandolapril 2 mg PO Q Day  Continue Aspir-81  (aspirin) [OTC] tablet,delayed release (DR/EC); 81 mg po Q Day  Continue potassium chloride tablet extended release; 10 mEq; amt: 1 tablet po Q Day  20. Nutritional Deficiency  Continue Pro-Stat 30 mL PO Q Day  Continue Multivitamin po Q Day  Continue Magnesium Oxide 400 mg po BID  21. Constipation  Continue Colace 100 mg po Q Day  Continue Senna-S po BID  Milk of Mag. 30 ml po Q day prn  Continue all medications as listed above unless otherwise indicated   Family/ staff Communication:   Total Time:  Documentation:  Face to Face:  Family/Phone:   Labs/tests ordered:     Medication list reviewed and assessed for continued appropriateness. Monthly medication orders reviewed and signed.  Vikki Ports, NP-C Geriatrics [facility] [phone], [place] [address] Cell Phone (  Mon-Fri 8am-5pm):  [phone] On Call:  [phone] & follow prompts after 5pm & weekends Office Phone:  [phone] Office Fax:  [phone]

## 2016-11-04 ENCOUNTER — Encounter
Admission: RE | Admit: 2016-11-04 | Discharge: 2016-11-04 | Disposition: A | Discharge status: Home / Self Care | Payer: Medicare Other | Source: Ambulatory Visit | Attending: Internal Medicine | Admitting: Internal Medicine

## 2016-11-04 DIAGNOSIS — E222 Syndrome of inappropriate secretion of antidiuretic hormone: Secondary | ICD-10-CM | POA: Insufficient documentation

## 2016-11-11 ENCOUNTER — Non-Acute Institutional Stay (SKILLED_NURSING_FACILITY): Payer: Medicare Other | Admitting: Gerontology

## 2016-11-11 ENCOUNTER — Encounter: Payer: Self-pay | Admitting: Gerontology

## 2016-11-11 DIAGNOSIS — E559 Vitamin D deficiency, unspecified: Secondary | ICD-10-CM

## 2016-11-11 DIAGNOSIS — K219 Gastro-esophageal reflux disease without esophagitis: Secondary | ICD-10-CM | POA: Diagnosis not present

## 2016-11-11 DIAGNOSIS — G4700 Insomnia, unspecified: Secondary | ICD-10-CM

## 2016-11-11 DIAGNOSIS — Z950 Presence of cardiac pacemaker: Secondary | ICD-10-CM | POA: Diagnosis not present

## 2016-11-11 DIAGNOSIS — R001 Bradycardia, unspecified: Secondary | ICD-10-CM

## 2016-11-11 DIAGNOSIS — F419 Anxiety disorder, unspecified: Secondary | ICD-10-CM

## 2016-11-11 DIAGNOSIS — G43909 Migraine, unspecified, not intractable, without status migrainosus: Secondary | ICD-10-CM

## 2016-11-11 DIAGNOSIS — Z9981 Dependence on supplemental oxygen: Secondary | ICD-10-CM

## 2016-11-11 DIAGNOSIS — G4731 Primary central sleep apnea: Secondary | ICD-10-CM | POA: Diagnosis not present

## 2016-11-11 DIAGNOSIS — E222 Syndrome of inappropriate secretion of antidiuretic hormone: Secondary | ICD-10-CM

## 2016-11-11 DIAGNOSIS — Z9989 Dependence on other enabling machines and devices: Secondary | ICD-10-CM

## 2016-11-11 DIAGNOSIS — E782 Mixed hyperlipidemia: Secondary | ICD-10-CM

## 2016-11-11 DIAGNOSIS — I441 Atrioventricular block, second degree: Secondary | ICD-10-CM | POA: Diagnosis not present

## 2016-11-11 DIAGNOSIS — Z853 Personal history of malignant neoplasm of breast: Secondary | ICD-10-CM

## 2016-11-11 DIAGNOSIS — J449 Chronic obstructive pulmonary disease, unspecified: Secondary | ICD-10-CM

## 2016-11-11 DIAGNOSIS — I110 Hypertensive heart disease with heart failure: Secondary | ICD-10-CM

## 2016-11-11 DIAGNOSIS — E1140 Type 2 diabetes mellitus with diabetic neuropathy, unspecified: Secondary | ICD-10-CM

## 2016-11-11 DIAGNOSIS — E639 Nutritional deficiency, unspecified: Secondary | ICD-10-CM

## 2016-11-11 DIAGNOSIS — K5900 Constipation, unspecified: Secondary | ICD-10-CM

## 2016-11-19 ENCOUNTER — Other Ambulatory Visit
Admission: RE | Admit: 2016-11-19 | Discharge: 2016-11-19 | Disposition: A | Discharge status: Home / Self Care | Payer: Medicare Other | Source: Ambulatory Visit | Attending: Gerontology | Admitting: Gerontology

## 2016-11-19 DIAGNOSIS — E222 Syndrome of inappropriate secretion of antidiuretic hormone: Secondary | ICD-10-CM | POA: Diagnosis present

## 2016-11-19 LAB — COMPREHENSIVE METABOLIC PANEL
ALT: 13 U/L — ABNORMAL LOW (ref 14–54)
AST: 19 U/L (ref 15–41)
Albumin: 2.6 g/dL — ABNORMAL LOW (ref 3.5–5.0)
Alkaline Phosphatase: 50 U/L (ref 38–126)
Anion gap: 9 (ref 5–15)
BUN: 14 mg/dL (ref 6–20)
CHLORIDE: 85 mmol/L — AB (ref 101–111)
CO2: 37 mmol/L — AB (ref 22–32)
Calcium: 8.8 mg/dL — ABNORMAL LOW (ref 8.9–10.3)
Creatinine, Ser: 0.47 mg/dL (ref 0.44–1.00)
GFR calc Af Amer: >60 mL/min (ref >60)
Glucose, Bld: 103 mg/dL — ABNORMAL HIGH (ref 65–99)
POTASSIUM: 4.6 mmol/L (ref 3.5–5.1)
SODIUM: 131 mmol/L — AB (ref 135–145)
Total Bilirubin: 0.6 mg/dL (ref 0.3–1.2)
Total Protein: 5.6 g/dL — ABNORMAL LOW (ref 6.5–8.1)

## 2016-11-19 LAB — CBC WITH DIFFERENTIAL/PLATELET
BASOS ABS: 0 10*3/uL (ref 0–0.1)
Basophils Relative: 1 %
EOS ABS: 0.4 10*3/uL (ref 0–0.7)
EOS PCT: 5 %
HCT: 32.9 % — ABNORMAL LOW (ref 35.0–47.0)
Hemoglobin: 10.6 g/dL — ABNORMAL LOW (ref 12.0–16.0)
LYMPHS PCT: 25 %
Lymphs Abs: 1.7 10*3/uL (ref 1.0–3.6)
MCH: 28.8 pg (ref 26.0–34.0)
MCHC: 32.3 g/dL (ref 32.0–36.0)
MCV: 89.1 fL (ref 80.0–100.0)
MONO ABS: 0.6 10*3/uL (ref 0.2–0.9)
Monocytes Relative: 8 %
Neutro Abs: 4.4 10*3/uL (ref 1.4–6.5)
Neutrophils Relative %: 61 %
PLATELETS: 252 10*3/uL (ref 150–440)
RBC: 3.69 MIL/uL — AB (ref 3.80–5.20)
RDW: 16.3 % — AB (ref 11.5–14.5)
WBC: 7.1 10*3/uL (ref 3.6–11.0)

## 2016-12-04 ENCOUNTER — Encounter
Admission: RE | Admit: 2016-12-04 | Discharge: 2016-12-04 | Disposition: A | Discharge status: Home / Self Care | Payer: Medicare Other | Source: Ambulatory Visit | Attending: Internal Medicine | Admitting: Internal Medicine

## 2016-12-17 ENCOUNTER — Encounter: Payer: Self-pay | Admitting: Gerontology

## 2016-12-17 ENCOUNTER — Non-Acute Institutional Stay (SKILLED_NURSING_FACILITY): Payer: Medicare Other | Admitting: Gerontology

## 2016-12-17 DIAGNOSIS — F329 Major depressive disorder, single episode, unspecified: Secondary | ICD-10-CM | POA: Diagnosis not present

## 2016-12-17 DIAGNOSIS — E222 Syndrome of inappropriate secretion of antidiuretic hormone: Secondary | ICD-10-CM | POA: Diagnosis not present

## 2016-12-17 DIAGNOSIS — E639 Nutritional deficiency, unspecified: Secondary | ICD-10-CM

## 2016-12-17 DIAGNOSIS — Z85038 Personal history of other malignant neoplasm of large intestine: Secondary | ICD-10-CM

## 2016-12-17 DIAGNOSIS — Z9981 Dependence on supplemental oxygen: Secondary | ICD-10-CM | POA: Diagnosis not present

## 2016-12-17 DIAGNOSIS — Z9989 Dependence on other enabling machines and devices: Secondary | ICD-10-CM | POA: Diagnosis not present

## 2016-12-17 DIAGNOSIS — K219 Gastro-esophageal reflux disease without esophagitis: Secondary | ICD-10-CM

## 2016-12-17 DIAGNOSIS — G4731 Primary central sleep apnea: Secondary | ICD-10-CM | POA: Diagnosis not present

## 2016-12-17 DIAGNOSIS — E1140 Type 2 diabetes mellitus with diabetic neuropathy, unspecified: Secondary | ICD-10-CM

## 2016-12-17 DIAGNOSIS — G4700 Insomnia, unspecified: Secondary | ICD-10-CM | POA: Diagnosis not present

## 2016-12-17 DIAGNOSIS — I441 Atrioventricular block, second degree: Secondary | ICD-10-CM

## 2016-12-17 DIAGNOSIS — E559 Vitamin D deficiency, unspecified: Secondary | ICD-10-CM

## 2016-12-17 DIAGNOSIS — R001 Bradycardia, unspecified: Secondary | ICD-10-CM

## 2016-12-17 DIAGNOSIS — J449 Chronic obstructive pulmonary disease, unspecified: Secondary | ICD-10-CM

## 2016-12-17 DIAGNOSIS — K5900 Constipation, unspecified: Secondary | ICD-10-CM

## 2016-12-17 DIAGNOSIS — E782 Mixed hyperlipidemia: Secondary | ICD-10-CM

## 2016-12-17 DIAGNOSIS — Z950 Presence of cardiac pacemaker: Secondary | ICD-10-CM

## 2016-12-17 DIAGNOSIS — F419 Anxiety disorder, unspecified: Secondary | ICD-10-CM

## 2016-12-17 DIAGNOSIS — G43909 Migraine, unspecified, not intractable, without status migrainosus: Secondary | ICD-10-CM

## 2016-12-17 DIAGNOSIS — Z853 Personal history of malignant neoplasm of breast: Secondary | ICD-10-CM

## 2016-12-17 DIAGNOSIS — I110 Hypertensive heart disease with heart failure: Secondary | ICD-10-CM

## 2016-12-21 ENCOUNTER — Non-Acute Institutional Stay (SKILLED_NURSING_FACILITY): Payer: Medicare Other | Admitting: Gerontology

## 2016-12-21 ENCOUNTER — Other Ambulatory Visit
Admission: RE | Admit: 2016-12-21 | Discharge: 2016-12-21 | Disposition: A | Discharge status: Home / Self Care | Payer: Medicare Other | Source: Ambulatory Visit | Attending: Gerontology | Admitting: Gerontology

## 2016-12-21 DIAGNOSIS — N3000 Acute cystitis without hematuria: Secondary | ICD-10-CM

## 2016-12-21 DIAGNOSIS — R300 Dysuria: Secondary | ICD-10-CM | POA: Diagnosis present

## 2016-12-21 DIAGNOSIS — J441 Chronic obstructive pulmonary disease with (acute) exacerbation: Secondary | ICD-10-CM

## 2016-12-21 LAB — URINALYSIS, COMPLETE (UACMP) WITH MICROSCOPIC
BILIRUBIN URINE: NEGATIVE
Bacteria, UA: NONE SEEN
GLUCOSE, UA: NEGATIVE mg/dL
KETONES UR: NEGATIVE mg/dL
NITRITE: NEGATIVE
PH: 6 (ref 5.0–8.0)
Protein, ur: 30 mg/dL — AB
SPECIFIC GRAVITY, URINE: 1.01 (ref 1.005–1.030)

## 2016-12-22 ENCOUNTER — Encounter: Payer: Self-pay | Admitting: Gerontology

## 2016-12-22 NOTE — Progress Notes (Signed)
Location:   [facility] Room Number: [phone] Place of Service:  SNF [phone]) Provider:  Toni Arthurs, NP-C  Frazier Richards, MD  Patient Care Team: Frazier Richards, MD as PCP - General (Family Medicine) Alisa Graff, FNP as Nurse Practitioner (Family Medicine) Teodoro Spray, MD as Consulting Physician (Cardiology) Flora Lipps, MD as Consulting Physician (Pulmonary Disease)  Extended Emergency Contact Information Primary Emergency Contact: [name],[name] Address: [address]          [place], [place] [address] [name] of [place] Phone: [phone] Mobile Phone: [phone] Relation: Daughter Secondary Emergency Contact: Heathman,[name] Address: [address]          [place], [place] [address] [name] of [place] Phone: [phone] Relation: Son  Code Status:  FULL Goals of care: Advanced Directive information Advanced Directives 12/21/2016  Does Patient Have a Medical Advance Directive? No  Type of Advance Directive -  Does patient want to make changes to medical advance directive? -  Copy of [place] in Chart? -  Would patient like information on creating a medical advance directive? No - Patient declined     Chief Complaint  Patient presents with  . Acute Visit    Recheck cough    HPI:  Pt is a 82 y.o. female seen today for an acute visit for cough and congestion.  Patient reports over the weekend she started having an increase in chest congestion with a productive cough.  Patient is unable to expectorate sputum.  Patient also had low-grade fever, along with general malaise.  Patient also reports some lower abdominal tenderness with burning with urination.  Patient denies chest pain, but does endorse some shortness of breath.  Verbal order was given to obtain chest x-ray along with sending off a urine for UA, culture and sensitivity. CXR ruled out pneumonia. + UTI.  Vital signs stable.  No other  complaints   Past Medical History:  Diagnosis Date  . Anxiety    unspecified  . Arthritis   . Breast cancer ([place]) 2007   Early stage left breast cancer status post partial colectomy, 04/2005 (Dr. Oliva Bustard)  . Chest pain, unspecified   . CHF (congestive heart failure) ([place])   . Chronic headache   . Colon cancer ([place])    status post partial colectomy, diagnosed 2000  . Colon neoplasm   . COPD (chronic obstructive pulmonary disease) ([place])   . Depression    unspecified  . Diabetes mellitus   . Fibrocystic breast   . History of breast cancer   . History of colon cancer   . HTN (hypertension) with goal to be determined   . Hyperlipidemia   . Hyperlipidemia, unspecified   . Hypertension   . Obesity, unspecified   . Sleep apnea    mild to moderate   Past Surgical History:  Procedure Laterality Date  . APPENDECTOMY    . BREAST LUMPECTOMY  2007   left  . BUNIONECTOMY Right   . CATARACT EXTRACTION W/ INTRAOCULAR LENS IMPLANT & ANTERIOR VITRECTOMY, BILATERAL Bilateral   . COLON SURGERY  1993  . COLOSTOMY    . DILATION AND CURETTAGE, DIAGNOSTIC / THERAPEUTIC     Fibroid removal  . INSERT REPLACE REMOVE PACEMAKER     ppm medtronic [id number] Adapta dula chamber rate responsive. 02/21/14  . MASTECTOMY PARTIAL / LUMPECTOMY    . PARTIAL COLECTOMY     Abdominal & Transanal   . TONSILLECTOMY    . TOTAL  KNEE ARTHROPLASTY Right 07/03/2013   Right total knee arthroplasty using computer assisted navigation  . TUBAL LIGATION Bilateral 1971   with questionable appendectomy    No Known Allergies  Allergies as of 12/21/2016   No Known Allergies     Medication List        Accurate as of 12/21/16 11:59 PM. Always use your most recent med list.          acetaminophen 325 MG tablet Commonly known as:  TYLENOL Take 650 mg by mouth every 4 (four) hours as needed for fever. Do not exceed 3000 mg in 24hrs   acetaminophen 500 MG tablet Commonly known as:  TYLENOL Take 1,000 mg by mouth  daily. 5 pm   albuterol (2.5 MG/3ML) 0.083% nebulizer solution Commonly known as:  PROVENTIL Take 2.5 mg by nebulization every 6 (six) hours as needed for wheezing or shortness of breath. For wheezing ,sob  per [facility], visit note 04/05/16   alum & mag hydroxide-simeth 938-101-75 MG/5ML suspension Commonly known as:  MAALOX PLUS Take 30 mLs by mouth every 4 (four) hours as needed.   aspirin EC 81 MG tablet Take 81 mg by mouth daily.   COMBIVENT RESPIMAT 20-100 MCG/ACT Aers respimat Generic drug:  Ipratropium-Albuterol Inhale 1 puff into the lungs every 6 (six) hours.   demeclocycline 300 MG tablet Commonly known as:  DECLOMYCIN Take 600 mg 2 (two) times daily by mouth. 2 tabs   divalproex 125 MG capsule Commonly known as:  DEPAKOTE SPRINKLE Take 250 mg by mouth 2 (two) times daily.   docusate sodium 100 MG capsule Commonly known as:  COLACE Take 100 mg by mouth daily.   DULoxetine 60 MG capsule Commonly known as:  CYMBALTA Take 60 mg by mouth daily.   eucerin cream Apply liberal amount topically to dry areas of skin daily after bath and as needed. Ok to keep at bedside   feeding supplement (PRO-STAT SUGAR FREE 64) Liqd Take 30 mLs by mouth 2 (two) times daily between meals.   fluticasone-salmeterol 115-21 MCG/ACT inhaler Commonly known as:  ADVAIR HFA Inhale 2 puffs into the lungs 2 (two) times daily. 8 am and 8 pm   furosemide 20 MG tablet Commonly known as:  LASIX Take 20 mg by mouth daily.   gabapentin 400 MG capsule Commonly known as:  NEURONTIN Take 400 mg by mouth 3 (three) times daily.   glipiZIDE-metformin 2.5-500 MG tablet Commonly known as:  METAGLIP Take 1 tablet by mouth daily.   GLUCERNA Liqd Take 237 mLs by mouth 2 (two) times daily between meals.   ibuprofen 600 MG tablet Commonly known as:  ADVIL,MOTRIN Take 600 mg by mouth every 6 (six) hours as needed for headache.   ketotifen 0.025 % ophthalmic solution Commonly known as:   ZADITOR Place 1 drop into both eyes 2 (two) times daily.   LORazepam 0.5 MG tablet Commonly known as:  ATIVAN Take 1 tablet (0.5 mg total) by mouth at bedtime.   magnesium oxide 400 MG tablet Commonly known as:  MAG-OX Take 400 mg by mouth 2 (two) times daily.   Melatonin 10 MG Tabs Take 1 tablet by mouth at bedtime.   meloxicam 7.5 MG tablet Commonly known as:  MOBIC Take 7.5 mg by mouth daily.   Menthol 1 MG Lozg Use as directed 1 lozenge in the mouth or throat every 6 (six) hours as needed.   nystatin powder Commonly known as:  MYCOSTATIN/NYSTOP Apply 1 application topically to  abdominal folds daily for redness and irritation.   OXYGEN Inhale 2 L into the lungs continuous. Titrate to maintain sats > 90 % as needed. Inhale at all times for COPD   pantoprazole 20 MG tablet Commonly known as:  PROTONIX Take 20 mg by mouth daily.   potassium chloride 10 MEQ tablet Commonly known as:  K-DUR,KLOR-CON Take 10 mEq by mouth every other day.   SENIOR TABS Tabs Take 1 tablet by mouth daily.   senna-docusate 8.6-50 MG tablet Commonly known as:  Senokot-S Take 1 tablet by mouth 2 (two) times daily.   sucralfate 1 g tablet Commonly known as:  CARAFATE Take 1 g by mouth daily. Tapering dose   trandolapril 2 MG tablet Commonly known as:  MAVIK Take 2 mg by mouth daily.   traZODone 100 MG tablet Commonly known as:  DESYREL Take 1 tablet (100 mg total) by mouth at bedtime.   Vitamin D3 2000 units Tabs Take 1 tablet by mouth daily.       Review of Systems  Constitutional: Positive for fever (low grade). Negative for activity change, appetite change, chills and diaphoresis.  HENT: Negative for congestion, mouth sores, nosebleeds, postnasal drip, sneezing, sore throat, trouble swallowing and voice change.   Respiratory: Positive for cough, shortness of breath and wheezing. Negative for apnea, choking and chest tightness.   Cardiovascular: Negative for chest pain,  palpitations and leg swelling.  Gastrointestinal: Negative for abdominal distention, abdominal pain, constipation, diarrhea and nausea.  Genitourinary: Positive for dysuria. Negative for difficulty urinating, frequency and urgency.  Musculoskeletal: Positive for arthralgias (typical arthritis). Negative for back pain, gait problem and myalgias.  Skin: Negative for color change, pallor, rash and wound.  Neurological: Positive for headaches. Negative for dizziness, tremors, syncope, speech difficulty, weakness and numbness.  Psychiatric/Behavioral: Negative for agitation and behavioral problems.  All other systems reviewed and are negative.   Immunization History  Administered Date(s) Administered  . Influenza Split 11/08/2013, 11/21/2014  . Influenza, High Dose Seasonal PF 12/05/2012  . Influenza-Unspecified 08/05/2015, 09/22/2016  . PPD Test 03/05/2014, 10/09/2015, 02/10/2016  . Pneumococcal Polysaccharide-23 05/16/2010  . Pneumococcal-Unspecified 03/05/2014   Pertinent  Health Maintenance Due  Topic Date Due  . FOOT EXAM  03/23/1944  . OPHTHALMOLOGY EXAM  03/23/1944  . DEXA SCAN  03/22/2001  . PNA vac Low Risk Adult (2 of 2 - PCV13) 03/05/2015  . HEMOGLOBIN A1C  12/08/2016  . INFLUENZA VACCINE  Completed   Fall Risk  04/05/2016 12/15/2015 10/07/2015 09/09/2015  Falls in the past year? No No Yes Yes  Number falls in past yr: - - 2 or more 2 or more  Injury with Fall? - - No No  Risk Factor Category  - - - High Fall Risk  Risk for fall due to : - - - History of fall(s)  Follow up - - Education provided;Falls evaluation completed Education provided   Functional Status Survey:    Vitals:   12/21/16 1253  BP: (!) 150/80  Pulse: 96  Resp: 20  Temp: 98.1 F (36.7 C)  TempSrc: Oral  SpO2: 94%  Weight: 210 lb 11.2 oz (95.6 kg)  Height: 5\' 4"  (1.626 m)   Body mass index is 36.17 kg/m. Physical Exam  Constitutional: She is oriented to person, place, and time. Vital signs are  normal. She appears well-developed and well-nourished. She is active and cooperative. She does not appear ill. No distress. Nasal cannula in place.  HENT:  Head: Normocephalic and atraumatic.  Mouth/Throat:  Uvula is midline, oropharynx is clear and moist and mucous membranes are normal. Mucous membranes are not pale, not dry and not cyanotic.  Eyes: Conjunctivae, EOM and lids are normal. Pupils are equal, round, and reactive to light.  Neck: Trachea normal, normal range of motion and full passive range of motion without pain. Neck supple. No JVD present. No tracheal deviation, no edema and no erythema present. No thyromegaly present.  Cardiovascular: Normal rate, regular rhythm, normal heart sounds, intact distal pulses and normal pulses. Exam reveals no gallop, no distant heart sounds and no friction rub.  No murmur heard. Pulses:      Dorsalis pedis pulses are 2+ on the right side, and 2+ on the left side.  No edema  Pulmonary/Chest: Effort normal. No accessory muscle usage. No respiratory distress. She has decreased breath sounds in the right upper field, the left upper field and the left middle field. She has wheezes in the right lower field and the left lower field. She has no rhonchi. She has no rales. She exhibits no tenderness.  Abdominal: Soft. Normal appearance and bowel sounds are normal. She exhibits no distension and no ascites. There is no tenderness.  Musculoskeletal: Normal range of motion. She exhibits no edema or tenderness.  Expected osteoarthritis, stiffness; Bilateral Calves soft, supple. Negative Homan's Sign. B- pedal pulses equal  Neurological: She is alert and oriented to person, place, and time. She has normal strength.  Skin: Skin is warm, dry and intact. She is not diaphoretic. No cyanosis. No pallor. Nails show no clubbing.  Psychiatric: She has a normal mood and affect. Her speech is normal and behavior is normal. Judgment and thought content normal. Cognition and  memory are normal.  Nursing note and vitals reviewed.   Labs reviewed: Recent Labs    02/12/16 1020  06/22/16 0545  09/28/16 0450 09/28/16 1130 11/19/16 1631  NA 119*   < > 126*   < > 129* 133* 131*  K 5.2*   < > 4.5   < > 5.3* 4.6 4.6  CL 81*   < > 87*   < > 89* 87* 85*  CO2 30   < > 31   < > 28 37* 37*  GLUCOSE 123*   < > 86   < > 64* 89 103*  BUN 15   < > 28*   < > 24* 22* 14  CREATININE 0.78   < > 0.57   < > 0.60 0.62 0.47  CALCIUM 8.3*   < > 8.9   < > 8.7* 8.9 8.8*  MG 1.5*  --  1.8  --   --   --   --    < > = values in this interval not displayed.   Recent Labs    08/19/16 0700 09/28/16 1130 11/19/16 1631  AST 16 16 19   ALT 13* 14 13*  ALKPHOS 53 58 50  BILITOT 0.4 0.5 0.6  PROT 5.4* 5.5* 5.6*  ALBUMIN 2.8* 2.3* 2.6*   Recent Labs    08/19/16 0700 09/28/16 1130 11/19/16 1631  WBC 6.5 9.0 7.1  NEUTROABS 3.5 6.4 4.4  HGB 10.6* 10.6* 10.6*  HCT 32.2* 31.9* 32.9*  MCV 81.9 83.7 89.1  PLT 232 273 252   Lab Results  Component Value Date   TSH 2.998 02/12/2016   Lab Results  Component Value Date   HGBA1C 5.6 06/08/2016   Lab Results  Component Value Date   CHOL 106 02/12/2016   HDL 42  02/12/2016   LDLCALC 49 02/12/2016   TRIG 74 02/12/2016   CHOLHDL 2.5 02/12/2016    Significant Diagnostic Results in last 30 days:  No results found.  Assessment/Plan 1.  Acute cystitis without hematuria  Levaquin 500 mg 1 tablet p.o. daily times 5 days  2.  COPD exacerbation  Levaquin 500 mg 1 tablet p.o. daily times 5 days  Solu-Medrol 125 mg IM x1 now  Prednisone taper  40 mg p.o. daily times 3 days, then  20 mg p.o. daily times 3 days, then  10 mg p.o. daily times 3 days  Continue guaifenesin 10 mL p.o. every 4 hours as needed cough   Family/ staff Communication:   Total Time:  Documentation:  Face to Face:  Family/Phone:   Labs/tests ordered: 2 view chest x-ray, UA, C&S  Medication list reviewed and assessed for continued  appropriateness.  Vikki Ports, NP-C Geriatrics [facility] [phone], [place] [address] Cell Phone (Mon-Fri 8am-5pm):  [phone] On Call:  [phone] & follow prompts after 5pm & weekends Office Phone:  [phone] Office Fax:  [phone]

## 2016-12-24 LAB — URINE CULTURE: Culture: 40000 — AB

## 2017-01-03 NOTE — Progress Notes (Signed)
Location:   [facility] Room Number: [phone] Place of Service:  SNF [phone]) Provider:  Toni Arthurs, NP-C  Juluis Pitch, MD  Patient Care Team: Juluis Pitch, MD as PCP - General (Family Medicine) Alisa Graff, FNP as Nurse Practitioner (Family Medicine) Teodoro Spray, MD as Consulting Physician (Cardiology) Flora Lipps, MD as Consulting Physician (Pulmonary Disease)  Extended Emergency Contact Information Primary Emergency Contact: [name],[name] Address: [address]          [place], [place] [address] [name] of [place] Phone: [phone] Mobile Phone: [phone] Relation: Daughter Secondary Emergency Contact: Bruntz,[name] Address: [address]          [place], [place] [address] [name] of [place] Phone: [phone] Relation: Son  Code Status:  FULL Goals of care: Advanced Directive information Advanced Directives 12/21/2016  Does Patient Have a Medical Advance Directive? No  Type of Advance Directive -  Does patient want to make changes to medical advance directive? -  Copy of [place] in Chart? -  Would patient like information on creating a medical advance directive? No - Patient declined     Chief Complaint  Patient presents with  . Medical Management of Chronic Issues    Routine Visit    HPI:  Pt is a 82 y.o. female seen today for medical management of chronic diseases.  Patient has multiple medical comorbidities.  Conditions have been stable in the past month.  Patient is a resident of skilled nursing.  Patient has adapted well.  She does not interact with other residents much, but is pleasant.  She reports she is feeling well at this time.  Patient does complain of intermittent, though frequent, migraines.  She says the pain is in different locations.  Nothing makes them better, but tries Tylenol for the pain.  Reports she has a headache almost every day.  She typically awakens in the  morning with them.  She was initiated on melatonin and Depakote with upward titration recently for migraine prophylaxis.  Patient does report an improvement of symptoms.  Vital signs stable.  No other complaints.   Past Medical History:  Diagnosis Date  . Anxiety    unspecified  . Arthritis   . Breast cancer ([place]) 2007   Early stage left breast cancer status post partial colectomy, 04/2005 (Dr. Oliva Bustard)  . Chest pain, unspecified   . CHF (congestive heart failure) ([place])   . Chronic headache   . Colon cancer ([place])    status post partial colectomy, diagnosed 2000  . Colon neoplasm   . COPD (chronic obstructive pulmonary disease) ([place])   . Depression    unspecified  . Diabetes mellitus   . Fibrocystic breast   . History of breast cancer   . History of colon cancer   . HTN (hypertension) with goal to be determined   . Hyperlipidemia   . Hyperlipidemia, unspecified   . Hypertension   . Obesity, unspecified   . Sleep apnea    mild to moderate   Past Surgical History:  Procedure Laterality Date  . APPENDECTOMY    . BREAST LUMPECTOMY  2007   left  . BUNIONECTOMY Right   . CATARACT EXTRACTION W/ INTRAOCULAR LENS IMPLANT & ANTERIOR VITRECTOMY, BILATERAL Bilateral   . COLON SURGERY  1993  . COLOSTOMY    . DILATION AND CURETTAGE, DIAGNOSTIC / THERAPEUTIC     Fibroid removal  . INSERT REPLACE REMOVE PACEMAKER     ppm  medtronic [id number] Adapta dula chamber rate responsive. 02/21/14  . MASTECTOMY PARTIAL / LUMPECTOMY    . PARTIAL COLECTOMY     Abdominal & Transanal   . TONSILLECTOMY    . TOTAL KNEE ARTHROPLASTY Right 07/03/2013   Right total knee arthroplasty using computer assisted navigation  . TUBAL LIGATION Bilateral 1971   with questionable appendectomy    No Known Allergies  Allergies as of 11/11/2016   No Known Allergies     Medication List        Accurate as of 11/11/16 11:59 PM. Always use your most recent med list.          acetaminophen 325 MG tablet Commonly  known as:  TYLENOL Take 650 mg by mouth every 4 (four) hours as needed for fever. Do not exceed 3000 mg in 24hrs   acetaminophen 500 MG tablet Commonly known as:  TYLENOL Take 1,000 mg by mouth daily. 5 pm   albuterol (2.5 MG/3ML) 0.083% nebulizer solution Commonly known as:  PROVENTIL Take 2.5 mg by nebulization every 6 (six) hours as needed for wheezing or shortness of breath. For wheezing ,sob  per [facility], visit note 04/05/16   alum & mag hydroxide-simeth 876-811-57 MG/5ML suspension Commonly known as:  MAALOX PLUS Take 30 mLs by mouth every 4 (four) hours as needed.   aspirin EC 81 MG tablet Take 81 mg by mouth daily.   COMBIVENT RESPIMAT 20-100 MCG/ACT Aers respimat Generic drug:  Ipratropium-Albuterol Inhale 1 puff into the lungs every 6 (six) hours.   demeclocycline 300 MG tablet Commonly known as:  DECLOMYCIN Take 600 mg 2 (two) times daily by mouth. 2 tabs   divalproex 125 MG capsule Commonly known as:  DEPAKOTE SPRINKLE Take 250 mg by mouth 2 (two) times daily.   docusate sodium 100 MG capsule Commonly known as:  COLACE Take 100 mg by mouth daily.   DULoxetine 60 MG capsule Commonly known as:  CYMBALTA Take 60 mg by mouth daily.   eucerin cream Apply liberal amount topically to dry areas of skin daily after bath and as needed. Ok to keep at bedside   feeding supplement (PRO-STAT SUGAR FREE 64) Liqd Take 30 mLs by mouth 2 (two) times daily between meals.   fluticasone-salmeterol 115-21 MCG/ACT inhaler Commonly known as:  ADVAIR HFA Inhale 2 puffs into the lungs 2 (two) times daily. 8 am and 8 pm   furosemide 20 MG tablet Commonly known as:  LASIX Take 20 mg by mouth daily.   gabapentin 400 MG capsule Commonly known as:  NEURONTIN Take 400 mg by mouth 3 (three) times daily.   glipiZIDE-metformin 2.5-500 MG tablet Commonly known as:  METAGLIP Take 1 tablet by mouth daily.   ibuprofen 600 MG tablet Commonly known as:  ADVIL,MOTRIN Take  600 mg by mouth every 6 (six) hours as needed for headache.   ketotifen 0.025 % ophthalmic solution Commonly known as:  ZADITOR Place 1 drop into both eyes 2 (two) times daily.   LORazepam 0.5 MG tablet Commonly known as:  ATIVAN Take 1 tablet (0.5 mg total) by mouth at bedtime.   magnesium oxide 400 MG tablet Commonly known as:  MAG-OX Take 400 mg by mouth 2 (two) times daily.   Melatonin 10 MG Tabs Take 1 tablet by mouth at bedtime.   meloxicam 7.5 MG tablet Commonly known as:  MOBIC Take 7.5 mg by mouth daily.   Menthol 1 MG Lozg Use as directed 1 lozenge in the mouth or  throat every 6 (six) hours as needed.   nystatin powder Commonly known as:  MYCOSTATIN/NYSTOP Apply 1 application topically to abdominal folds daily for redness and irritation.   OXYGEN Inhale 2 L into the lungs continuous. Titrate to maintain sats > 90 % as needed. Inhale at all times for COPD   pantoprazole 20 MG tablet Commonly known as:  PROTONIX Take 20 mg by mouth daily.   potassium chloride 10 MEQ tablet Commonly known as:  K-DUR,KLOR-CON Take 10 mEq by mouth every other day.   propranolol 20 MG tablet Commonly known as:  INDERAL Take 20 mg by mouth 2 (two) times daily.   SENIOR TABS Tabs Take 1 tablet by mouth daily.   senna-docusate 8.6-50 MG tablet Commonly known as:  Senokot-S Take 1 tablet by mouth 2 (two) times daily.   sucralfate 1 g tablet Commonly known as:  CARAFATE Take 1 g by mouth daily. Tapering dose   trandolapril 2 MG tablet Commonly known as:  MAVIK Take 2 mg by mouth daily.   traZODone 100 MG tablet Commonly known as:  DESYREL Take 1 tablet (100 mg total) by mouth at bedtime.   Vitamin D3 2000 units Tabs Take 1 tablet by mouth daily.       Review of Systems  Constitutional: Negative for activity change, appetite change, chills, diaphoresis and fever.  HENT: Negative for congestion, mouth sores, nosebleeds, postnasal drip, sneezing, sore throat, trouble  swallowing and voice change.   Respiratory: Negative for apnea, cough, choking, chest tightness, shortness of breath and wheezing.   Cardiovascular: Negative for chest pain, palpitations and leg swelling.  Gastrointestinal: Negative for abdominal distention, abdominal pain, constipation, diarrhea and nausea.  Genitourinary: Negative for difficulty urinating, dysuria, frequency and urgency.  Musculoskeletal: Positive for arthralgias (typical arthritis). Negative for back pain, gait problem and myalgias.  Skin: Negative for color change, pallor, rash and wound.  Neurological: Negative for dizziness, tremors, syncope, speech difficulty, weakness, numbness and headaches.  Psychiatric/Behavioral: Negative for agitation and behavioral problems.  All other systems reviewed and are negative.   Immunization History  Administered Date(s) Administered  . Influenza Split 11/08/2013, 11/21/2014  . Influenza, High Dose Seasonal PF 12/05/2012  . Influenza-Unspecified 08/05/2015, 09/22/2016  . PPD Test 03/05/2014, 10/09/2015, 02/10/2016  . Pneumococcal Polysaccharide-23 05/16/2010  . Pneumococcal-Unspecified 03/05/2014   Pertinent  Health Maintenance Due  Topic Date Due  . FOOT EXAM  03/23/1944  . OPHTHALMOLOGY EXAM  03/23/1944  . DEXA SCAN  03/22/2001  . PNA vac Low Risk Adult (2 of 2 - PCV13) 03/05/2015  . HEMOGLOBIN A1C  12/08/2016  . INFLUENZA VACCINE  Completed   Fall Risk  04/05/2016 12/15/2015 10/07/2015 09/09/2015  Falls in the past year? No No Yes Yes  Number falls in past yr: - - 2 or more 2 or more  Injury with Fall? - - No No  Risk Factor Category  - - - High Fall Risk  Risk for fall due to : - - - History of fall(s)  Follow up - - Education provided;Falls evaluation completed Education provided   Functional Status Survey:    Vitals:   11/11/16 0929  BP: (!) 158/80  Pulse: 92  Resp: (!) 24  Temp: 98.7 F (37.1 C)  TempSrc: Oral  SpO2: 96%  Weight: 214 lb 11.2 oz (97.4 kg)    Height: 5' 4" (1.626 m)   Body mass index is 36.85 kg/m. Physical Exam  Constitutional: She is oriented to person, place, and time. Vital signs  are normal. She appears well-developed and well-nourished. She is active and cooperative. She does not appear ill. No distress. Nasal cannula in place.  Morbid obese in no acute distress   HENT:  Head: Normocephalic and atraumatic.  Mouth/Throat: Uvula is midline, oropharynx is clear and moist and mucous membranes are normal. Mucous membranes are not pale, not dry and not cyanotic. No oropharyngeal exudate.  Eyes: Conjunctivae, EOM and lids are normal. Pupils are equal, round, and reactive to light. Right eye exhibits no discharge. Left eye exhibits no discharge. No scleral icterus.  Neck: Trachea normal, normal range of motion and full passive range of motion without pain. Neck supple. No JVD present. No tracheal deviation, no edema and no erythema present. No thyromegaly present.  Cardiovascular: Normal rate, regular rhythm, normal heart sounds, intact distal pulses and normal pulses. Exam reveals no gallop, no distant heart sounds and no friction rub.  No murmur heard. Pulses:      Dorsalis pedis pulses are 2+ on the right side, and 2+ on the left side.  No edema  Pulmonary/Chest: Effort normal and breath sounds normal. No accessory muscle usage. No respiratory distress. She has no decreased breath sounds. She has no wheezes. She has no rhonchi. She has no rales. She exhibits no tenderness.  Oxygen 2 liters [place] in place.   Abdominal: Soft. Normal appearance and bowel sounds are normal. She exhibits no distension and no ascites. There is no tenderness. There is no rebound and no guarding.  Musculoskeletal: Normal range of motion. She exhibits no edema, tenderness or deformity.  Moves x 4 extremities.Unsteady gait   Lymphadenopathy:    She has no cervical adenopathy.  Neurological: She is alert and oriented to person, place, and time. She has normal  strength. Coordination and gait abnormal.  Skin: Skin is warm, dry and intact. No rash noted. She is not diaphoretic. No cyanosis or erythema. No pallor. Nails show no clubbing.  Psychiatric: She has a normal mood and affect. Her speech is normal and behavior is normal. Judgment and thought content normal. Cognition and memory are normal.  Nursing note and vitals reviewed.   Labs reviewed: Recent Labs    02/12/16 1020  06/22/16 0545  09/28/16 0450 09/28/16 1130 11/19/16 1631  NA 119*   < > 126*   < > 129* 133* 131*  K 5.2*   < > 4.5   < > 5.3* 4.6 4.6  CL 81*   < > 87*   < > 89* 87* 85*  CO2 30   < > 31   < > 28 37* 37*  GLUCOSE 123*   < > 86   < > 64* 89 103*  BUN 15   < > 28*   < > 24* 22* 14  CREATININE 0.78   < > 0.57   < > 0.60 0.62 0.47  CALCIUM 8.3*   < > 8.9   < > 8.7* 8.9 8.8*  MG 1.5*  --  1.8  --   --   --   --    < > = values in this interval not displayed.   Recent Labs    08/19/16 0700 09/28/16 1130 11/19/16 1631  AST _0 ALT 13* 14 13*  ALKPHOS 53 58 50  BILITOT 0.4 0.5 0.6  PROT 5.4* 5.5* 5.6*  ALBUMIN 2.8* 2.3* 2.6*   Recent Labs    08/19/16 0700 09/28/16 1130 11/19/16 1631  WBC 6.5 9.0 7.1  NEUTROABS 3.5  6.4 4.4  HGB 10.6* 10.6* 10.6*  HCT 32.2* 31.9* 32.9*  MCV 81.9 83.7 89.1  PLT 232 273 252   Lab Results  Component Value Date   TSH 2.998 02/12/2016   Lab Results  Component Value Date   HGBA1C 5.6 06/08/2016   Lab Results  Component Value Date   CHOL 106 02/12/2016   HDL 42 02/12/2016   LDLCALC 49 02/12/2016   TRIG 74 02/12/2016   CHOLHDL 2.5 02/12/2016    Significant Diagnostic Results in last 30 days:  No results found.  Assessment/Plan 1. Dependence on supplemental oxygen  Continue O2 2L [place]  2. Primary central sleep apnea  CPAP Q HS  3. Dependence on other enabling machines and devices  Cleanse CPAP mask, tubing, chamber water and small amount of dish soap once a week. Dry completely  4. Presence of cardiac  pacemaker  Stable   5. Bradycardia, sinus  Stable   6. Second degree AV block  Stable   7. Single current episode of major depressive disorder, unspecified depression episode severity  Stable   Continue Duloxetine 60 mg po Q Day  8. Anxiety disorder, unspecified type  Stable   Continue Lorazepam 1 mg po Q HS  9. Vitamin D deficiency  Stable  Continue Cholecalciferol 2,000 units po Q Day  10. Insomnia, unspecified type  Stable  Continue Trazodone 100 mg po Q HS  11. Gastroesophageal reflux disease without esophagitis  Stable  Decrease Protonix to 20 mg po Q Day  Decrease Sucralfate to 1 gram BID- tapering dose off  12. Syndrome of inappropriate antidiuretic hormone (HCC)  Continue No sodium restriction diet  Stable   Continue Demeclocycline 600 mg po BID  13. Migraine without status migrainosus, not intractable, unspecified migraine type  Variable   Continue Melatonin 10 mg po Q HS migraine prophylaxis  Continue Depakote Sprinkles 250 mg po BID for headache prophylaxis  Continue Propranolol 20 mg po BID  Continue Acetaminophen 1,000 mg po Q Day  14. Personal history of other malignant neoplasm of large intestine (CODE)  stable  15. Personal history of malignant neoplasm of breast  Stable   16. Mixed hyperlipidemia  Stable  No medications necessary  17. Type 2 diabetes, controlled, with neuropathy (HCC)  Stable  Continue glipizide-metformin tablet; 2.5-500 mg; amt: 1 tablet po Q Day  FSBS BID  Continue Gabapentin 400 mg po TID  Continue Mobic 7.5 mg po Q Day  18. Chronic obstructive pulmonary disease, unspecified COPD type (HCC)  Stable   Continue albuterol sulfate solution for nebulization; 2.5 mg /3 mL (0.083 %) Q 6 hours per instructions from [facility]  Continue Advair Diskus (fluticasone-salmeterol) blister with device; 250-50 mcg/dose 1 puff Q 12 hours  Continue Combivent Respimat 20-100- 1 inhalation  QID  19. Hypertensive heart disease with CHF (congestive heart failure) (HCC)  Stable  Continue Lasix 40 mg po Q Day  Continue trandolapril 2 mg PO Q Day  Continue Aspir-81 (aspirin) [OTC] tablet,delayed release (DR/EC); 81 mg po Q Day  Continue potassium chloride tablet extended release; 10 mEq; amt: 1 tablet po Q Day  20. Nutritional Deficiency  Continue Pro-Stat 30 mL PO Q Day  Continue Multivitamin po Q Day  Continue Magnesium Oxide 400 mg po BID  21. Constipation  Continue Colace 100 mg po Q Day  Continue Senna-S po BID  Milk of Mag. 30 ml po Q day prn  Continue all medications as listed above (11/11/2016)   Family/ staff  Communication:   Total Time:  Documentation:  Face to Face:  Family/Phone:   Labs/tests ordered: CBC, met C  Medication list reviewed and assessed for continued appropriateness. Monthly medication orders reviewed and signed.  Vikki Ports, NP-C Geriatrics [facility] [phone] [place], [place] [address] Cell Phone (Mon-Fri 8am-5pm):  [phone] On Call:  [phone] & follow prompts after 5pm & weekends Office Phone:  [phone] Office Fax:  [phone]

## 2017-01-03 NOTE — Progress Notes (Signed)
Location:   [facility] Room Number: [id number] Place of Service:  ALF [phone]) Provider:  Toni Arthurs, NP-C  Juluis Pitch, MD  Patient Care Team: Juluis Pitch, MD as PCP - General (Family Medicine) Alisa Graff, FNP as Nurse Practitioner (Family Medicine) Teodoro Spray, MD as Consulting Physician (Cardiology) Flora Lipps, MD as Consulting Physician (Pulmonary Disease)  Extended Emergency Contact Information Primary Emergency Contact: [name],[name] Address: [address]          [place], [place] [address] [name] of [place] Phone: [phone] Mobile Phone: [phone] Relation: Daughter Secondary Emergency Contact: Christensen,[name] Address: [address]          [place], [place] [address] [name] of [place] Phone: [phone] Relation: Son  Code Status:  FULL Goals of care: Advanced Directive information Advanced Directives 12/21/2016  Does Patient Have a Medical Advance Directive? No  Type of Advance Directive -  Does patient want to make changes to medical advance directive? -  Copy of [place] in Chart? -  Would patient like information on creating a medical advance directive? No - Patient declined     Chief Complaint  Patient presents with  . Medical Management of Chronic Issues    Routine Visit    HPI:  Pt is a 82 y.o. female seen today for medical management of chronic diseases.  Patient has multiple medical comorbidities.  Conditions have been stable in the past month.  Patient is a resident of skilled nursing.  Patient has adapted well.  She does not interact with other residents much, but is pleasant.  She reports she is feeling well at this time.  Patient does complain of intermittent, though frequent, migraines.  She says the pain is in different locations.  Nothing makes it better, but tries Tylenol for the pain.  Reports she has a headache almost every day.  She typically awakens in the  morning with them.  She was initiated on high dose melatonin and Depakote with upward titration recently for migraine prophylaxis.  Patient does report an improvement of symptoms.  Vital signs stable.  No other complaints.   Past Medical History:  Diagnosis Date  . Anxiety    unspecified  . Arthritis   . Breast cancer ([place]) 2007   Early stage left breast cancer status post partial colectomy, 04/2005 (Dr. Oliva Bustard)  . Chest pain, unspecified   . CHF (congestive heart failure) ([place])   . Chronic headache   . Colon cancer ([place])    status post partial colectomy, diagnosed 2000  . Colon neoplasm   . COPD (chronic obstructive pulmonary disease) ([place])   . Depression    unspecified  . Diabetes mellitus   . Fibrocystic breast   . History of breast cancer   . History of colon cancer   . HTN (hypertension) with goal to be determined   . Hyperlipidemia   . Hyperlipidemia, unspecified   . Hypertension   . Obesity, unspecified   . Sleep apnea    mild to moderate   Past Surgical History:  Procedure Laterality Date  . APPENDECTOMY    . BREAST LUMPECTOMY  2007   left  . BUNIONECTOMY Right   . CATARACT EXTRACTION W/ INTRAOCULAR LENS IMPLANT & ANTERIOR VITRECTOMY, BILATERAL Bilateral   . COLON SURGERY  1993  . COLOSTOMY    . DILATION AND CURETTAGE, DIAGNOSTIC / THERAPEUTIC     Fibroid removal  . INSERT REPLACE REMOVE PACEMAKER  ppm medtronic [id number] Adapta dula chamber rate responsive. 02/21/14  . MASTECTOMY PARTIAL / LUMPECTOMY    . PARTIAL COLECTOMY     Abdominal & Transanal   . TONSILLECTOMY    . TOTAL KNEE ARTHROPLASTY Right 07/03/2013   Right total knee arthroplasty using computer assisted navigation  . TUBAL LIGATION Bilateral 1971   with questionable appendectomy    No Known Allergies  Allergies as of 12/17/2016   No Known Allergies     Medication List        Accurate as of 12/17/16 11:59 PM. Always use your most recent med list.          acetaminophen 325 MG  tablet Commonly known as:  TYLENOL Take 650 mg by mouth every 4 (four) hours as needed for fever. Do not exceed 3000 mg in 24hrs   acetaminophen 500 MG tablet Commonly known as:  TYLENOL Take 1,000 mg by mouth daily. 5 pm   albuterol (2.5 MG/3ML) 0.083% nebulizer solution Commonly known as:  PROVENTIL Take 2.5 mg by nebulization every 6 (six) hours as needed for wheezing or shortness of breath. For wheezing ,sob  per [facility], visit note 04/05/16   alum & mag hydroxide-simeth 024-097-35 MG/5ML suspension Commonly known as:  MAALOX PLUS Take 30 mLs by mouth every 4 (four) hours as needed.   aspirin EC 81 MG tablet Take 81 mg by mouth daily.   COMBIVENT RESPIMAT 20-100 MCG/ACT Aers respimat Generic drug:  Ipratropium-Albuterol Inhale 1 puff into the lungs every 6 (six) hours.   demeclocycline 300 MG tablet Commonly known as:  DECLOMYCIN Take 600 mg 2 (two) times daily by mouth. 2 tabs   divalproex 125 MG capsule Commonly known as:  DEPAKOTE SPRINKLE Take 250 mg by mouth 2 (two) times daily.   docusate sodium 100 MG capsule Commonly known as:  COLACE Take 100 mg by mouth daily.   DULoxetine 60 MG capsule Commonly known as:  CYMBALTA Take 60 mg by mouth daily.   eucerin cream Apply liberal amount topically to dry areas of skin daily after bath and as needed. Ok to keep at bedside   feeding supplement (PRO-STAT SUGAR FREE 64) Liqd Take 30 mLs by mouth 2 (two) times daily between meals.   fluticasone-salmeterol 115-21 MCG/ACT inhaler Commonly known as:  ADVAIR HFA Inhale 2 puffs into the lungs 2 (two) times daily. 8 am and 8 pm   furosemide 20 MG tablet Commonly known as:  LASIX Take 20 mg by mouth daily.   gabapentin 400 MG capsule Commonly known as:  NEURONTIN Take 400 mg by mouth 3 (three) times daily.   glipiZIDE-metformin 2.5-500 MG tablet Commonly known as:  METAGLIP Take 1 tablet by mouth daily.   GLUCERNA Liqd Take 237 mLs by mouth 2 (two)  times daily between meals.   ibuprofen 600 MG tablet Commonly known as:  ADVIL,MOTRIN Take 600 mg by mouth every 6 (six) hours as needed for headache.   ketotifen 0.025 % ophthalmic solution Commonly known as:  ZADITOR Place 1 drop into both eyes 2 (two) times daily.   LORazepam 0.5 MG tablet Commonly known as:  ATIVAN Take 1 tablet (0.5 mg total) by mouth at bedtime.   magnesium oxide 400 MG tablet Commonly known as:  MAG-OX Take 400 mg by mouth 2 (two) times daily.   Melatonin 10 MG Tabs Take 1 tablet by mouth at bedtime.   meloxicam 7.5 MG tablet Commonly known as:  MOBIC Take 7.5 mg by mouth  daily.   Menthol 1 MG Lozg Use as directed 1 lozenge in the mouth or throat every 6 (six) hours as needed.   nystatin powder Commonly known as:  MYCOSTATIN/NYSTOP Apply 1 application topically to abdominal folds daily for redness and irritation.   OXYGEN Inhale 2 L into the lungs continuous. Titrate to maintain sats > 90 % as needed. Inhale at all times for COPD   pantoprazole 20 MG tablet Commonly known as:  PROTONIX Take 20 mg by mouth daily.   potassium chloride 10 MEQ tablet Commonly known as:  K-DUR,KLOR-CON Take 10 mEq by mouth every other day.   SENIOR TABS Tabs Take 1 tablet by mouth daily.   senna-docusate 8.6-50 MG tablet Commonly known as:  Senokot-S Take 1 tablet by mouth 2 (two) times daily.   sucralfate 1 g tablet Commonly known as:  CARAFATE Take 1 g by mouth daily. Tapering dose   trandolapril 2 MG tablet Commonly known as:  MAVIK Take 2 mg by mouth daily.   traZODone 100 MG tablet Commonly known as:  DESYREL Take 1 tablet (100 mg total) by mouth at bedtime.   Vitamin D3 2000 units Tabs Take 1 tablet by mouth daily.       Review of Systems  Constitutional: Negative for activity change, appetite change, chills, diaphoresis and fever.  HENT: Negative for congestion, mouth sores, nosebleeds, postnasal drip, sneezing, sore throat, trouble  swallowing and voice change.   Respiratory: Negative for apnea, cough, choking, chest tightness, shortness of breath and wheezing.   Cardiovascular: Negative for chest pain, palpitations and leg swelling.  Gastrointestinal: Negative for abdominal distention, abdominal pain, constipation, diarrhea and nausea.  Genitourinary: Negative for difficulty urinating, dysuria, frequency and urgency.  Musculoskeletal: Positive for arthralgias (typical arthritis). Negative for back pain, gait problem and myalgias.  Skin: Negative for color change, pallor, rash and wound.  Neurological: Positive for headaches. Negative for dizziness, tremors, syncope, speech difficulty, weakness and numbness.  Psychiatric/Behavioral: Negative for agitation and behavioral problems.  All other systems reviewed and are negative.   Immunization History  Administered Date(s) Administered  . Influenza Split 11/08/2013, 11/21/2014  . Influenza, High Dose Seasonal PF 12/05/2012  . Influenza-Unspecified 08/05/2015, 09/22/2016  . PPD Test 03/05/2014, 10/09/2015, 02/10/2016  . Pneumococcal Polysaccharide-23 05/16/2010  . Pneumococcal-Unspecified 03/05/2014   Pertinent  Health Maintenance Due  Topic Date Due  . FOOT EXAM  03/23/1944  . OPHTHALMOLOGY EXAM  03/23/1944  . DEXA SCAN  03/22/2001  . PNA vac Low Risk Adult (2 of 2 - PCV13) 03/05/2015  . HEMOGLOBIN A1C  12/08/2016  . INFLUENZA VACCINE  Completed   Fall Risk  04/05/2016 12/15/2015 10/07/2015 09/09/2015  Falls in the past year? No No Yes Yes  Number falls in past yr: - - 2 or more 2 or more  Injury with Fall? - - No No  Risk Factor Category  - - - High Fall Risk  Risk for fall due to : - - - History of fall(s)  Follow up - - Education provided;Falls evaluation completed Education provided   Functional Status Survey:    Vitals:   12/17/16 0940  BP: (!) 156/75  Pulse: 96  Resp: 18  Temp: 98.2 F (36.8 C)  TempSrc: Oral  SpO2: 93%  Weight: 214 lb 12.8 oz  (97.4 kg)  Height: 5\' 4"  (1.626 m)   Body mass index is 36.87 kg/m. Physical Exam  Constitutional: She is oriented to person, place, and time. Vital signs are normal. She appears  well-developed and well-nourished. She is active and cooperative. She does not appear ill. No distress. Nasal cannula in place.  Morbid obese in no acute distress   HENT:  Head: Normocephalic and atraumatic.  Mouth/Throat: Uvula is midline, oropharynx is clear and moist and mucous membranes are normal. Mucous membranes are not pale, not dry and not cyanotic. No oropharyngeal exudate.  Eyes: Conjunctivae, EOM and lids are normal. Pupils are equal, round, and reactive to light. Right eye exhibits no discharge. Left eye exhibits no discharge. No scleral icterus.  Neck: Trachea normal, normal range of motion and full passive range of motion without pain. Neck supple. No JVD present. No tracheal deviation, no edema and no erythema present. No thyromegaly present.  Cardiovascular: Normal rate, regular rhythm, normal heart sounds, intact distal pulses and normal pulses. Exam reveals no gallop, no distant heart sounds and no friction rub.  No murmur heard. Pulses:      Dorsalis pedis pulses are 2+ on the right side, and 2+ on the left side.  No edema  Pulmonary/Chest: Effort normal and breath sounds normal. No accessory muscle usage. No respiratory distress. She has no decreased breath sounds. She has no wheezes. She has no rhonchi. She has no rales. She exhibits no tenderness.  Oxygen 2 liters [place] in place.   Abdominal: Soft. Normal appearance and bowel sounds are normal. She exhibits no distension and no ascites. There is no tenderness. There is no rebound and no guarding.  Musculoskeletal: Normal range of motion. She exhibits no edema, tenderness or deformity.  Moves x 4 extremities.Unsteady gait   Lymphadenopathy:    She has no cervical adenopathy.  Neurological: She is alert and oriented to person, place, and time. She  has normal strength. Coordination and gait abnormal.  Skin: Skin is warm, dry and intact. No rash noted. She is not diaphoretic. No cyanosis or erythema. No pallor. Nails show no clubbing.  Psychiatric: She has a normal mood and affect. Her speech is normal and behavior is normal. Judgment and thought content normal. Cognition and memory are normal.  Nursing note and vitals reviewed.   Labs reviewed: Recent Labs    02/12/16 1020  06/22/16 0545  09/28/16 0450 09/28/16 1130 11/19/16 1631  NA 119*   < > 126*   < > 129* 133* 131*  K 5.2*   < > 4.5   < > 5.3* 4.6 4.6  CL 81*   < > 87*   < > 89* 87* 85*  CO2 30   < > 31   < > 28 37* 37*  GLUCOSE 123*   < > 86   < > 64* 89 103*  BUN 15   < > 28*   < > 24* 22* 14  CREATININE 0.78   < > 0.57   < > 0.60 0.62 0.47  CALCIUM 8.3*   < > 8.9   < > 8.7* 8.9 8.8*  MG 1.5*  --  1.8  --   --   --   --    < > = values in this interval not displayed.   Recent Labs    08/19/16 0700 09/28/16 1130 11/19/16 1631  AST 16 16 19   ALT 13* 14 13*  ALKPHOS 53 58 50  BILITOT 0.4 0.5 0.6  PROT 5.4* 5.5* 5.6*  ALBUMIN 2.8* 2.3* 2.6*   Recent Labs    08/19/16 0700 09/28/16 1130 11/19/16 1631  WBC 6.5 9.0 7.1  NEUTROABS 3.5 6.4 4.4  HGB  10.6* 10.6* 10.6*  HCT 32.2* 31.9* 32.9*  MCV 81.9 83.7 89.1  PLT 232 273 252   Lab Results  Component Value Date   TSH 2.998 02/12/2016   Lab Results  Component Value Date   HGBA1C 5.6 06/08/2016   Lab Results  Component Value Date   CHOL 106 02/12/2016   HDL 42 02/12/2016   LDLCALC 49 02/12/2016   TRIG 74 02/12/2016   CHOLHDL 2.5 02/12/2016    Significant Diagnostic Results in last 30 days:  No results found.  Assessment/Plan 1. Dependence on supplemental oxygen  Continue O2 2L [place]  2. Primary central sleep apnea  CPAP Q HS  3. Dependence on other enabling machines and devices  Cleanse CPAP mask, tubing, chamber water and small amount of dish soap once a week. Dry completely  4. Presence  of cardiac pacemaker  Stable   5. Bradycardia, sinus  Stable   6. Second degree AV block  Stable   7. Single current episode of major depressive disorder, unspecified depression episode severity  Stable   Continue Duloxetine 60 mg po Q Day  8. Anxiety disorder, unspecified type  Stable   Continue Lorazepam 1 mg po Q HS  9. Vitamin D deficiency  Stable  Continue Cholecalciferol 2,000 units po Q Day  10. Insomnia, unspecified type  Stable  Continue Trazodone 100 mg po Q HS  11. Gastroesophageal reflux disease without esophagitis  Stable  Decrease Protonix to 20 mg po Q Day  Decrease Sucralfate to 1 gram daily- tapering dose off  12. Syndrome of inappropriate antidiuretic hormone (HCC)  Continue No sodium restriction diet  Stable   Continue Demeclocycline 600 mg po BID  13. Migraine without status migrainosus, not intractable, unspecified migraine type  Variable   Continue Melatonin 10 mg po Q HS migraine prophylaxis  Continue Depakote Sprinkles 250 mg po BID for headache prophylaxis  Continue Propranolol 20 mg po BID  Continue Acetaminophen 1,000 mg po Q Day  14. Personal history of other malignant neoplasm of large intestine (CODE)  stable  15. Personal history of malignant neoplasm of breast  Stable   16. Mixed hyperlipidemia  Stable  No medications necessary  17. Type 2 diabetes, controlled, with neuropathy (HCC)  Stable  Continue glipizide-metformin tablet; 2.5-500 mg; amt: 1 tablet po Q Day  FSBS BID  Continue Gabapentin 400 mg po TID  Continue Mobic 7.5 mg po Q Day  18. Chronic obstructive pulmonary disease, unspecified COPD type (HCC)  Stable   Continue albuterol sulfate solution for nebulization; 2.5 mg /3 mL (0.083 %) Q 6 hours per instructions from [facility]  Continue Advair Diskus (fluticasone-salmeterol) blister with device; 250-50 mcg/dose 1 puff Q 12 hours  Continue Combivent Respimat 20-100- 1  inhalation QID  19. Hypertensive heart disease with CHF (congestive heart failure) (HCC)  Stable  Continue Lasix 40 mg po Q Day  Continue trandolapril 2 mg PO Q Day  Continue Aspir-81 (aspirin) [OTC] tablet,delayed release (DR/EC); 81 mg po Q Day  Continue potassium chloride tablet extended release; 10 mEq; amt: 1 tablet po Q Day  20. Nutritional Deficiency  Continue Pro-Stat 30 mL PO twice daily  Continue Multivitamin po Q Day  Continue Magnesium Oxide 400 mg po BID  Glucerna 1 bottle twice daily between meals  21. Constipation  Continue Colace 100 mg po twice daily  Continue Senna-S po BID  Milk of Mag. 30 ml po Q day prn  Continue all medications as listed above (12/17/2016)  Family/ staff Communication:   Total Time:  Documentation:  Face to Face:  Family/Phone:   Labs/tests ordered: Not due  Medication list reviewed and assessed for continued appropriateness. Monthly medication orders reviewed and signed.  Vikki Ports, NP-C Geriatrics [facility] [id number], [place] [address] Cell Phone (Mon-Fri 8am-5pm):  [phone] On Call:  [phone] & follow prompts after 5pm & weekends Office Phone:  [phone] Office Fax:  [phone]

## 2017-01-04 ENCOUNTER — Encounter
Admission: RE | Admit: 2017-01-04 | Discharge: 2017-01-04 | Disposition: A | Discharge status: Home / Self Care | Payer: Medicare Other | Source: Ambulatory Visit | Attending: Internal Medicine | Admitting: Internal Medicine

## 2017-01-12 IMAGING — DX DG CHEST 1V PORT
1 series · 1 of 1 positions shown · non-contrast
Comparison: Prior chest x-ray 08/20/2015

CLINICAL DATA: 81-year-old female with urinary retention and COPD
exacerbation

EXAM:
PORTABLE CHEST 1 VIEW

[chest ap]
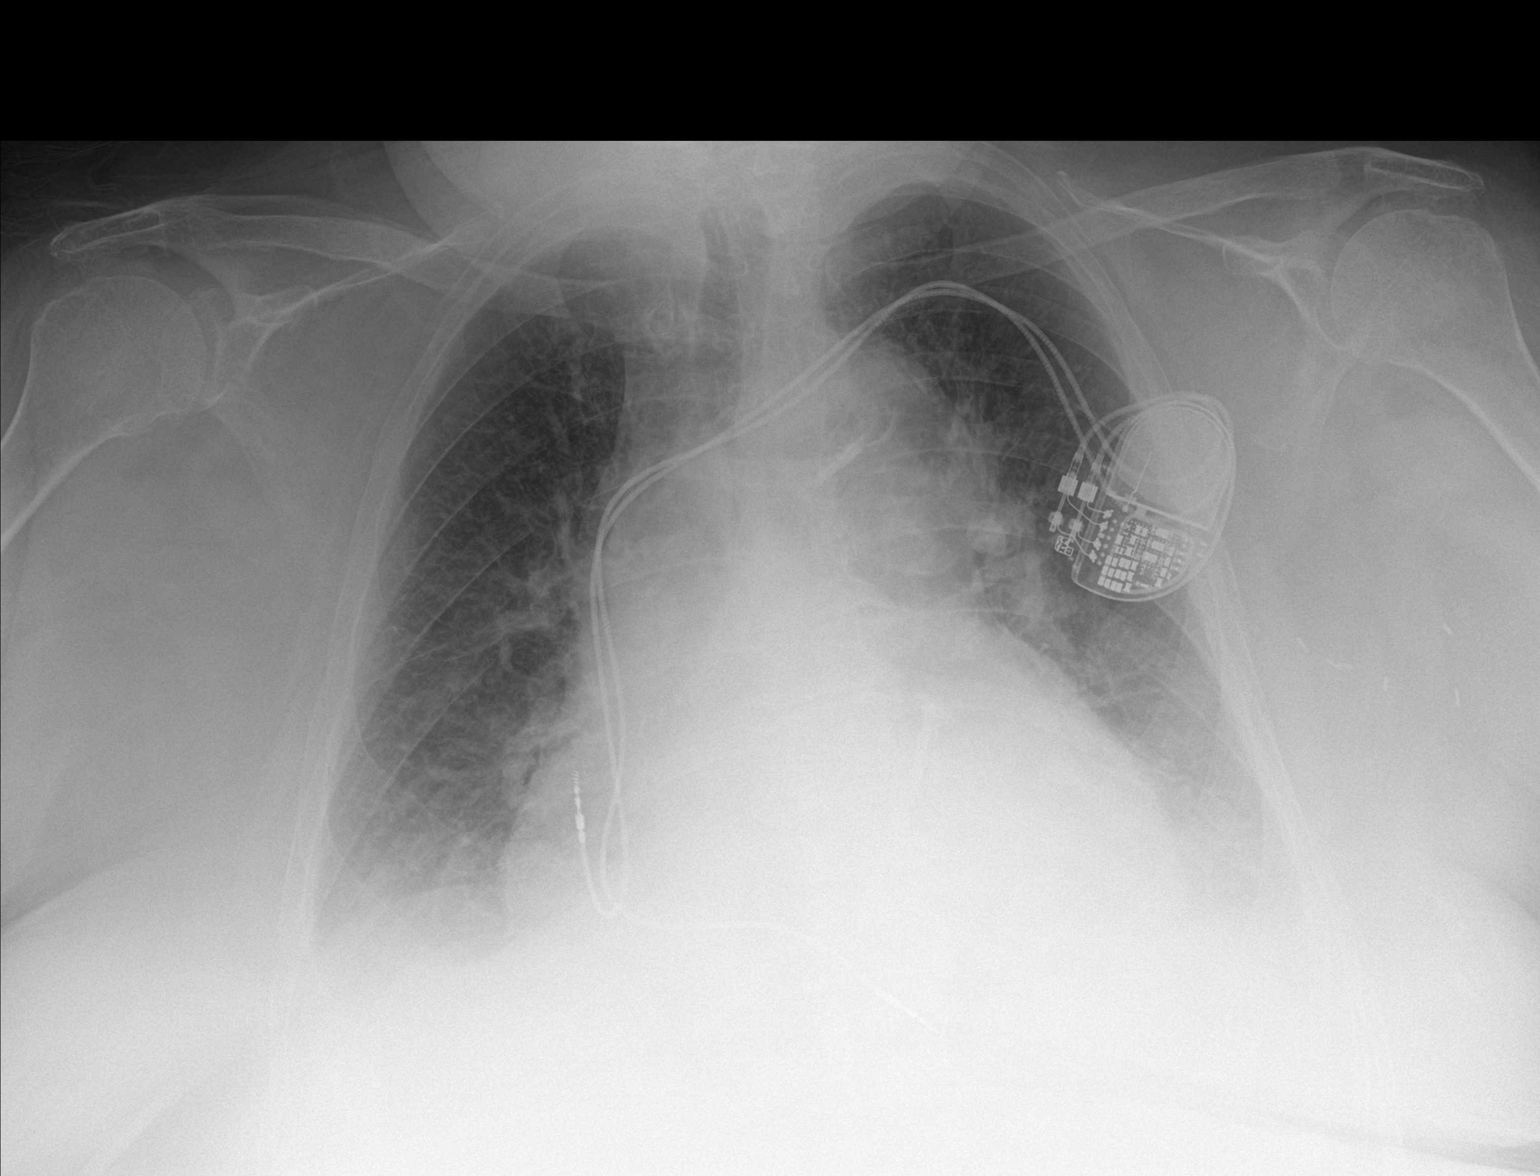

[1 of 1 positions shown; findings below may reference images not displayed]

FINDINGS: Stable cardiomegaly and mediastinal contours. Atherosclerotic
calcifications throughout the aorta. Left subclavian approach
cardiac rhythm maintenance device with leads in stable position
overlying the right atrium and ventricle. Mild pulmonary vascular
congestion without overt edema. Small bilateral layering effusions
and associated atelectasis appear stable.
IMPRESSION: 1. Stable cardiomegaly and pulmonary vascular congestion without
overt edema.
2. Small bilateral pleural effusions and associated bibasilar
atelectasis.
3. Aortic Atherosclerosis (SRCXY-170.0)

## 2017-01-13 IMAGING — DX DG CHEST 1V
1 series · 1 of 1 positions shown · non-contrast
Comparison: 09/21/2015 and earlier.

CLINICAL DATA: 81-year-old female with congestive heart failure.
Initial encounter.

EXAM:
CHEST 1 VIEW

[chest ap]
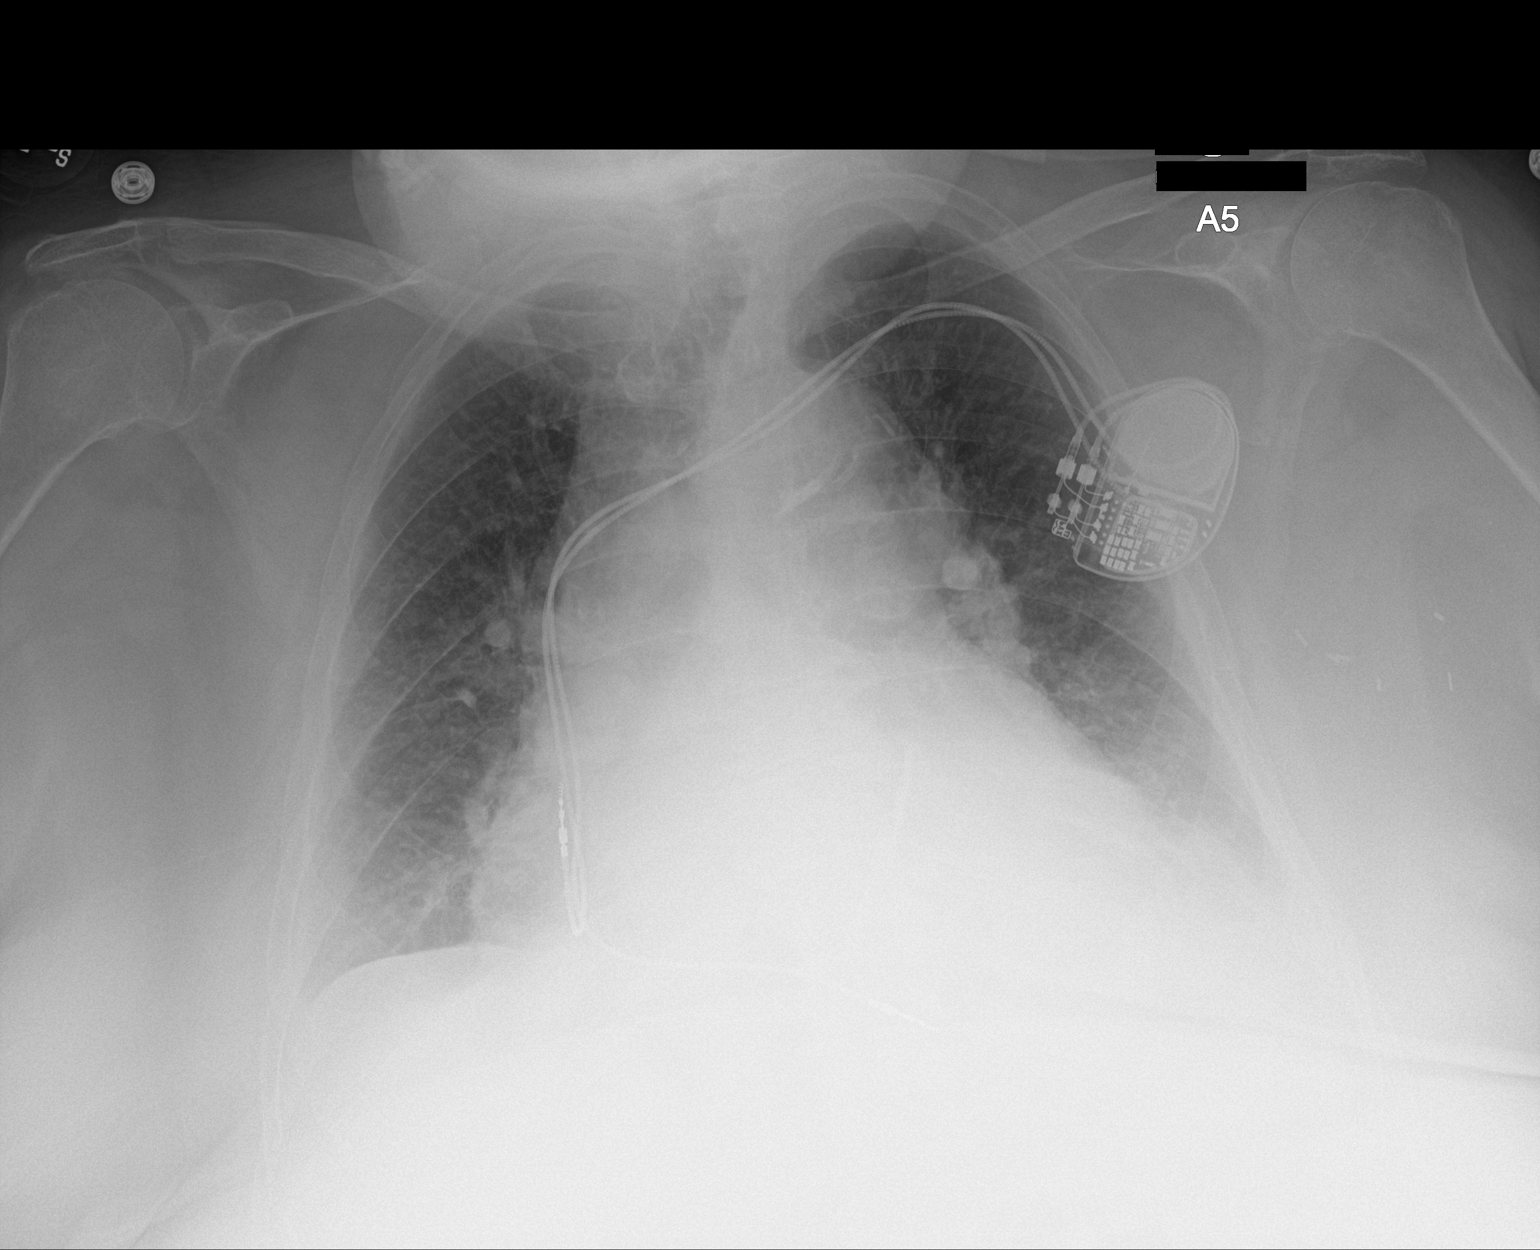

[1 of 1 positions shown; findings below may reference images not displayed]

FINDINGS: Portable AP upright view at 5454 hours. Stable cardiomegaly and
mediastinal contours. Left chest cardiac pacemaker re -
demonstrated. Stable left chest wall surgical clips. Stable
pulmonary vascularity. Mild retrocardiac atelectasis appears stable.
No definite pleural effusions. No pneumothorax. Calcified aortic
atherosclerosis.
IMPRESSION: Stable chest with cardiomegaly, vascular congestion and mild
retrocardiac atelectasis. No overt edema.

## 2017-01-20 ENCOUNTER — Non-Acute Institutional Stay (SKILLED_NURSING_FACILITY): Payer: Medicare Other | Admitting: Gerontology

## 2017-01-20 ENCOUNTER — Encounter: Payer: Self-pay | Admitting: Gerontology

## 2017-01-20 DIAGNOSIS — R05 Cough: Secondary | ICD-10-CM

## 2017-01-20 DIAGNOSIS — R059 Cough, unspecified: Secondary | ICD-10-CM

## 2017-01-20 NOTE — Progress Notes (Signed)
Location:   [facility] Room Number: [phone] Place of Service:  [phone]) Provider:  Toni Arthurs, NP-C  Juluis Pitch, MD  Patient Care Team: Juluis Pitch, MD as PCP - General (Family Medicine) Alisa Graff, FNP as Nurse Practitioner (Family Medicine) Teodoro Spray, MD as Consulting Physician (Cardiology) Flora Lipps, MD as Consulting Physician (Pulmonary Disease)  Extended Emergency Contact Information Primary Emergency Contact: [name],[name] Address: [address]          [place], [place] [address] [name] of [place] Phone: [phone] Mobile Phone: [phone] Relation: Daughter Secondary Emergency Contact: Trego,[name] Address: [address]          [place], [place] [address] [name] of [place] Phone: [phone] Relation: Son  Code Status:  FULL Goals of care: Advanced Directive information Advanced Directives 12/21/2016  Does Patient Have a Medical Advance Directive? No  Type of Advance Directive -  Does patient want to make changes to medical advance directive? -  Copy of [place] in Chart? -  Would patient like information on creating a medical advance directive? No - Patient declined     Chief Complaint  Patient presents with  . Acute Visit    Recheck patient's cough    HPI:  Pt is a 83 y.o. female seen today for an acute visit for cough.  Staff report patient has been coughing for several days.  Patient reports that she is no longer coughing and feels fine.  Cough was nonproductive.  Afebrile.  Patient denies chest pain or shortness of breath.  Symptoms have resolved.  Vital signs stable.  No other complaints.   Past Medical History:  Diagnosis Date  . Anxiety    unspecified  . Arthritis   . Breast cancer ([facility]) 2007   Early stage left breast cancer status post partial colectomy, 04/2005 (Dr. Oliva Bustard)  . Chest pain, unspecified   . CHF (congestive heart failure) ([place])   .  Chronic headache   . Colon cancer ([place])    status post partial colectomy, diagnosed 2000  . Colon neoplasm   . COPD (chronic obstructive pulmonary disease) ([facility])   . Depression    unspecified  . Diabetes mellitus   . Fibrocystic breast   . History of breast cancer   . History of colon cancer   . HTN (hypertension) with goal to be determined   . Hyperlipidemia   . Hyperlipidemia, unspecified   . Hypertension   . Obesity, unspecified   . Sleep apnea    mild to moderate   Past Surgical History:  Procedure Laterality Date  . APPENDECTOMY    . BREAST LUMPECTOMY  2007   left  . BUNIONECTOMY Right   . CATARACT EXTRACTION W/ INTRAOCULAR LENS IMPLANT & ANTERIOR VITRECTOMY, BILATERAL Bilateral   . COLON SURGERY  1993  . COLOSTOMY    . DILATION AND CURETTAGE, DIAGNOSTIC / THERAPEUTIC     Fibroid removal  . INSERT REPLACE REMOVE PACEMAKER     ppm medtronic [id number] Adapta dula chamber rate responsive. 02/21/14  . MASTECTOMY PARTIAL / LUMPECTOMY    . PARTIAL COLECTOMY     Abdominal & Transanal   . TONSILLECTOMY    . TOTAL KNEE ARTHROPLASTY Right 07/03/2013   Right total knee arthroplasty using computer assisted navigation  . TUBAL LIGATION Bilateral 1971   with questionable appendectomy    No Known Allergies  Allergies as of 01/20/2017   No Known Allergies  Medication List        Accurate as of 01/20/17  3:10 PM. Always use your most recent med list.          acetaminophen 325 MG tablet Commonly known as:  TYLENOL Take 650 mg by mouth every 4 (four) hours as needed for fever. Do not exceed 3000 mg in 24hrs   acetaminophen 500 MG tablet Commonly known as:  TYLENOL Take 1,000 mg by mouth daily. 5 pm   albuterol (2.5 MG/3ML) 0.083% nebulizer solution Commonly known as:  PROVENTIL Take 2.5 mg by nebulization every 6 (six) hours as needed for wheezing or shortness of breath. For wheezing ,sob  per [facility], visit note 04/05/16   alum & mag hydroxide-simeth  323-557-32 MG/5ML suspension Commonly known as:  MAALOX PLUS Take 30 mLs by mouth every 4 (four) hours as needed.   aspirin EC 81 MG tablet Take 81 mg by mouth daily.   COMBIVENT RESPIMAT 20-100 MCG/ACT Aers respimat Generic drug:  Ipratropium-Albuterol Inhale 1 puff into the lungs every 6 (six) hours.   demeclocycline 300 MG tablet Commonly known as:  DECLOMYCIN Take 600 mg 2 (two) times daily by mouth. 2 tabs   divalproex 125 MG capsule Commonly known as:  DEPAKOTE SPRINKLE Take 250 mg by mouth 2 (two) times daily.   docusate sodium 100 MG capsule Commonly known as:  COLACE Take 100 mg by mouth daily.   DULoxetine 60 MG capsule Commonly known as:  CYMBALTA Take 60 mg by mouth daily.   eucerin cream Apply liberal amount topically to dry areas of skin daily after bath and as needed. Ok to keep at bedside   feeding supplement (PRO-STAT SUGAR FREE 64) Liqd Take 30 mLs by mouth 2 (two) times daily between meals.   fluticasone-salmeterol 115-21 MCG/ACT inhaler Commonly known as:  ADVAIR HFA Inhale 2 puffs into the lungs 2 (two) times daily. 8 am and 8 pm   furosemide 20 MG tablet Commonly known as:  LASIX Take 20 mg by mouth daily.   gabapentin 400 MG capsule Commonly known as:  NEURONTIN Take 400 mg by mouth 3 (three) times daily.   glipiZIDE-metformin 2.5-500 MG tablet Commonly known as:  METAGLIP Take 1 tablet by mouth daily.   GLUCERNA Liqd Take 237 mLs by mouth 2 (two) times daily between meals.   ibuprofen 600 MG tablet Commonly known as:  ADVIL,MOTRIN Take 600 mg by mouth every 6 (six) hours as needed for headache.   ketotifen 0.025 % ophthalmic solution Commonly known as:  ZADITOR Place 1 drop into both eyes 2 (two) times daily.   LORazepam 0.5 MG tablet Commonly known as:  ATIVAN Take 1 tablet (0.5 mg total) by mouth at bedtime.   magnesium oxide 400 MG tablet Commonly known as:  MAG-OX Take 400 mg by mouth 2 (two) times daily.   Melatonin 10  MG Tabs Take 1 tablet by mouth at bedtime.   meloxicam 7.5 MG tablet Commonly known as:  MOBIC Take 7.5 mg by mouth daily.   Menthol 1 MG Lozg Use as directed 1 lozenge in the mouth or throat every 6 (six) hours as needed.   nystatin powder Commonly known as:  MYCOSTATIN/NYSTOP Apply 1 application topically to abdominal folds daily for redness and irritation.   OXYGEN Inhale 2 L into the lungs continuous. Titrate to maintain sats > 90 % as needed. Inhale at all times for COPD   pantoprazole 20 MG tablet Commonly known as:  PROTONIX Take  20 mg by mouth daily.   potassium chloride 10 MEQ tablet Commonly known as:  K-DUR,KLOR-CON Take 10 mEq by mouth every other day.   pravastatin 10 MG tablet Commonly known as:  PRAVACHOL Take 10 mg by mouth once a week. On sunday   SENIOR TABS Tabs Take 1 tablet by mouth daily.   senna-docusate 8.6-50 MG tablet Commonly known as:  Senokot-S Take 1 tablet by mouth 2 (two) times daily.   sucralfate 1 g tablet Commonly known as:  CARAFATE Take 1 g by mouth daily. Tapering dose   trandolapril 2 MG tablet Commonly known as:  MAVIK Take 2 mg by mouth daily.   traZODone 100 MG tablet Commonly known as:  DESYREL Take 1 tablet (100 mg total) by mouth at bedtime.   Vitamin D3 2000 units Tabs Take 1 tablet by mouth daily.       Review of Systems  Constitutional: Negative for activity change, appetite change, chills, diaphoresis and fever.  HENT: Negative for congestion, mouth sores, nosebleeds, postnasal drip, sneezing, sore throat, trouble swallowing and voice change.   Respiratory: Positive for cough. Negative for apnea, choking, chest tightness, shortness of breath and wheezing.   Cardiovascular: Negative for chest pain, palpitations and leg swelling.  Musculoskeletal: Arthralgias: typical arthritis.  All other systems reviewed and are negative.   Immunization History  Administered Date(s) Administered  . Influenza Split  11/08/2013, 11/21/2014  . Influenza, High Dose Seasonal PF 12/05/2012  . Influenza-Unspecified 08/05/2015, 09/22/2016  . PPD Test 03/05/2014, 10/09/2015, 02/10/2016  . Pneumococcal Polysaccharide-23 05/16/2010  . Pneumococcal-Unspecified 03/05/2014   Pertinent  Health Maintenance Due  Topic Date Due  . FOOT EXAM  03/23/1944  . OPHTHALMOLOGY EXAM  03/23/1944  . DEXA SCAN  03/22/2001  . PNA vac Low Risk Adult (2 of 2 - PCV13) 03/05/2015  . HEMOGLOBIN A1C  12/08/2016  . INFLUENZA VACCINE  Completed   Fall Risk  04/05/2016 12/15/2015 10/07/2015 09/09/2015  Falls in the past year? No No Yes Yes  Number falls in past yr: - - 2 or more 2 or more  Injury with Fall? - - No No  Risk Factor Category  - - - High Fall Risk  Risk for fall due to : - - - History of fall(s)  Follow up - - Education provided;Falls evaluation completed Education provided   Functional Status Survey:    Vitals:   01/20/17 1501  BP: 103/64  Pulse: 98  Resp: 20  Temp: 97.9 F (36.6 C)  TempSrc: Oral  SpO2: 97%  Weight: 217 lb 9.6 oz (98.7 kg)  Height: 5\' 4"  (1.626 m)   Body mass index is 37.35 kg/m. Physical Exam  Constitutional: She is oriented to person, place, and time. Vital signs are normal. She appears well-developed and well-nourished. She is active and cooperative. She does not appear ill. No distress. Nasal cannula in place.  HENT:  Head: Normocephalic and atraumatic.  Mouth/Throat: Uvula is midline, oropharynx is clear and moist and mucous membranes are normal. Mucous membranes are not pale, not dry and not cyanotic.  Eyes: Conjunctivae, EOM and lids are normal. Pupils are equal, round, and reactive to light.  Neck: Trachea normal, normal range of motion and full passive range of motion without pain. Neck supple. No JVD present. No tracheal deviation, no edema and no erythema present. No thyromegaly present.  Cardiovascular: Normal rate, regular rhythm, normal heart sounds, intact distal pulses and  normal pulses. Exam reveals no gallop, no distant heart sounds and  no friction rub.  No murmur heard. Pulses:      Dorsalis pedis pulses are 2+ on the right side, and 2+ on the left side.  No edema  Pulmonary/Chest: Effort normal. No accessory muscle usage. No respiratory distress. She has decreased breath sounds in the right lower field and the left lower field. She has no wheezes. She has no rhonchi. She has no rales. She exhibits no tenderness.  Abdominal: Soft. Normal appearance and bowel sounds are normal. She exhibits no distension and no ascites. There is no tenderness.  Musculoskeletal: Normal range of motion. She exhibits no edema or tenderness.  Expected osteoarthritis, stiffness; Bilateral Calves soft, supple. Negative Homan's Sign. B- pedal pulses equal  Neurological: She is alert and oriented to person, place, and time. She has normal strength.  Skin: Skin is warm, dry and intact. She is not diaphoretic. No cyanosis. No pallor. Nails show no clubbing.  Psychiatric: She has a normal mood and affect. Her speech is normal and behavior is normal. Judgment and thought content normal. Cognition and memory are normal.  Nursing note and vitals reviewed.   Labs reviewed: Recent Labs    02/12/16 1020  06/22/16 0545  09/28/16 0450 09/28/16 1130 11/19/16 1631  NA 119*   < > 126*   < > 129* 133* 131*  K 5.2*   < > 4.5   < > 5.3* 4.6 4.6  CL 81*   < > 87*   < > 89* 87* 85*  CO2 30   < > 31   < > 28 37* 37*  GLUCOSE 123*   < > 86   < > 64* 89 103*  BUN 15   < > 28*   < > 24* 22* 14  CREATININE 0.78   < > 0.57   < > 0.60 0.62 0.47  CALCIUM 8.3*   < > 8.9   < > 8.7* 8.9 8.8*  MG 1.5*  --  1.8  --   --   --   --    < > = values in this interval not displayed.   Recent Labs    08/19/16 0700 09/28/16 1130 11/19/16 1631  AST 16 16 19   ALT 13* 14 13*  ALKPHOS 53 58 50  BILITOT 0.4 0.5 0.6  PROT 5.4* 5.5* 5.6*  ALBUMIN 2.8* 2.3* 2.6*   Recent Labs    08/19/16 0700 09/28/16 1130  11/19/16 1631  WBC 6.5 9.0 7.1  NEUTROABS 3.5 6.4 4.4  HGB 10.6* 10.6* 10.6*  HCT 32.2* 31.9* 32.9*  MCV 81.9 83.7 89.1  PLT 232 273 252   Lab Results  Component Value Date   TSH 2.998 02/12/2016   Lab Results  Component Value Date   HGBA1C 5.6 06/08/2016   Lab Results  Component Value Date   CHOL 106 02/12/2016   HDL 42 02/12/2016   LDLCALC 49 02/12/2016   TRIG 74 02/12/2016   CHOLHDL 2.5 02/12/2016    Significant Diagnostic Results in last 30 days:  No results found.  Assessment/Plan Cough  Guaifenesin 10 mL p.o. every 4 hours as needed cough  Cough drops 1 lozenge every hour as needed  Notify provider if symptoms worsen  Family/ staff Communication:   Total Time:  Documentation:  Face to Face:  Family/Phone:   Labs/tests ordered:    Medication list reviewed and assessed for continued appropriateness.  Vikki Ports, NP-C Geriatrics [facility] [phone] [place], [place]  [address] Cell Phone (Mon-Fri 8am-5pm):  [phone] On Call:  [phone] & follow prompts after 5pm & weekends Office Phone:  [phone] Office Fax:  [phone]

## 2017-01-26 ENCOUNTER — Other Ambulatory Visit: Payer: Self-pay

## 2017-01-26 ENCOUNTER — Non-Acute Institutional Stay (SKILLED_NURSING_FACILITY): Payer: Medicare Other | Admitting: Gerontology

## 2017-01-26 ENCOUNTER — Encounter: Payer: Self-pay | Admitting: Gerontology

## 2017-01-26 DIAGNOSIS — R059 Cough, unspecified: Secondary | ICD-10-CM

## 2017-01-26 DIAGNOSIS — G43909 Migraine, unspecified, not intractable, without status migrainosus: Secondary | ICD-10-CM

## 2017-01-26 DIAGNOSIS — I110 Hypertensive heart disease with heart failure: Secondary | ICD-10-CM

## 2017-01-26 DIAGNOSIS — I441 Atrioventricular block, second degree: Secondary | ICD-10-CM

## 2017-01-26 DIAGNOSIS — R05 Cough: Secondary | ICD-10-CM

## 2017-01-26 MED ORDER — LORAZEPAM 0.5 MG PO TABS: 0.5000 mg | ORAL_TABLET | Freq: Every day | ORAL | 3 refills | Status: DC

## 2017-01-26 NOTE — Telephone Encounter (Signed)
Rx sent to [facility] phone : [phone] , fax : [phone]  

## 2017-01-27 NOTE — Progress Notes (Signed)
Location:    Nursing Home Room Number: 858I Place of Service:  SNF (31) Provider:  Toni Arthurs, NP-C  Juluis Pitch, MD  Patient Care Team: Juluis Pitch, MD as PCP - General (Family Medicine) Alisa Graff, FNP as Nurse Practitioner (Family Medicine) Ubaldo Glassing Javier Docker, MD as Consulting Physician (Cardiology) Flora Lipps, MD as Consulting Physician (Pulmonary Disease)  Extended Emergency Contact Information Primary Emergency Contact: [name],[name] Address: [place], [place] [address] [name] of [place] Phone: [phone] Mobile Phone: [phone] Relation: Daughter Secondary Emergency Contact: Jacquot,[name] Address: [address]          [place], [place] [address] [name] of [place] Phone: [phone] Relation: Son  Code Status:  full Goals of care: Advanced Directive information Advanced Directives 01/26/2017  Does Patient Have a Medical Advance Directive? No  Type of Advance Directive -  Does patient want to make changes to medical advance directive? -  Copy of [place] in Chart? -  Would patient like information on creating a medical advance directive? No - Patient declined     Chief Complaint  Patient presents with  . Medical Management of Chronic Issues    Routine Visit    HPI:  Pt is a 83 y.o. female seen today for medical management of chronic diseases.    Second degree AV block Stable. No complaints of chest pain nor shortness of breath.  Migraine without status migrainosus, not intractable Symptoms improved. Pt's symptoms managed on scheduled tylenol, melatonin and depakote for migraine prophylaxis  Hypertensive heart disease with CHF (congestive heart failure) (HCC) Stable. No edema. Symptoms managed on Lasix with potassium chloride  Cough Dry, non-productive. Improved since last week, Pt continues on prn Albuterol neb  Past Medical History:  Diagnosis Date  . Anxiety    unspecified  . Arthritis   . Breast cancer ([place]) 2007   Early stage left breast cancer status post partial colectomy, 04/2005 (Dr. Oliva Bustard)  . Chest pain, unspecified   . CHF (congestive heart failure) ([place])   . Chronic headache   . Colon cancer ([place])    status post partial colectomy, diagnosed 2000  . Colon neoplasm   . COPD (chronic obstructive pulmonary disease) ([place])   . Depression    unspecified  . Diabetes mellitus   . Fibrocystic breast   . History of breast cancer   . History of colon cancer   . HTN (hypertension) with goal to be determined   . Hyperlipidemia   . Hyperlipidemia, unspecified   . Hypertension   . Obesity, unspecified   . Sleep apnea    mild to moderate   Past Surgical History:  Procedure Laterality Date  . APPENDECTOMY    . BREAST LUMPECTOMY  2007   left  . BUNIONECTOMY Right   . CATARACT EXTRACTION W/ INTRAOCULAR LENS IMPLANT & ANTERIOR VITRECTOMY, BILATERAL Bilateral   . COLON SURGERY  1993  . COLOSTOMY    . DILATION AND CURETTAGE, DIAGNOSTIC / THERAPEUTIC     Fibroid removal  . INSERT REPLACE REMOVE PACEMAKER     ppm medtronic [id number] Adapta dula chamber rate responsive. 02/21/14  . MASTECTOMY PARTIAL / LUMPECTOMY    . PARTIAL COLECTOMY     Abdominal & Transanal   . TONSILLECTOMY    . TOTAL KNEE ARTHROPLASTY Right 07/03/2013   Right total knee arthroplasty using computer assisted navigation  . TUBAL LIGATION Bilateral 1971   with questionable appendectomy  No Known Allergies  Allergies as of 01/26/2017   No Known Allergies     Medication List        Accurate as of 01/26/17 11:59 PM. Always use your most recent med list.          acetaminophen 325 MG tablet Commonly known as:  TYLENOL Take 650 mg by mouth every 4 (four) hours as needed for fever. Do not exceed 3000 mg in 24hrs   acetaminophen 500 MG tablet Commonly known as:  TYLENOL Take 1,000 mg by mouth daily. 5 pm   albuterol (2.5 MG/3ML) 0.083% nebulizer  solution Commonly known as:  PROVENTIL Take 2.5 mg by nebulization every 6 (six) hours as needed for wheezing or shortness of breath. For wheezing ,sob  per [facility], visit note 04/05/16   alum & mag hydroxide-simeth 270-623-76 MG/5ML suspension Commonly known as:  MAALOX PLUS Take 30 mLs by mouth every 4 (four) hours as needed.   aspirin EC 81 MG tablet Take 81 mg by mouth daily.   COMBIVENT RESPIMAT 20-100 MCG/ACT Aers respimat Generic drug:  Ipratropium-Albuterol Inhale 1 puff into the lungs 4 (four) times daily.   demeclocycline 300 MG tablet Commonly known as:  DECLOMYCIN Take 600 mg 2 (two) times daily by mouth. 2 tabs   divalproex 125 MG capsule Commonly known as:  DEPAKOTE SPRINKLE Take 250 mg by mouth 2 (two) times daily.   docusate sodium 100 MG capsule Commonly known as:  COLACE Take 100 mg by mouth daily.   DULoxetine 60 MG capsule Commonly known as:  CYMBALTA Take 60 mg by mouth daily.   eucerin cream Apply liberal amount topically to dry areas of skin daily after bath and as needed. Ok to keep at bedside   feeding supplement (PRO-STAT SUGAR FREE 64) Liqd Take 30 mLs by mouth 2 (two) times daily between meals.   fluticasone-salmeterol 115-21 MCG/ACT inhaler Commonly known as:  ADVAIR HFA Inhale 2 puffs into the lungs 2 (two) times daily. 8 am and 8 pm   furosemide 20 MG tablet Commonly known as:  LASIX Take 20 mg by mouth daily.   gabapentin 400 MG capsule Commonly known as:  NEURONTIN Take 400 mg by mouth 3 (three) times daily.   glipiZIDE-metformin 2.5-500 MG tablet Commonly known as:  METAGLIP Take 1 tablet by mouth daily.   GLUCERNA Liqd Take 237 mLs by mouth 2 (two) times daily between meals.   ibuprofen 600 MG tablet Commonly known as:  ADVIL,MOTRIN Take 600 mg by mouth every 6 (six) hours as needed for headache.   ketotifen 0.025 % ophthalmic solution Commonly known as:  ZADITOR Place 1 drop into both eyes 2 (two) times  daily.   LORazepam 0.5 MG tablet Commonly known as:  ATIVAN Take 1 tablet (0.5 mg total) by mouth at bedtime.   magnesium oxide 400 MG tablet Commonly known as:  MAG-OX Take 400 mg by mouth 2 (two) times daily.   Melatonin 10 MG Tabs Take 1 tablet by mouth at bedtime.   meloxicam 7.5 MG tablet Commonly known as:  MOBIC Take 7.5 mg by mouth daily.   Menthol 1 MG Lozg Use as directed 1 lozenge in the mouth or throat every 6 (six) hours as needed.   nystatin powder Commonly known as:  MYCOSTATIN/NYSTOP Apply 1 application topically to abdominal folds daily for redness and irritation.   OXYGEN Inhale 2 L into the lungs continuous. Titrate to maintain sats > 90 % as needed. Inhale at  all times for COPD   pantoprazole 20 MG tablet Commonly known as:  PROTONIX Take 20 mg by mouth daily.   potassium chloride 10 MEQ tablet Commonly known as:  K-DUR,KLOR-CON Take 10 mEq by mouth every other day.   pravastatin 10 MG tablet Commonly known as:  PRAVACHOL Take 10 mg by mouth once a week. On sunday   SENIOR TABS Tabs Take 1 tablet by mouth daily.   senna-docusate 8.6-50 MG tablet Commonly known as:  Senokot-S Take 1 tablet by mouth 2 (two) times daily.   sucralfate 1 g tablet Commonly known as:  CARAFATE Take 1 g by mouth daily. Tapering dose   trandolapril 2 MG tablet Commonly known as:  MAVIK Take 2 mg by mouth daily.   traZODone 100 MG tablet Commonly known as:  DESYREL Take 1 tablet (100 mg total) by mouth at bedtime.   Vitamin D3 2000 units Tabs Take 1 tablet by mouth daily.       Review of Systems  Constitutional: Negative for activity change, appetite change, chills, diaphoresis and fever.  HENT: Negative for congestion, mouth sores, nosebleeds, postnasal drip, sneezing, sore throat, trouble swallowing and voice change.   Respiratory: Positive for cough. Negative for apnea, choking, chest tightness, shortness of breath and wheezing.   Cardiovascular:  Negative for chest pain, palpitations and leg swelling.  Gastrointestinal: Negative for abdominal distention, abdominal pain, constipation, diarrhea and nausea.  Genitourinary: Negative for difficulty urinating, dysuria, frequency and urgency.  Musculoskeletal: Positive for arthralgias (typical arthritis). Negative for back pain, gait problem and myalgias.  Skin: Negative for color change, pallor, rash and wound.  Neurological: Positive for weakness and headaches. Negative for dizziness, tremors, syncope, speech difficulty and numbness.  Psychiatric/Behavioral: Negative for agitation and behavioral problems.  All other systems reviewed and are negative.   Immunization History  Administered Date(s) Administered  . Influenza Split 11/08/2013, 11/21/2014  . Influenza, High Dose Seasonal PF 12/05/2012  . Influenza-Unspecified 08/05/2015, 09/22/2016  . PPD Test 03/05/2014, 10/09/2015, 02/10/2016  . Pneumococcal Polysaccharide-23 05/16/2010  . Pneumococcal-Unspecified 03/05/2014   Pertinent  Health Maintenance Due  Topic Date Due  . FOOT EXAM  03/23/1944  . OPHTHALMOLOGY EXAM  03/23/1944  . DEXA SCAN  03/22/2001  . PNA vac Low Risk Adult (2 of 2 - PCV13) 03/05/2015  . HEMOGLOBIN A1C  12/08/2016  . INFLUENZA VACCINE  Completed   Fall Risk  04/05/2016 12/15/2015 10/07/2015 09/09/2015  Falls in the past year? No No Yes Yes  Number falls in past yr: - - 2 or more 2 or more  Injury with Fall? - - No No  Risk Factor Category  - - - High Fall Risk  Risk for fall due to : - - - History of fall(s)  Follow up - - Education provided;Falls evaluation completed Education provided   Functional Status Survey:    Vitals:   01/26/17 1016  BP: 103/64  Pulse: 98  Resp: 20  Temp: 97.9 F (36.6 C)  TempSrc: Oral  SpO2: 96%  Weight: 214 lb 8 oz (97.3 kg)  Height: 5\' 4"  (1.626 m)   Body mass index is 36.82 kg/m. Physical Exam  Constitutional: She is oriented to person, place, and time. Vital  signs are normal. She appears well-developed and well-nourished. She is active and cooperative. She does not appear ill. No distress. Nasal cannula in place.  HENT:  Head: Normocephalic and atraumatic.  Mouth/Throat: Uvula is midline, oropharynx is clear and moist and mucous membranes are normal. Mucous  membranes are not pale, not dry and not cyanotic.  Eyes: Conjunctivae, EOM and lids are normal. Pupils are equal, round, and reactive to light.  Neck: Trachea normal, normal range of motion and full passive range of motion without pain. Neck supple. No JVD present. No tracheal deviation, no edema and no erythema present. No thyromegaly present.  Cardiovascular: Normal rate, regular rhythm, normal heart sounds, intact distal pulses and normal pulses. Exam reveals no gallop, no distant heart sounds and no friction rub.  No murmur heard. Pulses:      Dorsalis pedis pulses are 2+ on the right side, and 2+ on the left side.  No edema  Pulmonary/Chest: Effort normal. No accessory muscle usage. No respiratory distress. She has decreased breath sounds in the right lower field and the left lower field. She has no wheezes. She has no rhonchi. She has no rales. She exhibits no tenderness.  Abdominal: Soft. Normal appearance and bowel sounds are normal. She exhibits no distension and no ascites. There is no tenderness.  Musculoskeletal: Normal range of motion. She exhibits no edema or tenderness.  Expected osteoarthritis, stiffness; Bilateral Calves soft, supple. Negative Homan's Sign. B- pedal pulses equal; generalized edema  Neurological: She is alert and oriented to person, place, and time. She has normal strength. She exhibits abnormal muscle tone. Coordination and gait abnormal.  Skin: Skin is warm, dry and intact. She is not diaphoretic. No cyanosis. No pallor. Nails show no clubbing.  Psychiatric: She has a normal mood and affect. Her speech is normal and behavior is normal. Judgment and thought content  normal. Cognition and memory are normal.  Nursing note and vitals reviewed.   Labs reviewed: Recent Labs    02/12/16 1020  06/22/16 0545  09/28/16 0450 09/28/16 1130 11/19/16 1631  NA 119*   < > 126*   < > 129* 133* 131*  K 5.2*   < > 4.5   < > 5.3* 4.6 4.6  CL 81*   < > 87*   < > 89* 87* 85*  CO2 30   < > 31   < > 28 37* 37*  GLUCOSE 123*   < > 86   < > 64* 89 103*  BUN 15   < > 28*   < > 24* 22* 14  CREATININE 0.78   < > 0.57   < > 0.60 0.62 0.47  CALCIUM 8.3*   < > 8.9   < > 8.7* 8.9 8.8*  MG 1.5*  --  1.8  --   --   --   --    < > = values in this interval not displayed.   Recent Labs    08/19/16 0700 09/28/16 1130 11/19/16 1631  AST 16 16 19   ALT 13* 14 13*  ALKPHOS 53 58 50  BILITOT 0.4 0.5 0.6  PROT 5.4* 5.5* 5.6*  ALBUMIN 2.8* 2.3* 2.6*   Recent Labs    08/19/16 0700 09/28/16 1130 11/19/16 1631  WBC 6.5 9.0 7.1  NEUTROABS 3.5 6.4 4.4  HGB 10.6* 10.6* 10.6*  HCT 32.2* 31.9* 32.9*  MCV 81.9 83.7 89.1  PLT 232 273 252   Lab Results  Component Value Date   TSH 2.998 02/12/2016   Lab Results  Component Value Date   HGBA1C 5.6 06/08/2016   Lab Results  Component Value Date   CHOL 106 02/12/2016   HDL 42 02/12/2016   LDLCALC 49 02/12/2016   TRIG 74 02/12/2016   CHOLHDL 2.5 02/12/2016  Significant Diagnostic Results in last 30 days:  No results found.  Assessment/Plan Kathryn Hayes was seen today for medical management of chronic issues.  Diagnoses and all orders for this visit:  Migraine without status migrainosus, not intractable, unspecified migraine type  Second degree AV block  Hypertensive heart disease with congestive heart failure, unspecified heart failure type ([place])  Cough   above conditions stable  Continue current medication regimen  Continue to encourage pt to interact with other residents and participate in activities  Continue daily weights  PRN Guaifenesin for cough  PRN albuterol nebs for cough  Family/ staff  Communication:   Total Time:  Documentation:  Face to Face:  Family/Phone:   Labs/tests ordered:  Not due  Medication list reviewed and assessed for continued appropriateness. Monthly medication orders reviewed and signed.  Vikki Ports, NP-C Geriatrics [facility] [phone], [place] [address] Cell Phone (Mon-Fri 8am-5pm):  [phone] On Call:  [phone] & follow prompts after 5pm & weekends Office Phone:  [phone] Office Fax:  [phone]

## 2017-01-27 NOTE — Assessment & Plan Note (Signed)
Stable. No complaints of chest pain nor shortness of breath.

## 2017-01-27 NOTE — Assessment & Plan Note (Signed)
Stable. No edema. Symptoms managed on Lasix with potassium chloride

## 2017-01-27 NOTE — Assessment & Plan Note (Signed)
Symptoms improved. Pt's symptoms managed on scheduled tylenol, melatonin and depakote for migraine prophylaxis

## 2017-02-04 ENCOUNTER — Encounter
Admission: RE | Admit: 2017-02-04 | Discharge: 2017-02-04 | Disposition: A | Discharge status: Home / Self Care | Payer: Medicare Other | Source: Ambulatory Visit | Attending: Internal Medicine | Admitting: Internal Medicine

## 2017-02-14 ENCOUNTER — Inpatient Hospital Stay
Admission: EM | Admit: 2017-02-14 | Discharge: 2017-02-18 | DRG: 871 | Disposition: A | Discharge status: Skilled Nursing Facility | Payer: Medicare Other | Attending: Internal Medicine | Admitting: Internal Medicine

## 2017-02-14 ENCOUNTER — Non-Acute Institutional Stay (SKILLED_NURSING_FACILITY): Payer: Medicare Other | Admitting: Gerontology

## 2017-02-14 ENCOUNTER — Encounter: Payer: Self-pay | Admitting: Emergency Medicine

## 2017-02-14 ENCOUNTER — Other Ambulatory Visit: Payer: Self-pay

## 2017-02-14 ENCOUNTER — Emergency Department: Payer: Medicare Other

## 2017-02-14 ENCOUNTER — Other Ambulatory Visit
Admission: RE | Admit: 2017-02-14 | Discharge: 2017-02-14 | Disposition: A | Discharge status: Home / Self Care | Payer: Medicare Other | Source: Other Acute Inpatient Hospital | Attending: Internal Medicine | Admitting: Internal Medicine

## 2017-02-14 DIAGNOSIS — J9601 Acute respiratory failure with hypoxia: Secondary | ICD-10-CM | POA: Diagnosis not present

## 2017-02-14 DIAGNOSIS — E1165 Type 2 diabetes mellitus with hyperglycemia: Secondary | ICD-10-CM | POA: Diagnosis present

## 2017-02-14 DIAGNOSIS — J9622 Acute and chronic respiratory failure with hypercapnia: Secondary | ICD-10-CM | POA: Diagnosis present

## 2017-02-14 DIAGNOSIS — Z9049 Acquired absence of other specified parts of digestive tract: Secondary | ICD-10-CM

## 2017-02-14 DIAGNOSIS — Z961 Presence of intraocular lens: Secondary | ICD-10-CM | POA: Diagnosis present

## 2017-02-14 DIAGNOSIS — A419 Sepsis, unspecified organism: Secondary | ICD-10-CM

## 2017-02-14 DIAGNOSIS — Z800 Family history of malignant neoplasm of digestive organs: Secondary | ICD-10-CM

## 2017-02-14 DIAGNOSIS — E785 Hyperlipidemia, unspecified: Secondary | ICD-10-CM | POA: Diagnosis present

## 2017-02-14 DIAGNOSIS — R140 Abdominal distension (gaseous): Secondary | ICD-10-CM

## 2017-02-14 DIAGNOSIS — G4733 Obstructive sleep apnea (adult) (pediatric): Secondary | ICD-10-CM | POA: Diagnosis present

## 2017-02-14 DIAGNOSIS — J9621 Acute and chronic respiratory failure with hypoxia: Secondary | ICD-10-CM | POA: Diagnosis present

## 2017-02-14 DIAGNOSIS — Z7982 Long term (current) use of aspirin: Secondary | ICD-10-CM

## 2017-02-14 DIAGNOSIS — J440 Chronic obstructive pulmonary disease with acute lower respiratory infection: Secondary | ICD-10-CM | POA: Diagnosis present

## 2017-02-14 DIAGNOSIS — E871 Hypo-osmolality and hyponatremia: Secondary | ICD-10-CM | POA: Diagnosis present

## 2017-02-14 DIAGNOSIS — E1140 Type 2 diabetes mellitus with diabetic neuropathy, unspecified: Secondary | ICD-10-CM | POA: Diagnosis present

## 2017-02-14 DIAGNOSIS — Z853 Personal history of malignant neoplasm of breast: Secondary | ICD-10-CM | POA: Diagnosis not present

## 2017-02-14 DIAGNOSIS — Z950 Presence of cardiac pacemaker: Secondary | ICD-10-CM

## 2017-02-14 DIAGNOSIS — Z85038 Personal history of other malignant neoplasm of large intestine: Secondary | ICD-10-CM

## 2017-02-14 DIAGNOSIS — J441 Chronic obstructive pulmonary disease with (acute) exacerbation: Secondary | ICD-10-CM | POA: Insufficient documentation

## 2017-02-14 DIAGNOSIS — F329 Major depressive disorder, single episode, unspecified: Secondary | ICD-10-CM | POA: Diagnosis present

## 2017-02-14 DIAGNOSIS — E669 Obesity, unspecified: Secondary | ICD-10-CM | POA: Diagnosis present

## 2017-02-14 DIAGNOSIS — K219 Gastro-esophageal reflux disease without esophagitis: Secondary | ICD-10-CM | POA: Diagnosis present

## 2017-02-14 DIAGNOSIS — I110 Hypertensive heart disease with heart failure: Secondary | ICD-10-CM | POA: Diagnosis present

## 2017-02-14 DIAGNOSIS — R6521 Severe sepsis with septic shock: Secondary | ICD-10-CM | POA: Diagnosis present

## 2017-02-14 DIAGNOSIS — Z6836 Body mass index (BMI) 36.0-36.9, adult: Secondary | ICD-10-CM

## 2017-02-14 DIAGNOSIS — Z9981 Dependence on supplemental oxygen: Secondary | ICD-10-CM | POA: Diagnosis not present

## 2017-02-14 DIAGNOSIS — Z79899 Other long term (current) drug therapy: Secondary | ICD-10-CM

## 2017-02-14 DIAGNOSIS — G9341 Metabolic encephalopathy: Secondary | ICD-10-CM | POA: Diagnosis present

## 2017-02-14 DIAGNOSIS — R0603 Acute respiratory distress: Secondary | ICD-10-CM | POA: Diagnosis not present

## 2017-02-14 DIAGNOSIS — R4182 Altered mental status, unspecified: Secondary | ICD-10-CM

## 2017-02-14 DIAGNOSIS — F419 Anxiety disorder, unspecified: Secondary | ICD-10-CM | POA: Diagnosis present

## 2017-02-14 DIAGNOSIS — I959 Hypotension, unspecified: Secondary | ICD-10-CM

## 2017-02-14 DIAGNOSIS — I5032 Chronic diastolic (congestive) heart failure: Secondary | ICD-10-CM | POA: Diagnosis present

## 2017-02-14 DIAGNOSIS — Z7984 Long term (current) use of oral hypoglycemic drugs: Secondary | ICD-10-CM

## 2017-02-14 DIAGNOSIS — J209 Acute bronchitis, unspecified: Secondary | ICD-10-CM | POA: Diagnosis present

## 2017-02-14 DIAGNOSIS — J9600 Acute respiratory failure, unspecified whether with hypoxia or hypercapnia: Secondary | ICD-10-CM

## 2017-02-14 DIAGNOSIS — Z96651 Presence of right artificial knee joint: Secondary | ICD-10-CM | POA: Diagnosis present

## 2017-02-14 LAB — COMPREHENSIVE METABOLIC PANEL
ALBUMIN: 3 g/dL — AB (ref 3.5–5.0)
ALBUMIN: 2.8 g/dL — AB (ref 3.5–5.0)
ALK PHOS: 51 U/L (ref 38–126)
ALT: 19 U/L (ref 14–54)
ALT: 23 U/L (ref 14–54)
ANION GAP: 12 (ref 5–15)
AST: 29 U/L (ref 15–41)
AST: 28 U/L (ref 15–41)
Alkaline Phosphatase: 48 U/L (ref 38–126)
Anion gap: 11 (ref 5–15)
BUN: 14 mg/dL (ref 6–20)
BUN: 18 mg/dL (ref 6–20)
CALCIUM: 8.8 mg/dL — AB (ref 8.9–10.3)
CHLORIDE: 77 mmol/L — AB (ref 101–111)
CHLORIDE: 75 mmol/L — AB (ref 101–111)
CO2: 33 mmol/L — ABNORMAL HIGH (ref 22–32)
CO2: 33 mmol/L — AB (ref 22–32)
Calcium: 9.3 mg/dL (ref 8.9–10.3)
Creatinine, Ser: 0.47 mg/dL (ref 0.44–1.00)
Creatinine, Ser: 0.59 mg/dL (ref 0.44–1.00)
GFR calc Af Amer: >60 mL/min (ref >60)
GFR calc Af Amer: >60 mL/min (ref >60)
GFR calc non Af Amer: >60 mL/min (ref >60)
GFR calc non Af Amer: >60 mL/min (ref >60)
GLUCOSE: 68 mg/dL (ref 65–99)
GLUCOSE: 214 mg/dL — AB (ref 65–99)
POTASSIUM: 5 mmol/L (ref 3.5–5.1)
Potassium: 4.7 mmol/L (ref 3.5–5.1)
SODIUM: 121 mmol/L — AB (ref 135–145)
SODIUM: 120 mmol/L — AB (ref 135–145)
Total Bilirubin: 0.7 mg/dL (ref 0.3–1.2)
Total Bilirubin: 0.7 mg/dL (ref 0.3–1.2)
Total Protein: 6.1 g/dL — ABNORMAL LOW (ref 6.5–8.1)
Total Protein: 6.1 g/dL — ABNORMAL LOW (ref 6.5–8.1)

## 2017-02-14 LAB — CBC WITH DIFFERENTIAL/PLATELET
BASOS ABS: 0 10*3/uL (ref 0–0.1)
BASOS PCT: 0 %
Basophils Absolute: 0 10*3/uL (ref 0–0.1)
Basophils Relative: 0 %
EOS ABS: 0 10*3/uL (ref 0–0.7)
EOS PCT: 0 %
Eosinophils Absolute: 0 10*3/uL (ref 0–0.7)
Eosinophils Relative: 0 %
HCT: 35.5 % (ref 35.0–47.0)
HEMATOCRIT: 36.4 % (ref 35.0–47.0)
HEMOGLOBIN: 12.1 g/dL (ref 12.0–16.0)
Hemoglobin: 11.8 g/dL — ABNORMAL LOW (ref 12.0–16.0)
LYMPHS PCT: 15 %
Lymphocytes Relative: 6 %
Lymphs Abs: 1.6 10*3/uL (ref 1.0–3.6)
Lymphs Abs: 0.7 10*3/uL — ABNORMAL LOW (ref 1.0–3.6)
MCH: 29.2 pg (ref 26.0–34.0)
MCH: 28.6 pg (ref 26.0–34.0)
MCHC: 33.2 g/dL (ref 32.0–36.0)
MCHC: 33.2 g/dL (ref 32.0–36.0)
MCV: 88 fL (ref 80.0–100.0)
MCV: 86.2 fL (ref 80.0–100.0)
MONOS PCT: 3 %
Monocytes Absolute: 1.9 10*3/uL — ABNORMAL HIGH (ref 0.2–0.9)
Monocytes Absolute: 0.3 10*3/uL (ref 0.2–0.9)
Monocytes Relative: 18 %
NEUTROS ABS: 10.8 10*3/uL — AB (ref 1.4–6.5)
NEUTROS PCT: 91 %
Neutro Abs: 7.3 10*3/uL — ABNORMAL HIGH (ref 1.4–6.5)
Neutrophils Relative %: 67 %
PLATELETS: 294 10*3/uL (ref 150–440)
Platelets: 313 10*3/uL (ref 150–440)
RBC: 4.03 MIL/uL (ref 3.80–5.20)
RBC: 4.22 MIL/uL (ref 3.80–5.20)
RDW: 15.2 % — ABNORMAL HIGH (ref 11.5–14.5)
RDW: 14.8 % — ABNORMAL HIGH (ref 11.5–14.5)
WBC: 10.8 10*3/uL (ref 3.6–11.0)
WBC: 11.9 10*3/uL — ABNORMAL HIGH (ref 3.6–11.0)

## 2017-02-14 LAB — LACTIC ACID, PLASMA: Lactic Acid, Venous: 1.9 mmol/L (ref 0.5–1.9)

## 2017-02-14 LAB — BLOOD GAS, ARTERIAL
ACID-BASE EXCESS: 11.8 mmol/L — AB (ref 0.0–2.0)
Bicarbonate: 38.7 mmol/L — ABNORMAL HIGH (ref 20.0–28.0)
Delivery systems: POSITIVE
Expiratory PAP: 8
FIO2: 0.4
Inspiratory PAP: 14
Mechanical Rate: 10
O2 SAT: 92.9 %
PCO2 ART: 61 mmHg — AB (ref 32.0–48.0)
PO2 ART: 66 mmHg — AB (ref 83.0–108.0)
Patient temperature: 37
pH, Arterial: 7.41 (ref 7.350–7.450)

## 2017-02-14 LAB — URINALYSIS, COMPLETE (UACMP) WITH MICROSCOPIC
BACTERIA UA: NONE SEEN
BILIRUBIN URINE: NEGATIVE
GLUCOSE, UA: NEGATIVE mg/dL
KETONES UR: NEGATIVE mg/dL
Leukocytes, UA: NEGATIVE
NITRITE: NEGATIVE
PROTEIN: NEGATIVE mg/dL
Specific Gravity, Urine: 1.011 (ref 1.005–1.030)
pH: 5 (ref 5.0–8.0)

## 2017-02-14 LAB — BRAIN NATRIURETIC PEPTIDE
B NATRIURETIC PEPTIDE 5: 823 pg/mL — AB (ref 0.0–100.0)
B Natriuretic Peptide: 708 pg/mL — ABNORMAL HIGH (ref 0.0–100.0)

## 2017-02-14 LAB — GLUCOSE, CAPILLARY
Glucose-Capillary: 194 mg/dL — ABNORMAL HIGH (ref 65–99)
Glucose-Capillary: 197 mg/dL — ABNORMAL HIGH (ref 65–99)

## 2017-02-14 LAB — MAGNESIUM: Magnesium: 1.6 mg/dL — ABNORMAL LOW (ref 1.7–2.4)

## 2017-02-14 LAB — INFLUENZA PANEL BY PCR (TYPE A & B)
Influenza A By PCR: NEGATIVE
Influenza B By PCR: NEGATIVE

## 2017-02-14 LAB — TROPONIN I: Troponin I: <0.03 ng/mL (ref <0.03)

## 2017-02-14 MED ORDER — ACETAMINOPHEN 650 MG RE SUPP
650.0000 mg | Freq: Four times a day (QID) | RECTAL | Status: DC | PRN
Start: 2017-02-14 — End: 2017-02-18

## 2017-02-14 MED ORDER — INSULIN ASPART 100 UNIT/ML ~~LOC~~ SOLN
0.0000 [IU] | SUBCUTANEOUS | Status: DC
  Administered 2017-02-14: 3 [IU] via SUBCUTANEOUS
  Administered 2017-02-15: 2 [IU] via SUBCUTANEOUS
  Administered 2017-02-15: 5 [IU] via SUBCUTANEOUS
  Administered 2017-02-15 (×3): 3 [IU] via SUBCUTANEOUS
  Administered 2017-02-16: 2 [IU] via SUBCUTANEOUS
  Filled 2017-02-14 (×8): qty 1

## 2017-02-14 MED ORDER — ENOXAPARIN SODIUM 40 MG/0.4ML ~~LOC~~ SOLN
40.0000 mg | SUBCUTANEOUS | Status: DC
  Administered 2017-02-14 – 2017-02-17 (×4): 40 mg via SUBCUTANEOUS
  Filled 2017-02-14 (×4): qty 0.4

## 2017-02-14 MED ORDER — ACETAMINOPHEN 325 MG PO TABS
650.0000 mg | ORAL_TABLET | Freq: Four times a day (QID) | ORAL | Status: DC | PRN
Start: 2017-02-14 — End: 2017-02-18
  Filled 2017-02-14: qty 2

## 2017-02-14 MED ORDER — PIPERACILLIN-TAZOBACTAM 3.375 G IVPB 30 MIN
3.3750 g | Freq: Once | INTRAVENOUS | Status: AC
  Administered 2017-02-14: 3.375 g via INTRAVENOUS
  Filled 2017-02-14: qty 50

## 2017-02-14 MED ORDER — ALBUTEROL SULFATE (2.5 MG/3ML) 0.083% IN NEBU
2.5000 mg | INHALATION_SOLUTION | RESPIRATORY_TRACT | Status: DC | PRN
  Administered 2017-02-15: 2.5 mg via RESPIRATORY_TRACT
  Filled 2017-02-14: qty 3

## 2017-02-14 MED ORDER — HYDROCODONE-ACETAMINOPHEN 5-325 MG PO TABS
1.0000 | ORAL_TABLET | ORAL | Status: DC | PRN
  Administered 2017-02-17: 1 via ORAL
  Filled 2017-02-14: qty 1

## 2017-02-14 MED ORDER — VANCOMYCIN HCL IN DEXTROSE 1-5 GM/200ML-% IV SOLN
1000.0000 mg | Freq: Once | INTRAVENOUS | Status: AC
  Administered 2017-02-14: 1000 mg via INTRAVENOUS
  Filled 2017-02-14: qty 200

## 2017-02-14 MED ORDER — BISACODYL 5 MG PO TBEC
5.0000 mg | DELAYED_RELEASE_TABLET | Freq: Every day | ORAL | Status: DC | PRN
  Administered 2017-02-17: 5 mg via ORAL
  Filled 2017-02-14: qty 1

## 2017-02-14 MED ORDER — INSULIN ASPART 100 UNIT/ML ~~LOC~~ SOLN: 0.0000 [IU] | Freq: Every day | SUBCUTANEOUS | Status: DC

## 2017-02-14 MED ORDER — IPRATROPIUM-ALBUTEROL 0.5-2.5 (3) MG/3ML IN SOLN
3.0000 mL | Freq: Four times a day (QID) | RESPIRATORY_TRACT | Status: DC
  Administered 2017-02-15 (×2): 3 mL via RESPIRATORY_TRACT
  Filled 2017-02-14 (×2): qty 3

## 2017-02-14 MED ORDER — ONDANSETRON HCL 4 MG/2ML IJ SOLN
4.0000 mg | Freq: Four times a day (QID) | INTRAMUSCULAR | Status: DC | PRN
Start: 2017-02-14 — End: 2017-02-18

## 2017-02-14 MED ORDER — INSULIN ASPART 100 UNIT/ML ~~LOC~~ SOLN
0.0000 [IU] | Freq: Three times a day (TID) | SUBCUTANEOUS | Status: DC
Start: 2017-02-15 — End: 2017-02-14

## 2017-02-14 MED ORDER — SODIUM CHLORIDE 0.9 % IV BOLUS (SEPSIS)
250.0000 mL | Freq: Once | INTRAVENOUS | Status: AC
  Administered 2017-02-14: 250 mL via INTRAVENOUS

## 2017-02-14 MED ORDER — ONDANSETRON HCL 4 MG PO TABS: 4.0000 mg | ORAL_TABLET | Freq: Four times a day (QID) | ORAL | Status: DC | PRN

## 2017-02-14 MED ORDER — SODIUM CHLORIDE 0.9 % IV BOLUS (SEPSIS)
1750.0000 mL | Freq: Once | INTRAVENOUS | Status: AC
  Administered 2017-02-14: 1750 mL via INTRAVENOUS

## 2017-02-14 MED ORDER — SODIUM CHLORIDE 0.9 % IV SOLN
INTRAVENOUS | Status: DC
  Administered 2017-02-14: 23:00:00 via INTRAVENOUS

## 2017-02-14 MED ORDER — SENNOSIDES-DOCUSATE SODIUM 8.6-50 MG PO TABS: 1.0000 | ORAL_TABLET | Freq: Every evening | ORAL | Status: DC | PRN

## 2017-02-14 NOTE — ED Notes (Signed)
Dr. Chen at bedside.

## 2017-02-14 NOTE — Consult Note (Signed)
Name: Kathryn Hayes MRN: [id number] DOB: 02-Feb-1934    ADMISSION DATE:  02/14/2017 CONSULTATION DATE: 02/14/2017  REFERRING MD : Dr. Bridgett Larsson   CHIEF COMPLAINT: Respiratory Distress   BRIEF PATIENT DESCRIPTION:  83 yo female admitted with septic shock of unknown etiology, acute on chronic respiratory failure requiring Bipap, and acute encephalopathy   SIGNIFICANT EVENTS  02/11-Pt admitted to stepdown unit   STUDIES:  None   HISTORY OF PRESENT ILLNESS:   This is an 83 yo female with a PMH of OSA, HTN, Hyperlipidemia, Pacemaker, Colon and Breast Cancer, Diabetes Mellitus, Depression, COPD, Chronic Diastolic CHF, Arthritis, Obesity, and Anxiety. She presented to [facility] ER 02/11 via EMS from [place] in respiratory distress.  Upon EMS arrival it was noted the pt had audible rales and febrile with temp of 101 F, she was placed on CPAP with improvement of respiratory status.  In the ER she was transitioned to Bipap and upon arrival she was alert and oriented, however became lethargic ABG results unremarkable.  Lab results revealed Na 121, BNP 823, wbc 11.9, UA negative, Influenza negative, and CXR negative.  Per ER notes the pt has been taking antibiotics for fever and congestion.  She was subsequently admitted to the stepdown unit by hospitalist team for further workup and treatment.  PAST MEDICAL HISTORY :   has a past medical history of Anxiety, Arthritis, Breast cancer ([place]) (2007), Chest pain, unspecified, CHF (congestive heart failure) ([place]), Chronic headache, Colon cancer ([place]), Colon neoplasm, COPD (chronic obstructive pulmonary disease) ([place]), Depression, Diabetes mellitus, Fibrocystic breast, History of breast cancer, History of colon cancer, HTN (hypertension) with goal to be determined, Hyperlipidemia, Hyperlipidemia, unspecified, Hypertension, Obesity, unspecified, and Sleep apnea.  has a past surgical history that includes Breast lumpectomy (2007); Colon surgery (1993); Tubal ligation  (Bilateral, 1971); Appendectomy; Colostomy; Partial colectomy; Tonsillectomy; Bunionectomy (Right); Dilation and curettage, diagnostic / therapeutic; Cataract extraction w/ intraocular lens implant & anterior vitrectomy, bilateral (Bilateral); Total knee arthroplasty (Right, 07/03/2013); Mastectomy partial / lumpectomy; and INSERT REPLACE REMOVE PACEMAKER. Prior to Admission medications   Medication Sig Start Date End Date Taking? Authorizing Provider  acetaminophen (TYLENOL) 325 MG tablet Take 650 mg by mouth every 4 (four) hours as needed for fever. Do not exceed 3000 mg in 24hrs    [provider]  acetaminophen (TYLENOL) 500 MG tablet Take 1,000 mg by mouth daily. 5 pm    [provider]  albuterol (PROVENTIL) (2.5 MG/3ML) 0.083% nebulizer solution Take 2.5 mg by nebulization every 6 (six) hours as needed for wheezing or shortness of breath. For wheezing ,sob  per [facility], visit note 04/05/16    [provider]  alum & mag hydroxide-simeth (MAALOX PLUS) 400-400-40 MG/5ML suspension Take 30 mLs by mouth every 4 (four) hours as needed.     [provider]  Amino Acids-Protein Hydrolys (FEEDING SUPPLEMENT, PRO-STAT SUGAR FREE 64,) LIQD Take 30 mLs by mouth 2 (two) times daily between meals.     [provider]  aspirin EC 81 MG tablet Take 81 mg by mouth daily.    [provider]  Cholecalciferol (VITAMIN D3) 2000 units TABS Take 1 tablet by mouth daily.     [provider]  demeclocycline (DECLOMYCIN) 300 MG tablet Take 600 mg 2 (two) times daily by mouth. 2 tabs    [provider]  divalproex (DEPAKOTE SPRINKLE) 125 MG capsule Take 250 mg by mouth 2 (two) times daily.     [provider]  docusate  sodium (COLACE) 100 MG capsule Take 100 mg by mouth daily.     [provider]  DULoxetine (CYMBALTA) 60 MG capsule Take 60 mg by mouth daily.     [provider]  fluticasone-salmeterol (ADVAIR  HFA) 115-21 MCG/ACT inhaler Inhale 2 puffs into the lungs 2 (two) times daily. 8 am and 8 pm    [provider]  furosemide (LASIX) 20 MG tablet Take 20 mg by mouth daily.     [provider]  gabapentin (NEURONTIN) 400 MG capsule Take 400 mg by mouth 3 (three) times daily.     [provider]  glipiZIDE-metformin (METAGLIP) 2.5-500 MG tablet Take 1 tablet by mouth daily.     [provider]  GLUCERNA (GLUCERNA) LIQD Take 237 mLs by mouth 2 (two) times daily between meals.    [provider]  ibuprofen (ADVIL,MOTRIN) 600 MG tablet Take 600 mg by mouth every 6 (six) hours as needed for headache.     [provider]  Ipratropium-Albuterol (COMBIVENT RESPIMAT) 20-100 MCG/ACT AERS respimat Inhale 1 puff into the lungs 4 (four) times daily.     [provider]  ketotifen (ZADITOR) 0.025 % ophthalmic solution Place 1 drop into both eyes 2 (two) times daily.     [provider]  LORazepam (ATIVAN) 0.5 MG tablet Take 1 tablet (0.5 mg total) by mouth at bedtime. 01/26/17   Toni Arthurs, NP  magnesium oxide (MAG-OX) 400 MG tablet Take 400 mg by mouth 2 (two) times daily.     [provider]  Melatonin 10 MG TABS Take 1 tablet by mouth at bedtime.    [provider]  meloxicam (MOBIC) 7.5 MG tablet Take 7.5 mg by mouth daily.     [provider]  Menthol 1 MG LOZG Use as directed 1 lozenge in the mouth or throat every 6 (six) hours as needed.    [provider]  Multiple Vitamins-Minerals (SENIOR TABS) TABS Take 1 tablet by mouth daily.    [provider]  nystatin (MYCOSTATIN/NYSTOP) powder Apply 1 application topically to abdominal folds daily for redness and irritation.    [provider]  OXYGEN Inhale 2 L into the lungs continuous. Titrate to maintain sats > 90 % as needed. Inhale at all times for COPD    [provider]  pantoprazole (PROTONIX) 20 MG tablet Take 20 mg by  mouth daily.    [provider]  potassium chloride (K-DUR,KLOR-CON) 10 MEQ tablet Take 10 mEq by mouth every other day.     [provider]  pravastatin (PRAVACHOL) 10 MG tablet Take 10 mg by mouth once a week. On sunday    [provider]  senna-docusate (SENOKOT-S) 8.6-50 MG tablet Take 1 tablet by mouth 2 (two) times daily.     [provider]  Skin Protectants, Misc. (EUCERIN) cream Apply liberal amount topically to dry areas of skin daily after bath and as needed. Ok to keep at bedside    [provider]  sucralfate (CARAFATE) 1 G tablet Take 1 g by mouth daily. Tapering dose    [provider]  trandolapril (MAVIK) 2 MG tablet Take 2 mg by mouth daily.    [provider]  traZODone (DESYREL) 100 MG tablet Take 1 tablet (100 mg total) by mouth at bedtime. 08/25/15   Hillary Bow, MD   No Known Allergies  FAMILY HISTORY:  family history includes Alzheimer's disease in her mother; Breast cancer in her mother;  Colon cancer in her mother; Colon polyps in her sister; Factor V Leiden deficiency in her sister; Prostate cancer in her father. SOCIAL HISTORY:  reports that she quit smoking about 6 years ago. Her smoking use included cigarettes. She has a 75.00 pack-year smoking history. she has never used smokeless tobacco. She reports that she does not drink alcohol.  REVIEW OF SYSTEMS:   Unable to assess pt lethargic   SUBJECTIVE:  Unable to assess pt lethargic   VITAL SIGNS: Temp:  [97.6 F (36.4 C)] 97.6 F (36.4 C) (02/11 1925) Pulse Rate:  [62-114] 104 (02/11 2130) Resp:  [18-32] 19 (02/11 2130) BP: (72-130)/(44-88) 85/53 (02/11 2130) SpO2:  [92 %-96 %] 92 % (02/11 2130) FiO2 (%):  [40 %] 40 % (02/11 1937) Weight:  [97.5 kg (215 lb)] 97.5 kg (215 lb) (02/11 1927)  PHYSICAL EXAMINATION: General: acutely ill appearing female, NAD on Bipap  Neuro: lethargic, follows commands, PERRL HEENT: supple, no  JVD Cardiovascular: vpaced, no M/R/G Lungs: rhonchi with faint expiratory wheezes throughout, even, non labored on Bipap  Abdomen: obese, +BS x4, soft, non tender, non distended  Musculoskeletal: normal bulk and tone, no edema  Skin: scattered ecchymosis, no rashes or lesions   Recent Labs  Lab 02/14/17 1220 02/14/17 2008  NA 121* 120*  K 5.0 4.7  CL 77* 75*  CO2 33* 33*  BUN 14 18  CREATININE 0.47 0.59  GLUCOSE 68 214*   Recent Labs  Lab 02/14/17 1220 02/14/17 1923  HGB 11.8* 12.1  HCT 35.5 36.4  WBC 10.8 11.9*  PLT 294 313   Dg Chest Portable 1 View  Result Date: 02/14/2017 CLINICAL DATA:  83 year old female with a history of respiratory distress EXAM: PORTABLE CHEST 1 VIEW COMPARISON:  02/14/2017, 09/22/2015 FINDINGS: Cardiomediastinal silhouette unchanged with cardiomegaly. Calcifications of the aortic arch. No evidence of interlobular septal thickening. No confluent airspace disease. No pneumothorax. No pleural effusion. Left chest wall cardiac pacing device. Coarsened interstitial markings. IMPRESSION: Chronic lung changes without evidence of superimposed acute cardiopulmonary disease. Cardiomegaly. Unchanged cardiac pacing device. Electronically Signed   By: Corrie Mckusick D.O.   On: 02/14/2017 21:05   Dg Chest Portable 1 View  Result Date: 02/14/2017 CLINICAL DATA:  Respiratory distress EXAM: PORTABLE CHEST 1 VIEW COMPARISON:  9187 chest radiograph. FINDINGS: Right rotated chest radiographs. Stable configuration of 2 lead left subclavian pacemaker. Stable cardiomediastinal silhouette with mild cardiomegaly. No pneumothorax. No pleural effusion. No overt pulmonary edema. Left retrocardiac atelectasis. IMPRESSION: Stable mild cardiomegaly without overt pulmonary edema. Left retrocardiac atelectasis. Electronically Signed   By: Ilona Sorrel M.D.   On: 02/14/2017 19:39    ASSESSMENT / PLAN: Septic Shock of unknown etiology  Acute on chronic respiratory failure possible  secondary to URI   Hypotension secondary to sepsis  Hyponatremia Acute encephalopathy   Hx: Diabetes Mellitus, CHF, COPD, OSA, and HTN P: Prn Bipap for dyspnea and/or hypoxia, will need CPAP qhs once off Bipap  Repeat CXR in am  Scheduled and prn bronchodilator therapy  Maintain map >65 NS @50  ml/hr  Continuous telemetry monitoring  Trend BMP  Replace electrolytes as indicated Monitor UOP  Lovenox for VTE prophylaxis  Trend CBC Monitor for s/sx of bleeding and transfuse for hgb <7 Avoid sedating medications  Trend WBC and monitor fever curve Trend lactic acid and PCT  Follow cultures  Continue current abx for now  CBG's q4hrs and SSI   -Pts family updated regarding plan of care and all questions answered 02/14/2017  Marda Stalker, [place] Pager [phone] (please enter 7 digits) PCCM Consult Pager [phone] (please enter 7 digits)

## 2017-02-14 NOTE — ED Provider Notes (Signed)
[facility] Emergency Department Provider Note   ____________________________________________   First MD Initiated Contact with Patient 02/14/17 1923     (approximate)  I have reviewed the triage vital signs and the nursing notes.   HISTORY  Chief Complaint Respiratory Distress  history limited by respiratory distress and CPAP  HPI Kathryn Hayes is a 83 y.o. female Patient comes via EMS from [facility]. EMS was called for respiratory distress. Patient had a temperature 101 she is placed on CPAP and felt better her O2 sats were 95% on CPAP and room air saturation was not noted by anyone. Patient is getting antibiotics of some kind for something but nobody could tell us what they were what they were for. Review of the records is not helping to determine that. She does have a history of CHF noted in the old records.   Past Medical History:  Diagnosis Date  . Anxiety    unspecified  . Arthritis   . Breast cancer ([place]) 2007   Early stage left breast cancer status post partial colectomy, 04/2005 (Dr. Oliva Bustard)  . Chest pain, unspecified   . CHF (congestive heart failure) ([place])   . Chronic headache   . Colon cancer ([place])    status post partial colectomy, diagnosed 2000  . Colon neoplasm   . COPD (chronic obstructive pulmonary disease) ([place])   . Depression    unspecified  . Diabetes mellitus   . Fibrocystic breast   . History of breast cancer   . History of colon cancer   . HTN (hypertension) with goal to be determined   . Hyperlipidemia   . Hyperlipidemia, unspecified   . Hypertension   . Obesity, unspecified   . Sleep apnea    mild to moderate    Patient Active Problem List   Diagnosis Date Noted  . Acute respiratory failure with hypoxia ([place]) 02/14/2017  . Dependence on supplemental oxygen 03/25/2016  . Primary central sleep apnea 03/25/2016  . Dependence on other enabling machines and devices 03/25/2016  . Presence of cardiac pacemaker  03/25/2016  . Bradycardia, sinus 03/25/2016  . Second degree AV block 03/25/2016  . Major depression, single episode 03/25/2016  . Anxiety disorder 03/25/2016  . Vitamin D deficiency 03/25/2016  . Insomnia 03/25/2016  . GERD (gastroesophageal reflux disease) 03/25/2016  . Syndrome of inappropriate antidiuretic hormone ([place]) 03/25/2016  . Migraine without status migrainosus, not intractable 03/25/2016  . Personal history of other malignant neoplasm of large intestine 03/25/2016  . Personal history of malignant neoplasm of breast 03/25/2016  . HTN (hypertension) 12/16/2015  . CHF (congestive heart failure) ([place]) 08/16/2015  . 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 Barrett's esophagus 01/05/2012  . Hypoxemic respiratory failure, chronic ([place]) 03/10/2011  . COPD (chronic obstructive pulmonary disease) ([place])   . Hypertensive heart disease with CHF (congestive heart failure) ([place])   . Hyperlipidemia, unspecified   . Type 2 diabetes, controlled, with neuropathy [facility])     Past Surgical History:  Procedure Laterality Date  . APPENDECTOMY    . BREAST LUMPECTOMY  2007   left  . BUNIONECTOMY Right   . CATARACT EXTRACTION W/ INTRAOCULAR LENS IMPLANT & ANTERIOR VITRECTOMY, BILATERAL Bilateral   . COLON SURGERY  1993  . COLOSTOMY    . DILATION AND CURETTAGE, DIAGNOSTIC / THERAPEUTIC     Fibroid removal  . INSERT REPLACE REMOVE PACEMAKER     ppm medtronic [id number] Adapta dula chamber rate responsive. 02/21/14  . MASTECTOMY PARTIAL / LUMPECTOMY    .  PARTIAL COLECTOMY     Abdominal & Transanal   . TONSILLECTOMY    . TOTAL KNEE ARTHROPLASTY Right 07/03/2013   Right total knee arthroplasty using computer assisted navigation  . TUBAL LIGATION Bilateral 1971   with questionable appendectomy    Prior to Admission medications   Medication Sig Start Date End Date Taking? Authorizing Provider  acetaminophen (TYLENOL) 325 MG tablet Take 650 mg by mouth every 4 (four) hours as needed for fever. Do not  exceed 3000 mg in 24hrs    [provider]  acetaminophen (TYLENOL) 500 MG tablet Take 1,000 mg by mouth daily. 5 pm    [provider]  albuterol (PROVENTIL) (2.5 MG/3ML) 0.083% nebulizer solution Take 2.5 mg by nebulization every 6 (six) hours as needed for wheezing or shortness of breath. For wheezing ,sob  per [facility], visit note 04/05/16    [provider]  alum & mag hydroxide-simeth (MAALOX PLUS) 400-400-40 MG/5ML suspension Take 30 mLs by mouth every 4 (four) hours as needed.     [provider]  Amino Acids-Protein Hydrolys (FEEDING SUPPLEMENT, PRO-STAT SUGAR FREE 64,) LIQD Take 30 mLs by mouth 2 (two) times daily between meals.     [provider]  aspirin EC 81 MG tablet Take 81 mg by mouth daily.    [provider]  Cholecalciferol (VITAMIN D3) 2000 units TABS Take 1 tablet by mouth daily.     [provider]  demeclocycline (DECLOMYCIN) 300 MG tablet Take 600 mg 2 (two) times daily by mouth. 2 tabs    [provider]  divalproex (DEPAKOTE SPRINKLE) 125 MG capsule Take 250 mg by mouth 2 (two) times daily.     [provider]  docusate sodium (COLACE) 100 MG capsule Take 100 mg by mouth daily.     [provider]  DULoxetine (CYMBALTA) 60 MG capsule Take 60 mg by mouth daily.     [provider]  fluticasone-salmeterol (ADVAIR HFA) 115-21 MCG/ACT inhaler Inhale 2 puffs into the lungs 2 (two) times daily. 8 am and 8 pm    [provider]  furosemide (LASIX) 20 MG tablet Take 20 mg by mouth daily.     [provider]  gabapentin (NEURONTIN) 400 MG capsule Take 400 mg by mouth 3 (three) times daily.     [provider]  glipiZIDE-metformin (METAGLIP) 2.5-500 MG tablet Take 1 tablet by mouth daily.     [provider]  GLUCERNA (GLUCERNA) LIQD Take 237 mLs by mouth 2 (two) times daily between meals.    [provider]  ibuprofen  (ADVIL,MOTRIN) 600 MG tablet Take 600 mg by mouth every 6 (six) hours as needed for headache.     [provider]  Ipratropium-Albuterol (COMBIVENT RESPIMAT) 20-100 MCG/ACT AERS respimat Inhale 1 puff into the lungs 4 (four) times daily.     [provider]  ketotifen (ZADITOR) 0.025 % ophthalmic solution Place 1 drop into both eyes 2 (two) times daily.     [provider]  LORazepam (ATIVAN) 0.5 MG tablet Take 1 tablet (0.5 mg total) by mouth at bedtime. 01/26/17   Toni Arthurs, NP  magnesium oxide (MAG-OX) 400 MG tablet Take 400 mg by mouth 2 (two) times daily.     [provider]  Melatonin 10 MG TABS Take 1 tablet by mouth at bedtime.    [provider]  meloxicam (MOBIC) 7.5 MG tablet Take 7.5 mg by mouth daily.  [provider]  Menthol 1 MG LOZG Use as directed 1 lozenge in the mouth or throat every 6 (six) hours as needed.    [provider]  Multiple Vitamins-Minerals (SENIOR TABS) TABS Take 1 tablet by mouth daily.    [provider]  nystatin (MYCOSTATIN/NYSTOP) powder Apply 1 application topically to abdominal folds daily for redness and irritation.    [provider]  OXYGEN Inhale 2 L into the lungs continuous. Titrate to maintain sats > 90 % as needed. Inhale at all times for COPD    [provider]  pantoprazole (PROTONIX) 20 MG tablet Take 20 mg by mouth daily.    [provider]  potassium chloride (K-DUR,KLOR-CON) 10 MEQ tablet Take 10 mEq by mouth every other day.     [provider]  pravastatin (PRAVACHOL) 10 MG tablet Take 10 mg by mouth once a week. On sunday    [provider]  senna-docusate (SENOKOT-S) 8.6-50 MG tablet Take 1 tablet by mouth 2 (two) times daily.     [provider]  Skin Protectants, Misc. (EUCERIN) cream Apply liberal amount topically to dry areas of skin daily after bath and as needed. Ok to keep at bedside    [provider]  sucralfate (CARAFATE) 1 G tablet Take 1 g by mouth daily. Tapering dose    [provider]  trandolapril (MAVIK) 2 MG tablet Take 2 mg by mouth daily.    [provider]  traZODone (DESYREL) 100 MG tablet Take 1 tablet (100 mg total) by mouth at bedtime. 08/25/15   Hillary Bow, MD    Allergies Patient has no known allergies.  Family History  Problem Relation Age of Onset  . Factor V Leiden deficiency Sister   . Colon polyps Sister   . Breast cancer Mother   . Colon cancer Mother   . Alzheimer's disease Mother   . Prostate cancer Father     Social History Social History   Tobacco Use  . Smoking status: Former Smoker    Packs/day: 1.50    Years: 50.00    Pack years: 75.00    Types: Cigarettes    Last attempt to quit: 10/05/2010    Years since quitting: 6.3  . Smokeless tobacco: Never Used  Substance Use Topics  . Alcohol use: No    Alcohol/week: 1.0 oz    Types: 2 Standard drinks or equivalent per week    Comment: Occasionally  . Drug use: Not on file    Review of Systems unable to obtain  ____________________________________________   PHYSICAL EXAM:  VITAL SIGNS: ED Triage Vitals  Enc Vitals Group     BP      Pulse      Resp      Temp      Temp src      SpO2      Weight      Height      Head Circumference      Peak Flow      Pain Score      Pain Loc      Pain Edu?      Excl. in [place]?     Constitutional: Alert and oriented.breathing hard Eyes: Conjunctivae are normal.  Head: Atraumatic. Nose: No congestion/rhinnorhea. Mouth/Throat: Mucous membranes are moist.  Oropharynx non-erythematous. Neck: No stridor.  no JVD Cardiovascular: Normal rate, regular rhythm. Grossly normal heart sounds.  Good peripheral circulation. Respiratory: Normal respiratory effort.  No retractions. Lungs  scattered crackles throughout Gastrointestinal: Soft and nontender. No distention. No abdominal bruits. No CVA  tenderness. Musculoskeletal: No lower extremity tenderness nor edema.   Neurologic: . No gross focal neurologic deficits are appreciated.  Skin:  Skin is warm, dry and intact. No rash noted.   ____________________________________________   LABS (all labs ordered are listed, but only abnormal results are displayed)  Labs Reviewed  BRAIN NATRIURETIC PEPTIDE - Abnormal; Notable for the following components:      Result Value   B Natriuretic Peptide 708.0 (*)    All other components within normal limits  CBC WITH DIFFERENTIAL/PLATELET - Abnormal; Notable for the following components:   WBC 11.9 (*)    RDW 14.8 (*)    Neutro Abs 10.8 (*)    Lymphs Abs 0.7 (*)    All other components within normal limits  URINALYSIS, COMPLETE (UACMP) WITH MICROSCOPIC - Abnormal; Notable for the following components:   Color, Urine YELLOW (*)    APPearance HAZY (*)    Hgb urine dipstick SMALL (*)    Squamous Epithelial / LPF 0-5 (*)    All other components within normal limits  COMPREHENSIVE METABOLIC PANEL - Abnormal; Notable for the following components:   Sodium 120 (*)    Chloride 75 (*)    CO2 33 (*)    Glucose, Bld 214 (*)    Calcium 8.8 (*)    Total Protein 6.1 (*)    Albumin 2.8 (*)    All other components within normal limits  BLOOD GAS, ARTERIAL - Abnormal; Notable for the following components:   pCO2 arterial 61 (*)    pO2, Arterial 66 (*)    Bicarbonate 38.7 (*)    Acid-Base Excess 11.8 (*)    All other components within normal limits  CULTURE, BLOOD (ROUTINE X 2)  CULTURE, BLOOD (ROUTINE X 2)  URINE CULTURE  LACTIC ACID, PLASMA  INFLUENZA PANEL BY PCR (TYPE A & B)  TROPONIN I   ____________________________________________  EKG  EKG is a fully paced rhythm ____________________________________________  RADIOLOGY  ED MD interpretation: radiology reads retrocardiac atelectasis there does seem to be something behind there.  Official radiology report(s): Dg Chest Portable  1 View  Result Date: 02/14/2017 CLINICAL DATA:  83 year old female with a history of respiratory distress EXAM: PORTABLE CHEST 1 VIEW COMPARISON:  02/14/2017, 09/22/2015 FINDINGS: Cardiomediastinal silhouette unchanged with cardiomegaly. Calcifications of the aortic arch. No evidence of interlobular septal thickening. No confluent airspace disease. No pneumothorax. No pleural effusion. Left chest wall cardiac pacing device. Coarsened interstitial markings. IMPRESSION: Chronic lung changes without evidence of superimposed acute cardiopulmonary disease. Cardiomegaly. Unchanged cardiac pacing device. Electronically Signed   By: Corrie Mckusick D.O.   On: 02/14/2017 21:05   Dg Chest Portable 1 View  Result Date: 02/14/2017 CLINICAL DATA:  Respiratory distress EXAM: PORTABLE CHEST 1 VIEW COMPARISON:  9187 chest radiograph. FINDINGS: Right rotated chest radiographs. Stable configuration of 2 lead left subclavian pacemaker. Stable cardiomediastinal silhouette with mild cardiomegaly. No pneumothorax. No pleural effusion. No overt pulmonary edema. Left retrocardiac atelectasis. IMPRESSION: Stable mild cardiomegaly without overt pulmonary edema. Left retrocardiac atelectasis. Electronically Signed   By: Ilona Sorrel M.D.   On: 02/14/2017 19:39    ____________________________________________   PROCEDURES  Procedure(s) performed:   Procedures  Critical Care performed: critical care time one hour including bedside care what her blood pressure drops. ____________________________________________   INITIAL IMPRESSION / ASSESSMENT AND PLAN / ED COURSE patient doing well awake alert and leave the room to  see one of the patient come back to check on her blood pressures gone from 1:30 down into the 70s. Patient is now minimally responsive blood gas looks okay CO2 and O2 in the 60s with pH is normal. Chest x-ray was repeated and shows no change. Belly is soft. Sodium comes back at 120 slightly lower than earlier  today. We give her some fluids in an attempt to raise her blood pressure. O2 sats are still 90-94 range. Heart rate had gone up from 63-114 but is back down to 106105. Dr. Bridgett Larsson at the bedside.he is assuming care will get her upstairs and stepped down. She has had Zosyn already         ____________________________________________   FINAL CLINICAL IMPRESSION(S) / ED DIAGNOSES  Final diagnoses:  COPD exacerbation ([place])  Hypotension, unspecified hypotension type  Altered mental status, unspecified altered mental status type  Hyponatremia     ED Discharge Orders    None       Note:  This document was prepared using Dragon voice recognition software and may include unintentional dictation errors.    Nena Polio, MD 02/14/17 2130

## 2017-02-14 NOTE — Progress Notes (Signed)
[facility] Progress Note Patient Name: Kathryn Hayes DOB: 1934-03-07 MRN: [id number]   Date of Service  02/14/2017  HPI/Events of Note  84 female resident of NH. Presents with Fever, SOB. Placed on BiPAP --> ABG = 7.49/61/66. Now hypotensive and Na+ = 120. PCCM asked to assume care in ICU. VSS.   eICU Interventions  No new orders.      Intervention Category Evaluation Type: New Patient Evaluation  Lysle Dingwall 02/14/2017, 10:45 PM

## 2017-02-14 NOTE — ED Notes (Signed)
Attempted to call report

## 2017-02-14 NOTE — H&P (Signed)
[facility] [place] NAME: Kathryn Hayes    MR#:  [id number]  DATE OF BIRTH:  [birth year]  DATE OF ADMISSION:  02/14/2017  PRIMARY CARE PHYSICIAN: Juluis Pitch, MD   REQUESTING/REFERRING PHYSICIAN: Dr. Rip Harbour  CHIEF COMPLAINT:   Chief Complaint  Patient presents with  . Respiratory Distress   Fever and the shortness of breath today HISTORY OF PRESENT ILLNESS:  Kathryn Hayes  is a 83 y.o. female with a known history of multiple medical problems as below.  Patient was sent from SNF to the ED due to above chief complaints.  The patient had a temperature 101 and placed on CPAP due to shortness of breath.  According to her daughter, the patient has chronic respiratory failure on home oxygen 2 L.  She was found hypoxia, hypotension, tachycardia, leukocytosis and mental status change in the ED. her blood pressure decreased to 72/44. PO2 66, PCO2 61.  But the chest x-ray did not show any acute cardiopulmonary disease.  Urinalysis is unremarkable.  Her sodium is low at 120.  She is being treated with normal saline bolus and IV zosyn.  She is put on BiPAP. PAST MEDICAL HISTORY:   Past Medical History:  Diagnosis Date  . Anxiety    unspecified  . Arthritis   . Breast cancer ([place]) 2007   Early stage left breast cancer status post partial colectomy, 04/2005 (Dr. Oliva Bustard)  . Chest pain, unspecified   . CHF (congestive heart failure) ([place])   . Chronic headache   . Colon cancer ([place])    status post partial colectomy, diagnosed 2000  . Colon neoplasm   . COPD (chronic obstructive pulmonary disease) ([place])   . Depression    unspecified  . Diabetes mellitus   . Fibrocystic breast   . History of breast cancer   . History of colon cancer   . HTN (hypertension) with goal to be determined   . Hyperlipidemia   . Hyperlipidemia, unspecified   . Hypertension   . Obesity, unspecified   . Sleep apnea    mild to moderate    PAST SURGICAL HISTORY:    Past Surgical History:  Procedure Laterality Date  . APPENDECTOMY    . BREAST LUMPECTOMY  2007   left  . BUNIONECTOMY Right   . CATARACT EXTRACTION W/ INTRAOCULAR LENS IMPLANT & ANTERIOR VITRECTOMY, BILATERAL Bilateral   . COLON SURGERY  1993  . COLOSTOMY    . DILATION AND CURETTAGE, DIAGNOSTIC / THERAPEUTIC     Fibroid removal  . INSERT REPLACE REMOVE PACEMAKER     ppm medtronic [id number] Adapta dula chamber rate responsive. 02/21/14  . MASTECTOMY PARTIAL / LUMPECTOMY    . PARTIAL COLECTOMY     Abdominal & Transanal   . TONSILLECTOMY    . TOTAL KNEE ARTHROPLASTY Right 07/03/2013   Right total knee arthroplasty using computer assisted navigation  . TUBAL LIGATION Bilateral 1971   with questionable appendectomy    SOCIAL HISTORY:   Social History   Tobacco Use  . Smoking status: Former Smoker    Packs/day: 1.50    Years: 50.00    Pack years: 75.00    Types: Cigarettes    Last attempt to quit: 10/05/2010    Years since quitting: 6.3  . Smokeless tobacco: Never Used  Substance Use Topics  . Alcohol use: No    Alcohol/week: 1.0 oz    Types: 2 Standard drinks or equivalent per week    Comment:  Occasionally    FAMILY HISTORY:   Family History  Problem Relation Age of Onset  . Factor V Leiden deficiency Sister   . Colon polyps Sister   . Breast cancer Mother   . Colon cancer Mother   . Alzheimer's disease Mother   . Prostate cancer Father     DRUG ALLERGIES:  No Known Allergies  REVIEW OF SYSTEMS:   Review of Systems  Unable to perform ROS: Mental status change    MEDICATIONS AT HOME:   Prior to Admission medications   Medication Sig Start Date End Date Taking? Authorizing Provider  acetaminophen (TYLENOL) 325 MG tablet Take 650 mg by mouth every 4 (four) hours as needed for fever. Do not exceed 3000 mg in 24hrs    [provider]  acetaminophen (TYLENOL) 500 MG tablet Take 1,000 mg by mouth daily. 5 pm    [provider]  albuterol  (PROVENTIL) (2.5 MG/3ML) 0.083% nebulizer solution Take 2.5 mg by nebulization every 6 (six) hours as needed for wheezing or shortness of breath. For wheezing ,sob  per [facility], visit note 04/05/16    [provider]  alum & mag hydroxide-simeth (MAALOX PLUS) 400-400-40 MG/5ML suspension Take 30 mLs by mouth every 4 (four) hours as needed.     [provider]  Amino Acids-Protein Hydrolys (FEEDING SUPPLEMENT, PRO-STAT SUGAR FREE 64,) LIQD Take 30 mLs by mouth 2 (two) times daily between meals.     [provider]  aspirin EC 81 MG tablet Take 81 mg by mouth daily.    [provider]  Cholecalciferol (VITAMIN D3) 2000 units TABS Take 1 tablet by mouth daily.     [provider]  demeclocycline (DECLOMYCIN) 300 MG tablet Take 600 mg 2 (two) times daily by mouth. 2 tabs    [provider]  divalproex (DEPAKOTE SPRINKLE) 125 MG capsule Take 250 mg by mouth 2 (two) times daily.     [provider]  docusate sodium (COLACE) 100 MG capsule Take 100 mg by mouth daily.     [provider]  DULoxetine (CYMBALTA) 60 MG capsule Take 60 mg by mouth daily.     [provider]  fluticasone-salmeterol (ADVAIR HFA) 115-21 MCG/ACT inhaler Inhale 2 puffs into the lungs 2 (two) times daily. 8 am and 8 pm    [provider]  furosemide (LASIX) 20 MG tablet Take 20 mg by mouth daily.     [provider]  gabapentin (NEURONTIN) 400 MG capsule Take 400 mg by mouth 3 (three) times daily.     [provider]  glipiZIDE-metformin (METAGLIP) 2.5-500 MG tablet Take 1 tablet by mouth daily.     [provider]  GLUCERNA (GLUCERNA) LIQD Take 237 mLs by mouth 2 (two) times daily between meals.    [provider]  ibuprofen (ADVIL,MOTRIN) 600 MG tablet Take 600 mg by mouth every 6 (six) hours as needed for headache.     [provider]  Ipratropium-Albuterol (COMBIVENT RESPIMAT) 20-100  MCG/ACT AERS respimat Inhale 1 puff into the lungs 4 (four) times daily.     [provider]  ketotifen (ZADITOR) 0.025 % ophthalmic solution Place 1 drop into both eyes 2 (two) times daily.     [provider]  LORazepam (ATIVAN) 0.5 MG tablet Take 1 tablet (0.5 mg total) by mouth at bedtime. 01/26/17   Toni Arthurs, NP  magnesium oxide (MAG-OX) 400 MG tablet Take 400 mg by mouth 2 (two) times  daily.     [provider]  Melatonin 10 MG TABS Take 1 tablet by mouth at bedtime.    [provider]  meloxicam (MOBIC) 7.5 MG tablet Take 7.5 mg by mouth daily.     [provider]  Menthol 1 MG LOZG Use as directed 1 lozenge in the mouth or throat every 6 (six) hours as needed.    [provider]  Multiple Vitamins-Minerals (SENIOR TABS) TABS Take 1 tablet by mouth daily.    [provider]  nystatin (MYCOSTATIN/NYSTOP) powder Apply 1 application topically to abdominal folds daily for redness and irritation.    [provider]  OXYGEN Inhale 2 L into the lungs continuous. Titrate to maintain sats > 90 % as needed. Inhale at all times for COPD    [provider]  pantoprazole (PROTONIX) 20 MG tablet Take 20 mg by mouth daily.    [provider]  potassium chloride (K-DUR,KLOR-CON) 10 MEQ tablet Take 10 mEq by mouth every other day.     [provider]  pravastatin (PRAVACHOL) 10 MG tablet Take 10 mg by mouth once a week. On sunday    [provider]  senna-docusate (SENOKOT-S) 8.6-50 MG tablet Take 1 tablet by mouth 2 (two) times daily.     [provider]  Skin Protectants, Misc. (EUCERIN) cream Apply liberal amount topically to dry areas of skin daily after bath and as needed. Ok to keep at bedside    [provider]  sucralfate (CARAFATE) 1 G tablet Take 1 g by mouth daily. Tapering dose    [provider]  trandolapril (MAVIK) 2 MG tablet Take 2 mg by mouth daily.     [provider]  traZODone (DESYREL) 100 MG tablet Take 1 tablet (100 mg total) by mouth at bedtime. 08/25/15   Hillary Bow, MD      VITAL SIGNS:  Blood pressure (!) 81/44, pulse (!) 106, temperature 97.6 F (36.4 C), temperature source Oral, resp. rate 18, height 5\' 4"  (1.626 m), weight 215 lb (97.5 kg), SpO2 94 %.  PHYSICAL EXAMINATION:  Physical Exam  GENERAL:  83 y.o.-year-old patient lying in the bed with no acute distress.  Obesity. EYES: Pupils equal, round, reactive to light and accommodation. No scleral icterus. Extraocular muscles intact.  HEENT: Head atraumatic, normocephalic.  NECK:  Supple, no jugular venous distention. No thyroid enlargement, no tenderness.  LUNGS: Normal breath sounds bilaterally, no wheezing, rales,rhonchi or crepitation. No use of accessory muscles of respiration.  CARDIOVASCULAR: S1, S2 normal. No murmurs, rubs, or gallops.  ABDOMEN: Soft, nontender, nondistended. Bowel sounds present. No organomegaly or mass.  EXTREMITIES: No pedal edema, cyanosis, or clubbing.  NEUROLOGIC: Unable to exam. PSYCHIATRIC: The patient is unresponsive to verbal stimuli. SKIN: No obvious rash, lesion, or ulcer.  Poor skin turgor.  LABORATORY PANEL:   CBC Recent Labs  Lab 02/14/17 1923  WBC 11.9*  HGB 12.1  HCT 36.4  PLT 313   ------------------------------------------------------------------------------------------------------------------  Chemistries  Recent Labs  Lab 02/14/17 2008  NA 120*  K 4.7  CL 75*  CO2 33*  GLUCOSE 214*  BUN 18  CREATININE 0.59  CALCIUM 8.8*  AST 28  ALT 23  ALKPHOS 51  BILITOT 0.7   ------------------------------------------------------------------------------------------------------------------  Cardiac Enzymes Recent Labs  Lab 02/14/17 2008  TROPONINI <0.03   ------------------------------------------------------------------------------------------------------------------  RADIOLOGY:  Dg Chest  Portable 1 View  Result Date: 02/14/2017 CLINICAL DATA:  83 year old female with a history of respiratory distress EXAM:  PORTABLE CHEST 1 VIEW COMPARISON:  02/14/2017, 09/22/2015 FINDINGS: Cardiomediastinal silhouette unchanged with cardiomegaly. Calcifications of the aortic arch. No evidence of interlobular septal thickening. No confluent airspace disease. No pneumothorax. No pleural effusion. Left chest wall cardiac pacing device. Coarsened interstitial markings. IMPRESSION: Chronic lung changes without evidence of superimposed acute cardiopulmonary disease. Cardiomegaly. Unchanged cardiac pacing device. Electronically Signed   By: Corrie Mckusick D.O.   On: 02/14/2017 21:05   Dg Chest Portable 1 View  Result Date: 02/14/2017 CLINICAL DATA:  Respiratory distress EXAM: PORTABLE CHEST 1 VIEW COMPARISON:  9187 chest radiograph. FINDINGS: Right rotated chest radiographs. Stable configuration of 2 lead left subclavian pacemaker. Stable cardiomediastinal silhouette with mild cardiomegaly. No pneumothorax. No pleural effusion. No overt pulmonary edema. Left retrocardiac atelectasis. IMPRESSION: Stable mild cardiomegaly without overt pulmonary edema. Left retrocardiac atelectasis. Electronically Signed   By: Ilona Sorrel M.D.   On: 02/14/2017 19:39      IMPRESSION AND PLAN:   Hypotension, possible due to septic shock. Acute respiratory failure with hypoxia and hypercapnia  The patient will be admitted to stepdown unit. Continue BiPAP, DuoNeb and intensivist consult. She is suspected sepsis but no more clear source of infection. Continue Zosyn and vancomycin pharmacy to dose, follow-up CBC and cultures. Normal saline bolus, Levophed as needed.  Hyponatremia.  Start normal saline IV and follow-up BMP.  Acute metabolic encephalopathy due to above. History of chronic diastolic CHF with ejection fraction 40-50%.  Stable.  Hold Lasix due to hypotension.  Diabetes.  Start sliding scale. Morbid  obesity  Discussed with [facility] intensivist, Dr. Corinna Lines. All the records are reviewed and case discussed with ED provider. Management plans discussed with the patient's daughter and son and they are in agreement.  CODE STATUS: Full Code TOTAL CRITICAL TIME TAKING CARE OF THIS PATIENT: 62 minutes.    Demetrios Loll M.D on 02/14/2017 at 9:27 PM  Between 7am to 6pm - Pager - [phone]  After 6pm go to [email or web address] - Proofreader  [facility]  Office  [phone]  CC: Primary care physician; Juluis Pitch, MD   Note: This dictation was prepared with Dragon dictation along with smaller phrase technology. Any transcriptional errors that result from this process are unin

## 2017-02-14 NOTE — ED Triage Notes (Signed)
Pt arrives via [facility] from [facility] in respiratory distress. Per EMS, when they arrived they heard rales upon entering room. Pt had fever 101 via EMS. Placed on CPAP with relief; 95% on CPAP. Room air sat unknown. Fire placed pt on [place] upon their arrival.

## 2017-02-14 NOTE — Progress Notes (Signed)
Transported pt to ICU 11 on Bipap without incident. Pt remains on Bipap and tol well at this time. Report given to ICU RT.

## 2017-02-15 ENCOUNTER — Inpatient Hospital Stay: Payer: Medicare Other

## 2017-02-15 ENCOUNTER — Other Ambulatory Visit: Payer: Self-pay

## 2017-02-15 DIAGNOSIS — J441 Chronic obstructive pulmonary disease with (acute) exacerbation: Secondary | ICD-10-CM

## 2017-02-15 DIAGNOSIS — A419 Sepsis, unspecified organism: Principal | ICD-10-CM

## 2017-02-15 LAB — BLOOD CULTURE ID PANEL (REFLEXED)
Acinetobacter baumannii: NOT DETECTED
CANDIDA KRUSEI: NOT DETECTED
CANDIDA PARAPSILOSIS: NOT DETECTED
CANDIDA TROPICALIS: NOT DETECTED
Candida albicans: NOT DETECTED
Candida glabrata: NOT DETECTED
Carbapenem resistance: NOT DETECTED
Enterobacter cloacae complex: NOT DETECTED
Enterobacteriaceae species: NOT DETECTED
Enterococcus species: NOT DETECTED
Escherichia coli: NOT DETECTED
Haemophilus influenzae: NOT DETECTED
KLEBSIELLA OXYTOCA: NOT DETECTED
KLEBSIELLA PNEUMONIAE: NOT DETECTED
Listeria monocytogenes: NOT DETECTED
Methicillin resistance: DETECTED — AB
Neisseria meningitidis: NOT DETECTED
PROTEUS SPECIES: NOT DETECTED
PSEUDOMONAS AERUGINOSA: NOT DETECTED
STAPHYLOCOCCUS AUREUS BCID: NOT DETECTED
STAPHYLOCOCCUS SPECIES: DETECTED — AB
STREPTOCOCCUS AGALACTIAE: NOT DETECTED
STREPTOCOCCUS PNEUMONIAE: NOT DETECTED
Serratia marcescens: NOT DETECTED
Streptococcus pyogenes: NOT DETECTED
Streptococcus species: NOT DETECTED
Vancomycin resistance: NOT DETECTED

## 2017-02-15 LAB — BASIC METABOLIC PANEL
Anion gap: 8 (ref 5–15)
BUN: 14 mg/dL (ref 6–20)
CALCIUM: 8.3 mg/dL — AB (ref 8.9–10.3)
CO2: 30 mmol/L (ref 22–32)
CREATININE: 0.52 mg/dL (ref 0.44–1.00)
Chloride: 84 mmol/L — ABNORMAL LOW (ref 101–111)
GFR calc Af Amer: >60 mL/min (ref >60)
GFR calc non Af Amer: >60 mL/min (ref >60)
GLUCOSE: 208 mg/dL — AB (ref 65–99)
Potassium: 5.3 mmol/L — ABNORMAL HIGH (ref 3.5–5.1)
Sodium: 122 mmol/L — ABNORMAL LOW (ref 135–145)

## 2017-02-15 LAB — CBC
HCT: 31.6 % — ABNORMAL LOW (ref 35.0–47.0)
Hemoglobin: 10.7 g/dL — ABNORMAL LOW (ref 12.0–16.0)
MCH: 29.2 pg (ref 26.0–34.0)
MCHC: 33.7 g/dL (ref 32.0–36.0)
MCV: 86.4 fL (ref 80.0–100.0)
PLATELETS: 250 10*3/uL (ref 150–440)
RBC: 3.66 MIL/uL — ABNORMAL LOW (ref 3.80–5.20)
RDW: 14.7 % — ABNORMAL HIGH (ref 11.5–14.5)
WBC: 8.5 10*3/uL (ref 3.6–11.0)

## 2017-02-15 LAB — PROTIME-INR
INR: 0.99
Prothrombin Time: 13 seconds (ref 11.4–15.2)

## 2017-02-15 LAB — MRSA PCR SCREENING: MRSA by PCR: NEGATIVE

## 2017-02-15 LAB — GLUCOSE, CAPILLARY
GLUCOSE-CAPILLARY: 114 mg/dL — AB (ref 65–99)
Glucose-Capillary: 220 mg/dL — ABNORMAL HIGH (ref 65–99)
Glucose-Capillary: 199 mg/dL — ABNORMAL HIGH (ref 65–99)

## 2017-02-15 LAB — PROCALCITONIN
PROCALCITONIN: 0.1 ng/mL
PROCALCITONIN: 0.12 ng/mL

## 2017-02-15 LAB — APTT: aPTT: 30 seconds (ref 24–36)

## 2017-02-15 MED ORDER — MAGNESIUM SULFATE 2 GM/50ML IV SOLN
2.0000 g | Freq: Once | INTRAVENOUS | Status: AC
  Administered 2017-02-15: 2 g via INTRAVENOUS
  Filled 2017-02-15: qty 50

## 2017-02-15 MED ORDER — FUROSEMIDE 20 MG PO TABS
20.0000 mg | ORAL_TABLET | Freq: Every day | ORAL | Status: DC
  Administered 2017-02-15 – 2017-02-18 (×4): 20 mg via ORAL
  Filled 2017-02-15 (×4): qty 1

## 2017-02-15 MED ORDER — VANCOMYCIN HCL IN DEXTROSE 1-5 GM/200ML-% IV SOLN
1000.0000 mg | Freq: Two times a day (BID) | INTRAVENOUS | Status: DC
  Administered 2017-02-15: 1000 mg via INTRAVENOUS
  Filled 2017-02-15 (×2): qty 200

## 2017-02-15 MED ORDER — PIPERACILLIN-TAZOBACTAM 3.375 G IVPB
3.3750 g | Freq: Three times a day (TID) | INTRAVENOUS | Status: DC
  Administered 2017-02-15: 3.375 g via INTRAVENOUS
  Filled 2017-02-15: qty 50

## 2017-02-15 MED ORDER — IPRATROPIUM-ALBUTEROL 0.5-2.5 (3) MG/3ML IN SOLN
3.0000 mL | RESPIRATORY_TRACT | Status: DC
  Administered 2017-02-15 – 2017-02-18 (×18): 3 mL via RESPIRATORY_TRACT
  Filled 2017-02-15 (×20): qty 3

## 2017-02-15 MED ORDER — DULOXETINE HCL 30 MG PO CPEP
60.0000 mg | ORAL_CAPSULE | Freq: Every day | ORAL | Status: DC
  Administered 2017-02-16 – 2017-02-18 (×3): 60 mg via ORAL
  Filled 2017-02-15 (×3): qty 2

## 2017-02-15 MED ORDER — AZITHROMYCIN 500 MG PO TABS
500.0000 mg | ORAL_TABLET | Freq: Once | ORAL | Status: AC
  Administered 2017-02-15: 500 mg via ORAL
  Filled 2017-02-15: qty 1

## 2017-02-15 MED ORDER — LORAZEPAM 0.5 MG PO TABS: 0.5000 mg | ORAL_TABLET | Freq: Two times a day (BID) | ORAL | Status: DC | PRN

## 2017-02-15 MED ORDER — MENTHOL 3 MG MT LOZG
1.0000 | LOZENGE | OROMUCOSAL | Status: DC | PRN
  Administered 2017-02-15 – 2017-02-16 (×2): 3 mg via ORAL
  Filled 2017-02-15 (×2): qty 9

## 2017-02-15 MED ORDER — METHYLPREDNISOLONE SODIUM SUCC 40 MG IJ SOLR
20.0000 mg | Freq: Two times a day (BID) | INTRAMUSCULAR | Status: DC
  Administered 2017-02-15 – 2017-02-17 (×5): 20 mg via INTRAVENOUS
  Filled 2017-02-15 (×5): qty 1

## 2017-02-15 MED ORDER — AZITHROMYCIN 250 MG PO TABS
250.0000 mg | ORAL_TABLET | Freq: Every day | ORAL | Status: DC
  Administered 2017-02-16: 250 mg via ORAL
  Filled 2017-02-15: qty 1

## 2017-02-15 MED ORDER — PANTOPRAZOLE SODIUM 40 MG PO TBEC
40.0000 mg | DELAYED_RELEASE_TABLET | Freq: Every day | ORAL | Status: DC
  Administered 2017-02-15 – 2017-02-18 (×4): 40 mg via ORAL
  Filled 2017-02-15 (×4): qty 1

## 2017-02-15 MED ORDER — MELATONIN 5 MG PO TABS
5.0000 mg | ORAL_TABLET | Freq: Every evening | ORAL | Status: DC | PRN
  Filled 2017-02-15: qty 1

## 2017-02-15 MED ORDER — BUDESONIDE 0.5 MG/2ML IN SUSP
0.5000 mg | Freq: Two times a day (BID) | RESPIRATORY_TRACT | Status: DC
  Administered 2017-02-15 – 2017-02-18 (×6): 0.5 mg via RESPIRATORY_TRACT
  Filled 2017-02-15 (×7): qty 2

## 2017-02-15 MED ORDER — LORAZEPAM 0.5 MG PO TABS
0.5000 mg | ORAL_TABLET | Freq: Every evening | ORAL | Status: DC | PRN
  Administered 2017-02-15 – 2017-02-16 (×2): 0.5 mg via ORAL
  Filled 2017-02-15 (×2): qty 1

## 2017-02-15 MED ORDER — IBUPROFEN 400 MG PO TABS
200.0000 mg | ORAL_TABLET | Freq: Three times a day (TID) | ORAL | Status: DC | PRN
  Administered 2017-02-15: 200 mg via ORAL
  Filled 2017-02-15: qty 1

## 2017-02-15 MED ORDER — GUAIFENESIN-DM 100-10 MG/5ML PO SYRP
5.0000 mL | ORAL_SOLUTION | ORAL | Status: DC | PRN
  Administered 2017-02-15 – 2017-02-17 (×3): 5 mL via ORAL
  Filled 2017-02-15 (×4): qty 5

## 2017-02-15 NOTE — Progress Notes (Signed)
Pharmacy Antibiotic Note  Kathryn Hayes is a 83 y.o. female admitted on 02/14/2017 with pneumonia.  Pharmacy has been consulted for vanc/zosyn dosing.  Plan: Patient received vanc 1g and zosyn 3.375g IV x 1   Will continue w/ vanc 1g IV q12h w/ 6 hrs stack dose Will draw a trough 02/13 @ 1700 prior to 4th dose. Will continue zosyn 3.375g IV q8h  Ke 0.0571 T1/2 12 hrs Goal trough 15 - 20 mcg/mL  Height: 5\' 4"  (162.6 cm) Weight: 215 lb (97.5 kg) IBW/kg (Calculated) : 54.7  Temp (24hrs), Avg:97.4 F (36.3 C), Min:97.1 F (36.2 C), Max:97.6 F (36.4 C)  Recent Labs  Lab 02/14/17 1220 02/14/17 1923 02/14/17 1925 02/14/17 2008  WBC 10.8 11.9*  --   --   CREATININE 0.47  --   --  0.59  LATICACIDVEN  --   --  1.9  --     Estimated Creatinine Clearance: 63.6 mL/min (by C-G formula based on SCr of 0.59 mg/dL).    No Known Allergies  Thank you for allowing pharmacy to be a part of this patient's care.  Tobie Lords, PharmD, BCPS Clinical Pharmacist 02/15/2017

## 2017-02-15 NOTE — Progress Notes (Signed)
[facility] [place] NAME: Kathryn Hayes    MR#:  [id number]  DATE OF BIRTH:  1934-10-21  SUBJECTIVE:   Patient here due to shortness of breath and noted to be in acute on chronic respiratory failure with hypoxia and hypercapnia secondary to COPD exacerbation. Patient was also initially hypotensive but did not require pressors. Patient is now weaned off the BiPAP and much improved since yesterday.  REVIEW OF SYSTEMS:    Review of Systems  Constitutional: Negative for chills and fever.  HENT: Negative for congestion and tinnitus.   Eyes: Negative for blurred vision and double vision.  Respiratory: Negative for cough, shortness of breath and wheezing.   Cardiovascular: Negative for chest pain, orthopnea and PND.  Gastrointestinal: Negative for abdominal pain, diarrhea, nausea and vomiting.  Genitourinary: Negative for dysuria and hematuria.  Neurological: Negative for dizziness, sensory change and focal weakness.  All other systems reviewed and are negative.   Nutrition: Soft diet Tolerating Diet: Yes Tolerating PT: Await Eval.    DRUG ALLERGIES:  No Known Allergies  VITALS:  Blood pressure 126/71, pulse (!) 104, temperature (!) 97.5 F (36.4 C), temperature source Axillary, resp. rate (!) 22, height 5\' 4"  (1.626 m), weight 97.5 kg (215 lb), SpO2 93 %.  PHYSICAL EXAMINATION:   Physical Exam  GENERAL:  83 y.o.-year-old obese patient lying in bed in no acute distress.  EYES: Pupils equal, round, reactive to light and accommodation. No scleral icterus. Extraocular muscles intact.  HEENT: Head atraumatic, normocephalic. Oropharynx and nasopharynx clear.  NECK:  Supple, no jugular venous distention. No thyroid enlargement, no tenderness.  LUNGS: Prolonged Insp. & Exp. phase, no wheezing, rales, rhonchi. No use of accessory muscles of respiration.  CARDIOVASCULAR: S1, S2 normal. No murmurs, rubs, or gallops.  ABDOMEN: Soft, nontender,  nondistended. Bowel sounds present. No organomegaly or mass.  EXTREMITIES: No cyanosis, clubbing or edema b/l.    NEUROLOGIC: Cranial nerves II through XII are intact. No focal Motor or sensory deficits b/l.  Globally weak.  PSYCHIATRIC: The patient is alert and oriented x 3.  SKIN: No obvious rash, lesion, or ulcer.    LABORATORY PANEL:   CBC Recent Labs  Lab 02/15/17 0631  WBC 8.5  HGB 10.7*  HCT 31.6*  PLT 250   ------------------------------------------------------------------------------------------------------------------  Chemistries  Recent Labs  Lab 02/14/17 2008 02/14/17 2240 02/15/17 0631  NA 120*  --  122*  K 4.7  --  5.3*  CL 75*  --  84*  CO2 33*  --  30  GLUCOSE 214*  --  208*  BUN 18  --  14  CREATININE 0.59  --  0.52  CALCIUM 8.8*  --  8.3*  MG  --  1.6*  --   AST 28  --   --   ALT 23  --   --   ALKPHOS 51  --   --   BILITOT 0.7  --   --    ------------------------------------------------------------------------------------------------------------------  Cardiac Enzymes Recent Labs  Lab 02/14/17 2008  TROPONINI <0.03   ------------------------------------------------------------------------------------------------------------------  RADIOLOGY:  Dg Chest Port 1 View  Result Date: 02/15/2017 CLINICAL DATA:  Acute respiratory failure. EXAM: PORTABLE CHEST 1 VIEW COMPARISON:  02/14/2017 FINDINGS: The patient is rotated to the right. A dual lead pacemaker remains in place. The cardiac silhouette remains enlarged. Thoracic aortic atherosclerosis and tortuosity are again noted. There is mild left basilar atelectasis. No lobar consolidation, overt pulmonary edema, sizable pleural effusion, or  pneumothorax is identified. IMPRESSION: Cardiomegaly and mild left basilar atelectasis. Electronically Signed   By: Logan Bores M.D.   On: 02/15/2017 06:44   Dg Chest Portable 1 View  Result Date: 02/14/2017 CLINICAL DATA:  83 year old female with a history of  respiratory distress EXAM: PORTABLE CHEST 1 VIEW COMPARISON:  02/14/2017, 09/22/2015 FINDINGS: Cardiomediastinal silhouette unchanged with cardiomegaly. Calcifications of the aortic arch. No evidence of interlobular septal thickening. No confluent airspace disease. No pneumothorax. No pleural effusion. Left chest wall cardiac pacing device. Coarsened interstitial markings. IMPRESSION: Chronic lung changes without evidence of superimposed acute cardiopulmonary disease. Cardiomegaly. Unchanged cardiac pacing device. Electronically Signed   By: Corrie Mckusick D.O.   On: 02/14/2017 21:05   Dg Chest Portable 1 View  Result Date: 02/14/2017 CLINICAL DATA:  Respiratory distress EXAM: PORTABLE CHEST 1 VIEW COMPARISON:  9187 chest radiograph. FINDINGS: Right rotated chest radiographs. Stable configuration of 2 lead left subclavian pacemaker. Stable cardiomediastinal silhouette with mild cardiomegaly. No pneumothorax. No pleural effusion. No overt pulmonary edema. Left retrocardiac atelectasis. IMPRESSION: Stable mild cardiomegaly without overt pulmonary edema. Left retrocardiac atelectasis. Electronically Signed   By: Ilona Sorrel M.D.   On: 02/14/2017 19:39     ASSESSMENT AND PLAN:   83 year old female with past medical history obstructive sleep apnea, obesity, hypertension, hyperlipidemia, history of colon cancer breast cancer diabetes COPD depression who presents to the hospital due to shortness of breath and noted to be in acute on chronic respiratory failure with hypoxia and hypercapnia.  1. Acute on chronic respiratory failure with hypoxia and hypercapnia-secondary to COPD exacerbation. -Initially patient was on BiPAP but now has been weaned off of it. -Continue O2 supplementation, CPAP at bedtime. Continuous steroids, DuoNeb nebs, Pulmicort nebs and Z-Pak.  2. COPD exacerbation-this was the cause of patient's worsening respiratory failure. -Much improved since yesterday as patient has been weaned off  BiPAP. Continue IV steroids, scheduled DuoNeb's, Pulmicort and a Z-Pak. Chest x-ray was negative for acute pathology.  3. Hyponatremia-etiology unclear. Questionable SIADH related to pulmonary process/volume depletion. -Continue gentle IV fluids, Lasix. Follow sodium which is improving. Patient is clinically asymptomatic.  4. GERD-continue Protonix.  5. Hypomagnesemia-patient given IV mag sulfate. Repeat level in the morning.  6. Depression- continue Cymbalta.  7. Hyperglycemia - due to steroids.  Cont. SSI.   Transfer to floor.   All the records are reviewed and case discussed with Care Management/Social Worker. Management plans discussed with the patient, family and they are in agreement.  CODE STATUS: Full code  DVT Prophylaxis: Lovenox  TOTAL TIME TAKING CARE OF THIS PATIENT: 30 minutes.   POSSIBLE D/C IN 1-2 DAYS, DEPENDING ON CLINICAL CONDITION.   Henreitta Leber M.D on 02/15/2017 at 2:03 PM  Between 7am to 6pm - Pager - [phone]  After 6pm go to [email or web address] - Proofreader  [facility]  Office  [phone]  CC: Primary care physician; Juluis Pitch, MD

## 2017-02-16 ENCOUNTER — Encounter: Payer: Self-pay | Admitting: Gerontology

## 2017-02-16 ENCOUNTER — Inpatient Hospital Stay: Payer: Medicare Other

## 2017-02-16 DIAGNOSIS — J9601 Acute respiratory failure with hypoxia: Secondary | ICD-10-CM

## 2017-02-16 LAB — URINE CULTURE

## 2017-02-16 LAB — BASIC METABOLIC PANEL
Anion gap: 12 (ref 5–15)
BUN: 12 mg/dL (ref 6–20)
CALCIUM: 9 mg/dL (ref 8.9–10.3)
CO2: 33 mmol/L — ABNORMAL HIGH (ref 22–32)
Chloride: 81 mmol/L — ABNORMAL LOW (ref 101–111)
Creatinine, Ser: 0.42 mg/dL — ABNORMAL LOW (ref 0.44–1.00)
GFR calc Af Amer: >60 mL/min (ref >60)
GLUCOSE: 129 mg/dL — AB (ref 65–99)
Potassium: 4.2 mmol/L (ref 3.5–5.1)
Sodium: 126 mmol/L — ABNORMAL LOW (ref 135–145)

## 2017-02-16 LAB — GLUCOSE, CAPILLARY
GLUCOSE-CAPILLARY: 177 mg/dL — AB (ref 65–99)
GLUCOSE-CAPILLARY: 160 mg/dL — AB (ref 65–99)
GLUCOSE-CAPILLARY: 85 mg/dL (ref 65–99)
Glucose-Capillary: 124 mg/dL — ABNORMAL HIGH (ref 65–99)
Glucose-Capillary: 103 mg/dL — ABNORMAL HIGH (ref 65–99)
Glucose-Capillary: 137 mg/dL — ABNORMAL HIGH (ref 65–99)
Glucose-Capillary: 152 mg/dL — ABNORMAL HIGH (ref 65–99)
Glucose-Capillary: 138 mg/dL — ABNORMAL HIGH (ref 65–99)

## 2017-02-16 LAB — PROCALCITONIN

## 2017-02-16 LAB — MAGNESIUM: MAGNESIUM: 1.7 mg/dL (ref 1.7–2.4)

## 2017-02-16 MED ORDER — TRAZODONE HCL 50 MG PO TABS
100.0000 mg | ORAL_TABLET | Freq: Every day | ORAL | Status: DC
  Administered 2017-02-16 – 2017-02-17 (×3): 100 mg via ORAL
  Filled 2017-02-16: qty 2
  Filled 2017-02-16: qty 1
  Filled 2017-02-16: qty 2

## 2017-02-16 MED ORDER — MELATONIN 10 MG PO TABS: 1.0000 | ORAL_TABLET | Freq: Every day | ORAL | Status: DC

## 2017-02-16 MED ORDER — INSULIN ASPART 100 UNIT/ML ~~LOC~~ SOLN
0.0000 [IU] | Freq: Three times a day (TID) | SUBCUTANEOUS | Status: DC
  Administered 2017-02-16 – 2017-02-17 (×2): 2 [IU] via SUBCUTANEOUS
  Administered 2017-02-17: 08:00:00 1 [IU] via SUBCUTANEOUS
  Administered 2017-02-17: 2 [IU] via SUBCUTANEOUS
  Administered 2017-02-18: 12:00:00 3 [IU] via SUBCUTANEOUS
  Filled 2017-02-16 (×5): qty 1

## 2017-02-16 MED ORDER — INSULIN ASPART 100 UNIT/ML ~~LOC~~ SOLN: 0.0000 [IU] | Freq: Every day | SUBCUTANEOUS | Status: DC

## 2017-02-16 NOTE — Progress Notes (Signed)
Report given to Patrice Paradise, RN on 1C. CCMD notified of transfer. Family at bedside and updated. Patient updated. Patient to be transferred shortly via bed. Wilnette Kales

## 2017-02-16 NOTE — Progress Notes (Signed)
PHARMACY - PHYSICIAN COMMUNICATION CRITICAL VALUE ALERT - BLOOD CULTURE IDENTIFICATION (BCID)  Kathryn Hayes is an 83 y.o. female who presented to [facility] on 02/14/2017 with a chief complaint of SOB  Assessment:  Patient admitted w/ PNA, growing 1/4 GPC BCID: MRSA (include suspected source if known)  Name of physician (or Provider) Contacted: Marda Stalker  Current antibiotics: azithromycin  Changes to prescribed antibiotics recommended:  Patient is on recommended antibiotics - No changes needed possible contamination  Results for orders placed or performed during the hospital encounter of 02/14/17  Blood Culture ID Panel (Reflexed) (Collected: 02/14/2017  7:24 PM)  Result Value Ref Range   Enterococcus species NOT DETECTED NOT DETECTED   Vancomycin resistance NOT DETECTED NOT DETECTED   Listeria monocytogenes NOT DETECTED NOT DETECTED   Staphylococcus species DETECTED (A) NOT DETECTED   Staphylococcus aureus NOT DETECTED NOT DETECTED   Methicillin resistance DETECTED (A) NOT DETECTED   Streptococcus species NOT DETECTED NOT DETECTED   Streptococcus agalactiae NOT DETECTED NOT DETECTED   Streptococcus pneumoniae NOT DETECTED NOT DETECTED   Streptococcus pyogenes NOT DETECTED NOT DETECTED   Acinetobacter baumannii NOT DETECTED NOT DETECTED   Enterobacteriaceae species NOT DETECTED NOT DETECTED   Enterobacter cloacae complex NOT DETECTED NOT DETECTED   Escherichia coli NOT DETECTED NOT DETECTED   Klebsiella oxytoca NOT DETECTED NOT DETECTED   Klebsiella pneumoniae NOT DETECTED NOT DETECTED   Proteus species NOT DETECTED NOT DETECTED   Serratia marcescens NOT DETECTED NOT DETECTED   Carbapenem resistance NOT DETECTED NOT DETECTED   Haemophilus influenzae NOT DETECTED NOT DETECTED   Neisseria meningitidis NOT DETECTED NOT DETECTED   Pseudomonas aeruginosa NOT DETECTED NOT DETECTED   Candida albicans NOT DETECTED NOT DETECTED   Candida glabrata NOT DETECTED NOT DETECTED   Candida krusei NOT DETECTED NOT DETECTED   Candida parapsilosis NOT DETECTED NOT DETECTED   Candida tropicalis NOT DETECTED NOT DETECTED   Tobie Lords, PharmD, BCPS Clinical Pharmacist 02/16/2017

## 2017-02-16 NOTE — Progress Notes (Signed)
Location:   [facility] Room Number: [phone] Place of Service:  SNF [phone]) Provider:  Toni Arthurs, NP-C  Juluis Pitch, MD  Patient Care Team: Juluis Pitch, MD as PCP - General (Family Medicine) Alisa Graff, FNP as Nurse Practitioner (Family Medicine) Ubaldo Glassing Javier Docker, MD as Consulting Physician (Cardiology) Flora Lipps, MD as Consulting Physician (Pulmonary Disease)  Extended Emergency Contact Information Primary Emergency Contact: [name],[name] Address: [place], [place] [address] [name] of [place] Phone: [phone] Mobile Phone: [phone] Relation: Daughter Secondary Emergency Contact: Statia, Burdick Mobile Phone: [phone] Relation: Son  Code Status:  FULL Goals of care: Advanced Directive information Advanced Directives 02/15/2017  Does Patient Have a Medical Advance Directive? No  Type of Advance Directive -  Does patient want to make changes to medical advance directive? No - Patient declined  Copy of [place] in Chart? -  Would patient like information on creating a medical advance directive? No - Patient declined     Chief Complaint  Patient presents with  . Acute Visit    Recheck respiratory distress    HPI:  Pt is a 83 y.o. female seen today for an acute visit for acute respiratory distress.  Was notified by nursing that the patient was having increased difficulty breathing.  Patient was tachypneic.  Breath sounds were congested with rhonchi and crackles in bilateral upper lobes.  Bilateral lower lobes were diminished.  O2 saturation between 90-93% on 3.5 L.  All vitals were at baseline when assessed last evening and this morning.  Now, respirations are approximately 26/min.  Patient has a coarse sounding cough but is unable to expectorate secretions.  Patient verbalizes that she does not want to go back to the emergency room unless necessary.  Patient is a full code.   Afebrile at this point.  Called the daughter and discussed the current findings and situation.  Daughter verbalizes, as well, that she would like to try interventions in facility, such as antibiotics and breathing treatments, etc.  If patient's condition worsens or if she becomes unstable/not responsive to treatment, then she would potentially want her to go to the emergency room.  Daughter states she is leaving work will be coming to the facility to check on her mother shortly.  We will monitor closely.  Please note pt with limited verbal ability. Unable to obtain complete ROS. Some ROS info obtained from staff and documentation.  Note: Daughter came to the facility around noon and stayed at her mother's bedside.  At approximately 7 PM, daughter decided she wanted patient to be sent to the emergency room.  Verbal order given to staff to send to the ED to eval and treat.   Past Medical History:  Diagnosis Date  . Anxiety    unspecified  . Arthritis   . Breast cancer ([place]) 2007   Early stage left breast cancer status post partial colectomy, 04/2005 (Dr. Oliva Bustard)  . Chest pain, unspecified   . CHF (congestive heart failure) ([place])   . Chronic headache   . Colon cancer ([place])    status post partial colectomy, diagnosed 2000  . Colon neoplasm   . COPD (chronic obstructive pulmonary disease) ([place])   . Depression    unspecified  . Diabetes mellitus   . Fibrocystic breast   . History of breast cancer   . History of colon cancer   . HTN (hypertension) with goal to  be determined   . Hyperlipidemia   . Hyperlipidemia, unspecified   . Hypertension   . Obesity, unspecified   . Sleep apnea    mild to moderate   Past Surgical History:  Procedure Laterality Date  . APPENDECTOMY    . BREAST LUMPECTOMY  2007   left  . BUNIONECTOMY Right   . CATARACT EXTRACTION W/ INTRAOCULAR LENS IMPLANT & ANTERIOR VITRECTOMY, BILATERAL Bilateral   . COLON SURGERY  1993  . COLOSTOMY    . DILATION AND  CURETTAGE, DIAGNOSTIC / THERAPEUTIC     Fibroid removal  . INSERT REPLACE REMOVE PACEMAKER     ppm medtronic [id number] Adapta dula chamber rate responsive. 02/21/14  . MASTECTOMY PARTIAL / LUMPECTOMY    . PARTIAL COLECTOMY     Abdominal & Transanal   . TONSILLECTOMY    . TOTAL KNEE ARTHROPLASTY Right 07/03/2013   Right total knee arthroplasty using computer assisted navigation  . TUBAL LIGATION Bilateral 1971   with questionable appendectomy    No Known Allergies  Allergies as of 02/14/2017   No Known Allergies     Medication List    Notice   This visit is during an admission. Changes to the med list made in this visit will be reflected in the After Visit Summary of the admission.     Review of Systems  Constitutional: Positive for activity change, appetite change, diaphoresis and fatigue. Negative for chills and fever.  HENT: Positive for congestion. Negative for mouth sores, nosebleeds, postnasal drip, sneezing, sore throat, trouble swallowing and voice change.   Respiratory: Positive for cough, shortness of breath and wheezing. Negative for apnea, choking and chest tightness.   Cardiovascular: Negative for chest pain, palpitations and leg swelling.  Gastrointestinal: Negative for abdominal distention, abdominal pain, constipation, diarrhea and nausea.  Genitourinary: Negative for difficulty urinating, dysuria, frequency and urgency.  Musculoskeletal: Positive for arthralgias (typical arthritis). Negative for back pain, gait problem and myalgias.  Skin: Negative for color change, pallor, rash and wound.  Neurological: Positive for weakness. Negative for dizziness, tremors, syncope, speech difficulty, numbness and headaches.  Psychiatric/Behavioral: Negative for agitation and behavioral problems.  All other systems reviewed and are negative.   Immunization History  Administered Date(s) Administered  . Influenza Split 11/08/2013, 11/21/2014  . Influenza, High Dose Seasonal PF  12/05/2012  . Influenza-Unspecified 08/05/2015, 09/22/2016  . PPD Test 03/05/2014, 10/09/2015, 02/10/2016  . Pneumococcal Polysaccharide-23 05/16/2010  . Pneumococcal-Unspecified 03/05/2014   Pertinent  Health Maintenance Due  Topic Date Due  . FOOT EXAM  03/[birth year]/1946  . OPHTHALMOLOGY EXAM  03/23/1946  . URINE MICROALBUMIN  03/23/1946  . DEXA SCAN  03/22/2001  . PNA vac Low Risk Adult (2 of 2 - PCV13) 03/05/2015  . HEMOGLOBIN A1C  12/08/2016  . INFLUENZA VACCINE  Completed   Fall Risk  04/05/2016 12/15/2015 10/07/2015 09/09/2015  Falls in the past year? No No Yes Yes  Number falls in past yr: - - 2 or more 2 or more  Injury with Fall? - - No No  Risk Factor Category  - - - High Fall Risk  Risk for fall due to : - - - History of fall(s)  Follow up - - Education provided;Falls evaluation completed Education provided   Functional Status Survey:    Vitals:   02/14/17 1354  BP: 114/67  Pulse: 60  Resp: 20  Temp: 98.8 F (37.1 C)  TempSrc: Oral  SpO2: 93%  Weight: 213 lb 8 oz (96.8 kg)  Height: 5'  4" (1.626 m)   Body mass index is 36.65 kg/m. Physical Exam  Constitutional: She is oriented to person, place, and time. Vital signs are normal. She appears well-developed and well-nourished. She appears lethargic. She is active and cooperative. She appears ill. She appears distressed. Nasal cannula in place.  HENT:  Head: Normocephalic and atraumatic.  Mouth/Throat: Uvula is midline, oropharynx is clear and moist and mucous membranes are normal. Mucous membranes are not pale, not dry and not cyanotic.  Eyes: Pupils are equal, round, and reactive to light. Conjunctivae, EOM and lids are normal.  Neck: Trachea normal, normal range of motion and full passive range of motion without pain. Neck supple. No JVD present. No tracheal deviation, no edema and no erythema present. No thyromegaly present.  Cardiovascular: Normal rate, regular rhythm, normal heart sounds, intact distal pulses and  normal pulses. Exam reveals no gallop, no distant heart sounds and no friction rub.  No murmur heard. Pulses:      Dorsalis pedis pulses are 2+ on the right side, and 2+ on the left side.  No edema  Pulmonary/Chest: Accessory muscle usage present. She is in respiratory distress. She has decreased breath sounds in the right lower field and the left lower field. She has wheezes in the right upper field, the right middle field, the left upper field and the left middle field. She has rhonchi in the right upper field, the right middle field, the left upper field and the left middle field. She has rales in the right upper field, the right middle field, the left upper field and the left middle field. She exhibits no tenderness.  Abdominal: Soft. Normal appearance and bowel sounds are normal. She exhibits no distension and no ascites. There is no tenderness.  Musculoskeletal: Normal range of motion. She exhibits no edema or tenderness.  Expected osteoarthritis, stiffness; Bilateral Calves soft, supple. Negative Homan's Sign. B- pedal pulses equal; generalized weakness  Neurological: She is oriented to person, place, and time. She has normal strength. She appears lethargic. A cranial nerve deficit and sensory deficit is present. Coordination and gait abnormal.  Skin: Skin is warm, dry and intact. She is not diaphoretic. No cyanosis. No pallor. Nails show no clubbing.  Psychiatric: She has a normal mood and affect. Her speech is normal and behavior is normal. Judgment and thought content normal. Cognition and memory are impaired. She exhibits abnormal recent memory.  Nursing note and vitals reviewed.   Labs reviewed: Recent Labs    06/22/16 0545  02/14/17 2008 02/14/17 2240 02/15/17 0631 02/16/17 1151  NA 126*   < > 120*  --  122* 126*  K 4.5   < > 4.7  --  5.3* 4.2  CL 87*   < > 75*  --  84* 81*  CO2 31   < > 33*  --  30 33*  GLUCOSE 86   < > 214*  --  208* 129*  BUN 28*   < > 18  --  14 12    CREATININE 0.57   < > 0.59  --  0.52 0.42*  CALCIUM 8.9   < > 8.8*  --  8.3* 9.0  MG 1.8  --   --  1.6*  --  1.7   < > = values in this interval not displayed.   Recent Labs    11/19/16 1631 02/14/17 1220 02/14/17 2008  AST 19 29 28   ALT 13* 19 23  ALKPHOS 50 48 51  BILITOT 0.6  0.7 0.7  PROT 5.6* 6.1* 6.1*  ALBUMIN 2.6* 3.0* 2.8*   Recent Labs    11/19/16 1631 02/14/17 1220 02/14/17 1923 02/15/17 0631  WBC 7.1 10.8 11.9* 8.5  NEUTROABS 4.4 7.3* 10.8*  --   HGB 10.6* 11.8* 12.1 10.7*  HCT 32.9* 35.5 36.4 31.6*  MCV 89.1 88.0 86.2 86.4  PLT 252 294 313 250   Lab Results  Component Value Date   TSH 2.998 02/12/2016   Lab Results  Component Value Date   HGBA1C 5.6 06/08/2016   Lab Results  Component Value Date   CHOL 106 02/12/2016   HDL 42 02/12/2016   LDLCALC 49 02/12/2016   TRIG 74 02/12/2016   CHOLHDL 2.5 02/12/2016    Significant Diagnostic Results in last 30 days:  Dg Abd 1 View  Result Date: 02/16/2017 CLINICAL DATA:  Abdominal distention. EXAM: ABDOMEN - 1 VIEW COMPARISON:  None. FINDINGS: There is a moderately large amount of stool in the proximal colon. A small amount of stool is noted more distally in the colon, and there is mild gaseous distension of the transverse and descending colon. No dilated small bowel loops are seen to suggest obstruction. No intraperitoneal free air is identified on this supine study. The visualized lung bases are grossly clear. Pacemaker leads and cardiomegaly are noted. No acute osseous abnormality is identified. IMPRESSION: Moderately large amount of proximal colonic stool. Mild left-sided colonic gaseous distention without evidence of obstruction. Electronically Signed   By: Logan Bores M.D.   On: 02/16/2017 07:28   Dg Chest Port 1 View  Result Date: 02/15/2017 CLINICAL DATA:  Acute respiratory failure. EXAM: PORTABLE CHEST 1 VIEW COMPARISON:  02/14/2017 FINDINGS: The patient is rotated to the right. A dual lead pacemaker  remains in place. The cardiac silhouette remains enlarged. Thoracic aortic atherosclerosis and tortuosity are again noted. There is mild left basilar atelectasis. No lobar consolidation, overt pulmonary edema, sizable pleural effusion, or pneumothorax is identified. IMPRESSION: Cardiomegaly and mild left basilar atelectasis. Electronically Signed   By: Logan Bores M.D.   On: 02/15/2017 06:44   Dg Chest Portable 1 View  Result Date: 02/14/2017 CLINICAL DATA:  83 year old female with a history of respiratory distress EXAM: PORTABLE CHEST 1 VIEW COMPARISON:  02/14/2017, 09/22/2015 FINDINGS: Cardiomediastinal silhouette unchanged with cardiomegaly. Calcifications of the aortic arch. No evidence of interlobular septal thickening. No confluent airspace disease. No pneumothorax. No pleural effusion. Left chest wall cardiac pacing device. Coarsened interstitial markings. IMPRESSION: Chronic lung changes without evidence of superimposed acute cardiopulmonary disease. Cardiomegaly. Unchanged cardiac pacing device. Electronically Signed   By: Corrie Mckusick D.O.   On: 02/14/2017 21:05   Dg Chest Portable 1 View  Result Date: 02/14/2017 CLINICAL DATA:  Respiratory distress EXAM: PORTABLE CHEST 1 VIEW COMPARISON:  [id number] chest radiograph. FINDINGS: Right rotated chest radiographs. Stable configuration of 2 lead left subclavian pacemaker. Stable cardiomediastinal silhouette with mild cardiomegaly. No pneumothorax. No pleural effusion. No overt pulmonary edema. Left retrocardiac atelectasis. IMPRESSION: Stable mild cardiomegaly without overt pulmonary edema. Left retrocardiac atelectasis. Electronically Signed   By: Ilona Sorrel M.D.   On: 02/14/2017 19:39    Assessment/Plan Acute respiratory distress  Acetylcysteine 10%-10 mL nebulizer 3 times daily, to be given after the DuoNeb  Continue the budesonide 0.5 mg nebulizer twice daily  Rocephin 2 g IV x1 now  Lasix 40 mg IV x1 now  Glycopyrrolate 0.2  mg/milliliter give 0.5 mL subcu x1 now for secretions  Continue twice daily scheduled duo nebs and  every 6 hours as needed  Place scopolamine patch 1.5 mg behind the ear every 3 days for secretions/pulmonary edema  Solu-Medrol 125 mg IV x1 now  Labs stat  2 view chest x-ray stat   Sent to ED for evaluation and treatment per family request  Family/ staff Communication:   Total Time: 55 minutes  Documentation: 10 minutes  Face to Face: 30 minutes  Family/Phone: 15 minutes   Labs/tests ordered: 2 view chest x-ray, CBC, met C, BNP  Medication list reviewed and assessed for continued appropriateness.  Vikki Ports, NP-C Geriatrics [facility] [phone], [place] [address] Cell Phone (Mon-Fri 8am-5pm):  [phone] On Call:  [phone] & follow prompts after 5pm & weekends Office Phone:  [phone] Office Fax:  [phone]

## 2017-02-16 NOTE — Consult Note (Signed)
   Name: Kathryn Hayes MRN: [id number] DOB: Mar 27, 1934    ADMISSION DATE:  02/14/2017 CONSULTATION DATE: 02/14/2017  REFERRING MD : Dr. Bridgett Larsson   CHIEF COMPLAINT: Respiratory Distress   BRIEF PATIENT DESCRIPTION:  83 yo female admitted with septic shock of unknown etiology, acute on chronic respiratory failure requiring Bipap, and acute encephalopathy  Treat as pneumonia  SIGNIFICANT EVENTS  02/11-Pt admitted to stepdown unit   STUDIES:  None   HISTORY OF PRESENT ILLNESS:   resp status much improved Coughing resolving On minimal oxygen  REVIEW OF SYSTEMS:   No acute complaints Other ROS negative    VITAL SIGNS: Temp:  [97.5 F (36.4 C)-98.5 F (36.9 C)] 98.5 F (36.9 C) (02/13 0800) Pulse Rate:  [60-117] 100 (02/13 0800) Resp:  [12-29] 25 (02/13 0800) BP: (84-175)/(50-153) 126/59 (02/13 0600) SpO2:  [91 %-98 %] 92 % (02/13 0800) FiO2 (%):  [40 %] 40 % (02/13 0732)  PHYSICAL EXAMINATION: General: obese WF, NAD on [place]  Neuro:  follows commands, PERRL HEENT: supple, no JVD Cardiovascular: vpaced, no M/R/G Lungs: rhonchi with faint expiratory wheezes throughout, even, non labored on Bipap  Abdomen: obese, +BS x4, soft, non tender, non distended  Musculoskeletal: normal bulk and tone, no edema  Skin: scattered ecchymosis, no rashes or lesions   Recent Labs  Lab 02/14/17 1220 02/14/17 2008 02/15/17 0631  NA 121* 120* 122*  K 5.0 4.7 5.3*  CL 77* 75* 84*  CO2 33* 33* 30  BUN 14 18 14   CREATININE 0.47 0.59 0.52  GLUCOSE 68 214* 208*   Recent Labs  Lab 02/14/17 1220 02/14/17 1923 02/15/17 0631  HGB 11.8* 12.1 10.7*  HCT 35.5 36.4 31.6*  WBC 10.8 11.9* 8.5  PLT 294 313 250     ASSESSMENT / PLAN: 83 yo white female admitted for acute resp failure from COPD exacerbation from acute bronchitis with CHF exacerbation Weaned off biPAP  Sepsis resolving  Acute on chronic respiratory failure possible secondary to URI   Hypotension secondary to sepsis  -resolving   Scheduled and prn bronchodilator therapy  Maintain map >65 Continuous telemetry monitoring  Trend BMP  Replace electrolytes as indicated Monitor UOP  Lovenox for VTE prophylaxis  Trend CBC Monitor for s/sx of bleeding and transfuse for hgb <7 Avoid sedating medications  Follow cultures  Continue current abx for now  CBG's q4hrs and SSI  IV steroids  Ok to transfer to gen med floor    Kendell Sagraves Patricia Pesa, M.D.  [facility]  Medical Director [place] Director [place] Department

## 2017-02-16 NOTE — Progress Notes (Signed)
Patient taken off of bipap and placed on 2l [place] for the day. Tol well at this time.

## 2017-02-16 NOTE — Progress Notes (Signed)
[place] [facility] [place] NAME: Kathryn Hayes    MR#:  [id number]  DATE OF BIRTH:  March 21, 1934  SUBJECTIVE:   Patient here due to shortness of breath and noted to be in acute on chronic respiratory failure with hypoxia and hypercapnia secondary to COPD exacerbation. Patient was also initially hypotensive but did not require pressors.  Patient is off the BiPAP and on O2 [place] 2L. She feels much better.  REVIEW OF SYSTEMS:    Review of Systems  Constitutional: Positive for malaise/fatigue. Negative for chills and fever.  HENT: Negative for congestion and tinnitus.   Eyes: Negative for blurred vision and double vision.  Respiratory: Positive for cough and shortness of breath. Negative for wheezing.   Cardiovascular: Negative for chest pain, orthopnea and PND.  Gastrointestinal: Negative for abdominal pain, diarrhea, nausea and vomiting.  Genitourinary: Negative for dysuria and hematuria.  Neurological: Positive for weakness. Negative for dizziness, sensory change and focal weakness.  All other systems reviewed and are negative.   Nutrition: Soft diet Tolerating Diet: Yes Tolerating PT: Await Eval.    DRUG ALLERGIES:  No Known Allergies  VITALS:  Blood pressure (!) 116/48, pulse 100, temperature 98.5 F (36.9 C), temperature source Oral, resp. rate (!) 25, height 5\' 4"  (1.626 m), weight 215 lb (97.5 kg), SpO2 90 %.  PHYSICAL EXAMINATION:   Physical Exam  GENERAL:  83 y.o.-year-old obese patient lying in bed in no acute distress.  EYES: Pupils equal, round, reactive to light and accommodation. No scleral icterus. Extraocular muscles intact.  HEENT: Head atraumatic, normocephalic. Oropharynx and nasopharynx clear.  NECK:  Supple, no jugular venous distention. No thyroid enlargement, no tenderness.  LUNGS: Prolonged Insp. & Exp. phase, mild wheezing, no rales, rhonchi. No use of accessory muscles of respiration.  CARDIOVASCULAR: S1, S2 normal.  No murmurs, rubs, or gallops.  ABDOMEN: Soft, nontender, nondistended. Bowel sounds present. No organomegaly or mass.  EXTREMITIES: No cyanosis, clubbing or edema b/l.    NEUROLOGIC: Cranial nerves II through XII are intact. No focal Motor or sensory deficits b/l.  Globally weak.  PSYCHIATRIC: The patient is alert and oriented x 3.  SKIN: No obvious rash, lesion, or ulcer.    LABORATORY PANEL:   CBC Recent Labs  Lab 02/15/17 0631  WBC 8.5  HGB 10.7*  HCT 31.6*  PLT 250   ------------------------------------------------------------------------------------------------------------------  Chemistries  Recent Labs  Lab 02/14/17 2008  02/16/17 1151  NA 120*   < > 126*  K 4.7   < > 4.2  CL 75*   < > 81*  CO2 33*   < > 33*  GLUCOSE 214*   < > 129*  BUN 18   < > 12  CREATININE 0.59   < > 0.42*  CALCIUM 8.8*   < > 9.0  MG  --    < > 1.7  AST 28  --   --   ALT 23  --   --   ALKPHOS 51  --   --   BILITOT 0.7  --   --    < > = values in this interval not displayed.   ------------------------------------------------------------------------------------------------------------------  Cardiac Enzymes Recent Labs  Lab 02/14/17 2008  TROPONINI <0.03   ------------------------------------------------------------------------------------------------------------------  RADIOLOGY:  Dg Abd 1 View  Result Date: 02/16/2017 CLINICAL DATA:  Abdominal distention. EXAM: ABDOMEN - 1 VIEW COMPARISON:  None. FINDINGS: There is a moderately large amount of stool in the proximal colon. A small amount  of stool is noted more distally in the colon, and there is mild gaseous distension of the transverse and descending colon. No dilated small bowel loops are seen to suggest obstruction. No intraperitoneal free air is identified on this supine study. The visualized lung bases are grossly clear. Pacemaker leads and cardiomegaly are noted. No acute osseous abnormality is identified. IMPRESSION: Moderately  large amount of proximal colonic stool. Mild left-sided colonic gaseous distention without evidence of obstruction. Electronically Signed   By: Logan Bores M.D.   On: 02/16/2017 07:28   Dg Chest Port 1 View  Result Date: 02/15/2017 CLINICAL DATA:  Acute respiratory failure. EXAM: PORTABLE CHEST 1 VIEW COMPARISON:  02/14/2017 FINDINGS: The patient is rotated to the right. A dual lead pacemaker remains in place. The cardiac silhouette remains enlarged. Thoracic aortic atherosclerosis and tortuosity are again noted. There is mild left basilar atelectasis. No lobar consolidation, overt pulmonary edema, sizable pleural effusion, or pneumothorax is identified. IMPRESSION: Cardiomegaly and mild left basilar atelectasis. Electronically Signed   By: Logan Bores M.D.   On: 02/15/2017 06:44   Dg Chest Portable 1 View  Result Date: 02/14/2017 CLINICAL DATA:  83 year old female with a history of respiratory distress EXAM: PORTABLE CHEST 1 VIEW COMPARISON:  02/14/2017, 09/22/2015 FINDINGS: Cardiomediastinal silhouette unchanged with cardiomegaly. Calcifications of the aortic arch. No evidence of interlobular septal thickening. No confluent airspace disease. No pneumothorax. No pleural effusion. Left chest wall cardiac pacing device. Coarsened interstitial markings. IMPRESSION: Chronic lung changes without evidence of superimposed acute cardiopulmonary disease. Cardiomegaly. Unchanged cardiac pacing device. Electronically Signed   By: Corrie Mckusick D.O.   On: 02/14/2017 21:05   Dg Chest Portable 1 View  Result Date: 02/14/2017 CLINICAL DATA:  Respiratory distress EXAM: PORTABLE CHEST 1 VIEW COMPARISON:  [id number] chest radiograph. FINDINGS: Right rotated chest radiographs. Stable configuration of 2 lead left subclavian pacemaker. Stable cardiomediastinal silhouette with mild cardiomegaly. No pneumothorax. No pleural effusion. No overt pulmonary edema. Left retrocardiac atelectasis. IMPRESSION: Stable mild cardiomegaly  without overt pulmonary edema. Left retrocardiac atelectasis. Electronically Signed   By: Ilona Sorrel M.D.   On: 02/14/2017 19:39     ASSESSMENT AND PLAN:   83 year old female with past medical history obstructive sleep apnea, obesity, hypertension, hyperlipidemia, history of colon cancer breast cancer diabetes COPD depression who presents to the hospital due to shortness of breath and noted to be in acute on chronic respiratory failure with hypoxia and hypercapnia.  1. Acute on chronic respiratory failure with hypoxia and hypercapnia-secondary to COPD exacerbation. -Initially patient was on BiPAP but now has been weaned off of it. -Continue O2 supplementation, CPAP at bedtime. Continuous steroids, DuoNeb nebs, Pulmicort nebs and Z-Pak.  2. COPD exacerbation-this was the cause of patient's worsening respiratory failure. -Much improved since yesterday as patient has been weaned off BiPAP. Continue IV steroids, scheduled DuoNeb's, Pulmicort and a Z-Pak. Chest x-ray was negative for acute pathology.  3. Hyponatremia-etiology unclear. Questionable SIADH related to pulmonary process/volume depletion. She was given gentle IV fluids, now on Lasix. Sodium is improving. Patient is clinically asymptomatic.  Follow BMP.  4. GERD-continue Protonix.  5. Hypomagnesemia-patient was given IV mag sulfate.  Improving.  6. Depression- continue Cymbalta.  7. Hyperglycemia - due to steroids.  Cont. SSI.    Hypotension secondary to sepsis -resolving  Generalized weakness.  PT evaluation. Discussed with Dr. Mortimer Fries. All the records are reviewed and case discussed with Care Management/Social Worker. Management plans discussed with the patient, family and they are in agreement.  CODE STATUS: Full code  DVT Prophylaxis: Lovenox  TOTAL TIME TAKING CARE OF THIS PATIENT: 30 minutes.   POSSIBLE D/C IN 1-2 DAYS, DEPENDING ON CLINICAL CONDITION.   Demetrios Loll M.D on 02/16/2017 at 3:39 PM  Between 7am to 6pm  - Pager - [phone]  After 6pm go to [email or web address] - Proofreader  [facility]  Office  [phone]  CC: Primary care physician; Juluis Pitch, MD

## 2017-02-17 LAB — GLUCOSE, CAPILLARY
GLUCOSE-CAPILLARY: 135 mg/dL — AB (ref 65–99)
GLUCOSE-CAPILLARY: 165 mg/dL — AB (ref 65–99)
Glucose-Capillary: 168 mg/dL — ABNORMAL HIGH (ref 65–99)
Glucose-Capillary: 158 mg/dL — ABNORMAL HIGH (ref 65–99)

## 2017-02-17 LAB — BASIC METABOLIC PANEL
ANION GAP: 12 (ref 5–15)
BUN: 10 mg/dL (ref 6–20)
CO2: 32 mmol/L (ref 22–32)
Calcium: 8.8 mg/dL — ABNORMAL LOW (ref 8.9–10.3)
Chloride: 81 mmol/L — ABNORMAL LOW (ref 101–111)
Creatinine, Ser: 0.52 mg/dL (ref 0.44–1.00)
Glucose, Bld: 146 mg/dL — ABNORMAL HIGH (ref 65–99)
Potassium: 4.2 mmol/L (ref 3.5–5.1)
SODIUM: 125 mmol/L — AB (ref 135–145)

## 2017-02-17 MED ORDER — LORAZEPAM 0.5 MG PO TABS
0.5000 mg | ORAL_TABLET | Freq: Three times a day (TID) | ORAL | Status: DC | PRN
  Administered 2017-02-17 – 2017-02-18 (×3): 0.5 mg via ORAL
  Filled 2017-02-17 (×3): qty 1

## 2017-02-17 MED ORDER — PREDNISONE 20 MG PO TABS
40.0000 mg | ORAL_TABLET | Freq: Every day | ORAL | Status: DC
  Administered 2017-02-18: 09:00:00 40 mg via ORAL
  Filled 2017-02-17: qty 2

## 2017-02-17 NOTE — Progress Notes (Signed)
* [facility]     Assessment and Plan:  83 yo white female admitted for acute resp failure from COPD exacerbation from acute bronchitis with CHF exacerbation Acute on chronic hypercapnic respiratory failure which may be secondary to COPD. -Currently on prednisone 40 mg daily, wean down as tolerated. -Wean oxygen as tolerated, advance activity as tolerated. - Incentive spirometry while awake. - Outpatient sleep study to rule out sleep apnea as a cause of the patient's hypoventilation.   Date: 02/17/2017  MRN# [id number] Kathryn Hayes May 12, 1934   Kathryn Hayes is a 83 y.o. old female seen in follow up for chief complaint of  Chief Complaint  Patient presents with  . Respiratory Distress     HPI:  Patient feels that she is doing better today, no new complaints.  Patient is currently on 2 L nasal cannula.  Review of metabolic panel shows persistently elevated CO2 consistent with hypercapnia in addition patient's ABG shows a pH of 7.41/60 1/60 6/38.7, consistent with chronic hypercapnic respiratory failure.  Medication:    Current Facility-Administered Medications:  .  acetaminophen (TYLENOL) tablet 650 mg, 650 mg, Oral, Q6H PRN **OR** acetaminophen (TYLENOL) suppository 650 mg, 650 mg, Rectal, Q6H PRN, Demetrios Loll, MD .  albuterol (PROVENTIL) (2.5 MG/3ML) 0.083% nebulizer solution 2.5 mg, 2.5 mg, Nebulization, Q2H PRN, Demetrios Loll, MD, 2.5 mg at 02/15/17 1406 .  bisacodyl (DULCOLAX) EC tablet 5 mg, 5 mg, Oral, Daily PRN, Demetrios Loll, MD .  budesonide (PULMICORT) nebulizer solution 0.5 mg, 0.5 mg, Nebulization, BID, Mortimer Fries, Kurian, MD, 0.5 mg at 02/17/17 0729 .  DULoxetine (CYMBALTA) DR capsule 60 mg, 60 mg, Oral, Daily, Kasa, Kurian, MD, 60 mg at 02/17/17 0821 .  enoxaparin (LOVENOX) injection 40 mg, 40 mg, Subcutaneous, Q24H, Demetrios Loll, MD, 40 mg at 02/16/17 2255 .  furosemide (LASIX) tablet 20 mg, 20 mg, Oral, Daily, Flora Lipps, MD, 20 mg at 02/17/17 1884 .   guaiFENesin-dextromethorphan (ROBITUSSIN DM) 100-10 MG/5ML syrup 5 mL, 5 mL, Oral, Q4H PRN, Flora Lipps, MD, 5 mL at 02/17/17 0026 .  HYDROcodone-acetaminophen (NORCO/VICODIN) 5-325 MG per tablet 1-2 tablet, 1-2 tablet, Oral, Q4H PRN, Demetrios Loll, MD, 1 tablet at 02/17/17 0544 .  insulin aspart (novoLOG) injection 0-5 Units, 0-5 Units, Subcutaneous, QHS, Kasa, Kurian, MD .  insulin aspart (novoLOG) injection 0-9 Units, 0-9 Units, Subcutaneous, TID WC, Flora Lipps, MD, 2 Units at 02/17/17 1200 .  ipratropium-albuterol (DUONEB) 0.5-2.5 (3) MG/3ML nebulizer solution 3 mL, 3 mL, Nebulization, Q4H, Kasa, Kurian, MD, 3 mL at 02/17/17 1524 .  LORazepam (ATIVAN) tablet 0.5 mg, 0.5 mg, Oral, QHS PRN, Awilda Bill, NP, 0.5 mg at 02/16/17 2254 .  Melatonin TABS 5 mg, 5 mg, Oral, QHS PRN, Mortimer Fries, Kurian, MD .  menthol-cetylpyridinium (CEPACOL) lozenge 3 mg, 1 lozenge, Oral, PRN, Flora Lipps, MD, 3 mg at 02/16/17 1050 .  ondansetron (ZOFRAN) tablet 4 mg, 4 mg, Oral, Q6H PRN **OR** ondansetron (ZOFRAN) injection 4 mg, 4 mg, Intravenous, Q6H PRN, Demetrios Loll, MD .  pantoprazole (PROTONIX) EC tablet 40 mg, 40 mg, Oral, Daily, Kasa, Kurian, MD, 40 mg at 02/17/17 0821 .  [START ON 02/18/2017] predniSONE (DELTASONE) tablet 40 mg, 40 mg, Oral, Q breakfast, Demetrios Loll, MD .  senna-docusate (Senokot-S) tablet 1 tablet, 1 tablet, Oral, QHS PRN, Demetrios Loll, MD .  traZODone (DESYREL) tablet 100 mg, 100 mg, Oral, QHS, Blakeney, Dreama Saa, NP, 100 mg at 02/16/17 2254   Allergies:  Patient has no known allergies.  Review of Systems: Gen:  Denies  fever, sweats. HEENT: Denies blurred vision. Cvc:  No dizziness, chest pain or heaviness Resp:   Denies cough or sputum porduction. Gi: Denies swallowing difficulty, stomach pain. constipation, bowel incontinence Gu:  Denies bladder incontinence, burning urine Ext:   No Joint pain, stiffness. Skin: No skin rash, easy bruising. Endoc:  No polyuria, polydipsia. Psych: No  depression, insomnia. Other:  All other systems were reviewed and found to be negative other than what is mentioned in the HPI.   Physical Examination:   VS: BP (!) 142/72 (BP Location: Left Arm)   Pulse 62   Temp 98.3 F (36.8 C) (Oral)   Resp 18   Ht 5\' 4"  (1.626 m)   Wt 215 lb (97.5 kg)   SpO2 (!) 83%   BMI 36.90 kg/m    General Appearance: No distress  Neuro:without focal findings,  speech normal,  HEENT: PERRLA, EOM intact. Pulmonary: normal breath sounds, No wheezing.   CardiovascularNormal S1,S2.  No m/r/g.   Abdomen: Benign, Soft, non-tender. Renal:  No costovertebral tenderness  GU:  Not performed at this time. Endoc: No evident thyromegaly, no signs of acromegaly. Skin:   warm, no rash. Extremities: normal, no cyanosis, clubbing.   LABORATORY PANEL:   CBC Recent Labs  Lab 02/15/17 0631  WBC 8.5  HGB 10.7*  HCT 31.6*  PLT 250   ------------------------------------------------------------------------------------------------------------------  Chemistries  Recent Labs  Lab 02/14/17 2008  02/16/17 1151 02/17/17 0553  NA 120*   < > 126* 125*  K 4.7   < > 4.2 4.2  CL 75*   < > 81* 81*  CO2 33*   < > 33* 32  GLUCOSE 214*   < > 129* 146*  BUN 18   < > 12 10  CREATININE 0.59   < > 0.42* 0.52  CALCIUM 8.8*   < > 9.0 8.8*  MG  --    < > 1.7  --   AST 28  --   --   --   ALT 23  --   --   --   ALKPHOS 51  --   --   --   BILITOT 0.7  --   --   --    < > = values in this interval not displayed.   ------------------------------------------------------------------------------------------------------------------  Cardiac Enzymes Recent Labs  Lab 02/14/17 2008  TROPONINI <0.03   ------------------------------------------------------------  RADIOLOGY:   No results found for this or any previous visit. Results for orders placed during the hospital encounter of 05/21/14  DG Chest 2 View   Narrative CLINICAL DATA:  COPD and recent admission for  pneumonia.  EXAM: CHEST - 2 VIEW  COMPARISON:  04/30/2014  FINDINGS: The small bilateral pleural effusions seen previously have essentially resolved. There is some residual scarring and atelectasis at the left lung base. Underlying chronic lung disease/COPD is stable. There is stable mild cardiac enlargement and appearance of a dual-chamber pacemaker. No edema or pneumothorax identified. Stable osteopenia and spondylosis of the thoracic spine.  IMPRESSION: Resolution of small bilateral pleural effusions. Scarring and atelectasis at the left lung base.   Electronically Signed   By: Aletta Edouard M.D.   On: 05/21/2014 17:38    ------------------------------------------------------------------------------------------------------------------  Thank  you for allowing [facility] to assist in the care of your patient. Our recommendations are noted above.  Please contact us if we can be of further service.   Marda Stalker, MD.  [facility]  Pulmonary and Critical Care Office Number: [place]  Patricia Pesa, M.D.  Merton Border, M.D  02/17/2017

## 2017-02-17 NOTE — Progress Notes (Signed)
Telephone call to [facility] [phone] spoke with RN on [facility]; pt's documented last bowel movement was 02/12/17

## 2017-02-17 NOTE — Progress Notes (Signed)
PT Cancellation Note  Patient Details Name: CASHLYN HUGULEY MRN: [id number] DOB: [birth year]   Cancelled Treatment:    Reason Eval/Treat Not Completed: PT screened, no needs identified, will sign off(Consult received and chart reviewed.  Per discussion with patient, patient is [place]-term care resident and has been at facility for 12-18 months.  Patient is WC level at baseline and uses hoyer lift for all transfers; dep care for ADLs and hygiene at facility.  Has not been ambulatory for approx 2 years per patient report.  Did participate with course of therapy, but was not able to make significant functional gains.  Above status confirmed with caregiver at facility.  Patient appears at baseline level of functional ability without acute change in status required to necessitate skilled PT services at this time.  Will complete order at this time.  CSW informed/aware and confirms above information as well).   Matasha Smigelski H. Owens Shark, PT, DPT, NCS 02/17/17, 1:29 PM [phone]

## 2017-02-17 NOTE — Clinical Social Work Note (Addendum)
Clinical Social Work Assessment  Patient Details  Name: Kathryn Hayes MRN: [id number] Date of Birth: Aug 02, 1934  Date of referral:  02/17/17               Reason for consult:  Facility Placement                Permission sought to share information with:  Facility Art therapist granted to share information::  Yes, Verbal Permission Granted  Name::        Agency::     Relationship::     Contact Information:     Housing/Transportation Living arrangements for the past 2 months:  [place] of Information:  Patient, Power of [place], Adult Children Patient Interpreter Needed:  None Criminal Activity/Legal Involvement Pertinent to Current Situation/Hospitalization:  No - Comment as needed Significant Relationships:  Adult Children Lives with:  Facility Resident Do you feel safe going back to the place where you live?  Yes Need for family participation in patient care:  Yes (Comment)  Care giving concerns:  Patient is a [place]-term care resident at [facility].   Social Worker assessment / plan: [place] representative (CSW) received nursing home consult. Patient is a [place]-term care resident at [facility]. Social work Theatre manager met with patient alone at bedside. Patient was sitting up in bed alert and oriented x4. Social work Theatre manager introduced self and explained the role of the [place]. Patient shared she has been living at [facility] for a year and son Delfino Lovett [phone]) is HPOA. Patient also shared that she uses a wheelchair at baseline and is agreeable to return. Social work Theatre manager contacted patient's son Delfino Lovett who confirmed above. Per Delfino Lovett he shares HPOA with patient's daughter Linwood Dibbles [phone]) and they are agreeable for patient to return to [place]. FL2 completed.  CSW contacted [facility] admission coordinator at [facility], who stated that patient is a [place]-term care resident and can return when discharged.  CSW and social work Theatre manager will continue to follow up and assist.  Employment status:  Retired Forensic scientist:  [place]icaid [place], [employer] PT Recommendations:  Not assessed at this time Information / Referral to community resources:  [place]  Patient/Family's Response to care:  Patient is agreeable to return to [facility] pending discharge.  Patient/Family's Understanding of and Emotional Response to Diagnosis, Current Treatment, and Prognosis: Patient and patient's son Delfino Lovett were both pleasant and thanked social work Theatre manager for assistance.  Emotional Assessment Appearance:  Appears stated age Attitude/Demeanor/Rapport:    Affect (typically observed):  Accepting, Calm, Pleasant Orientation:  Oriented to Self, Oriented to Place, Oriented to  Time, Oriented to Situation Alcohol / Substance use:  Not Applicable Psych involvement (Current and /or in the community):  No (Comment)  Discharge Needs  Concerns to be addressed:  Discharge Planning Concerns, Care Coordination Readmission within the last 30 days:  No Current discharge risk:  Dependent with Mobility Barriers to Discharge:  Continued Medical Work up   Smith Mince, Student-Social Work 02/17/2017, 4:19 PM

## 2017-02-17 NOTE — Progress Notes (Signed)
Patients [place] had fell out of nose, [place] replaced @ 2L after neb and sat was 97% after nebulizer.

## 2017-02-17 NOTE — NC FL2 (Signed)
[place] LEVEL OF CARE SCREENING TOOL     IDENTIFICATION  Patient Name: Kathryn Hayes Birthdate: 1934/11/29 Sex: female Admission Date (Current Location): 02/14/2017  [place] and [place] Number:  Engineering geologist and Address:  [facility], [address], [place], [place] [address]      Provider Number: [id number]  Attending Physician Name and Address:  Demetrios Loll, MD  Relative Name and Phone Number:       Current Level of Care: Hospital Recommended Level of Care: [place] Prior Approval Number:    Date Approved/Denied:   PASRR Number: [id number])  Discharge Plan: SNF    Current Diagnoses: Patient Active Problem List   Diagnosis Date Noted  . Acute respiratory failure with hypoxia ([place]) 02/14/2017  . Dependence on supplemental oxygen 03/25/2016  . Primary central sleep apnea 03/25/2016  . Dependence on other enabling machines and devices 03/25/2016  . Presence of cardiac pacemaker 03/25/2016  . Bradycardia, sinus 03/25/2016  . Second degree AV block 03/25/2016  . Major depression, single episode 03/25/2016  . Anxiety disorder 03/25/2016  . Vitamin D deficiency 03/25/2016  . Insomnia 03/25/2016  . GERD (gastroesophageal reflux disease) 03/25/2016  . Syndrome of inappropriate antidiuretic hormone ([place]) 03/25/2016  . Migraine without status migrainosus, not intractable 03/25/2016  . Personal history of other malignant neoplasm of large intestine 03/25/2016  . Personal history of malignant neoplasm of breast 03/25/2016  . HTN (hypertension) 12/16/2015  . CHF (congestive heart failure) ([place]) 08/16/2015  . 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 Barrett's esophagus 01/05/2012  . Hypoxemic respiratory failure, chronic ([place]) 03/10/2011  . COPD (chronic obstructive pulmonary disease) ([place])   . Hypertensive heart disease with CHF (congestive heart failure) ([place])   . Hyperlipidemia, unspecified   . Type 2 diabetes,  controlled, with neuropathy (HCC)     Orientation RESPIRATION BLADDER Height & Weight     Self, Time, Situation, Place  O2(2 Liters Oxygen. ) Continent Weight: 215 lb (97.5 kg) Height:  5\' 4"  (162.6 cm)  BEHAVIORAL SYMPTOMS/MOOD NEUROLOGICAL BOWEL NUTRITION STATUS      Continent Diet(Diet: Carb Modified. )  AMBULATORY STATUS COMMUNICATION OF NEEDS Skin   Extensive Assist Verbally Normal                       Personal Care Assistance Level of Assistance  Bathing, Feeding, Dressing Bathing Assistance: Limited assistance Feeding assistance: Independent Dressing Assistance: Limited assistance     Functional Limitations Info  Sight, Hearing, Speech Sight Info: Adequate Hearing Info: Adequate Speech Info: Adequate    SPECIAL CARE FACTORS FREQUENCY  PT (By licensed PT), OT (By licensed OT)     PT Frequency: (5) OT Frequency: (5)            Contractures      Additional Factors Info  Code Status, Allergies Code Status Info: (Full Code. ) Allergies Info: (No Known Allergies. )           Current Medications (02/17/2017):  This is the current hospital active medication list Current Facility-Administered Medications  Medication Dose Route Frequency Provider Last Rate Last Dose  . acetaminophen (TYLENOL) tablet 650 mg  650 mg Oral Q6H PRN Demetrios Loll, MD       Or  . acetaminophen (TYLENOL) suppository 650 mg  650 mg Rectal Q6H PRN Demetrios Loll, MD      . albuterol (PROVENTIL) (2.5 MG/3ML) 0.083% nebulizer solution 2.5 mg  2.5 mg Nebulization Q2H PRN Bridgett Larsson,  Qing, MD   2.5 mg at 02/15/17 1406  . bisacodyl (DULCOLAX) EC tablet 5 mg  5 mg Oral Daily PRN Demetrios Loll, MD   5 mg at 02/17/17 1558  . budesonide (PULMICORT) nebulizer solution 0.5 mg  0.5 mg Nebulization BID Flora Lipps, MD   0.5 mg at 02/17/17 0729  . DULoxetine (CYMBALTA) DR capsule 60 mg  60 mg Oral Daily Flora Lipps, MD   60 mg at 02/17/17 3790  . enoxaparin (LOVENOX) injection 40 mg  40 mg Subcutaneous Q24H  Demetrios Loll, MD   40 mg at 02/16/17 2255  . furosemide (LASIX) tablet 20 mg  20 mg Oral Daily Flora Lipps, MD   20 mg at 02/17/17 2409  . guaiFENesin-dextromethorphan (ROBITUSSIN DM) 100-10 MG/5ML syrup 5 mL  5 mL Oral Q4H PRN Flora Lipps, MD   5 mL at 02/17/17 0026  . HYDROcodone-acetaminophen (NORCO/VICODIN) 5-325 MG per tablet 1-2 tablet  1-2 tablet Oral Q4H PRN Demetrios Loll, MD   1 tablet at 02/17/17 0544  . insulin aspart (novoLOG) injection 0-5 Units  0-5 Units Subcutaneous QHS Kasa, Kurian, MD      . insulin aspart (novoLOG) injection 0-9 Units  0-9 Units Subcutaneous TID WC Flora Lipps, MD   2 Units at 02/17/17 1200  . ipratropium-albuterol (DUONEB) 0.5-2.5 (3) MG/3ML nebulizer solution 3 mL  3 mL Nebulization Q4H Flora Lipps, MD   3 mL at 02/17/17 1524  . LORazepam (ATIVAN) tablet 0.5 mg  0.5 mg Oral TID PRN Demetrios Loll, MD   0.5 mg at 02/17/17 1551  . Melatonin TABS 5 mg  5 mg Oral QHS PRN Flora Lipps, MD      . menthol-cetylpyridinium (CEPACOL) lozenge 3 mg  1 lozenge Oral PRN Flora Lipps, MD   3 mg at 02/16/17 1050  . ondansetron (ZOFRAN) tablet 4 mg  4 mg Oral Q6H PRN Demetrios Loll, MD       Or  . ondansetron [facility]) injection 4 mg  4 mg Intravenous Q6H PRN Demetrios Loll, MD      . pantoprazole (PROTONIX) EC tablet 40 mg  40 mg Oral Daily Flora Lipps, MD   40 mg at 02/17/17 7353  . [START ON 02/18/2017] predniSONE (DELTASONE) tablet 40 mg  40 mg Oral Q breakfast Demetrios Loll, MD      . senna-docusate (Senokot-S) tablet 1 tablet  1 tablet Oral QHS PRN Demetrios Loll, MD      . traZODone (DESYREL) tablet 100 mg  100 mg Oral QHS Awilda Bill, NP   100 mg at 02/16/17 2254     Discharge Medications: Please see discharge summary for a list of discharge medications.  Relevant Imaging Results:  Relevant Lab Results:   Additional Information (SSN: [id number])  Ryonna Cimini, Veronia Beets, LCSW

## 2017-02-17 NOTE — Progress Notes (Signed)
[facility] NAME: Kathryn Hayes    MR#:  [id number]  DATE OF BIRTH:  09/19/1934  SUBJECTIVE:   Patient here due to shortness of breath and noted to be in acute on chronic respiratory failure with hypoxia and hypercapnia secondary to COPD exacerbation. Patient was also initially hypotensive but did not require pressors.  Patient is off the BiPAP and on O2  2L. She feels much better.  REVIEW OF SYSTEMS:    Review of Systems  Constitutional: Positive for malaise/fatigue. Negative for chills and fever.  HENT: Negative for congestion and tinnitus.   Eyes: Negative for blurred vision and double vision.  Respiratory: Positive for cough and shortness of breath. Negative for wheezing.   Cardiovascular: Negative for chest pain, orthopnea and PND.  Gastrointestinal: Negative for abdominal pain, diarrhea, nausea and vomiting.  Genitourinary: Negative for dysuria and hematuria.  Neurological: Positive for weakness. Negative for dizziness, sensory change and focal weakness.  All other systems reviewed and are negative.   Nutrition: Soft diet Tolerating Diet: Yes Tolerating PT: Await Eval.    DRUG ALLERGIES:  No Known Allergies  VITALS:  Blood pressure (!) 142/72, pulse 62, temperature 98.3 F (36.8 C), temperature source Oral, resp. rate 18, height 5\' 4"  (1.626 m), weight 215 lb (97.5 kg), SpO2 (!) 83 %.  PHYSICAL EXAMINATION:   Physical Exam  GENERAL:  83 y.o.-year-old obese patient lying in bed in no acute distress.  EYES: Pupils equal, round, reactive to light and accommodation. No scleral icterus. Extraocular muscles intact.  HEENT: Head atraumatic, normocephalic. Oropharynx and nasopharynx clear.  NECK:  Supple, no jugular venous distention. No thyroid enlargement, no tenderness.  LUNGS: Prolonged Insp. & Exp. phase, tiny wheezing, no rales, rhonchi. No use of accessory muscles of respiration.  CARDIOVASCULAR: S1, S2 normal. No  murmurs, rubs, or gallops.  ABDOMEN: Soft, nontender, nondistended. Bowel sounds present. No organomegaly or mass.  EXTREMITIES: No cyanosis, clubbing or edema b/l.    NEUROLOGIC: Cranial nerves II through XII are intact. No focal Motor or sensory deficits b/l.  Globally weak.  PSYCHIATRIC: The patient is alert and oriented x 3.  SKIN: No obvious rash, lesion, or ulcer.    LABORATORY PANEL:   CBC Recent Labs  Lab 02/15/17 0631  WBC 8.5  HGB 10.7*  HCT 31.6*  PLT 250   ------------------------------------------------------------------------------------------------------------------  Chemistries  Recent Labs  Lab 02/14/17 2008  02/16/17 1151 02/17/17 0553  NA 120*   < > 126* 125*  K 4.7   < > 4.2 4.2  CL 75*   < > 81* 81*  CO2 33*   < > 33* 32  GLUCOSE 214*   < > 129* 146*  BUN 18   < > 12 10  CREATININE 0.59   < > 0.42* 0.52  CALCIUM 8.8*   < > 9.0 8.8*  MG  --    < > 1.7  --   AST 28  --   --   --   ALT 23  --   --   --   ALKPHOS 51  --   --   --   BILITOT 0.7  --   --   --    < > = values in this interval not displayed.   ------------------------------------------------------------------------------------------------------------------  Cardiac Enzymes Recent Labs  Lab 02/14/17 2008  TROPONINI <0.03   ------------------------------------------------------------------------------------------------------------------  RADIOLOGY:  Dg Abd 1 View  Result Date: 02/16/2017 CLINICAL DATA:  Abdominal  distention. EXAM: ABDOMEN - 1 VIEW COMPARISON:  None. FINDINGS: There is a moderately large amount of stool in the proximal colon. A small amount of stool is noted more distally in the colon, and there is mild gaseous distension of the transverse and descending colon. No dilated small bowel loops are seen to suggest obstruction. No intraperitoneal free air is identified on this supine study. The visualized lung bases are grossly clear. Pacemaker leads and cardiomegaly are  noted. No acute osseous abnormality is identified. IMPRESSION: Moderately large amount of proximal colonic stool. Mild left-sided colonic gaseous distention without evidence of obstruction. Electronically Signed   By: Logan Bores M.D.   On: 02/16/2017 07:28     ASSESSMENT AND PLAN:   83 year old female with past medical history obstructive sleep apnea, obesity, hypertension, hyperlipidemia, history of colon cancer breast cancer diabetes COPD depression who presents to the hospital due to shortness of breath and noted to be in acute on chronic respiratory failure with hypoxia and hypercapnia.  1. Acute on chronic respiratory failure with hypoxia and hypercapnia-secondary to COPD exacerbation. -Initially patient was on BiPAP but now has been weaned off of it. -Continue O2 supplementation, CPAP at bedtime. Continuous steroids, DuoNeb nebs, Pulmicort nebs and Z-Pak.  2. COPD exacerbation-this was the cause of patient's worsening respiratory failure. -Much improved since yesterday as patient has been weaned off BiPAP. Continue IV steroids, scheduled DuoNeb's, Pulmicort and a Z-Pak. Chest x-ray was negative for acute pathology.  3. Hyponatremia-etiology unclear. Questionable SIADH related to pulmonary process/volume depletion. She was given gentle IV fluids, now on Lasix. Sodium is 125.  Patient is clinically asymptomatic.  Fluid restriction.  Follow BMP.  4. GERD-continue Protonix.  5. Hypomagnesemia-patient was given IV mag sulfate.  Improving.  6. Depression- continue Cymbalta.  7. Hyperglycemia - due to steroids.  Cont. SSI.  Improved.  Hypotension secondary to sepsis -resolved.  Generalized weakness.  PT evaluation: Has not been ambulatory for approx 2 years.  All the records are reviewed and case discussed with Care Management/Social Worker. Management plans discussed with the patient, family and they are in agreement.  CODE STATUS: Full code  DVT Prophylaxis: Lovenox  TOTAL  TIME TAKING CARE OF THIS PATIENT: 81 minutes.   POSSIBLE D/C to SNF IN 1-2 DAYS, DEPENDING ON CLINICAL CONDITION.   Demetrios Loll M.D on 02/17/2017 at 2:24 PM  Between 7am to 6pm - Pager - [phone]  After 6pm go to [email or web address] - Proofreader  [facility]  Office  [phone]  CC: Primary care physician; Juluis Pitch, MD

## 2017-02-18 ENCOUNTER — Telehealth: Payer: Self-pay | Admitting: *Deleted

## 2017-02-18 LAB — CULTURE, BLOOD (ROUTINE X 2): Special Requests: ADEQUATE

## 2017-02-18 LAB — BASIC METABOLIC PANEL
ANION GAP: 18 — AB (ref 5–15)
BUN: 10 mg/dL (ref 6–20)
CO2: 30 mmol/L (ref 22–32)
Calcium: 9.1 mg/dL (ref 8.9–10.3)
Chloride: 79 mmol/L — ABNORMAL LOW (ref 101–111)
Creatinine, Ser: 0.44 mg/dL (ref 0.44–1.00)
GFR calc Af Amer: >60 mL/min (ref >60)
Glucose, Bld: 122 mg/dL — ABNORMAL HIGH (ref 65–99)
Potassium: 4.3 mmol/L (ref 3.5–5.1)
SODIUM: 127 mmol/L — AB (ref 135–145)

## 2017-02-18 LAB — GLUCOSE, CAPILLARY
GLUCOSE-CAPILLARY: 103 mg/dL — AB (ref 65–99)
Glucose-Capillary: 203 mg/dL — ABNORMAL HIGH (ref 65–99)

## 2017-02-18 MED ORDER — GUAIFENESIN-DM 100-10 MG/5ML PO SYRP: 5.0000 mL | ORAL_SOLUTION | ORAL | 0 refills | Status: AC | PRN

## 2017-02-18 MED ORDER — PREDNISONE 10 MG PO TABS: ORAL_TABLET | ORAL | Status: DC

## 2017-02-18 MED ORDER — BISACODYL 5 MG PO TBEC: 10.0000 mg | DELAYED_RELEASE_TABLET | Freq: Every day | ORAL | 0 refills | Status: AC | PRN

## 2017-02-18 MED ORDER — BISACODYL 5 MG PO TBEC
10.0000 mg | DELAYED_RELEASE_TABLET | Freq: Every day | ORAL | Status: DC | PRN
  Administered 2017-02-18: 09:00:00 10 mg via ORAL
  Filled 2017-02-18: qty 2

## 2017-02-18 NOTE — Care Management Important Message (Signed)
Important Message  Patient Details  Name: Kathryn Hayes MRN: [id number] Date of Birth: 1934/07/20   Medicare Important Message Given:  Yes No notice in Epic. Notice signed, one given to patient and the other to HIM for scanning  Katrina Stack, RN 02/18/2017, 10:05 AM

## 2017-02-18 NOTE — Discharge Summary (Signed)
[place] [facility] [place] NAME: Kathryn Hayes    MR#:  [id number]  DATE OF BIRTH:  [birth year]  DATE OF ADMISSION:  02/14/2017   ADMITTING PHYSICIAN: Demetrios Loll, MD  DATE OF DISCHARGE: 02/18/2017  PRIMARY CARE PHYSICIAN: Juluis Pitch, MD   ADMISSION DIAGNOSIS:  Hyponatremia [E87.1] COPD exacerbation (HCC) [J44.1] Hypotension, unspecified hypotension type [I95.9] Altered mental status, unspecified altered mental status type [R41.82] DISCHARGE DIAGNOSIS:  Active Problems:   Acute respiratory failure with hypoxia ([place])  SECONDARY DIAGNOSIS:   Past Medical History:  Diagnosis Date  . Anxiety    unspecified  . Arthritis   . Breast cancer ([place]) 2007   Early stage left breast cancer status post partial colectomy, 04/2005 (Dr. Oliva Bustard)  . Chest pain, unspecified   . CHF (congestive heart failure) ([place])   . Chronic headache   . Colon cancer ([place])    status post partial colectomy, diagnosed 2000  . Colon neoplasm   . COPD (chronic obstructive pulmonary disease) ([place])   . Depression    unspecified  . Diabetes mellitus   . Fibrocystic breast   . History of breast cancer   . History of colon cancer   . HTN (hypertension) with goal to be determined   . Hyperlipidemia   . Hyperlipidemia, unspecified   . Hypertension   . Obesity, unspecified   . Sleep apnea    mild to moderate   HOSPITAL COURSE:   83 year old female with past medical history obstructive sleep apnea, obesity, hypertension, hyperlipidemia, history of colon cancer breast cancer diabetes COPD depression who presents to the hospital due to shortness of breath and noted to be in acute on chronic respiratory failure with hypoxia and hypercapnia.  1. Acute on chronic respiratory failure with hypoxia and hypercapnia-secondary to COPD exacerbation. -Initially patient was on BiPAP. -Continue O2 supplementation, CPAP at bedtime She was treated with IV Solu-Medrol, changed to p.o.  prednisone taper, DuoNeb nebs, Pulmicort nebs.  2. COPD exacerbation-this was the cause of patient's worsening respiratory failure. -Much improved since yesterday as patient was weaned off BiPAP.  Changed IV steroids to prednisone p.o. taper,  DuoNeb's, Pulmicort. Chest x-ray was negative for acute pathology.  3. Hyponatremia-etiology unclear. Questionable SIADH related to pulmonary process/volume depletion.  Looks like chronic. She was given gentle IV fluids, now on Lasix. Sodium is better at 127.  Patient is clinically asymptomatic.  Fluid restriction.  Follow BMP as outpatient.  4. GERD-continue Protonix.  5. Hypomagnesemia-patient was given IV mag sulfate.  Improving.  6. Depression- continue Cymbalta.  7. Hyperglycemia - due to steroids.  Cont. SSI.  Improved.  Hypotension secondary to sepsis-resolved.  Generalized weakness.  PT evaluation: Has not been ambulatory for approx 2 years. DISCHARGE CONDITIONS:  Stable, discharge to SNF today. CONSULTS OBTAINED:  Treatment Team:  [facility], [facility], MD DRUG ALLERGIES:  No Known Allergies DISCHARGE MEDICATIONS:   Allergies as of 02/18/2017   No Known Allergies     Medication List    STOP taking these medications   demeclocycline 300 MG tablet Commonly known as:  DECLOMYCIN   meloxicam 7.5 MG tablet Commonly known as:  MOBIC     TAKE these medications   acetaminophen 325 MG tablet Commonly known as:  TYLENOL Take 650 mg by mouth every 4 (four) hours as needed for mild pain, moderate pain or fever. Do not exceed 3000 mg in 24hrs What changed:  Another medication with the same name was removed. Continue taking this  medication, and follow the directions you see here.   albuterol (2.5 MG/3ML) 0.083% nebulizer solution Commonly known as:  PROVENTIL Take 2.5 mg by nebulization every 6 (six) hours as needed for wheezing or shortness of breath.   aspirin EC 81 MG tablet Take 81 mg by mouth daily.   bisacodyl 5 MG  EC tablet Commonly known as:  DULCOLAX Take 2 tablets (10 mg total) by mouth daily as needed for moderate constipation.   Cholecalciferol 2000 units Caps Take 2,000 Units by mouth daily.   COMBIVENT RESPIMAT 20-100 MCG/ACT Aers respimat Generic drug:  Ipratropium-Albuterol Inhale 1 puff into the lungs 4 (four) times daily.   divalproex 125 MG capsule Commonly known as:  DEPAKOTE SPRINKLE Take 250 mg by mouth 2 (two) times daily.   docusate sodium 100 MG capsule Commonly known as:  COLACE Take 100 mg by mouth daily.   DULoxetine 60 MG capsule Commonly known as:  CYMBALTA Take 60 mg by mouth daily.   eucerin cream Apply liberal amount topically to dry areas of skin daily after bath and as needed. Ok to keep at bedside   fluticasone-salmeterol 115-21 MCG/ACT inhaler Commonly known as:  ADVAIR HFA Inhale 2 puffs into the lungs 2 (two) times daily. 8 am and 8 pm   furosemide 20 MG tablet Commonly known as:  LASIX Take 20 mg by mouth daily.   gabapentin 400 MG capsule Commonly known as:  NEURONTIN Take 400 mg by mouth 3 (three) times daily.   glipiZIDE-metformin 2.5-500 MG tablet Commonly known as:  METAGLIP Take 1 tablet by mouth daily.   guaiFENesin-dextromethorphan 100-10 MG/5ML syrup Commonly known as:  ROBITUSSIN DM Take 5 mLs by mouth every 4 (four) hours as needed for cough.   ketotifen 0.025 % ophthalmic solution Commonly known as:  ZADITOR Place 1 drop into both eyes 2 (two) times daily.   LORazepam 0.5 MG tablet Commonly known as:  ATIVAN Take 1 tablet (0.5 mg total) by mouth at bedtime.   magnesium oxide 400 MG tablet Commonly known as:  MAG-OX Take 400 mg by mouth 2 (two) times daily.   Melatonin 10 MG Tabs Take 10 mg by mouth at bedtime.   Menthol 1 MG Lozg Use as directed 1 lozenge in the mouth or throat every 6 (six) hours as needed. For irritation/pain   nystatin powder Commonly known as:  MYCOSTATIN/NYSTOP Apply 1 application topically to  abdominal folds daily for redness and irritation.   pantoprazole 20 MG tablet Commonly known as:  PROTONIX Take 20 mg by mouth daily.   potassium chloride 10 MEQ tablet Commonly known as:  K-DUR,KLOR-CON Take 10 mEq by mouth every other day.   pravastatin 10 MG tablet Commonly known as:  PRAVACHOL Take 10 mg by mouth daily at 6 PM. On Sunday   predniSONE 10 MG tablet Commonly known as:  DELTASONE 30 mg po daily for 1 day, 20 mg po daily for 1 day, 10 mg po daily for 1 day.   SENIOR TABS Tabs Take 1 tablet by mouth daily.   senna-docusate 8.6-50 MG tablet Commonly known as:  Senokot-S Take 1 tablet by mouth 2 (two) times daily.   sucralfate 1 g tablet Commonly known as:  CARAFATE Take 1 g by mouth daily.   trandolapril 2 MG tablet Commonly known as:  MAVIK Take 2 mg by mouth daily.   traZODone 100 MG tablet Commonly known as:  DESYREL Take 1 tablet (100 mg total) by mouth at bedtime.  DISCHARGE INSTRUCTIONS:  See AVS.  If you experience worsening of your admission symptoms, develop shortness of breath, life threatening emergency, suicidal or homicidal thoughts you must seek medical attention immediately by calling 911 or calling your MD immediately  if symptoms less severe.  You Must read complete instructions/literature along with all the possible adverse reactions/side effects for all the Medicines you take and that have been prescribed to you. Take any new Medicines after you have completely understood and accpet all the possible adverse reactions/side effects.   Please note  You were cared for by a hospitalist during your hospital stay. If you have any questions about your discharge medications or the care you received while you were in the hospital after you are discharged, you can call the unit and asked to speak with the hospitalist on call if the hospitalist that took care of you is not available. Once you are discharged, your primary care physician will  handle any further medical issues. Please note that NO REFILLS for any discharge medications will be authorized once you are discharged, as it is imperative that you return to your primary care physician (or establish a relationship with a primary care physician if you do not have one) for your aftercare needs so that they can reassess your need for medications and monitor your lab values.    On the day of Discharge:  VITAL SIGNS:  Blood pressure 131/87, pulse (!) 114, temperature 98.5 F (36.9 C), temperature source Axillary, resp. rate (!) 21, height 5\' 4"  (1.626 m), weight 215 lb (97.5 kg), SpO2 90 %. PHYSICAL EXAMINATION:  GENERAL:  83 y.o.-year-old patient lying in the bed with no acute distress.  Obesity. EYES: Pupils equal, round, reactive to light and accommodation. No scleral icterus. Extraocular muscles intact.  HEENT: Head atraumatic, normocephalic. Oropharynx and nasopharynx clear.  NECK:  Supple, no jugular venous distention. No thyroid enlargement, no tenderness.  LUNGS: Normal breath sounds bilaterally, no wheezing, rales,rhonchi or crepitation. No use of accessory muscles of respiration.  CARDIOVASCULAR: S1, S2 normal. No murmurs, rubs, or gallops.  ABDOMEN: Soft, non-tender, non-distended. Bowel sounds present. No organomegaly or mass.  EXTREMITIES: No pedal edema, cyanosis, or clubbing.  NEUROLOGIC: Cranial nerves II through XII are intact. Muscle strength 2-3/5 in all extremities. Sensation intact. Gait not checked.  PSYCHIATRIC: The patient is alert and oriented x 3.  SKIN: No obvious rash, lesion, or ulcer.  DATA REVIEW:   CBC Recent Labs  Lab 02/15/17 0631  WBC 8.5  HGB 10.7*  HCT 31.6*  PLT 250    Chemistries  Recent Labs  Lab 02/14/17 2008  02/16/17 1151  02/18/17 0411  NA 120*   < > 126*   < > 127*  K 4.7   < > 4.2   < > 4.3  CL 75*   < > 81*   < > 79*  CO2 33*   < > 33*   < > 30  GLUCOSE 214*   < > 129*   < > 122*  BUN 18   < > 12   < > 10    CREATININE 0.59   < > 0.42*   < > 0.44  CALCIUM 8.8*   < > 9.0   < > 9.1  MG  --    < > 1.7  --   --   AST 28  --   --   --   --   ALT 23  --   --   --   --  ALKPHOS 51  --   --   --   --   BILITOT 0.7  --   --   --   --    < > = values in this interval not displayed.     Microbiology Results  Results for orders placed or performed during the hospital encounter of 02/14/17  Blood culture (routine x 2)     Status: None (Preliminary result)   Collection Time: 02/14/17  7:24 PM  Result Value Ref Range Status   Specimen Description BLOOD RIGHT WRIST  Final   Special Requests   Final    BOTTLES DRAWN AEROBIC AND ANAEROBIC Blood Culture adequate volume   Culture   Final    NO GROWTH 4 DAYS Performed at [facility], [address], [place], [place] [address]    Report Status PENDING  Incomplete  Blood culture (routine x 2)     Status: Abnormal (Preliminary result)   Collection Time: 02/14/17  7:24 PM  Result Value Ref Range Status   Specimen Description   Final    BLOOD BLOOD LEFT HAND Performed at [facility], [place] [address] [place], [place] [address]    Special Requests   Final    BOTTLES DRAWN AEROBIC AND ANAEROBIC Blood Culture adequate volume Performed at [address] [place], [place] [address]    Culture  Setup Time   Final    GRAM POSITIVE COCCI IN BOTH AEROBIC AND ANAEROBIC BOTTLES DAVID BESANTI AT 9476 ON 02/15/17 BY MLK.Marland KitchenMarland KitchenTwin Lakes Regional [facility] Center    Culture (A)  Final    STAPHYLOCOCCUS SPECIES (COAGULASE NEGATIVE) THE SIGNIFICANCE OF ISOLATING THIS ORGANISM FROM A SINGLE SET OF BLOOD CULTURES WHEN MULTIPLE SETS ARE DRAWN IS UNCERTAIN. PLEASE NOTIFY THE MICROBIOLOGY DEPARTMENT WITHIN ONE WEEK IF SPECIATION AND SENSITIVITIES ARE REQUIRED. Performed at [facility], [place] [address], [place], [place] [address]    Report Status PENDING  Incomplete  Urine culture     Status: Abnormal   Collection Time: 02/14/17  7:24 PM  Result Value Ref  Range Status   Specimen Description   Final    URINE, RANDOM Performed at [facility], [address] [place], [place] [address]    Special Requests   Final    NONE Performed at [facility], [place] [place], [place] [address]    Culture MULTIPLE SPECIES PRESENT, SUGGEST RECOLLECTION (A)  Final   Report Status 02/16/2017 FINAL  Final  Blood Culture ID Panel (Reflexed)     Status: Abnormal   Collection Time: 02/14/17  7:24 PM  Result Value Ref Range Status   Enterococcus species NOT DETECTED NOT DETECTED Final   Vancomycin resistance NOT DETECTED NOT DETECTED Final   Listeria monocytogenes NOT DETECTED NOT DETECTED Final   Staphylococcus species DETECTED (A) NOT DETECTED Final    Comment: CRITICAL RESULT CALLED TO, READ BACK BY AND VERIFIED WITH: DAVID BESANTI 02/15/17 @ 2354  MLK Methicillin (oxacillin) resistant coagulase negative staphylococcus. Possible blood culture contaminant (unless isolated from more than one blood culture draw or clinical case suggests pathogenicity). No antibiotic treatment is indicated for blood  culture contaminants.    Staphylococcus aureus NOT DETECTED NOT DETECTED Final   Methicillin resistance DETECTED (A) NOT DETECTED Final    Comment: CRITICAL RESULT CALLED TO, READ BACK BY AND VERIFIED WITH: DAVID BESANTI 02/15/17 @ 5170  MLK    Streptococcus species NOT DETECTED NOT DETECTED Final   Streptococcus agalactiae NOT DETECTED NOT  DETECTED Final   Streptococcus pneumoniae NOT DETECTED NOT DETECTED Final   Streptococcus pyogenes NOT DETECTED NOT DETECTED Final   Acinetobacter baumannii NOT DETECTED NOT DETECTED Final   Enterobacteriaceae species NOT DETECTED NOT DETECTED Final   Enterobacter cloacae complex NOT DETECTED NOT DETECTED Final   Escherichia coli NOT DETECTED NOT DETECTED Final   Klebsiella oxytoca NOT DETECTED NOT DETECTED Final   Klebsiella pneumoniae NOT DETECTED NOT DETECTED Final   Proteus species NOT  DETECTED NOT DETECTED Final   Serratia marcescens NOT DETECTED NOT DETECTED Final   Carbapenem resistance NOT DETECTED NOT DETECTED Final   Haemophilus influenzae NOT DETECTED NOT DETECTED Final   Neisseria meningitidis NOT DETECTED NOT DETECTED Final   Pseudomonas aeruginosa NOT DETECTED NOT DETECTED Final   Candida albicans NOT DETECTED NOT DETECTED Final   Candida glabrata NOT DETECTED NOT DETECTED Final   Candida krusei NOT DETECTED NOT DETECTED Final   Candida parapsilosis NOT DETECTED NOT DETECTED Final   Candida tropicalis NOT DETECTED NOT DETECTED Final    Comment: Performed at [facility], [address] [place], [place] [address]  MRSA PCR Screening     Status: None   Collection Time: 02/14/17 10:21 PM  Result Value Ref Range Status   MRSA by PCR NEGATIVE NEGATIVE Final    Comment:        The GeneXpert MRSA Assay (FDA approved for NASAL specimens only), is one component of a comprehensive MRSA colonization surveillance program. It is not intended to diagnose MRSA infection nor to guide or monitor treatment for MRSA infections. Performed at [facility], [address], [place],  [address]     RADIOLOGY:  No results found.   Management plans discussed with the patient, family and they are in agreement.  CODE STATUS: Full Code   TOTAL TIME TAKING CARE OF THIS PATIENT: 33 minutes.    Demetrios Loll M.D on 02/18/2017 at 8:43 AM  Between 7am to 6pm - Pager - [phone]  After 6pm go to [email or web address] - Proofreader  [facility]  Office  [phone]  CC: Primary care physician; Juluis Pitch, MD   Note: This dictation was prepared with Dragon dictation along with smaller phrase technology. Any transcriptional errors that result from this process are unintentional.

## 2017-02-18 NOTE — Progress Notes (Signed)
EMS called for transport. Elisha Mcgruder S, RN  

## 2017-02-18 NOTE — Progress Notes (Signed)
* [facility]     Assessment and Plan:  83 yo white female admitted for acute resp failure from COPD exacerbation from acute bronchitis with CHF exacerbation Acute on chronic hypercapnic respiratory failure which may be secondary to COPD. -on prednisone 40 mg daily, wean down as tolerated. -Wean oxygen as tolerated, advance activity as tolerated. - Incentive spirometry while awake. - Outpatient sleep study to rule out sleep apnea as a cause of the patient's hypoventilation.   Date: 02/18/2017  MRN# [id number] Kathryn Hayes 10-19-1934   Kathryn Hayes is a 83 y.o. old female seen in follow up for chief complaint of  Chief Complaint  Patient presents with  . Respiratory Distress     HPI:  Patient feels that she is doing better today, feels constipated today.  Remains on 2 L nasal cannula.  Review of metabolic panel shows persistently elevated CO2 consistent with hypercapnia in addition patient's ABG shows a pH of 7.41/60 1/60 6/38.7, consistent with chronic hypercapnic respiratory failure.  Medication:    Current Facility-Administered Medications:  .  acetaminophen (TYLENOL) tablet 650 mg, 650 mg, Oral, Q6H PRN **OR** acetaminophen (TYLENOL) suppository 650 mg, 650 mg, Rectal, Q6H PRN, Demetrios Loll, MD .  albuterol (PROVENTIL) (2.5 MG/3ML) 0.083% nebulizer solution 2.5 mg, 2.5 mg, Nebulization, Q2H PRN, Demetrios Loll, MD, 2.5 mg at 02/15/17 1406 .  bisacodyl (DULCOLAX) EC tablet 10 mg, 10 mg, Oral, Daily PRN, Demetrios Loll, MD, 10 mg at 02/18/17 0849 .  budesonide (PULMICORT) nebulizer solution 0.5 mg, 0.5 mg, Nebulization, BID, Kasa, Kurian, MD, 0.5 mg at 02/18/17 0753 .  DULoxetine (CYMBALTA) DR capsule 60 mg, 60 mg, Oral, Daily, Kasa, Kurian, MD, 60 mg at 02/18/17 0849 .  enoxaparin (LOVENOX) injection 40 mg, 40 mg, Subcutaneous, Q24H, Demetrios Loll, MD, 40 mg at 02/17/17 2149 .  furosemide (LASIX) tablet 20 mg, 20 mg, Oral, Daily, Flora Lipps, MD, 20 mg at 02/18/17  0849 .  guaiFENesin-dextromethorphan (ROBITUSSIN DM) 100-10 MG/5ML syrup 5 mL, 5 mL, Oral, Q4H PRN, Flora Lipps, MD, 5 mL at 02/17/17 0026 .  HYDROcodone-acetaminophen (NORCO/VICODIN) 5-325 MG per tablet 1-2 tablet, 1-2 tablet, Oral, Q4H PRN, Demetrios Loll, MD, 1 tablet at 02/17/17 0544 .  insulin aspart (novoLOG) injection 0-5 Units, 0-5 Units, Subcutaneous, QHS, Kasa, Kurian, MD .  insulin aspart (novoLOG) injection 0-9 Units, 0-9 Units, Subcutaneous, TID WC, Flora Lipps, MD, 3 Units at 02/18/17 1209 .  ipratropium-albuterol (DUONEB) 0.5-2.5 (3) MG/3ML nebulizer solution 3 mL, 3 mL, Nebulization, Q4H, Kasa, Kurian, MD, 3 mL at 02/18/17 1124 .  LORazepam (ATIVAN) tablet 0.5 mg, 0.5 mg, Oral, TID PRN, Demetrios Loll, MD, 0.5 mg at 02/17/17 2149 .  Melatonin TABS 5 mg, 5 mg, Oral, QHS PRN, Mortimer Fries, Kurian, MD .  menthol-cetylpyridinium (CEPACOL) lozenge 3 mg, 1 lozenge, Oral, PRN, Flora Lipps, MD, 3 mg at 02/16/17 1050 .  ondansetron (ZOFRAN) tablet 4 mg, 4 mg, Oral, Q6H PRN **OR** ondansetron (ZOFRAN) injection 4 mg, 4 mg, Intravenous, Q6H PRN, Demetrios Loll, MD .  pantoprazole (PROTONIX) EC tablet 40 mg, 40 mg, Oral, Daily, Kasa, Kurian, MD, 40 mg at 02/18/17 0849 .  predniSONE (DELTASONE) tablet 40 mg, 40 mg, Oral, Q breakfast, Demetrios Loll, MD, 40 mg at 02/18/17 0849 .  senna-docusate (Senokot-S) tablet 1 tablet, 1 tablet, Oral, QHS PRN, Demetrios Loll, MD .  traZODone (DESYREL) tablet 100 mg, 100 mg, Oral, QHS, Blakeney, Dreama Saa, NP, 100 mg at 02/17/17 2149   Allergies:  Patient has no  known allergies.  Review of Systems: Gen:  Denies  fever, sweats. HEENT: Denies blurred vision. Cvc:  No dizziness, chest pain or heaviness Resp:   Denies cough or sputum porduction. Gi: Denies swallowing difficulty, stomach pain. constipation, bowel incontinence Gu:  Denies bladder incontinence, burning urine Ext:   No Joint pain, stiffness. Skin: No skin rash, easy bruising. Endoc:  No polyuria, polydipsia. Psych: No  depression, insomnia. Other:  All other systems were reviewed and found to be negative other than what is mentioned in the HPI.   Physical Examination:   VS: BP 131/87 (BP Location: Left Arm)   Pulse (!) 114   Temp 98.5 F (36.9 C) (Axillary)   Resp (!) 21   Ht 5\' 4"  (1.626 m)   Wt 215 lb (97.5 kg)   SpO2 90%   BMI 36.90 kg/m    General Appearance: No distress  Neuro:without focal findings,  speech normal,  HEENT: PERRLA, EOM intact. Pulmonary: normal breath sounds, No wheezing.   CardiovascularNormal S1,S2.  No m/r/g.   Abdomen: Benign, Soft, non-tender. Renal:  No costovertebral tenderness  GU:  Not performed at this time. Endoc: No evident thyromegaly, no signs of acromegaly. Skin:   warm, no rash. Extremities: normal, no cyanosis, clubbing.   LABORATORY PANEL:   CBC Recent Labs  Lab 02/15/17 0631  WBC 8.5  HGB 10.7*  HCT 31.6*  PLT 250   ------------------------------------------------------------------------------------------------------------------  Chemistries  Recent Labs  Lab 02/14/17 2008  02/16/17 1151  02/18/17 0411  NA 120*   < > 126*   < > 127*  K 4.7   < > 4.2   < > 4.3  CL 75*   < > 81*   < > 79*  CO2 33*   < > 33*   < > 30  GLUCOSE 214*   < > 129*   < > 122*  BUN 18   < > 12   < > 10  CREATININE 0.59   < > 0.42*   < > 0.44  CALCIUM 8.8*   < > 9.0   < > 9.1  MG  --    < > 1.7  --   --   AST 28  --   --   --   --   ALT 23  --   --   --   --   ALKPHOS 51  --   --   --   --   BILITOT 0.7  --   --   --   --    < > = values in this interval not displayed.   ------------------------------------------------------------------------------------------------------------------  Cardiac Enzymes Recent Labs  Lab 02/14/17 2008  TROPONINI <0.03   ------------------------------------------------------------  RADIOLOGY:   No results found for this or any previous visit. Results for orders placed during the hospital encounter of 05/21/14  DG  Chest 2 View   Narrative CLINICAL DATA:  COPD and recent admission for pneumonia.  EXAM: CHEST - 2 VIEW  COMPARISON:  04/30/2014  FINDINGS: The small bilateral pleural effusions seen previously have essentially resolved. There is some residual scarring and atelectasis at the left lung base. Underlying chronic lung disease/COPD is stable. There is stable mild cardiac enlargement and appearance of a dual-chamber pacemaker. No edema or pneumothorax identified. Stable osteopenia and spondylosis of the thoracic spine.  IMPRESSION: Resolution of small bilateral pleural effusions. Scarring and atelectasis at the left lung base.   Electronically Signed   By: Jenness Corner.D.  On: 05/21/2014 17:38    ------------------------------------------------------------------------------------------------------------------  Thank  you for allowing [facility] to assist in the care of your patient. Our recommendations are noted above.  Please contact us if we can be of further service.   Marda Stalker, MD.  [facility] Office Number: [phone]  Patricia Pesa, M.D.  Merton Border, M.D  02/18/2017

## 2017-02-18 NOTE — Clinical Social Work Note (Signed)
Patient to be d/c'ed today to [facility] room 303B.  Patient and family agreeable to plans will transport via ems RN to call report [phone].  CSW updated patient's daughter Linwood Dibbles [phone].   Evette Cristal, MSW, [place]

## 2017-02-18 NOTE — Telephone Encounter (Signed)
-----   Message from Laverle Hobby, MD sent at 02/18/2017  1:52 PM EST ----- Regarding: hfu Pt needs hfu for COPD and possible OSA in 2-4 weeks.

## 2017-02-18 NOTE — Discharge Instructions (Signed)
Fall precaution. Heart healthy and ADA diet. 2 Liters Oxygen

## 2017-02-18 NOTE — Progress Notes (Signed)
MD order received to discharge pt to SNF/[facility] today; Care Management previously prepared discharge packet for EMS personnel to take to the facility; attempted to call report to [facility] [phone] spoke with Faith who took my name and number to have the RN call back in order to give report

## 2017-02-19 LAB — CULTURE, BLOOD (ROUTINE X 2)
CULTURE: NO GROWTH
Special Requests: ADEQUATE

## 2017-02-21 ENCOUNTER — Other Ambulatory Visit: Payer: Self-pay

## 2017-02-21 MED ORDER — LORAZEPAM 0.5 MG PO TABS: 0.5000 mg | ORAL_TABLET | Freq: Every day | ORAL | 3 refills | Status: DC

## 2017-02-21 NOTE — Telephone Encounter (Signed)
Rx sent to [facility] phone : [phone] , fax : [phone]  

## 2017-02-22 ENCOUNTER — Other Ambulatory Visit
Admission: RE | Admit: 2017-02-22 | Discharge: 2017-02-22 | Disposition: A | Discharge status: Home / Self Care | Payer: Medicare Other | Source: Ambulatory Visit | Attending: Gerontology | Admitting: Gerontology

## 2017-02-22 ENCOUNTER — Other Ambulatory Visit: Payer: Self-pay

## 2017-02-22 DIAGNOSIS — J441 Chronic obstructive pulmonary disease with (acute) exacerbation: Secondary | ICD-10-CM | POA: Insufficient documentation

## 2017-02-22 LAB — CBC WITH DIFFERENTIAL/PLATELET
BASOS ABS: 0 10*3/uL (ref 0–0.1)
Basophils Relative: 0 %
Eosinophils Absolute: 0.1 10*3/uL (ref 0–0.7)
Eosinophils Relative: 1 %
HEMATOCRIT: 31.8 % — AB (ref 35.0–47.0)
Hemoglobin: 10.4 g/dL — ABNORMAL LOW (ref 12.0–16.0)
LYMPHS PCT: 12 %
Lymphs Abs: 1.7 10*3/uL (ref 1.0–3.6)
MCH: 28.7 pg (ref 26.0–34.0)
MCHC: 32.8 g/dL (ref 32.0–36.0)
MCV: 87.6 fL (ref 80.0–100.0)
MONO ABS: 1 10*3/uL — AB (ref 0.2–0.9)
MONOS PCT: 7 %
NEUTROS ABS: 11.9 10*3/uL — AB (ref 1.4–6.5)
Neutrophils Relative %: 80 %
Platelets: 380 10*3/uL (ref 150–440)
RBC: 3.63 MIL/uL — ABNORMAL LOW (ref 3.80–5.20)
RDW: 15.3 % — AB (ref 11.5–14.5)
WBC: 14.8 10*3/uL — ABNORMAL HIGH (ref 3.6–11.0)

## 2017-02-22 LAB — COMPREHENSIVE METABOLIC PANEL
ALT: 16 U/L (ref 14–54)
AST: 22 U/L (ref 15–41)
Albumin: 2.7 g/dL — ABNORMAL LOW (ref 3.5–5.0)
Alkaline Phosphatase: 38 U/L (ref 38–126)
Anion gap: 12 (ref 5–15)
BILIRUBIN TOTAL: 0.7 mg/dL (ref 0.3–1.2)
BUN: 12 mg/dL (ref 6–20)
CALCIUM: 8.7 mg/dL — AB (ref 8.9–10.3)
CO2: 34 mmol/L — ABNORMAL HIGH (ref 22–32)
Chloride: 85 mmol/L — ABNORMAL LOW (ref 101–111)
Creatinine, Ser: 0.45 mg/dL (ref 0.44–1.00)
GFR calc Af Amer: >60 mL/min (ref >60)
Glucose, Bld: 94 mg/dL (ref 65–99)
POTASSIUM: 4.8 mmol/L (ref 3.5–5.1)
Sodium: 131 mmol/L — ABNORMAL LOW (ref 135–145)
TOTAL PROTEIN: 5.2 g/dL — AB (ref 6.5–8.1)

## 2017-02-22 MED ORDER — LORAZEPAM 0.5 MG PO TABS: 0.5000 mg | ORAL_TABLET | ORAL | 2 refills | Status: DC | PRN

## 2017-02-22 MED ORDER — LORAZEPAM 0.5 MG PO TABS: 0.5000 mg | ORAL_TABLET | Freq: Two times a day (BID) | ORAL | 2 refills | Status: DC

## 2017-02-22 NOTE — Telephone Encounter (Signed)
Rx sent to [facility] phone : [phone] , fax : [phone]  

## 2017-02-23 ENCOUNTER — Inpatient Hospital Stay
Admission: EM | Admit: 2017-02-23 | Discharge: 2017-03-04 | DRG: 871 | Disposition: A | Discharge status: Skilled Nursing Facility | Payer: Medicare Other | Attending: Internal Medicine | Admitting: Internal Medicine

## 2017-02-23 ENCOUNTER — Emergency Department: Payer: Medicare Other

## 2017-02-23 ENCOUNTER — Encounter: Payer: Self-pay | Admitting: Emergency Medicine

## 2017-02-23 DIAGNOSIS — G4733 Obstructive sleep apnea (adult) (pediatric): Secondary | ICD-10-CM | POA: Diagnosis present

## 2017-02-23 DIAGNOSIS — Z853 Personal history of malignant neoplasm of breast: Secondary | ICD-10-CM | POA: Diagnosis not present

## 2017-02-23 DIAGNOSIS — G9341 Metabolic encephalopathy: Secondary | ICD-10-CM | POA: Diagnosis present

## 2017-02-23 DIAGNOSIS — E669 Obesity, unspecified: Secondary | ICD-10-CM | POA: Diagnosis present

## 2017-02-23 DIAGNOSIS — Z7984 Long term (current) use of oral hypoglycemic drugs: Secondary | ICD-10-CM

## 2017-02-23 DIAGNOSIS — A419 Sepsis, unspecified organism: Principal | ICD-10-CM | POA: Diagnosis present

## 2017-02-23 DIAGNOSIS — K7460 Unspecified cirrhosis of liver: Secondary | ICD-10-CM

## 2017-02-23 DIAGNOSIS — Z803 Family history of malignant neoplasm of breast: Secondary | ICD-10-CM

## 2017-02-23 DIAGNOSIS — I110 Hypertensive heart disease with heart failure: Secondary | ICD-10-CM | POA: Diagnosis present

## 2017-02-23 DIAGNOSIS — E1140 Type 2 diabetes mellitus with diabetic neuropathy, unspecified: Secondary | ICD-10-CM | POA: Diagnosis present

## 2017-02-23 DIAGNOSIS — K219 Gastro-esophageal reflux disease without esophagitis: Secondary | ICD-10-CM | POA: Diagnosis present

## 2017-02-23 DIAGNOSIS — Z96651 Presence of right artificial knee joint: Secondary | ICD-10-CM | POA: Diagnosis present

## 2017-02-23 DIAGNOSIS — E222 Syndrome of inappropriate secretion of antidiuretic hormone: Secondary | ICD-10-CM | POA: Diagnosis present

## 2017-02-23 DIAGNOSIS — I5042 Chronic combined systolic (congestive) and diastolic (congestive) heart failure: Secondary | ICD-10-CM | POA: Diagnosis present

## 2017-02-23 DIAGNOSIS — Y95 Nosocomial condition: Secondary | ICD-10-CM | POA: Diagnosis present

## 2017-02-23 DIAGNOSIS — R059 Cough, unspecified: Secondary | ICD-10-CM

## 2017-02-23 DIAGNOSIS — Z85038 Personal history of other malignant neoplasm of large intestine: Secondary | ICD-10-CM

## 2017-02-23 DIAGNOSIS — E785 Hyperlipidemia, unspecified: Secondary | ICD-10-CM | POA: Diagnosis present

## 2017-02-23 DIAGNOSIS — Z7401 Bed confinement status: Secondary | ICD-10-CM

## 2017-02-23 DIAGNOSIS — Z7951 Long term (current) use of inhaled steroids: Secondary | ICD-10-CM | POA: Diagnosis not present

## 2017-02-23 DIAGNOSIS — J441 Chronic obstructive pulmonary disease with (acute) exacerbation: Secondary | ICD-10-CM | POA: Diagnosis present

## 2017-02-23 DIAGNOSIS — E873 Alkalosis: Secondary | ICD-10-CM | POA: Diagnosis present

## 2017-02-23 DIAGNOSIS — Z9049 Acquired absence of other specified parts of digestive tract: Secondary | ICD-10-CM

## 2017-02-23 DIAGNOSIS — E861 Hypovolemia: Secondary | ICD-10-CM | POA: Diagnosis not present

## 2017-02-23 DIAGNOSIS — J449 Chronic obstructive pulmonary disease, unspecified: Secondary | ICD-10-CM | POA: Diagnosis present

## 2017-02-23 DIAGNOSIS — Z515 Encounter for palliative care: Secondary | ICD-10-CM | POA: Diagnosis not present

## 2017-02-23 DIAGNOSIS — Z6835 Body mass index (BMI) 35.0-35.9, adult: Secondary | ICD-10-CM

## 2017-02-23 DIAGNOSIS — E876 Hypokalemia: Secondary | ICD-10-CM | POA: Diagnosis not present

## 2017-02-23 DIAGNOSIS — R05 Cough: Secondary | ICD-10-CM

## 2017-02-23 DIAGNOSIS — Z7982 Long term (current) use of aspirin: Secondary | ICD-10-CM | POA: Diagnosis not present

## 2017-02-23 DIAGNOSIS — Z933 Colostomy status: Secondary | ICD-10-CM | POA: Diagnosis not present

## 2017-02-23 DIAGNOSIS — I5022 Chronic systolic (congestive) heart failure: Secondary | ICD-10-CM | POA: Diagnosis present

## 2017-02-23 DIAGNOSIS — R509 Fever, unspecified: Secondary | ICD-10-CM

## 2017-02-23 DIAGNOSIS — Z87891 Personal history of nicotine dependence: Secondary | ICD-10-CM | POA: Diagnosis not present

## 2017-02-23 DIAGNOSIS — Z9981 Dependence on supplemental oxygen: Secondary | ICD-10-CM

## 2017-02-23 DIAGNOSIS — J189 Pneumonia, unspecified organism: Secondary | ICD-10-CM | POA: Diagnosis not present

## 2017-02-23 DIAGNOSIS — F419 Anxiety disorder, unspecified: Secondary | ICD-10-CM | POA: Diagnosis present

## 2017-02-23 DIAGNOSIS — Z800 Family history of malignant neoplasm of digestive organs: Secondary | ICD-10-CM | POA: Diagnosis not present

## 2017-02-23 DIAGNOSIS — J440 Chronic obstructive pulmonary disease with acute lower respiratory infection: Secondary | ICD-10-CM | POA: Diagnosis present

## 2017-02-23 DIAGNOSIS — Z79899 Other long term (current) drug therapy: Secondary | ICD-10-CM | POA: Diagnosis not present

## 2017-02-23 DIAGNOSIS — I10 Essential (primary) hypertension: Secondary | ICD-10-CM | POA: Diagnosis present

## 2017-02-23 DIAGNOSIS — Z7189 Other specified counseling: Secondary | ICD-10-CM | POA: Diagnosis not present

## 2017-02-23 LAB — BRAIN NATRIURETIC PEPTIDE: B NATRIURETIC PEPTIDE 5: 289 pg/mL — AB (ref 0.0–100.0)

## 2017-02-23 LAB — CBC
HCT: 34.7 % — ABNORMAL LOW (ref 35.0–47.0)
Hemoglobin: 11.2 g/dL — ABNORMAL LOW (ref 12.0–16.0)
MCH: 28.2 pg (ref 26.0–34.0)
MCHC: 32.2 g/dL (ref 32.0–36.0)
MCV: 87.6 fL (ref 80.0–100.0)
PLATELETS: 354 10*3/uL (ref 150–440)
RBC: 3.97 MIL/uL (ref 3.80–5.20)
RDW: 15.1 % — AB (ref 11.5–14.5)
WBC: 14.4 10*3/uL — AB (ref 3.6–11.0)

## 2017-02-23 LAB — BASIC METABOLIC PANEL
Anion gap: 8 (ref 5–15)
BUN: 12 mg/dL (ref 6–20)
CO2: 39 mmol/L — ABNORMAL HIGH (ref 22–32)
CREATININE: 0.48 mg/dL (ref 0.44–1.00)
Calcium: 8.7 mg/dL — ABNORMAL LOW (ref 8.9–10.3)
Chloride: 82 mmol/L — ABNORMAL LOW (ref 101–111)
GFR calc Af Amer: >60 mL/min (ref >60)
GFR calc non Af Amer: >60 mL/min (ref >60)
Glucose, Bld: 246 mg/dL — ABNORMAL HIGH (ref 65–99)
Potassium: 4.5 mmol/L (ref 3.5–5.1)
SODIUM: 129 mmol/L — AB (ref 135–145)

## 2017-02-23 LAB — URINALYSIS, COMPLETE (UACMP) WITH MICROSCOPIC
BACTERIA UA: NONE SEEN
Bilirubin Urine: NEGATIVE
Glucose, UA: NEGATIVE mg/dL
Hgb urine dipstick: NEGATIVE
KETONES UR: NEGATIVE mg/dL
LEUKOCYTES UA: NEGATIVE
Nitrite: NEGATIVE
PH: 7 (ref 5.0–8.0)
Protein, ur: NEGATIVE mg/dL
Specific Gravity, Urine: 1.011 (ref 1.005–1.030)

## 2017-02-23 LAB — INFLUENZA PANEL BY PCR (TYPE A & B)
INFLAPCR: NEGATIVE
Influenza B By PCR: NEGATIVE

## 2017-02-23 LAB — TROPONIN I: Troponin I: <0.03 ng/mL (ref <0.03)

## 2017-02-23 MED ORDER — VANCOMYCIN HCL IN DEXTROSE 1-5 GM/200ML-% IV SOLN
1000.0000 mg | Freq: Once | INTRAVENOUS | Status: AC
  Administered 2017-02-23: 1000 mg via INTRAVENOUS

## 2017-02-23 MED ORDER — VANCOMYCIN HCL IN DEXTROSE 1-5 GM/200ML-% IV SOLN
1000.0000 mg | Freq: Two times a day (BID) | INTRAVENOUS | Status: DC
  Administered 2017-02-24 – 2017-02-25 (×4): 1000 mg via INTRAVENOUS
  Filled 2017-02-23 (×5): qty 200

## 2017-02-23 MED ORDER — SODIUM CHLORIDE 0.9 % IV SOLN
2.0000 g | Freq: Three times a day (TID) | INTRAVENOUS | Status: DC
  Administered 2017-02-24 – 2017-02-28 (×13): 2 g via INTRAVENOUS
  Filled 2017-02-23 (×15): qty 2

## 2017-02-23 MED ORDER — ACETAMINOPHEN 650 MG RE SUPP
650.0000 mg | Freq: Once | RECTAL | Status: AC
  Administered 2017-02-23: 650 mg via RECTAL
  Filled 2017-02-23: qty 1

## 2017-02-23 MED ORDER — SODIUM CHLORIDE 0.9 % IV SOLN
1.0000 g | Freq: Once | INTRAVENOUS | Status: AC
  Administered 2017-02-23: 1 g via INTRAVENOUS
  Filled 2017-02-23: qty 1

## 2017-02-23 NOTE — ED Notes (Signed)
Pt's depends changed. 

## 2017-02-23 NOTE — H&P (Signed)
[facility] NAME: Kathryn Hayes    MR#:  [id number]  DATE OF BIRTH:  04/20/1934  DATE OF ADMISSION:  02/23/2017  PRIMARY CARE PHYSICIAN: Juluis Pitch, MD   REQUESTING/REFERRING PHYSICIAN: Burlene Arnt, MD  CHIEF COMPLAINT:   Chief Complaint  Patient presents with  . Shortness of Breath    HISTORY OF PRESENT ILLNESS:  Kathryn Hayes  is a 83 y.o. female who presents after recent discharge from the hospital with persistent symptoms including shortness of breath, intermittent confusion, malaise and general weakness.  Patient meets sepsis criteria here with fever and tachycardia as well as elevated white blood cell count.  Of note, she has been on p.o. steroids since discharge which could have contributed to her white count.  However, she has clinical symptoms of persistent or recurrent pneumonia, including discolored sputum production and shortness of breath.  Chest x-ray did not show definitive pneumonia, but given her clinical picture hospitalist were called for admission  PAST MEDICAL HISTORY:   Past Medical History:  Diagnosis Date  . Anxiety    unspecified  . Arthritis   . Breast cancer ([place]) 2007   Early stage left breast cancer status post partial colectomy, 04/2005 (Dr. Oliva Bustard)  . Chest pain, unspecified   . CHF (congestive heart failure) ([place])   . Chronic headache   . Colon cancer ([place])    status post partial colectomy, diagnosed 2000  . Colon neoplasm   . COPD (chronic obstructive pulmonary disease) ([place])   . Depression    unspecified  . Diabetes mellitus   . Fibrocystic breast   . History of breast cancer   . History of colon cancer   . HTN (hypertension) with goal to be determined   . Hyperlipidemia   . Hyperlipidemia, unspecified   . Hypertension   . Obesity, unspecified   . Sleep apnea    mild to moderate    PAST SURGICAL HISTORY:   Past Surgical History:  Procedure Laterality Date  .  APPENDECTOMY    . BREAST LUMPECTOMY  2007   left  . BUNIONECTOMY Right   . CATARACT EXTRACTION W/ INTRAOCULAR LENS IMPLANT & ANTERIOR VITRECTOMY, BILATERAL Bilateral   . COLON SURGERY  1993  . COLOSTOMY    . DILATION AND CURETTAGE, DIAGNOSTIC / THERAPEUTIC     Fibroid removal  . INSERT REPLACE REMOVE PACEMAKER     ppm medtronic [id number] Adapta dula chamber rate responsive. 02/21/14  . MASTECTOMY PARTIAL / LUMPECTOMY    . PARTIAL COLECTOMY     Abdominal & Transanal   . TONSILLECTOMY    . TOTAL KNEE ARTHROPLASTY Right 07/03/2013   Right total knee arthroplasty using computer assisted navigation  . TUBAL LIGATION Bilateral 1971   with questionable appendectomy    SOCIAL HISTORY:   Social History   Tobacco Use  . Smoking status: Former Smoker    Packs/day: 1.50    Years: 50.00    Pack years: 75.00    Types: Cigarettes    Last attempt to quit: 10/05/2010    Years since quitting: 6.3  . Smokeless tobacco: Never Used  Substance Use Topics  . Alcohol use: No    Alcohol/week: 1.0 oz    Types: 2 Standard drinks or equivalent per week    Comment: Occasionally    FAMILY HISTORY:   Family History  Problem Relation Age of Onset  . Factor V Leiden deficiency Sister   . Colon polyps Sister   .  Breast cancer Mother   . Colon cancer Mother   . Alzheimer's disease Mother   . Prostate cancer Father     DRUG ALLERGIES:  No Known Allergies  MEDICATIONS AT HOME:   Prior to Admission medications   Medication Sig Start Date End Date Taking? Authorizing Provider  acetaminophen (TYLENOL) 325 MG tablet Take 650 mg by mouth every 4 (four) hours as needed for mild pain, moderate pain or fever. Do not exceed 3000 mg in 24hrs     [provider]  albuterol (PROVENTIL) (2.5 MG/3ML) 0.083% nebulizer solution Take 2.5 mg by nebulization every 6 (six) hours as needed for wheezing or shortness of breath.     [provider]  aspirin EC 81 MG tablet Take 81 mg by mouth daily.     [provider]  bisacodyl (DULCOLAX) 5 MG EC tablet Take 2 tablets (10 mg total) by mouth daily as needed for moderate constipation. 02/18/17   Demetrios Loll, MD  Cholecalciferol 2000 units CAPS Take 2,000 Units by mouth daily.    [provider]  divalproex (DEPAKOTE SPRINKLE) 125 MG capsule Take 250 mg by mouth 2 (two) times daily.     [provider]  docusate sodium (COLACE) 100 MG capsule Take 100 mg by mouth daily.     [provider]  DULoxetine (CYMBALTA) 60 MG capsule Take 60 mg by mouth daily.     [provider]  fluticasone-salmeterol (ADVAIR HFA) 115-21 MCG/ACT inhaler Inhale 2 puffs into the lungs 2 (two) times daily. 8 am and 8 pm    [provider]  furosemide (LASIX) 20 MG tablet Take 20 mg by mouth daily.     [provider]  gabapentin (NEURONTIN) 400 MG capsule Take 400 mg by mouth 3 (three) times daily.     [provider]  glipiZIDE-metformin (METAGLIP) 2.5-500 MG tablet Take 1 tablet by mouth daily.     [provider]  guaiFENesin-dextromethorphan (ROBITUSSIN DM) 100-10 MG/5ML syrup Take 5 mLs by mouth every 4 (four) hours as needed for cough. 02/18/17   Demetrios Loll, MD  Ipratropium-Albuterol (COMBIVENT RESPIMAT) 20-100 MCG/ACT AERS respimat Inhale 1 puff into the lungs 4 (four) times daily.     [provider]  ketotifen (ZADITOR) 0.025 % ophthalmic solution Place 1 drop into both eyes 2 (two) times daily.     [provider]  LORazepam (ATIVAN) 0.5 MG tablet Take 1 tablet (0.5 mg total) by mouth 2 (two) times daily. 02/22/17   Toni Arthurs, NP  LORazepam (ATIVAN) 0.5 MG tablet Take 1 tablet (0.5 mg total) by mouth every 4 (four) hours as needed. 02/22/17   Toni Arthurs, NP  magnesium oxide (MAG-OX) 400 MG tablet Take 400 mg by mouth 2 (two) times daily.     [provider]  Melatonin 10 MG TABS Take 10 mg by mouth at bedtime.     [provider]  Menthol 1 MG  LOZG Use as directed 1 lozenge in the mouth or throat every 6 (six) hours as needed. For irritation/pain    [provider]  Multiple Vitamins-Minerals (SENIOR TABS) TABS Take 1 tablet by mouth daily.    [provider]  nystatin (MYCOSTATIN/NYSTOP) powder Apply 1 application topically to abdominal folds daily for redness and irritation.    [provider]  pantoprazole (PROTONIX) 20 MG tablet Take 20 mg by mouth daily.    [provider]  potassium chloride (K-DUR,KLOR-CON) 10 MEQ tablet Take 10 mEq  by mouth every other day.     [provider]  pravastatin (PRAVACHOL) 10 MG tablet Take 10 mg by mouth daily at 6 PM. On Sunday    [provider]  predniSONE (DELTASONE) 10 MG tablet 30 mg po daily for 1 day, 20 mg po daily for 1 day, 10 mg po daily for 1 day. 02/18/17   Demetrios Loll, MD  senna-docusate (SENOKOT-S) 8.6-50 MG tablet Take 1 tablet by mouth 2 (two) times daily.     [provider]  Skin Protectants, Misc. (EUCERIN) cream Apply liberal amount topically to dry areas of skin daily after bath and as needed. Ok to keep at bedside    [provider]  sucralfate (CARAFATE) 1 G tablet Take 1 g by mouth daily.     [provider]  trandolapril (MAVIK) 2 MG tablet Take 2 mg by mouth daily.    [provider]  traZODone (DESYREL) 100 MG tablet Take 1 tablet (100 mg total) by mouth at bedtime. 08/25/15   Hillary Bow, MD    REVIEW OF SYSTEMS:  Review of Systems  Constitutional: Positive for fever and malaise/fatigue. Negative for chills and weight loss.  HENT: Negative for ear pain, hearing loss and tinnitus.   Eyes: Negative for blurred vision, double vision, pain and redness.  Respiratory: Positive for cough, sputum production and shortness of breath. Negative for hemoptysis.   Cardiovascular: Negative for chest pain, palpitations, orthopnea and leg swelling.  Gastrointestinal: Negative for abdominal pain,  constipation, diarrhea, nausea and vomiting.  Genitourinary: Negative for dysuria, frequency and hematuria.  Musculoskeletal: Negative for back pain, joint pain and neck pain.  Skin:       No acne, rash, or lesions  Neurological: Positive for weakness. Negative for dizziness, tremors and focal weakness.  Endo/Heme/Allergies: Negative for polydipsia. Does not bruise/bleed easily.  Psychiatric/Behavioral: Negative for depression. The patient is not nervous/anxious and does not have insomnia.      VITAL SIGNS:   Vitals:   02/23/17 2130 02/23/17 2141 02/23/17 2200 02/23/17 2230  BP: 107/74  127/67 104/69  Pulse: (!) 107  (!) 107 (!) 105  Resp: (!) 23  17 18   Temp:  100 F (37.8 C)    TempSrc:  Oral    SpO2: 98%  94% (!) 88%  Weight:       Wt Readings from Last 3 Encounters:  02/23/17 97.5 kg (215 lb)  02/14/17 97.5 kg (215 lb)  02/14/17 96.8 kg (213 lb 8 oz)    PHYSICAL EXAMINATION:  Physical Exam  Vitals reviewed. Constitutional: She is oriented to person, place, and time. She appears well-developed and well-nourished. No distress.  HENT:  Head: Normocephalic and atraumatic.  Mouth/Throat: Oropharynx is clear and moist.  Eyes: Conjunctivae and EOM are normal. Pupils are equal, round, and reactive to light. No scleral icterus.  Neck: Normal range of motion. Neck supple. No JVD present. No thyromegaly present.  Cardiovascular: Regular rhythm and intact distal pulses. Exam reveals no gallop and no friction rub.  No murmur heard. Tachycardic  Respiratory: Effort normal. No respiratory distress. She has no wheezes. She has no rales.  Diffuse bilateral coarse breath sounds  GI: Soft. Bowel sounds are normal. She exhibits no distension. There is no tenderness.  Musculoskeletal: Normal range of motion. She exhibits no edema.  No arthritis, no gout  Lymphadenopathy:    She has no cervical adenopathy.  Neurological: She is alert and oriented to person, place, and time. No cranial  nerve deficit.  No dysarthria, no aphasia  Skin: Skin is warm and dry. No rash noted. No erythema.  Psychiatric: She has a normal mood and affect. Her behavior is normal. Judgment and thought content normal.    LABORATORY PANEL:   CBC Recent Labs  Lab 02/23/17 1938  WBC 14.4*  HGB 11.2*  HCT 34.7*  PLT 354   ------------------------------------------------------------------------------------------------------------------  Chemistries  Recent Labs  Lab 02/22/17 0455 02/23/17 1938  NA 131* 129*  K 4.8 4.5  CL 85* 82*  CO2 34* 39*  GLUCOSE 94 246*  BUN 12 12  CREATININE 0.45 0.48  CALCIUM 8.7* 8.7*  AST 22  --   ALT 16  --   ALKPHOS 38  --   BILITOT 0.7  --    ------------------------------------------------------------------------------------------------------------------  Cardiac Enzymes Recent Labs  Lab 02/23/17 1938  TROPONINI <0.03   ------------------------------------------------------------------------------------------------------------------  RADIOLOGY:  Dg Chest Port 1 View  Result Date: 02/23/2017 CLINICAL DATA:  Increased dyspnea x1 week EXAM: PORTABLE CHEST 1 VIEW COMPARISON:  02/15/2017 FINDINGS: Stable cardiomegaly. Left-sided pacemaker apparatus with right atrial and right ventricular leads are redemonstrated. There is moderate aortic atherosclerosis with uncoiling of the thoracic aorta as before. Chronic left basilar atelectasis. No acute pneumonic consolidation or overt edema. Surgical clips project of left axilla. Osteoarthritis of the [facility] and glenohumeral joints noted bilaterally. IMPRESSION: Stable cardiomegaly with aortic atherosclerosis. No active pulmonary disease. Left basilar atelectasis. Electronically Signed   By: Ashley Royalty M.D.   On: 02/23/2017 20:17    EKG:   Orders placed or performed during the hospital encounter of 02/23/17  . ED EKG within 10 minutes  . ED EKG within 10 minutes  . EKG 12-Lead  . EKG 12-Lead    IMPRESSION  AND PLAN:  Principal Problem:   Sepsis ([place]) -IV antibiotics, lactic acid was within normal limits, blood pressure stable, cultures sent Active Problems:   HCAP (healthcare-associated pneumonia) -antibiotics as above, other supportive treatment PRN   COPD (chronic obstructive pulmonary disease) ([facility]) -p.o. steroid taper, continue home dose inhalers   Type 2 diabetes, controlled, with neuropathy ([facility]) -sliding scale insulin with corresponding glucose checks   Chronic systolic CHF (congestive heart failure) ([place]) -continue home meds   HTN (hypertension) -home dose antihypertensives  All the records are reviewed and case discussed with ED provider. Management plans discussed with the patient and/or family.  DVT PROPHYLAXIS: SubQ lovenox  GI PROPHYLAXIS: None  ADMISSION STATUS: Inpatient  CODE STATUS: Full Code Status History    Date Active Date Inactive Code Status Order ID Comments User Context   02/14/2017 22:27 02/18/2017 19:51 Full Code [id number]  Demetrios Loll, MD Inpatient   09/21/2015 19:12 09/25/2015 21:00 Full Code [id number]  Vaughan Basta, MD Inpatient   08/15/2015 15:55 08/16/2015 09:10 Full Code [id number]  Hugelmeyer, Ubaldo Glassing, DO Inpatient    Prior TOTAL TIME TAKING CARE OF THIS PATIENT: 45 minutes.   Sherie Dobrowolski [place] 02/23/2017, 11:02 PM  [employer]  [phone]  CC: Primary care physician; Juluis Pitch, MD  Note:  This document was prepared using Dragon voice recognition software and may include unintentional dictation errors.

## 2017-02-23 NOTE — Progress Notes (Signed)
Pharmacy Antibiotic Note  Kathryn Hayes is a 83 y.o. female admitted on 02/23/2017 with pneumonia and sepsis.  Pharmacy has been consulted for vancomycin and cefepime dosing.  Plan: DW 72 kg  Vd 50 L kei 0.058 hr-1  t1/2 12 hours Vancomycin 1 gram q 12 hours ordered with stacked dosing. Level before 5th dose. Goal trough 15-20  Cefepime 2 grams q 8 hours ordered  Weight: 215 lb (97.5 kg)  Temp (24hrs), Avg:100.6 F (38.1 C), Min:100 F (37.8 C), Max:101.2 F (38.4 C)  Recent Labs  Lab 02/17/17 0553 02/18/17 0411 02/22/17 0455 02/23/17 1938  WBC  --   --  14.8* 14.4*  CREATININE 0.52 0.44 0.45 0.48    Estimated Creatinine Clearance: 63.6 mL/min (by C-G formula based on SCr of 0.48 mg/dL).    No Known Allergies  Antimicrobials this admission: Vancomycin, cefepime 2/20  >>    >>   Dose adjustments this admission:   Microbiology results: 2/20 BCx: pending   Thank you for allowing pharmacy to be a part of this patient's care.  Brynnlie Unterreiner S 02/23/2017 11:10 PM

## 2017-02-23 NOTE — ED Provider Notes (Signed)
[facility] Emergency Department Provider Note  ____________________________________________   I have reviewed the triage vital signs and the nursing notes. Where available I have reviewed prior notes and, if possible and indicated, outside hospital notes.    HISTORY  Chief Complaint Shortness of Breath    HPI Kathryn Hayes is a 83 y.o. female history of COPD CHF, depression, on home oxygen, 2-3 L, presents today with cough, and fever and confusion.  Patient recently was discharged from the hospital.  She states her cough is productive.  She denies any chest pain, nausea or vomiting, denies any diarrhea.  Has not had to significantly increase her oxygen.  Family are worried that perhaps she has worsening CHF.  Patient is not ambulatory at baseline.  She does not have any leg swelling.   Past Medical History:  Diagnosis Date  . Anxiety    unspecified  . Arthritis   . Breast cancer ([place]) 2007   Early stage left breast cancer status post partial colectomy, 04/2005 (Dr. Oliva Bustard)  . Chest pain, unspecified   . CHF (congestive heart failure) ([place])   . Chronic headache   . Colon cancer ([place])    status post partial colectomy, diagnosed 2000  . Colon neoplasm   . COPD (chronic obstructive pulmonary disease) ([place])   . Depression    unspecified  . Diabetes mellitus   . Fibrocystic breast   . History of breast cancer   . History of colon cancer   . HTN (hypertension) with goal to be determined   . Hyperlipidemia   . Hyperlipidemia, unspecified   . Hypertension   . Obesity, unspecified   . Sleep apnea    mild to moderate    Patient Active Problem List   Diagnosis Date Noted  . Acute respiratory failure with hypoxia ([place]) 02/14/2017  . Dependence on supplemental oxygen 03/25/2016  . Primary central sleep apnea 03/25/2016  . Dependence on other enabling machines and devices 03/25/2016  . Presence of cardiac pacemaker 03/25/2016  . Bradycardia, sinus  03/25/2016  . Second degree AV block 03/25/2016  . Major depression, single episode 03/25/2016  . Anxiety disorder 03/25/2016  . Vitamin D deficiency 03/25/2016  . Insomnia 03/25/2016  . GERD (gastroesophageal reflux disease) 03/25/2016  . Syndrome of inappropriate antidiuretic hormone ([place]) 03/25/2016  . Migraine without status migrainosus, not intractable 03/25/2016  . Personal history of other malignant neoplasm of large intestine 03/25/2016  . Personal history of malignant neoplasm of breast 03/25/2016  . HTN (hypertension) 12/16/2015  . CHF (congestive heart failure) ([place]) 08/16/2015  . 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 Barrett's esophagus 01/05/2012  . Hypoxemic respiratory failure, chronic ([place]) 03/10/2011  . COPD (chronic obstructive pulmonary disease) ([place])   . Hypertensive heart disease with CHF (congestive heart failure) ([place])   . Hyperlipidemia, unspecified   . Type 2 diabetes, controlled, with neuropathy [facility])     Past Surgical History:  Procedure Laterality Date  . APPENDECTOMY    . BREAST LUMPECTOMY  2007   left  . BUNIONECTOMY Right   . CATARACT EXTRACTION W/ INTRAOCULAR LENS IMPLANT & ANTERIOR VITRECTOMY, BILATERAL Bilateral   . COLON SURGERY  1993  . COLOSTOMY    . DILATION AND CURETTAGE, DIAGNOSTIC / THERAPEUTIC     Fibroid removal  . INSERT REPLACE REMOVE PACEMAKER     ppm medtronic [id number] Adapta dula chamber rate responsive. 02/21/14  . MASTECTOMY PARTIAL / LUMPECTOMY    . PARTIAL COLECTOMY     Abdominal &  Transanal   . TONSILLECTOMY    . TOTAL KNEE ARTHROPLASTY Right 07/03/2013   Right total knee arthroplasty using computer assisted navigation  . TUBAL LIGATION Bilateral 1971   with questionable appendectomy    Prior to Admission medications   Medication Sig Start Date End Date Taking? Authorizing Provider  acetaminophen (TYLENOL) 325 MG tablet Take 650 mg by mouth every 4 (four) hours as needed for mild pain, moderate pain or fever. Do not exceed  3000 mg in 24hrs     [provider]  albuterol (PROVENTIL) (2.5 MG/3ML) 0.083% nebulizer solution Take 2.5 mg by nebulization every 6 (six) hours as needed for wheezing or shortness of breath.     [provider]  aspirin EC 81 MG tablet Take 81 mg by mouth daily.    [provider]  bisacodyl (DULCOLAX) 5 MG EC tablet Take 2 tablets (10 mg total) by mouth daily as needed for moderate constipation. 02/18/17   Demetrios Loll, MD  Cholecalciferol 2000 units CAPS Take 2,000 Units by mouth daily.    [provider]  divalproex (DEPAKOTE SPRINKLE) 125 MG capsule Take 250 mg by mouth 2 (two) times daily.     [provider]  docusate sodium (COLACE) 100 MG capsule Take 100 mg by mouth daily.     [provider]  DULoxetine (CYMBALTA) 60 MG capsule Take 60 mg by mouth daily.     [provider]  fluticasone-salmeterol (ADVAIR HFA) 115-21 MCG/ACT inhaler Inhale 2 puffs into the lungs 2 (two) times daily. 8 am and 8 pm    [provider]  furosemide (LASIX) 20 MG tablet Take 20 mg by mouth daily.     [provider]  gabapentin (NEURONTIN) 400 MG capsule Take 400 mg by mouth 3 (three) times daily.     [provider]  glipiZIDE-metformin (METAGLIP) 2.5-500 MG tablet Take 1 tablet by mouth daily.     [provider]  guaiFENesin-dextromethorphan (ROBITUSSIN DM) 100-10 MG/5ML syrup Take 5 mLs by mouth every 4 (four) hours as needed for cough. 02/18/17   Demetrios Loll, MD  Ipratropium-Albuterol (COMBIVENT RESPIMAT) 20-100 MCG/ACT AERS respimat Inhale 1 puff into the lungs 4 (four) times daily.     [provider]  ketotifen (ZADITOR) 0.025 % ophthalmic solution Place 1 drop into both eyes 2 (two) times daily.     [provider]  LORazepam (ATIVAN) 0.5 MG tablet Take 1 tablet (0.5 mg total) by mouth 2 (two) times daily. 02/22/17   Toni Arthurs, NP  LORazepam (ATIVAN) 0.5 MG tablet Take 1 tablet (0.5 mg  total) by mouth every 4 (four) hours as needed. 02/22/17   Toni Arthurs, NP  magnesium oxide (MAG-OX) 400 MG tablet Take 400 mg by mouth 2 (two) times daily.     [provider]  Melatonin 10 MG TABS Take 10 mg by mouth at bedtime.     [provider]  Menthol 1 MG LOZG Use as directed 1 lozenge in the mouth or throat every 6 (six) hours as needed. For irritation/pain    [provider]  Multiple Vitamins-Minerals (SENIOR TABS) TABS Take 1 tablet by mouth daily.    [provider]  nystatin (MYCOSTATIN/NYSTOP) powder Apply 1 application topically to abdominal folds daily for redness and irritation.    [provider]  pantoprazole (PROTONIX) 20 MG tablet Take 20 mg by mouth daily.    [provider]  potassium chloride (K-DUR,KLOR-CON) 10 MEQ tablet Take 10  mEq by mouth every other day.     [provider]  pravastatin (PRAVACHOL) 10 MG tablet Take 10 mg by mouth daily at 6 PM. On Sunday    [provider]  predniSONE (DELTASONE) 10 MG tablet 30 mg po daily for 1 day, 20 mg po daily for 1 day, 10 mg po daily for 1 day. 02/18/17   Demetrios Loll, MD  senna-docusate (SENOKOT-S) 8.6-50 MG tablet Take 1 tablet by mouth 2 (two) times daily.     [provider]  Skin Protectants, Misc. (EUCERIN) cream Apply liberal amount topically to dry areas of skin daily after bath and as needed. Ok to keep at bedside    [provider]  sucralfate (CARAFATE) 1 G tablet Take 1 g by mouth daily.     [provider]  trandolapril (MAVIK) 2 MG tablet Take 2 mg by mouth daily.    [provider]  traZODone (DESYREL) 100 MG tablet Take 1 tablet (100 mg total) by mouth at bedtime. 08/25/15   Hillary Bow, MD    Allergies Patient has no known allergies.  Family History  Problem Relation Age of Onset  . Factor V Leiden deficiency Sister   . Colon polyps Sister   . Breast cancer Mother   . Colon cancer Mother   .  Alzheimer's disease Mother   . Prostate cancer Father     Social History Social History   Tobacco Use  . Smoking status: Former Smoker    Packs/day: 1.50    Years: 50.00    Pack years: 75.00    Types: Cigarettes    Last attempt to quit: 10/05/2010    Years since quitting: 6.3  . Smokeless tobacco: Never Used  Substance Use Topics  . Alcohol use: No    Alcohol/week: 1.0 oz    Types: 2 Standard drinks or equivalent per week    Comment: Occasionally  . Drug use: Not on file    Review of Systems Constitutional: No fever/chills Eyes: No visual changes. ENT: No sore throat. No stiff neck no neck pain Cardiovascular: Denies chest pain. Respiratory: Denies shortness of breath. Gastrointestinal:   no vomiting.  No diarrhea.  No constipation. Genitourinary: Negative for dysuria. Musculoskeletal: Negative lower extremity swelling Skin: Negative for rash. Neurological: Negative for severe headaches, focal weakness or numbness.   ____________________________________________   PHYSICAL EXAM:  VITAL SIGNS: ED Triage Vitals  Enc Vitals Group     BP 02/23/17 2000 121/77     Pulse Rate 02/23/17 2000 (!) 111     Resp 02/23/17 2000 20     Temp 02/23/17 2000 (!) 101.2 F (38.4 C)     Temp Source 02/23/17 2000 Oral     SpO2 02/23/17 2000 99 %     Weight 02/23/17 1937 215 lb (97.5 kg)     Height --      Head Circumference --      Peak Flow --      Pain Score --      Pain Loc --      Pain Edu? --      Excl. in [employer]? --     Constitutional: Alert and oriented. Well appearing and in no acute distress. Eyes: Conjunctivae are normal Head: Atraumatic HEENT: No congestion/rhinnorhea. Mucous membranes are moist.  Oropharynx non-erythematous Neck:   Nontender with no meningismus, no masses, no stridor Cardiovascular: Normal rate, regular rhythm. Grossly normal heart sounds.  Good peripheral circulation. Respiratory: Occasional rhonchorous cough appreciated,  no rales or rhonchi  otherwise noted Abdominal: Soft and nontender. No distention. No guarding no rebound Back:  There is no focal tenderness or step off.  there is no midline tenderness there are no lesions noted. there is no CVA tenderness Musculoskeletal: No lower extremity tenderness, no upper extremity tenderness. No joint effusions, no DVT signs strong distal pulses no edema Neurologic:  Normal speech and language. No gross focal neurologic deficits are appreciated.  Skin:  Skin is warm, dry and intact. No rash noted. Psychiatric: Mood and affect are normal. Speech and behavior are normal.  ____________________________________________   LABS (all labs ordered are listed, but only abnormal results are displayed)  Labs Reviewed  BASIC METABOLIC PANEL - Abnormal; Notable for the following components:      Result Value   Sodium 129 (*)    Chloride 82 (*)    CO2 39 (*)    Glucose, Bld 246 (*)    Calcium 8.7 (*)    All other components within normal limits  CBC - Abnormal; Notable for the following components:   WBC 14.4 (*)    Hemoglobin 11.2 (*)    HCT 34.7 (*)    RDW 15.1 (*)    All other components within normal limits  BRAIN NATRIURETIC PEPTIDE - Abnormal; Notable for the following components:   B Natriuretic Peptide 289.0 (*)    All other components within normal limits  URINALYSIS, COMPLETE (UACMP) WITH MICROSCOPIC - Abnormal; Notable for the following components:   Color, Urine YELLOW (*)    APPearance CLEAR (*)    Squamous Epithelial / LPF 0-5 (*)    All other components within normal limits  TROPONIN I  INFLUENZA PANEL BY PCR (TYPE A & B)    Pertinent labs  results that were available during my care of the patient were reviewed by me and considered in my medical decision making (see chart for details). ____________________________________________  EKG  I personally interpreted any EKGs ordered by me or triage AV paced rhythm, rate  114 ____________________________________________  RADIOLOGY  Pertinent labs & imaging results that were available during my care of the patient were reviewed by me and considered in my medical decision making (see chart for details). If possible, patient and/or family made aware of any abnormal findings.  Dg Chest Port 1 View  Result Date: 02/23/2017 CLINICAL DATA:  Increased dyspnea x1 week EXAM: PORTABLE CHEST 1 VIEW COMPARISON:  02/15/2017 FINDINGS: Stable cardiomegaly. Left-sided pacemaker apparatus with right atrial and right ventricular leads are redemonstrated. There is moderate aortic atherosclerosis with uncoiling of the thoracic aorta as before. Chronic left basilar atelectasis. No acute pneumonic consolidation or overt edema. Surgical clips project of left axilla. Osteoarthritis of the [facility] and glenohumeral joints noted bilaterally. IMPRESSION: Stable cardiomegaly with aortic atherosclerosis. No active pulmonary disease. Left basilar atelectasis. Electronically Signed   By: Ashley Royalty M.D.   On: 02/23/2017 20:17   ____________________________________________    PROCEDURES  Procedure(s) performed: None  Procedures  Critical Care performed: None  ____________________________________________   INITIAL IMPRESSION / ASSESSMENT AND PLAN / ED COURSE  Pertinent labs & imaging results that were available during my care of the patient were reviewed by me and considered in my medical decision making (see chart for details).  Here with fever, and cough.  Also family states she is more confused than baseline.  She seems to be pretty with it at this time to me.  Concern though exist for this patient deteriorating given her very  fragile state multiple medical problems.  Family are very uncomfortable bringing her home.  This is most likely viral flu is negative chest x-ray is negative urine is negative, abdomen is benign.  We will discuss with hospitalist and observational admission.     ____________________________________________   FINAL CLINICAL IMPRESSION(S) / ED DIAGNOSES  Final diagnoses:  Cough  COPD exacerbation ([place])  Fever, unspecified fever cause      This chart was dictated using voice recognition software.  Despite best efforts to proofread,  errors can occur which can change meaning.      Schuyler Amor, MD 02/23/17 2219

## 2017-02-23 NOTE — ED Triage Notes (Signed)
Pt arrived via EMS from [facility] facility with c/o increased SOB x1 week. Pt recent d/c from hospital on 2/11 for respiratory symptoms. Per family pt was having increase SOB when they visited today. Pt has productive cough on arrival, A&O x4. Pt 98% on her usual continuous 3L of O2.

## 2017-02-24 ENCOUNTER — Other Ambulatory Visit: Payer: Self-pay

## 2017-02-24 LAB — EXPECTORATED SPUTUM ASSESSMENT W REFEX TO RESP CULTURE

## 2017-02-24 LAB — BASIC METABOLIC PANEL
ANION GAP: 9 (ref 5–15)
BUN: 12 mg/dL (ref 6–20)
CHLORIDE: 83 mmol/L — AB (ref 101–111)
CO2: 39 mmol/L — AB (ref 22–32)
Calcium: 9 mg/dL (ref 8.9–10.3)
Creatinine, Ser: 0.49 mg/dL (ref 0.44–1.00)
GFR calc non Af Amer: >60 mL/min (ref >60)
Glucose, Bld: 141 mg/dL — ABNORMAL HIGH (ref 65–99)
Potassium: 4.5 mmol/L (ref 3.5–5.1)
Sodium: 131 mmol/L — ABNORMAL LOW (ref 135–145)

## 2017-02-24 LAB — CBC
HCT: 35.2 % (ref 35.0–47.0)
HEMATOCRIT: 35.9 % (ref 35.0–47.0)
HEMOGLOBIN: 11.6 g/dL — AB (ref 12.0–16.0)
HEMOGLOBIN: 11.3 g/dL — AB (ref 12.0–16.0)
MCH: 28.2 pg (ref 26.0–34.0)
MCH: 28.2 pg (ref 26.0–34.0)
MCHC: 32.4 g/dL (ref 32.0–36.0)
MCHC: 32.2 g/dL (ref 32.0–36.0)
MCV: 87.2 fL (ref 80.0–100.0)
MCV: 87.8 fL (ref 80.0–100.0)
Platelets: 354 10*3/uL (ref 150–440)
Platelets: 357 10*3/uL (ref 150–440)
RBC: 4.11 MIL/uL (ref 3.80–5.20)
RBC: 4.01 MIL/uL (ref 3.80–5.20)
RDW: 15.5 % — ABNORMAL HIGH (ref 11.5–14.5)
RDW: 15.6 % — ABNORMAL HIGH (ref 11.5–14.5)
WBC: 12.5 10*3/uL — ABNORMAL HIGH (ref 3.6–11.0)
WBC: 12.4 10*3/uL — ABNORMAL HIGH (ref 3.6–11.0)

## 2017-02-24 LAB — EXPECTORATED SPUTUM ASSESSMENT W GRAM STAIN, RFLX TO RESP C

## 2017-02-24 LAB — CREATININE, SERUM
Creatinine, Ser: 0.4 mg/dL — ABNORMAL LOW (ref 0.44–1.00)
GFR calc Af Amer: >60 mL/min (ref >60)
GFR calc non Af Amer: >60 mL/min (ref >60)

## 2017-02-24 MED ORDER — PREDNISONE 20 MG PO TABS
30.0000 mg | ORAL_TABLET | Freq: Every day | ORAL | Status: AC
  Administered 2017-02-26 – 2017-02-27 (×2): 30 mg via ORAL
  Filled 2017-02-24 (×2): qty 2

## 2017-02-24 MED ORDER — ONDANSETRON HCL 4 MG PO TABS: 4.0000 mg | ORAL_TABLET | Freq: Four times a day (QID) | ORAL | Status: DC | PRN

## 2017-02-24 MED ORDER — MELATONIN 5 MG PO TABS
10.0000 mg | ORAL_TABLET | Freq: Every day | ORAL | Status: DC
  Administered 2017-02-24 – 2017-03-03 (×8): 10 mg via ORAL
  Filled 2017-02-24 (×9): qty 2

## 2017-02-24 MED ORDER — PREDNISONE 20 MG PO TABS
20.0000 mg | ORAL_TABLET | Freq: Every day | ORAL | Status: AC
  Administered 2017-02-28 – 2017-03-01 (×2): 20 mg via ORAL
  Filled 2017-02-24 (×2): qty 1

## 2017-02-24 MED ORDER — GUAIFENESIN-DM 100-10 MG/5ML PO SYRP
5.0000 mL | ORAL_SOLUTION | ORAL | Status: DC | PRN
  Administered 2017-02-24: 5 mL via ORAL
  Filled 2017-02-24: qty 5

## 2017-02-24 MED ORDER — LORAZEPAM 0.5 MG PO TABS
0.5000 mg | ORAL_TABLET | Freq: Two times a day (BID) | ORAL | Status: DC
  Administered 2017-02-24 – 2017-03-02 (×9): 0.5 mg via ORAL
  Filled 2017-02-24 (×11): qty 1

## 2017-02-24 MED ORDER — LORAZEPAM 0.5 MG PO TABS
0.5000 mg | ORAL_TABLET | ORAL | Status: DC | PRN
  Administered 2017-02-25 – 2017-03-02 (×6): 0.5 mg via ORAL
  Filled 2017-02-24 (×5): qty 1

## 2017-02-24 MED ORDER — DULOXETINE HCL 30 MG PO CPEP
60.0000 mg | ORAL_CAPSULE | Freq: Every day | ORAL | Status: DC
  Administered 2017-02-24 – 2017-03-04 (×9): 60 mg via ORAL
  Filled 2017-02-24 (×10): qty 2

## 2017-02-24 MED ORDER — TRAZODONE HCL 100 MG PO TABS
100.0000 mg | ORAL_TABLET | Freq: Every day | ORAL | Status: DC
  Administered 2017-02-24 – 2017-02-28 (×5): 100 mg via ORAL
  Filled 2017-02-24 (×5): qty 1

## 2017-02-24 MED ORDER — ACETAMINOPHEN 325 MG PO TABS
650.0000 mg | ORAL_TABLET | Freq: Four times a day (QID) | ORAL | Status: DC | PRN
  Administered 2017-02-24 – 2017-03-03 (×7): 650 mg via ORAL
  Filled 2017-02-24 (×7): qty 2

## 2017-02-24 MED ORDER — MOMETASONE FURO-FORMOTEROL FUM 200-5 MCG/ACT IN AERO
2.0000 | INHALATION_SPRAY | Freq: Two times a day (BID) | RESPIRATORY_TRACT | Status: DC
  Administered 2017-02-24 – 2017-03-04 (×17): 2 via RESPIRATORY_TRACT
  Filled 2017-02-24: qty 8.8

## 2017-02-24 MED ORDER — FUROSEMIDE 20 MG PO TABS
20.0000 mg | ORAL_TABLET | Freq: Every day | ORAL | Status: DC
  Administered 2017-02-24 – 2017-02-26 (×3): 20 mg via ORAL
  Filled 2017-02-24 (×3): qty 1

## 2017-02-24 MED ORDER — PREDNISONE 50 MG PO TABS
50.0000 mg | ORAL_TABLET | Freq: Every day | ORAL | Status: AC
  Administered 2017-02-24 – 2017-02-25 (×2): 50 mg via ORAL
  Filled 2017-02-24 (×2): qty 1

## 2017-02-24 MED ORDER — ONDANSETRON HCL 4 MG/2ML IJ SOLN: 4.0000 mg | Freq: Four times a day (QID) | INTRAMUSCULAR | Status: DC | PRN

## 2017-02-24 MED ORDER — PRAVASTATIN SODIUM 20 MG PO TABS
10.0000 mg | ORAL_TABLET | Freq: Every day | ORAL | Status: DC
  Administered 2017-02-24 – 2017-03-03 (×8): 10 mg via ORAL
  Filled 2017-02-24 (×8): qty 1

## 2017-02-24 MED ORDER — TRANDOLAPRIL 2 MG PO TABS
2.0000 mg | ORAL_TABLET | Freq: Every day | ORAL | Status: DC
  Administered 2017-02-24 – 2017-03-04 (×9): 2 mg via ORAL
  Filled 2017-02-24 (×9): qty 1

## 2017-02-24 MED ORDER — SENNOSIDES-DOCUSATE SODIUM 8.6-50 MG PO TABS
1.0000 | ORAL_TABLET | Freq: Two times a day (BID) | ORAL | Status: DC
  Administered 2017-02-24 – 2017-03-04 (×16): 1 via ORAL
  Filled 2017-02-24 (×17): qty 1

## 2017-02-24 MED ORDER — ASPIRIN EC 81 MG PO TBEC
81.0000 mg | DELAYED_RELEASE_TABLET | Freq: Every day | ORAL | Status: DC
  Administered 2017-02-24 – 2017-03-04 (×9): 81 mg via ORAL
  Filled 2017-02-24 (×10): qty 1

## 2017-02-24 MED ORDER — IPRATROPIUM-ALBUTEROL 0.5-2.5 (3) MG/3ML IN SOLN
3.0000 mL | Freq: Four times a day (QID) | RESPIRATORY_TRACT | Status: DC
  Administered 2017-02-24 – 2017-02-26 (×8): 3 mL via RESPIRATORY_TRACT
  Filled 2017-02-24 (×8): qty 3

## 2017-02-24 MED ORDER — IPRATROPIUM-ALBUTEROL 20-100 MCG/ACT IN AERS
1.0000 | INHALATION_SPRAY | Freq: Four times a day (QID) | RESPIRATORY_TRACT | Status: DC
Start: 2017-02-24 — End: 2017-02-24

## 2017-02-24 MED ORDER — PREDNISONE 20 MG PO TABS
10.0000 mg | ORAL_TABLET | Freq: Every day | ORAL | Status: AC
  Administered 2017-03-02: 10 mg via ORAL
  Filled 2017-02-24: qty 1

## 2017-02-24 MED ORDER — ACETAMINOPHEN 650 MG RE SUPP: 650.0000 mg | Freq: Four times a day (QID) | RECTAL | Status: DC | PRN

## 2017-02-24 MED ORDER — DIVALPROEX SODIUM 125 MG PO CSDR
250.0000 mg | DELAYED_RELEASE_CAPSULE | Freq: Two times a day (BID) | ORAL | Status: DC
  Administered 2017-02-24 – 2017-03-04 (×17): 250 mg via ORAL
  Filled 2017-02-24 (×18): qty 2

## 2017-02-24 MED ORDER — ENOXAPARIN SODIUM 40 MG/0.4ML ~~LOC~~ SOLN
40.0000 mg | SUBCUTANEOUS | Status: DC
  Administered 2017-02-24 – 2017-03-03 (×8): 40 mg via SUBCUTANEOUS
  Filled 2017-02-24 (×8): qty 0.4

## 2017-02-24 MED ORDER — POTASSIUM CHLORIDE CRYS ER 10 MEQ PO TBCR
10.0000 meq | EXTENDED_RELEASE_TABLET | ORAL | Status: DC
  Administered 2017-02-24 – 2017-03-04 (×5): 10 meq via ORAL
  Filled 2017-02-24 (×6): qty 1

## 2017-02-24 NOTE — Progress Notes (Signed)
Chaplain responded to an OR for Pt wanted company. Pt was ready to sleep. Ch sat with Pt to keep her company and get nurse for assistance. Ch left and told Pt there would be follow up   02/24/17 2200  Clinical Encounter Type  Visited With Patient  Visit Type Initial;Spiritual support  Referral From Nurse  Spiritual Encounters  Spiritual Needs Prayer;Emotional

## 2017-02-24 NOTE — Telephone Encounter (Signed)
Appt 03/03/17 but patient has been re-admitted to hospital 02/23/17.ss

## 2017-02-24 NOTE — Progress Notes (Signed)
[place] [place] NAME: Kathryn Hayes    MR#:  [id number]  DATE OF BIRTH:  1934/01/12  SUBJECTIVE: Admitted for pneumonia, sepsis.  She is still having lots of cough and yellow phlegm.  spoke with patient's son in detail.  Discharged recently a few days ago.  CHIEF COMPLAINT:   Chief Complaint  Patient presents with  . Shortness of Breath    REVIEW OF SYSTEMS:   ROS CONSTITUTIONAL: Fever yesterday patient continued to have worsening shortness of breath that prompted her to come to ER yesterday. EYES: No blurred or double vision.  EARS, NOSE, AND THROAT: No tinnitus or ear pain.  RESPIRATORY: Cough, shortness of breath  cARDIOVASCULAR: No chest pain, orthopnea, edema.  GASTROINTESTINAL: No nausea, vomiting, diarrhea or abdominal pain.  GENITOURINARY: No dysuria, hematuria.  ENDOCRINE: No polyuria, nocturia,  HEMATOLOGY: No anemia, easy bruising or bleeding SKIN: No rash or lesion. MUSCULOSKELETAL: No joint pain or arthritis.   NEUROLOGIC: No tingling, numbness, weakness.  PSYCHIATRY: No anxiety or depression.   DRUG ALLERGIES:  No Known Allergies  VITALS:  Blood pressure (!) 154/102, pulse (!) 108, temperature 97.6 F (36.4 C), temperature source Oral, resp. rate (!) 22, weight 97.5 kg (215 lb), SpO2 91 %.  PHYSICAL EXAMINATION:  GENERAL:  83 y.o.-year-old patient lying in the bed with no acute distress.  EYES: Pupils equal, round, reactive to light . No scleral icterus. Extraocular muscles intact.  HEENT: Head atraumatic, normocephalic. Oropharynx and nasopharynx clear.  NECK:  Supple, no jugular venous distention. No thyroid enlargement, no tenderness.  LUNGS: Faint wheezing bilaterally. CARDIOVASCULAR: S1, S2 normal. No murmurs, rubs, or gallops.  ABDOMEN: Soft, nontender, nondistended. Bowel sounds present. No organomegaly or mass.  EXTREMITIES: No pedal edema, cyanosis, or clubbing.  NEUROLOGIC: Cranial nerves II  through XII are intact. Muscle strength 5/5 in all extremities. Sensation intact. Gait not checked.  PSYCHIATRIC: The patient is alert and oriented x 3.  SKIN: No obvious rash, lesion, or ulcer.    LABORATORY PANEL:   CBC Recent Labs  Lab 02/24/17 0344  WBC 12.4*  HGB 11.3*  HCT 35.2  PLT 357   ------------------------------------------------------------------------------------------------------------------  Chemistries  Recent Labs  Lab 02/22/17 0455  02/24/17 0344  NA 131*   < > 131*  K 4.8   < > 4.5  CL 85*   < > 83*  CO2 34*   < > 39*  GLUCOSE 94   < > 141*  BUN 12   < > 12  CREATININE 0.45   < > 0.49  CALCIUM 8.7*   < > 9.0  AST 22  --   --   ALT 16  --   --   ALKPHOS 38  --   --   BILITOT 0.7  --   --    < > = values in this interval not displayed.   ------------------------------------------------------------------------------------------------------------------  Cardiac Enzymes Recent Labs  Lab 02/23/17 1938  TROPONINI <0.03   ------------------------------------------------------------------------------------------------------------------  RADIOLOGY:  Dg Chest Port 1 View  Result Date: 02/23/2017 CLINICAL DATA:  Increased dyspnea x1 week EXAM: PORTABLE CHEST 1 VIEW COMPARISON:  02/15/2017 FINDINGS: Stable cardiomegaly. Left-sided pacemaker apparatus with right atrial and right ventricular leads are redemonstrated. There is moderate aortic atherosclerosis with uncoiling of the thoracic aorta as before. Chronic left basilar atelectasis. No acute pneumonic consolidation or overt edema. Surgical clips project of left axilla. Osteoarthritis of the [facility] and glenohumeral joints noted bilaterally. IMPRESSION: Stable  cardiomegaly with aortic atherosclerosis. No active pulmonary disease. Left basilar atelectasis. Electronically Signed   By: Ashley Royalty M.D.   On: 02/23/2017 20:17    EKG:   Orders placed or performed during the hospital encounter of 02/23/17  . EKG  12-[place]  . EKG 12-[place]    ASSESSMENT AND PLAN:   Acute on chronic respiratory failure secondary to hospital-acquired pneumonia: Patient is on vancomycin, cefepime, discharged from hospital recently a few days ago after he got admitted for respiratory distress and altered mental status.  This time she had pneumonia continue vancomycin, cefepime, follow blood cultures and also order sputum cultures to narrow down the antibiotics. 2.  Sepsis ruled out. 3.  COPD exacerbation: Continue steroids, oxygen, bronchodilators. 4.  Chronic systolic heart failure, patient previously had echocardiogram recently which showed EF 45-50%. 5.  Chronic hyponatremia: Patient's sodium is better now.  SIADH: Continue to monitor fluids status. #6 diabetes mellitus type 2: Continue sliding scale insulin with coverage. #7 physical therapy consult, bowel regimen. Discussed with son extensively.  All the records are reviewed and case discussed with Care Management/Social Workerr. Management plans discussed with the patient, family and they are in agreement.  CODE STATUS:full  TOTAL TIME TAKING CARE OF THIS PATIENT: 35 minutes.   POSSIBLE D/C IN 1-2 DAYS, DEPENDING ON CLINICAL CONDITION.   Epifanio Lesches M.D on 02/24/2017 at 11:33 AM  Between 7am to 6pm - Pager - [phone]  After 6pm go to [email or web address] - password [email or web address] [place] [facility]  Office  [phone]  CC: Primary care physician; Juluis Pitch, MD   Note: This dictation was prepared with Dragon dictation along with smaller phrase technology. Any transcriptional errors that result from this process are unintentional.

## 2017-02-24 NOTE — Progress Notes (Signed)
Please note Patient is currently followed by out patient PALLIATIVE at [facility]. CSW Shela Leff made aware. Flo Shanks RN, BSN, [place] [facility] [place], [facility] [phone]

## 2017-02-25 ENCOUNTER — Inpatient Hospital Stay
Admit: 2017-02-25 | Discharge: 2017-02-25 | Disposition: A | Discharge status: Home / Self Care | Payer: Medicare Other | Attending: Internal Medicine | Admitting: Internal Medicine

## 2017-02-25 LAB — VANCOMYCIN, TROUGH: VANCOMYCIN TR: 18 ug/mL (ref 15–20)

## 2017-02-25 MED ORDER — VANCOMYCIN HCL IN DEXTROSE 750-5 MG/150ML-% IV SOLN
750.0000 mg | Freq: Two times a day (BID) | INTRAVENOUS | Status: DC
  Administered 2017-02-26: 750 mg via INTRAVENOUS
  Filled 2017-02-25 (×2): qty 150

## 2017-02-25 MED ORDER — PERFLUTREN LIPID MICROSPHERE
1.0000 mL | INTRAVENOUS | Status: AC | PRN
  Administered 2017-02-25: 2 mL via INTRAVENOUS
  Filled 2017-02-25: qty 10

## 2017-02-25 NOTE — Clinical Social Work Note (Signed)
Clinical Social Work Assessment  Patient Details  Name: Kathryn Hayes MRN: [id number] Date of Birth: 09-20-1934  Date of referral:  02/25/17               Reason for consult:  Discharge Planning                Permission sought to share information with:    Permission granted to share information::     Name::        Agency::     Relationship::     Contact Information:     Housing/Transportation Living arrangements for the past 2 months:  [place] of Information:  Adult Children Patient Interpreter Needed:  None Criminal Activity/Legal Involvement Pertinent to Current Situation/Hospitalization:  No - Comment as needed Significant Relationships:  Adult Children Lives with:  Facility Resident Do you feel safe going back to the place where you live?  Yes Need for family participation in patient care:  Yes (Comment)  Care giving concerns:  Patient is a [place] term resident at [facility].   Social Worker assessment / plan:  Patient was very recently discharged to [facility]. CSW attempted to see patient however she was sleeping soundly at time of visit. CSW contacted patient's daughter, Kathryn Hayes: [phone]. Mrs. Valere Dross confirmed she is [place] along with her brother Kathryn Hayes. [name] wishes for patient to return to [place] when time. CSW has been informed by Sharyn Lull at [facility] that patient can return.  Employment status:  Retired Nurse, adult PT Recommendations:    Information / Referral to community resources:     Patient/Family's Response to care:  Patient's daughter expressed appreciation for CSW assistance.   Patient/Family's Understanding of and Emotional Response to Diagnosis, Current Treatment, and Prognosis:  Patient's daughter asked that the physician call her with an update. CSW has informed physician.   Emotional Assessment Appearance:    Attitude/Demeanor/Rapport:    Affect (typically  observed):  (patient was sleeping at time of CSW visit) Orientation:    Alcohol / Substance use:  Not Applicable Psych involvement (Current and /or in the community):  No (Comment)  Discharge Needs  Concerns to be addressed:  Care Coordination Readmission within the last 30 days:  Yes Current discharge risk:  None Barriers to Discharge:  No Barriers Identified   Shela Leff, LCSW 02/25/2017, 11:56 AM

## 2017-02-25 NOTE — Progress Notes (Signed)
*  PRELIMINARY RESULTS* Echocardiogram 2D Echocardiogram has been performed.  Sherrie Sport 02/25/2017, 2:03 PM

## 2017-02-25 NOTE — Progress Notes (Signed)
Pharmacy Antibiotic Note  Kathryn Hayes is a 83 y.o. female admitted on 02/23/2017 with pneumonia and sepsis.  Pharmacy has been consulted for vancomycin and cefepime dosing.  Plan: DW 72 kg  Vd 50 L kei 0.058 hr-1  t1/2 12 hours Vancomycin 1 gram q 12 hours ordered with stacked dosing. Level before 5th dose. Goal trough 15-20  Cefepime 2 grams q 8 hours ordered  02/25/2017 1835 vancomycin trough 18 mcg/mL, drawn about an hour late and last dose given about an hour early (extra 2 hour gap in interval). Adjusted estimated trough 20.1 mcg/mL. Will adjust to vancomycin 750 mg IV Q12H, predicted trough 16 mcg/mL. Pharmacy will continue to follow and adjust as needed to maintain trough 15 to 20 mcg/mL.   Continue cefepime as ordered.   Weight: 215 lb (97.5 kg)  Temp (24hrs), Avg:98 F (36.7 C), Min:97.9 F (36.6 C), Max:98.1 F (36.7 C)  Recent Labs  Lab 02/22/17 0455 02/23/17 1938 02/24/17 0154 02/24/17 0344 02/25/17 1835  WBC 14.8* 14.4* 12.5* 12.4*  --   CREATININE 0.45 0.48 0.40* 0.49  --   VANCOTROUGH  --   --   --   --  18    Estimated Creatinine Clearance: 63.6 mL/min (by C-G formula based on SCr of 0.49 mg/dL).    No Known Allergies  Antimicrobials this admission: Vancomycin, cefepime 2/20  >>    >>   Dose adjustments this admission:   Microbiology results: 2/20 BCx: pending   Thank you for allowing pharmacy to be a part of this patient's care.  Laural Benes 02/25/2017 7:11 PM

## 2017-02-25 NOTE — Progress Notes (Signed)
PT Cancellation Note  Patient Details Name: Kathryn Hayes MRN: [id number] DOB: September 24, 1934   Cancelled Treatment:    Reason Eval/Treat Not Completed: PT screened, no needs identified, will sign off.  PT consult received; chart reviewed.  Per PT note from 02/17/17 (about 1 week ago): "Per discussion with patient, patient is [facility] resident and has been at facility for 12-18 months.  Patient is WC level at baseline and uses hoyer lift for all transfers; dep care for ADLs and hygiene at facility.  Has not been ambulatory for approx 2 years per patient report.  Did participate with course of therapy, but was not able to make significant functional gains.  Above status confirmed with caregiver at facility.  Patient appears at baseline level of functional ability without acute change in status required to necessitate skilled PT services at this time.  Will complete order at this time.  CSW informed/aware and confirms above information as well)".  PT discussed pt with CSW again to confirm information.  No acute change in status noted necessitating skilled PT services.  Will complete PT order (CSW notified).  Leitha Bleak, PT 02/25/17, 1:19 PM [phone]

## 2017-02-25 NOTE — Progress Notes (Signed)
Pt was on telephone and stated she is not ready for the CPAP.

## 2017-02-25 NOTE — Progress Notes (Signed)
Chaplain followed up on the pastoral care consult. Patient said she was fine. Chaplain told the patient to have her nurse page the on-call chaplain if spiritual care is needed.

## 2017-02-25 NOTE — Progress Notes (Signed)
[facility] at [place] NAME: Kathryn Hayes    MR#:  [id number]  DATE OF BIRTH:  Oct 09, 1934  SUBJECTIVE: Admitted for pneumonia, sepsis.  She states she is feeling little better than yesterday but continues to have poor p.o. intake.  I spoke with patient's daughter over the phone.  CHIEF COMPLAINT:   Chief Complaint  Patient presents with  . Shortness of Breath    REVIEW OF SYSTEMS:   ROS CONSTITUTIONAL:  EYES: No blurred vision.  Patient is still gurgling. EARS, NOSE, AND THROAT: No tinnitus or ear pain.  RESPIRATORY: Cough, shortness of breath  cARDIOVASCULAR: No chest pain, orthopnea, edema.  GASTROINTESTINAL: No nausea, vomiting, diarrhea or abdominal pain.  GENITOURINARY: No dysuria, hematuria.  ENDOCRINE: No polyuria, nocturia,  HEMATOLOGY: No anemia, easy bruising or bleeding SKIN: No rash or lesion. MUSCULOSKELETAL: No joint pain or arthritis.   NEUROLOGIC: No tingling, numbness, weakness.  PSYCHIATRY: No anxiety or depression.   DRUG ALLERGIES:  No Known Allergies  VITALS:  Blood pressure (!) 143/62, pulse (!) 115, temperature 98.1 F (36.7 C), temperature source Oral, resp. rate 18, weight 97.5 kg (215 lb), SpO2 90 %.  PHYSICAL EXAMINATION:  GENERAL:  83 y.o.-year-old patient lying in the bed with no acute distress.  EYES: Pupils equal, round, reactive to light . No scleral icterus. Extraocular muscles intact.  HEENT: Head atraumatic, normocephalic. Oropharynx and nasopharynx clear.  NECK:  Supple, no jugular venous distention. No thyroid enlargement, no tenderness.  LUNGS: Faint wheezing bilaterally. CARDIOVASCULAR: S1, S2 normal. No murmurs, rubs, or gallops.  ABDOMEN: Soft, nontender, nondistended. Bowel sounds present. No organomegaly or mass.  EXTREMITIES: No pedal edema, cyanosis, or clubbing.  NEUROLOGIC: Cranial nerves II through XII are intact. Muscle strength 5/5 in all extremities. Sensation intact. Gait  not checked.  PSYCHIATRIC: The patient is alert and oriented x 3.  SKIN: No obvious rash, lesion, or ulcer.  Patient is obese, mostly bedridden and uses Hoyer lift. LABORATORY PANEL:   CBC Recent Labs  Lab 02/24/17 0344  WBC 12.4*  HGB 11.3*  HCT 35.2  PLT 357   ------------------------------------------------------------------------------------------------------------------  Chemistries  Recent Labs  Lab 02/22/17 0455  02/24/17 0344  NA 131*   < > 131*  K 4.8   < > 4.5  CL 85*   < > 83*  CO2 34*   < > 39*  GLUCOSE 94   < > 141*  BUN 12   < > 12  CREATININE 0.45   < > 0.49  CALCIUM 8.7*   < > 9.0  AST 22  --   --   ALT 16  --   --   ALKPHOS 38  --   --   BILITOT 0.7  --   --    < > = values in this interval not displayed.   ------------------------------------------------------------------------------------------------------------------  Cardiac Enzymes Recent Labs  Lab 02/23/17 1938  TROPONINI <0.03   ------------------------------------------------------------------------------------------------------------------  RADIOLOGY:  Dg Chest Port 1 View  Result Date: 02/23/2017 CLINICAL DATA:  Increased dyspnea x1 week EXAM: PORTABLE CHEST 1 VIEW COMPARISON:  02/15/2017 FINDINGS: Stable cardiomegaly. Left-sided pacemaker apparatus with right atrial and right ventricular leads are redemonstrated. There is moderate aortic atherosclerosis with uncoiling of the thoracic aorta as before. Chronic left basilar atelectasis. No acute pneumonic consolidation or overt edema. Surgical clips project of left axilla. Osteoarthritis of the [facility] and glenohumeral joints noted bilaterally. IMPRESSION: Stable cardiomegaly with aortic atherosclerosis. No active pulmonary disease.  Left basilar atelectasis. Electronically Signed   By: Ashley Royalty M.D.   On: 02/23/2017 20:17    EKG:   Orders placed or performed during the hospital encounter of 02/23/17  . EKG 12-Lead  . EKG 12-Lead     ASSESSMENT AND PLAN:   Acute on chronic respiratory failure secondary to hospital-acquired pneumonia: Patient is on vancomycin, cefepime, discharged from hospital recently a few days ago after he got admitted for respiratory distress and altered mental status.  This time she had pneumonia continue vancomycin, cefepime, fo follow-up follow sputum cultures that were done yesterday.   2.  Sepsis ruled out. 3.  COPD exacerbation: Continue steroids, patient on prednisone, oxygen, bronchodilators. 4.  Chronic systolic heart failure, patient to have echo done today, follow the echo results today.  Continue small dose Lasix.  5.  Chronic hyponatremia: Patient's sodium is better now.  SIADH: Continue to monitor fluids status.  #6 diabetes mellitus type 2: Continue sliding scale insulin with coverage.   #7 physical therapy consult, bowel regimen. Discussed with son extensively. 8.deconditioning;bedridden  All the records are reviewed and case discussed with Care Management/Social Workerr. Management plans discussed with the patient, family and they are in agreement.  CODE STATUS:full  TOTAL TIME TAKING CARE OF THIS PATIENT: 35 minutes.   POSSIBLE D/C IN 1-2 DAYS, DEPENDING ON CLINICAL CONDITION.   Epifanio Lesches M.D on 02/25/2017 at 2:43 PM  Between 7am to 6pm - Pager - [phone]  After 6pm go to [email or web address] - password [email or web address] [facility]  Office  [phone]  CC: Primary care physician; Juluis Pitch, MD   Note: This dictation was prepared with Dragon dictation along with smaller phrase technology. Any transcriptional errors that result from this process are unintentional.

## 2017-02-25 NOTE — Progress Notes (Signed)
Pt continues to receive duo nebs, lasix and IV abx

## 2017-02-25 NOTE — Care Management Important Message (Signed)
Important Message  Patient Details  Name: Kathryn Hayes MRN: [id number] Date of Birth: 27-Apr-1934   Medicare Important Message Given:  Yes    Beverly Sessions, RN 02/25/2017, 11:45 AM

## 2017-02-25 NOTE — Progress Notes (Signed)
[facility] at [place] NAME: Kathryn Hayes    MR#:  [id number]  DATE OF BIRTH:  [birth year]  SUBJECTIVE: Admitted for pneumonia, sepsis.  She states she is feeling little better than yesterday but continues to have poor p.o. intake.  I spoke with patient's daughter over the phone.  CHIEF COMPLAINT:   Chief Complaint  Patient presents with  . Shortness of Breath    REVIEW OF SYSTEMS:   ROS CONSTITUTIONAL:  EYES: No blurred vision.  Patient is still gurgling. EARS, NOSE, AND THROAT: No tinnitus or ear pain.  RESPIRATORY: Cough, shortness of breath  cARDIOVASCULAR: No chest pain, orthopnea, edema.  GASTROINTESTINAL: No nausea, vomiting, diarrhea or abdominal pain.  GENITOURINARY: No dysuria, hematuria.  ENDOCRINE: No polyuria, nocturia,  HEMATOLOGY: No anemia, easy bruising or bleeding SKIN: No rash or lesion. MUSCULOSKELETAL: No joint pain or arthritis.   NEUROLOGIC: No tingling, numbness, weakness.  PSYCHIATRY: No anxiety or depression.   DRUG ALLERGIES:  No Known Allergies  VITALS:  Blood pressure (!) 143/62, pulse (!) 115, temperature 98.1 F (36.7 C), temperature source Oral, resp. rate 18, weight 97.5 kg (215 lb), SpO2 90 %.  PHYSICAL EXAMINATION:  GENERAL:  83 y.o.-year-old patient lying in the bed with no acute distress.  EYES: Pupils equal, round, reactive to light . No scleral icterus. Extraocular muscles intact.  HEENT: Head atraumatic, normocephalic. Oropharynx and nasopharynx clear.  NECK:  Supple, no jugular venous distention. No thyroid enlargement, no tenderness.  LUNGS: Faint wheezing bilaterally. CARDIOVASCULAR: S1, S2 normal. No murmurs, rubs, or gallops.  ABDOMEN: Soft, nontender, nondistended. Bowel sounds present. No organomegaly or mass.  EXTREMITIES: No pedal edema, cyanosis, or clubbing.  NEUROLOGIC: Cranial nerves II through XII are intact. Muscle strength 5/5 in all extremities. Sensation intact. Gait  not checked.  PSYCHIATRIC: The patient is alert and oriented x 3.  SKIN: No obvious rash, lesion, or ulcer.  Patient is obese, mostly bedridden and uses Hoyer lift. LABORATORY PANEL:   CBC Recent Labs  Lab 02/24/17 0344  WBC 12.4*  HGB 11.3*  HCT 35.2  PLT 357   ------------------------------------------------------------------------------------------------------------------  Chemistries  Recent Labs  Lab 02/22/17 0455  02/24/17 0344  NA 131*   < > 131*  K 4.8   < > 4.5  CL 85*   < > 83*  CO2 34*   < > 39*  GLUCOSE 94   < > 141*  BUN 12   < > 12  CREATININE 0.45   < > 0.49  CALCIUM 8.7*   < > 9.0  AST 22  --   --   ALT 16  --   --   ALKPHOS 38  --   --   BILITOT 0.7  --   --    < > = values in this interval not displayed.   ------------------------------------------------------------------------------------------------------------------  Cardiac Enzymes Recent Labs  Lab 02/23/17 1938  TROPONINI <0.03   ------------------------------------------------------------------------------------------------------------------  RADIOLOGY:  Dg Chest Port 1 View  Result Date: 02/23/2017 CLINICAL DATA:  Increased dyspnea x1 week EXAM: PORTABLE CHEST 1 VIEW COMPARISON:  02/15/2017 FINDINGS: Stable cardiomegaly. Left-sided pacemaker apparatus with right atrial and right ventricular leads are redemonstrated. There is moderate aortic atherosclerosis with uncoiling of the thoracic aorta as before. Chronic left basilar atelectasis. No acute pneumonic consolidation or overt edema. Surgical clips project of left axilla. Osteoarthritis of the [facility] and glenohumeral joints noted bilaterally. IMPRESSION: Stable cardiomegaly with aortic atherosclerosis. No active pulmonary disease.  Left basilar atelectasis. Electronically Signed   By: Ashley Royalty M.D.   On: 02/23/2017 20:17    EKG:   Orders placed or performed during the hospital encounter of 02/23/17  . EKG 12-Lead  . EKG 12-Lead     ASSESSMENT AND PLAN:   Acute on chronic respiratory failure secondary to hospital-acquired pneumonia: Patient is on vancomycin, cefepime, discharged from hospital recently a few days ago after he got admitted for respiratory distress and altered mental status.  This time she had pneumonia continue vancomycin, cefepime, fo follow-up follow sputum cultures that were done yesterday.   2.  Sepsis ruled out. 3.  COPD exacerbation: Continue steroids, patient on prednisone, oxygen, bronchodilators. 4.  Chronic systolic heart failure, patient to have echo done today, follow the echo results today.  Continue small dose Lasix.  5.  Chronic hyponatremia: Patient's sodium is better now.  SIADH: Continue to monitor fluids status.  #6 diabetes mellitus type 2: Continue sliding scale insulin with cover 8.deconditioning;bedridden, uses [employer] lift. Discussed with patient's daughter over the phone today.   All the records are reviewed and case discussed with Care Management/Social Workerr. Management plans discussed with the patient, family and they are in agreement.  CODE STATUS:full  TOTAL TIME TAKING CARE OF THIS PATIENT: 35 minutes.   POSSIBLE D/C IN 1-2 DAYS, DEPENDING ON CLINICAL CONDITION.   Epifanio Lesches M.D on 02/25/2017 at 3:15 PM  Between 7am to 6pm - Pager - [phone]  After 6pm go to [email or web address] - password [email or web address] [facility]  Office  [phone]  CC: Primary care physician; Juluis Pitch, MD   Note: This dictation was prepared with Dragon dictation along with smaller phrase technology. Any transcriptional errors that result from this process are unintentional.

## 2017-02-26 LAB — ECHOCARDIOGRAM COMPLETE
AV Peak grad: 3 mmHg
AV peak Index: 1.31
AV pk vel: 91.6 cm/s
AVAREAVTI: 2.8 cm2
Ao pk vel: 0.74 m/s
Area-P 1/2: 6.67 cm2
CHL CUP MV DEC (S): 113
E/e' ratio: 6.06
EWDT: 113 ms
FS: 33 % (ref 28–44)
IVS/LV PW RATIO, ED: 0.81
LA diam end sys: 37 mm
LA diam index: 1.73 cm/m2
LA vol A4C: 50.3 ml
LASIZE: 37 mm
LAVOL: 60.5 mL
LAVOLIN: 28.2 mL/m2
LDCA: 3.8 cm2
LV E/e' medial: 6.06
LV E/e'average: 6.06
LV PW d: 16 mm — AB (ref 0.6–1.1)
LV TDI E'LATERAL: 14.4
LV e' LATERAL: 14.4 cm/s
LVOT peak vel: 67.4 cm/s
LVOTD: 22 mm
Lateral S' vel: 12 cm/s
MV pk E vel: 87.3 m/s
MVPG: 3 mmHg
MVPKAVEL: 58.7 m/s
MVSPHT: 33 ms
TAPSE: 32.5 mm
TDI e' medial: 12.5
WEIGHTICAEL: 3440 [oz_av]

## 2017-02-26 LAB — MRSA PCR SCREENING: MRSA BY PCR: NEGATIVE

## 2017-02-26 LAB — PROCALCITONIN: Procalcitonin: 0.13 ng/mL

## 2017-02-26 MED ORDER — IPRATROPIUM-ALBUTEROL 0.5-2.5 (3) MG/3ML IN SOLN
3.0000 mL | Freq: Three times a day (TID) | RESPIRATORY_TRACT | Status: DC
  Administered 2017-02-26 – 2017-03-04 (×18): 3 mL via RESPIRATORY_TRACT
  Filled 2017-02-26 (×18): qty 3

## 2017-02-26 MED ORDER — GUAIFENESIN ER 600 MG PO TB12
600.0000 mg | ORAL_TABLET | Freq: Two times a day (BID) | ORAL | Status: DC
  Administered 2017-02-26 – 2017-03-04 (×12): 600 mg via ORAL
  Filled 2017-02-26 (×13): qty 1

## 2017-02-26 NOTE — Progress Notes (Signed)
[facility] at [place] NAME: Kathryn Hayes    MR#:  [id number]  DATE OF BIRTH:  [birth year]  SUBJECTIVE:  CHIEF COMPLAINT:   Chief Complaint  Patient presents with  . Shortness of Breath   - more sleepy today and repeating words - family at bedside - has a raspy cough  REVIEW OF SYSTEMS:  Review of Systems  Unable to perform ROS: Mental status change    DRUG ALLERGIES:  No Known Allergies  VITALS:  Blood pressure 136/72, pulse (!) 112, temperature 98.2 F (36.8 C), temperature source Oral, resp. rate 20, height 5\' 4"  (1.626 m), weight 97.5 kg (215 lb), SpO2 95 %.  PHYSICAL EXAMINATION:  Physical Exam  GENERAL:  83 y.o.-year-old patient lying in the bed with no acute distress. sleepy EYES: Pupils equal, round, reactive to light and accommodation. No scleral icterus. Extraocular muscles intact.  HEENT: Head atraumatic, normocephalic. Oropharynx and nasopharynx clear.  NECK:  Supple, no jugular venous distention. No thyroid enlargement, no tenderness.  LUNGS: scattered wheezing, bibasilar rhonchi. No use of accessory muscles of respiration.  CARDIOVASCULAR: S1, S2 normal. No murmurs, rubs, or gallops.  ABDOMEN: Soft, nontender, nondistended. Bowel sounds present. No organomegaly or mass.  EXTREMITIES: No pedal edema, cyanosis, or clubbing.  NEUROLOGIC: Cranial nerves II through XII are intact. Sleepy, not following simple commands.. Sensation intact. Gait not checked.  PSYCHIATRIC: The patient is sleepy, arousable and answering some simple questions. repeating words SKIN: No obvious rash, lesion, or ulcer.    LABORATORY PANEL:   CBC Recent Labs  Lab 02/24/17 0344  WBC 12.4*  HGB 11.3*  HCT 35.2  PLT 357   ------------------------------------------------------------------------------------------------------------------  Chemistries  Recent Labs  Lab 02/22/17 0455  02/24/17 0344  NA 131*   < > 131*  K 4.8   < > 4.5    CL 85*   < > 83*  CO2 34*   < > 39*  GLUCOSE 94   < > 141*  BUN 12   < > 12  CREATININE 0.45   < > 0.49  CALCIUM 8.7*   < > 9.0  AST 22  --   --   ALT 16  --   --   ALKPHOS 38  --   --   BILITOT 0.7  --   --    < > = values in this interval not displayed.   ------------------------------------------------------------------------------------------------------------------  Cardiac Enzymes Recent Labs  Lab 02/23/17 1938  TROPONINI <0.03   ------------------------------------------------------------------------------------------------------------------  RADIOLOGY:  No results found.  EKG:   Orders placed or performed during the hospital encounter of 02/23/17  . EKG 12-Lead  . EKG 12-Lead    ASSESSMENT AND PLAN:   83 y/o F with COPD on 2l chronic o2, OSA on CPAP, arthritis, h/o breast cancer and colon cancer, DM who is bedbound at baseline, from a [facility] term care presented with shortness of breath and productive cough.  1. HCAP- CXR with left basilar atelectasis - MRSA pcr is pending - vancomycin discontinued, on cefepime - on mucinex. Chest PT ordered -incentive spirometry when more alert  2. Acute encephalopathy- abg ordered to r/o co2 narcosis No focal deficits On ativan- but daughter requests not to hold tonight as patient gets very agitated at nights  3. COPD exacerbation- acute on chronic - on oral steroids, nebs and inhalers  4. Chronic diastolic heart failure- on oral lasix, cough worsened today F/u CXR in am  5. DVT Prophylaxis-  lovenox  From [facility] term facility   All the records are reviewed and case discussed with Care Management/Social Workerr. Management plans discussed with the patient, family and they are in agreement.  CODE STATUS: Full Code  TOTAL TIME TAKING CARE OF THIS PATIENT: 39 minutes.   POSSIBLE D/C IN 2-3 DAYS, DEPENDING ON CLINICAL CONDITION.   Gladstone Lighter M.D on 02/26/2017 at 4:43 PM  Between 7am to 6pm - Pager -  [phone]  After 6pm go to [email or web address] - password [email or web address] [facility]  Office  [phone]  CC: Primary care physician; Juluis Pitch, MD

## 2017-02-27 ENCOUNTER — Inpatient Hospital Stay: Payer: Medicare Other

## 2017-02-27 LAB — CBC
HEMATOCRIT: 35.7 % (ref 35.0–47.0)
HEMOGLOBIN: 12 g/dL (ref 12.0–16.0)
MCH: 28.9 pg (ref 26.0–34.0)
MCHC: 33.5 g/dL (ref 32.0–36.0)
MCV: 86.4 fL (ref 80.0–100.0)
Platelets: 259 10*3/uL (ref 150–440)
RBC: 4.13 MIL/uL (ref 3.80–5.20)
RDW: 14.8 % — ABNORMAL HIGH (ref 11.5–14.5)
WBC: 7.1 10*3/uL (ref 3.6–11.0)

## 2017-02-27 LAB — CULTURE, RESPIRATORY W GRAM STAIN

## 2017-02-27 LAB — BASIC METABOLIC PANEL
ANION GAP: 11 (ref 5–15)
BUN: 15 mg/dL (ref 6–20)
CO2: 31 mmol/L (ref 22–32)
Calcium: 8.9 mg/dL (ref 8.9–10.3)
Chloride: 80 mmol/L — ABNORMAL LOW (ref 101–111)
Creatinine, Ser: 0.62 mg/dL (ref 0.44–1.00)
GFR calc Af Amer: >60 mL/min (ref >60)
GLUCOSE: 102 mg/dL — AB (ref 65–99)
POTASSIUM: 3.7 mmol/L (ref 3.5–5.1)
Sodium: 122 mmol/L — ABNORMAL LOW (ref 135–145)

## 2017-02-27 LAB — SODIUM
SODIUM: 121 mmol/L — AB (ref 135–145)
Sodium: 122 mmol/L — ABNORMAL LOW (ref 135–145)

## 2017-02-27 LAB — CULTURE, RESPIRATORY

## 2017-02-27 LAB — OSMOLALITY, URINE: OSMOLALITY UR: 156 mosm/kg — AB (ref 300–900)

## 2017-02-27 LAB — TSH: TSH: 1.448 u[IU]/mL (ref 0.350–4.500)

## 2017-02-27 LAB — SODIUM, URINE, RANDOM

## 2017-02-27 MED ORDER — ALBUMIN HUMAN 25 % IV SOLN
25.0000 g | Freq: Once | INTRAVENOUS | Status: AC
  Administered 2017-02-27: 25 g via INTRAVENOUS
  Filled 2017-02-27: qty 100

## 2017-02-27 MED ORDER — NYSTATIN 100000 UNIT/GM EX POWD
Freq: Two times a day (BID) | CUTANEOUS | Status: DC
  Administered 2017-02-27 – 2017-03-04 (×10): via TOPICAL
  Filled 2017-02-27: qty 15

## 2017-02-27 MED ORDER — NYSTATIN 100000 UNIT/ML MT SUSP
5.0000 mL | Freq: Four times a day (QID) | OROMUCOSAL | Status: DC
Start: 2017-02-27 — End: 2017-03-04
  Administered 2017-02-27 – 2017-03-04 (×17): 500000 [IU] via OROMUCOSAL
  Filled 2017-02-27 (×14): qty 5

## 2017-02-27 MED ORDER — SODIUM CHLORIDE 0.9 % IV SOLN
INTRAVENOUS | Status: DC
  Administered 2017-02-27 – 2017-03-02 (×4): via INTRAVENOUS

## 2017-02-27 NOTE — Progress Notes (Addendum)
[name]    MR#:  [id number]  DATE OF BIRTH:  05/20/1934  SUBJECTIVE:  CHIEF COMPLAINT:   Chief Complaint  Patient presents with  . Shortness of Breath   -Remains sleepy today. Sodium is noted to have dropped down to 122  REVIEW OF SYSTEMS:  Review of Systems  Unable to perform ROS: Mental status change    DRUG ALLERGIES:  No Known Allergies  VITALS:  Blood pressure 137/76, pulse 99, temperature 98.2 F (36.8 C), temperature source Oral, resp. rate 20, height 5\' 4"  (1.626 m), weight 97.5 kg (215 lb), SpO2 99 %.  PHYSICAL EXAMINATION:  Physical Exam  GENERAL:  83 y.o.-year-old patient lying in the bed with no acute distress. sleepy EYES: Pupils equal, round, reactive to light and accommodation. No scleral icterus. Extraocular muscles intact.  HEENT: Head atraumatic, normocephalic. Oropharynx and nasopharynx clear.  NECK:  Supple, no jugular venous distention. No thyroid enlargement, no tenderness.  LUNGS: scattered wheezing, bibasilar rhonchi. No use of accessory muscles of respiration.  CARDIOVASCULAR: S1, S2 normal. No murmurs, rubs, or gallops.  ABDOMEN: Soft, nontender, nondistended. Bowel sounds present. No organomegaly or mass.  EXTREMITIES: No pedal edema, cyanosis, or clubbing.  NEUROLOGIC: Cranial nerves II through XII are intact. Sleepy, not following simple commands.. Sensation intact. Gait not checked.  PSYCHIATRIC: The patient is sleepy, arousable and answering some simple questions. repeating words SKIN: No obvious rash, lesion, or ulcer.    LABORATORY PANEL:   CBC Recent Labs  Lab 02/27/17 0748  WBC 7.1  HGB 12.0  HCT 35.7  PLT 259   ------------------------------------------------------------------------------------------------------------------  Chemistries  Recent Labs  Lab 02/22/17 0455  02/27/17 0748 02/27/17 0929  NA 131*   < > 122* 122*  K 4.8   < > 3.7  --     CL 85*   < > 80*  --   CO2 34*   < > 31  --   GLUCOSE 94   < > 102*  --   BUN 12   < > 15  --   CREATININE 0.45   < > 0.62  --   CALCIUM 8.7*   < > 8.9  --   AST 22  --   --   --   ALT 16  --   --   --   ALKPHOS 38  --   --   --   BILITOT 0.7  --   --   --    < > = values in this interval not displayed.   ------------------------------------------------------------------------------------------------------------------  Cardiac Enzymes Recent Labs  Lab 02/23/17 1938  TROPONINI <0.03   ------------------------------------------------------------------------------------------------------------------  RADIOLOGY:  Dg Chest Port 1 View  Result Date: 02/27/2017 CLINICAL DATA:  Shortness of breath and mental status change EXAM: PORTABLE CHEST 1 VIEW COMPARISON:  February 23, 2017 FINDINGS: Stable pacemaker. The heart, hila, mediastinum, lungs, and pleura are unchanged. Mild atelectasis in the left base. IMPRESSION: No active disease. Electronically Signed   By: Dorise Bullion III M.D   On: 02/27/2017 07:36    EKG:   Orders placed or performed during the hospital encounter of 02/23/17  . EKG 12-Lead  . EKG 12-Lead    ASSESSMENT AND PLAN:   83 y/o F with COPD on 2l chronic o2, OSA on CPAP, arthritis, h/o breast cancer and colon cancer, DM who is bedbound at baseline, from a [facility] term care presented with shortness of  breath and productive cough.  1. HCAP- CXR with left basilar atelectasis - MRSA pcr is pending - vancomycin discontinued, on cefepime - on mucinex. Chest PT ordered -incentive spirometry when more alert  2. Acute encephalopathy- secondary to acute hyponatremia -Patient has chronic hyponatremia baseline sodium series between 128 to 130. -ABG ordered yesterday with normal CO2, has metabolic alkalosis. -No focal neurological deficits. Sodium is a 122. -Hold Lasix. Gentle hydration trial today. Urine sodium has been ordered. -Nephrology has been consulted. Repeat  sodium check later today and tomorrow -No renal disease, no systolic heart failure at baseline. Right upper upper quadrant ultrasound ordered to rule out cirrhosis. -One dose of albumin infusion for now  3. COPD exacerbation- acute on chronic - on oral steroids, nebs and inhalers  4. Chronic diastolic heart failure- chest x-ray with no pulmonary edema. Lasix has been held today  5. DVT Prophylaxis- lovenox  From [facility] term facility Updated daughter over the phone this morning   All the records are reviewed and case discussed with Care Management/Social Workerr. Management plans discussed with the patient, family and they are in agreement.  CODE STATUS: Full Code  TOTAL TIME TAKING CARE OF THIS PATIENT: 38 minutes.   POSSIBLE D/C IN 2-3 DAYS, DEPENDING ON CLINICAL CONDITION.   Gladstone Lighter M.D on 02/27/2017 at 3:24 PM  Between 7am to 6pm - Pager - [phone]  After 6pm go to [email or web address] - password [email or web address] [facility]  Office  [phone]  CC: Primary care physician; Juluis Pitch, MD

## 2017-02-27 NOTE — Consult Note (Signed)
Date: 02/27/2017                  Patient Name:  Kathryn Hayes  MRN: [id number]  DOB: 1934-11-27  Age / Sex: 83 y.o., female         PCP: Juluis Pitch, MD                 Service Requesting Consult: IM/ Gladstone Lighter, MD                 Reason for Consult: Hyponatremia            History of Present Illness: Patient is a 83 y.o. female with medical problems of COPD, diabetes, history of breast cancer and colon cancer, hypertension, sleep apnea, obesity,  bedbound status, who was admitted to [facility] on 02/23/2017 for evaluation of  increasing shortness of breath.  Patient is a resident [facility] patient is very somnolent and is not able to provide any meaningful information.  All information is obtained from the chart and primary team. Nephrology consult requested for hyponatremia Her baseline sodium appears to be 131 noted on February 24, 2017 Admission sodium is 122   Medications: Outpatient medications: Medications Prior to Admission  Medication Sig Dispense Refill Last Dose  . acetaminophen (TYLENOL) 325 MG tablet Take 650 mg by mouth every 4 (four) hours as needed for mild pain, moderate pain or fever. Do not exceed 3000 mg in 24hrs    prn at prn  . albuterol (PROVENTIL) (2.5 MG/3ML) 0.083% nebulizer solution Take 2.5 mg by nebulization every 6 (six) hours as needed for wheezing or shortness of breath.    prn at prn  . aspirin EC 81 MG tablet Take 81 mg by mouth daily.   02/23/2017 at Unknown time  . bisacodyl (DULCOLAX) 5 MG EC tablet Take 2 tablets (10 mg total) by mouth daily as needed for moderate constipation. 30 tablet 0 prn at prn  . budesonide (PULMICORT) 0.5 MG/2ML nebulizer solution Take 0.5 mg by nebulization 2 (two) times daily.   02/23/2017 at Unknown time  . Cholecalciferol 2000 units CAPS Take 2,000 Units by mouth daily.   02/23/2017 at Unknown time  . divalproex (DEPAKOTE SPRINKLE) 125 MG capsule Take 250 mg by mouth 2 (two) times daily.    02/23/2017  at Unknown time  . docusate sodium (COLACE) 100 MG capsule Take 100 mg by mouth daily.    02/23/2017 at Unknown time  . DULoxetine (CYMBALTA) 60 MG capsule Take 60 mg by mouth daily.    02/23/2017 at Unknown time  . fluticasone-salmeterol (ADVAIR HFA) 115-21 MCG/ACT inhaler Inhale 2 puffs into the lungs 2 (two) times daily. 8 am and 8 pm   02/23/2017 at Unknown time  . furosemide (LASIX) 20 MG tablet Take 20 mg by mouth daily.    02/23/2017 at Unknown time  . gabapentin (NEURONTIN) 400 MG capsule Take 400 mg by mouth 3 (three) times daily.    02/23/2017 at Unknown time  . glipiZIDE-metformin (METAGLIP) 2.5-500 MG tablet Take 1 tablet by mouth daily.    02/23/2017 at Unknown time  . guaiFENesin-dextromethorphan (ROBITUSSIN DM) 100-10 MG/5ML syrup Take 5 mLs by mouth every 4 (four) hours as needed for cough. 118 mL 0 prn at prn  . insulin aspart (NOVOLOG) 100 UNIT/ML injection Inject 3-12 Units into the skin 3 (three) times daily with meals.   Past Week at Unknown time  . Ipratropium-Albuterol (COMBIVENT RESPIMAT) 20-100 MCG/ACT AERS respimat Inhale 1  puff into the lungs 4 (four) times daily.    02/23/2017 at Unknown time  . itraconazole (SPORANOX) 100 MG capsule Take 200 mg by mouth daily.   02/23/2017 at Unknown time  . ketotifen (ZADITOR) 0.025 % ophthalmic solution Place 1 drop into both eyes 2 (two) times daily.    02/23/2017 at Unknown time  . LORazepam (ATIVAN) 0.5 MG tablet Take 1 tablet (0.5 mg total) by mouth 2 (two) times daily. 30 tablet 2 02/23/2017 at Unknown time  . LORazepam (ATIVAN) 0.5 MG tablet Take 1 tablet (0.5 mg total) by mouth every 4 (four) hours as needed. 30 tablet 2 prn at prn  . magnesium oxide (MAG-OX) 400 MG tablet Take 400 mg by mouth 2 (two) times daily.    02/23/2017 at Unknown time  . Melatonin 10 MG TABS Take 10 mg by mouth at bedtime.    02/23/2017 at Unknown time  . Menthol 1 MG LOZG Use as directed 1 lozenge in the mouth or throat every 6 (six) hours as needed. For  irritation/pain   prn at prn  . Multiple Vitamins-Minerals (SENIOR TABS) TABS Take 1 tablet by mouth daily.   02/23/2017 at Unknown time  . nystatin (MYCOSTATIN/NYSTOP) powder Apply 1 application topically to abdominal folds daily for redness and irritation.   prn at prn  . pantoprazole (PROTONIX) 20 MG tablet Take 20 mg by mouth daily.   02/23/2017 at Unknown time  . potassium chloride (K-DUR,KLOR-CON) 10 MEQ tablet Take 10 mEq by mouth every other day.    02/23/2017 at Unknown time  . pravastatin (PRAVACHOL) 10 MG tablet Take 10 mg by mouth daily at 6 PM. On Sunday   02/23/2017 at Unknown time  . predniSONE (DELTASONE) 10 MG tablet 30 mg po daily for 1 day, 20 mg po daily for 1 day, 10 mg po daily for 1 day.   02/23/2017 at Unknown time  . senna-docusate (SENOKOT-S) 8.6-50 MG tablet Take 1 tablet by mouth 2 (two) times daily.    02/23/2017 at Unknown time  . Skin Protectants, Misc. (EUCERIN) cream Apply liberal amount topically to dry areas of skin daily after bath and as needed. Ok to keep at bedside   02/23/2017 at Unknown time  . sucralfate (CARAFATE) 1 G tablet Take 1 g by mouth daily.    02/23/2017 at Unknown time  . trandolapril (MAVIK) 2 MG tablet Take 2 mg by mouth daily.   02/23/2017 at Unknown time  . traZODone (DESYREL) 100 MG tablet Take 1 tablet (100 mg total) by mouth at bedtime.   02/22/2017 at Unknown time    Current medications: Current Facility-Administered Medications  Medication Dose Route Frequency Provider Last Rate Last Dose  . 0.9 %  sodium chloride infusion   Intravenous Continuous Gladstone Lighter, MD      . acetaminophen (TYLENOL) tablet 650 mg  650 mg Oral Q6H PRN Lance Coon, MD   650 mg at 02/26/17 2009   Or  . acetaminophen (TYLENOL) suppository 650 mg  650 mg Rectal Q6H PRN Lance Coon, MD      . aspirin EC tablet 81 mg  81 mg Oral Daily Lance Coon, MD   81 mg at 02/27/17 0936  . ceFEPIme (MAXIPIME) 2 g in sodium chloride 0.9 % 100 mL IVPB  2 g Intravenous Driscilla Moats, MD   Stopped at 02/27/17 0602  . divalproex (DEPAKOTE SPRINKLE) capsule 250 mg  250 mg Oral BID Lance Coon, MD   250 mg at  02/27/17 0936  . DULoxetine (CYMBALTA) DR capsule 60 mg  60 mg Oral Daily Lance Coon, MD   60 mg at 02/27/17 1517  . enoxaparin (LOVENOX) injection 40 mg  40 mg Subcutaneous Q24H Lance Coon, MD   40 mg at 02/26/17 2009  . guaiFENesin (MUCINEX) 12 hr tablet 600 mg  600 mg Oral BID Gladstone Lighter, MD   600 mg at 02/27/17 0936  . ipratropium-albuterol (DUONEB) 0.5-2.5 (3) MG/3ML nebulizer solution 3 mL  3 mL Nebulization TID Gladstone Lighter, MD   3 mL at 02/27/17 0815  . LORazepam (ATIVAN) tablet 0.5 mg  0.5 mg Oral BID Lance Coon, MD   Stopped at 02/27/17 0915  . LORazepam (ATIVAN) tablet 0.5 mg  0.5 mg Oral Q4H PRN Lance Coon, MD   0.5 mg at 02/26/17 1855  . Melatonin TABS 10 mg  10 mg Oral Corwin Levins, MD   10 mg at 02/26/17 2141  . mometasone-formoterol (DULERA) 200-5 MCG/ACT inhaler 2 puff  2 puff Inhalation BID Lance Coon, MD   2 puff at 02/27/17 [phone]  . ondansetron (ZOFRAN) tablet 4 mg  4 mg Oral Q6H PRN Lance Coon, MD       Or  . ondansetron [facility]) injection 4 mg  4 mg Intravenous Q6H PRN Lance Coon, MD      . potassium chloride (K-DUR,KLOR-CON) CR tablet 10 mEq  10 mEq Oral Jeannie Done, MD   10 mEq at 02/26/17 1047  . pravastatin (PRAVACHOL) tablet 10 mg  10 mg Oral q1800 Lance Coon, MD   10 mg at 02/26/17 1853  . [START ON 02/28/2017] predniSONE (DELTASONE) tablet 20 mg  20 mg Oral Q breakfast Lance Coon, MD       Followed by  . [START ON 03/02/2017] predniSONE (DELTASONE) tablet 10 mg  10 mg Oral Q breakfast Lance Coon, MD      . senna-docusate (Senokot-S) tablet 1 tablet  1 tablet Oral BID Lance Coon, MD   1 tablet at 02/27/17 [phone]  . trandolapril (MAVIK) tablet 2 mg  2 mg Oral Daily Lance Coon, MD   2 mg at 02/27/17 0936  . traZODone (DESYREL) tablet 100 mg  100 mg Oral Corwin Levins, MD    100 mg at 02/26/17 2140      Allergies: No Known Allergies    Past Medical History: Past Medical History:  Diagnosis Date  . Anxiety    unspecified  . Arthritis   . Breast cancer ([place]) 2007   Early stage left breast cancer status post partial colectomy, 04/2005 (Dr. Oliva Bustard)  . Chest pain, unspecified   . CHF (congestive heart failure) ([place])   . Chronic headache   . Colon cancer ([place])    status post partial colectomy, diagnosed 2000  . Colon neoplasm   . COPD (chronic obstructive pulmonary disease) ([place])   . Depression    unspecified  . Diabetes mellitus   . Fibrocystic breast   . History of breast cancer   . History of colon cancer   . HTN (hypertension) with goal to be determined   . Hyperlipidemia   . Hyperlipidemia, unspecified   . Hypertension   . Obesity, unspecified   . Sleep apnea    mild to moderate     Past Surgical History: Past Surgical History:  Procedure Laterality Date  . APPENDECTOMY    . BREAST LUMPECTOMY  2007   left  . BUNIONECTOMY Right   . CATARACT EXTRACTION W/ INTRAOCULAR LENS  IMPLANT & ANTERIOR VITRECTOMY, BILATERAL Bilateral   . COLON SURGERY  1993  . COLOSTOMY    . DILATION AND CURETTAGE, DIAGNOSTIC / THERAPEUTIC     Fibroid removal  . INSERT REPLACE REMOVE PACEMAKER     ppm medtronic [id number] Adapta dula chamber rate responsive. 02/21/14  . MASTECTOMY PARTIAL / LUMPECTOMY    . PARTIAL COLECTOMY     Abdominal & Transanal   . TONSILLECTOMY    . TOTAL KNEE ARTHROPLASTY Right 07/03/2013   Right total knee arthroplasty using computer assisted navigation  . TUBAL LIGATION Bilateral 1971   with questionable appendectomy     Family History: Family History  Problem Relation Age of Onset  . Factor V Leiden deficiency Sister   . Colon polyps Sister   . Breast cancer Mother   . Colon cancer Mother   . Alzheimer's disease Mother   . Prostate cancer Father      Social History: Social History   Socioeconomic History  . Marital  status: Divorced    Spouse name: Not on file  . Number of children: 4  . Years of education: 13  . Highest education level: Associate degree: occupational, Hotel manager, or vocational program  Social Needs  . Financial resource strain: Not on file  . Food insecurity - worry: Not on file  . Food insecurity - inability: Not on file  . Transportation needs - medical: Not on file  . Transportation needs - non-medical: Not on file  Occupational History    Comment: retired Theatre manager  Tobacco Use  . Smoking status: Former Smoker    Packs/day: 1.50    Years: 50.00    Pack years: 75.00    Types: Cigarettes    Last attempt to quit: 10/05/2010    Years since quitting: 6.4  . Smokeless tobacco: Never Used  Substance and Sexual Activity  . Alcohol use: No    Alcohol/week: 1.0 oz    Types: 2 Standard drinks or equivalent per week    Comment: Occasionally  . Drug use: Not on file  . Sexual activity: Not on file  Other Topics Concern  . Not on file  Social History Narrative   Admitted to [facility] 02/10/2016   Divorced    4 children   Former smoker   Occasionally drinks alcohol     Review of Systems: Not available Gen:  HEENT:  CV:  Resp:  GI: GU :  MS:  Derm:   Psych: Heme:  Neuro:  Endocrine  Vital Signs: Blood pressure (!) 112/57, pulse 91, temperature 97.8 F (36.6 C), temperature source Oral, resp. rate 20, height 5\' 4"  (1.626 m), weight 97.5 kg (215 lb), SpO2 93 %.   Intake/Output Summary (Last 24 hours) at 02/27/2017 1119 Last data filed at 02/27/2017 0935 Gross per 24 hour  Intake 440 ml  Output 1350 ml  Net -910 ml    Weight trends: Filed Weights   02/23/17 1937 02/26/17 0054  Weight: 97.5 kg (215 lb) 97.5 kg (215 lb)    Physical Exam: General:  Obese, elderly, laying in the bed  HEENT  anicteric, moist oral mucous membranes  Neck:  Short, thick  Lungs:  Mild scattered rhonchi, clear to auscultation  Heart::  Regular  Abdomen:   Soft, obese, nontender  Extremities:  Muscle atrophy noted in legs, no edema  Neurologic:  Somnolent, able to follow few simple commands  Skin:  No acute rashes    External urine collection system  Lab results: Basic Metabolic Panel: Recent Labs  Lab 02/23/17 1938 02/24/17 0154 02/24/17 0344 02/27/17 0748 02/27/17 0929  NA 129*  --  131* 122* 122*  K 4.5  --  4.5 3.7  --   CL 82*  --  83* 80*  --   CO2 39*  --  39* 31  --   GLUCOSE 246*  --  141* 102*  --   BUN 12  --  12 15  --   CREATININE 0.48 0.40* 0.49 0.62  --   CALCIUM 8.7*  --  9.0 8.9  --     Liver Function Tests: Recent Labs  Lab 02/22/17 0455  AST 22  ALT 16  ALKPHOS 38  BILITOT 0.7  PROT 5.2*  ALBUMIN 2.7*   No results for input(s): LIPASE, AMYLASE in the last 168 hours. No results for input(s): AMMONIA in the last 168 hours.  CBC: Recent Labs  Lab 02/22/17 0455  02/24/17 0344 02/27/17 0748  WBC 14.8*   < > 12.4* 7.1  NEUTROABS 11.9*  --   --   --   HGB 10.4*   < > 11.3* 12.0  HCT 31.8*   < > 35.2 35.7  MCV 87.6   < > 87.8 86.4  PLT 380   < > 357 259   < > = values in this interval not displayed.    Cardiac Enzymes: Recent Labs  Lab 02/23/17 1938  TROPONINI <0.03    BNP: Invalid input(s): POCBNP  CBG: No results for input(s): GLUCAP in the last 168 hours.  Microbiology: Recent Results (from the past 720 hour(s))  Blood culture (routine x 2)     Status: None   Collection Time: 02/14/17  7:24 PM  Result Value Ref Range Status   Specimen Description BLOOD RIGHT WRIST  Final   Special Requests   Final    BOTTLES DRAWN AEROBIC AND ANAEROBIC Blood Culture adequate volume   Culture   Final    NO GROWTH 5 DAYS Performed at [facility], [address], [place], [place] [address]    Report Status 02/19/2017 FINAL  Final  Blood culture (routine x 2)     Status: Abnormal   Collection Time: 02/14/17  7:24 PM  Result Value Ref Range Status   Specimen Description    Final    BLOOD BLOOD LEFT HAND Performed at [facility], [place] [address] [place], [place] [address]    Special Requests   Final    BOTTLES DRAWN AEROBIC AND ANAEROBIC Blood Culture adequate volume Performed at [place] [place], [place] [address]    Culture  Setup Time   Final    GRAM POSITIVE COCCI IN BOTH AEROBIC AND ANAEROBIC BOTTLES DAVID BESANTI AT 7616 ON 02/15/17 BY MLK.Marland KitchenMarland KitchenOceans Behavioral Healthcare Of Longview    Culture (A)  Final    STAPHYLOCOCCUS SPECIES (COAGULASE NEGATIVE) THE SIGNIFICANCE OF ISOLATING THIS ORGANISM FROM A SINGLE SET OF BLOOD CULTURES WHEN MULTIPLE SETS ARE DRAWN IS UNCERTAIN. PLEASE NOTIFY THE MICROBIOLOGY DEPARTMENT WITHIN ONE WEEK IF SPECIATION AND SENSITIVITIES ARE REQUIRED. Performed at [facility], [place] [address] [place], [place] [address]    Report Status 02/18/2017 FINAL  Final  Urine culture     Status: Abnormal   Collection Time: 02/14/17  7:24 PM  Result Value Ref Range Status   Specimen Description   Final    URINE, RANDOM Performed at [facility], [address] [place],  [address]  Special Requests   Final    NONE Performed at [facility], [address] [place], [place] [address]    Culture MULTIPLE SPECIES PRESENT, SUGGEST RECOLLECTION (A)  Final   Report Status 02/16/2017 FINAL  Final  Blood Culture ID Panel (Reflexed)     Status: Abnormal   Collection Time: 02/14/17  7:24 PM  Result Value Ref Range Status   Enterococcus species NOT DETECTED NOT DETECTED Final   Vancomycin resistance NOT DETECTED NOT DETECTED Final   Listeria monocytogenes NOT DETECTED NOT DETECTED Final   Staphylococcus species DETECTED (A) NOT DETECTED Final    Comment: CRITICAL RESULT CALLED TO, READ BACK BY AND VERIFIED WITH: DAVID BESANTI 02/15/17 @ 2354  MLK Methicillin (oxacillin) resistant coagulase negative staphylococcus. Possible blood culture contaminant (unless isolated from more than one blood culture draw or  clinical case suggests pathogenicity). No antibiotic treatment is indicated for blood  culture contaminants.    Staphylococcus aureus NOT DETECTED NOT DETECTED Final   Methicillin resistance DETECTED (A) NOT DETECTED Final    Comment: CRITICAL RESULT CALLED TO, READ BACK BY AND VERIFIED WITH: DAVID BESANTI 02/15/17 @ 6962  [place]    Streptococcus species NOT DETECTED NOT DETECTED Final   Streptococcus agalactiae NOT DETECTED NOT DETECTED Final   Streptococcus pneumoniae NOT DETECTED NOT DETECTED Final   Streptococcus pyogenes NOT DETECTED NOT DETECTED Final   Acinetobacter baumannii NOT DETECTED NOT DETECTED Final   Enterobacteriaceae species NOT DETECTED NOT DETECTED Final   Enterobacter cloacae complex NOT DETECTED NOT DETECTED Final   Escherichia coli NOT DETECTED NOT DETECTED Final   Klebsiella oxytoca NOT DETECTED NOT DETECTED Final   Klebsiella pneumoniae NOT DETECTED NOT DETECTED Final   Proteus species NOT DETECTED NOT DETECTED Final   Serratia marcescens NOT DETECTED NOT DETECTED Final   Carbapenem resistance NOT DETECTED NOT DETECTED Final   Haemophilus influenzae NOT DETECTED NOT DETECTED Final   Neisseria meningitidis NOT DETECTED NOT DETECTED Final   Pseudomonas aeruginosa NOT DETECTED NOT DETECTED Final   Candida albicans NOT DETECTED NOT DETECTED Final   Candida glabrata NOT DETECTED NOT DETECTED Final   Candida krusei NOT DETECTED NOT DETECTED Final   Candida parapsilosis NOT DETECTED NOT DETECTED Final   Candida tropicalis NOT DETECTED NOT DETECTED Final    Comment: Performed at [facility], [address] [place], [place] [address]  MRSA PCR Screening     Status: None   Collection Time: 02/14/17 10:21 PM  Result Value Ref Range Status   MRSA by PCR NEGATIVE NEGATIVE Final    Comment:        The GeneXpert MRSA Assay (FDA approved for NASAL specimens only), is one component of a comprehensive MRSA colonization surveillance program. It is not intended to  diagnose MRSA infection nor to guide or monitor treatment for MRSA infections. Performed at [facility], [address] [place], [place] [address]   Culture, blood (routine x 2)     Status: None (Preliminary result)   Collection Time: 02/23/17  7:38 PM  Result Value Ref Range Status   Specimen Description BLOOD BLOOD RIGHT FOREARM  Final   Special Requests   Final    BOTTLES DRAWN AEROBIC AND ANAEROBIC Blood Culture adequate volume   Culture   Final    NO GROWTH 4 DAYS Performed at [facility], [address] [place],  [address]    Report Status PENDING  Incomplete  Culture, blood (routine x 2)     Status:  None (Preliminary result)   Collection Time: 02/23/17  7:38 PM  Result Value Ref Range Status   Specimen Description BLOOD BLOOD LEFT FOREARM  Final   Special Requests   Final    BOTTLES DRAWN AEROBIC AND ANAEROBIC Blood Culture results may not be optimal due to an excessive volume of blood received in culture bottles   Culture   Final    NO GROWTH 4 DAYS Performed at [facility], [address] [place], [place] [address]    Report Status PENDING  Incomplete  Culture, expectorated sputum-assessment     Status: None   Collection Time: 02/24/17 12:10 PM  Result Value Ref Range Status   Specimen Description Expect. Sput  Final   Special Requests NONE  Final   Sputum evaluation   Final    THIS SPECIMEN IS ACCEPTABLE FOR SPUTUM CULTURE Performed at [facility], [address] [place], [place] [address]    Report Status 02/24/2017 FINAL  Final  Culture, respiratory (NON-Expectorated)     Status: None   Collection Time: 02/24/17 12:10 PM  Result Value Ref Range Status   Specimen Description   Final    Expect. Sput Performed at [facility], [address], [place], [place] [address]    Special Requests   Final    NONE Reflexed from [id number] Performed at [facility], [place], [place]  [address]    Gram Stain   Final    RARE WBC PRESENT, PREDOMINANTLY PMN RARE GRAM NEGATIVE COCCOBACILLI RARE SQUAMOUS EPITHELIAL CELLS PRESENT Performed at [facility], [place] [address], [place], [place] [address]    Culture   Final    RARE FUNGUS (MOLD) ISOLATED, PROBABLE CONTAMINANT/COLONIZER (SAPROPHYTE). CONTACT MICROBIOLOGY IF FURTHER IDENTIFICATION REQUIRED [phone].   Report Status 02/27/2017 FINAL  Final  MRSA PCR Screening     Status: None   Collection Time: 02/26/17  3:04 PM  Result Value Ref Range Status   MRSA by PCR NEGATIVE NEGATIVE Final    Comment:        The GeneXpert MRSA Assay (FDA approved for NASAL specimens only), is one component of a comprehensive MRSA colonization surveillance program. It is not intended to diagnose MRSA infection nor to guide or monitor treatment for MRSA infections. Performed at [facility], [address] [place], [place] [address]      Coagulation Studies: No results for input(s): LABPROT, INR in the last 72 hours.  Urinalysis: No results for input(s): COLORURINE, LABSPEC, PHURINE, GLUCOSEU, HGBUR, BILIRUBINUR, KETONESUR, PROTEINUR, UROBILINOGEN, NITRITE, LEUKOCYTESUR in the last 72 hours.  Invalid input(s): APPERANCEUR      Imaging: Dg Chest Port 1 View  Result Date: 02/27/2017 CLINICAL DATA:  Shortness of breath and mental status change EXAM: PORTABLE CHEST 1 VIEW COMPARISON:  February 23, 2017 FINDINGS: Stable pacemaker. The heart, hila, mediastinum, lungs, and pleura are unchanged. Mild atelectasis in the left base. IMPRESSION: No active disease. Electronically Signed   By: Dorise Bullion III M.D   On: 02/27/2017 07:36      Assessment & Plan: Pt is a 83 y.o. Caucasian  female with COPD, Arthritis, h/o breast cancer, colon cancer, diabetes  bed bound status, was admitted on 02/23/2017 with worsening shortness of breath,  Patient is resident of [facility].  1. Hyponatremia Patient appears  to have chronic hyponatremia with her baseline sodium level of 131 from February 24, 2017 This admission, her sodium was 122 Workup so far has included  a 2D echocardiogram which shows EF of 55-60% TSH is normal at 1.448 Review of her home medication shows she was getting Lasix 20 mg daily At present, hyponatremia seems to be related to over diuresis from furosemide  Plan: Agree with holding furosemide Obtain urine sodium, chloride Due to patient's bedbound status, orthostatics are difficult to obtain Patient is noted to have low albumin of 2.7; Consider right upper quadrant ultrasound to rule out cirrhosis Agree with IV hydration with low-dose normal saline Will follow   LOS: [place] 2/24/201911:19 AM  [place], [place]  Note: This note was prepared with Dragon dictation. Any transcription errors are unintentional

## 2017-02-28 ENCOUNTER — Inpatient Hospital Stay: Payer: Medicare Other

## 2017-02-28 LAB — CULTURE, BLOOD (ROUTINE X 2)
CULTURE: NO GROWTH
Culture: NO GROWTH
Special Requests: ADEQUATE

## 2017-02-28 LAB — BASIC METABOLIC PANEL
ANION GAP: 8 (ref 5–15)
BUN: 13 mg/dL (ref 6–20)
CO2: 30 mmol/L (ref 22–32)
Calcium: 8.4 mg/dL — ABNORMAL LOW (ref 8.9–10.3)
Chloride: 87 mmol/L — ABNORMAL LOW (ref 101–111)
Creatinine, Ser: 0.68 mg/dL (ref 0.44–1.00)
GFR calc Af Amer: >60 mL/min (ref >60)
GLUCOSE: 92 mg/dL (ref 65–99)
Potassium: 3.4 mmol/L — ABNORMAL LOW (ref 3.5–5.1)
Sodium: 125 mmol/L — ABNORMAL LOW (ref 135–145)

## 2017-02-28 LAB — SODIUM: Sodium: 125 mmol/L — ABNORMAL LOW (ref 135–145)

## 2017-02-28 MED ORDER — ADULT MULTIVITAMIN W/MINERALS CH
1.0000 | ORAL_TABLET | Freq: Every day | ORAL | Status: DC
  Administered 2017-02-28 – 2017-03-04 (×5): 1 via ORAL
  Filled 2017-02-28 (×6): qty 1

## 2017-02-28 MED ORDER — ENSURE ENLIVE PO LIQD
237.0000 mL | Freq: Two times a day (BID) | ORAL | Status: DC
  Administered 2017-02-28 – 2017-03-04 (×5): 237 mL via ORAL

## 2017-02-28 MED ORDER — SODIUM CHLORIDE 0.9 % IV SOLN
100.0000 mg | Freq: Two times a day (BID) | INTRAVENOUS | Status: DC
  Administered 2017-02-28 – 2017-03-01 (×3): 100 mg via INTRAVENOUS
  Filled 2017-02-28 (×5): qty 100

## 2017-02-28 NOTE — Progress Notes (Signed)
Initial Nutrition Assessment  DOCUMENTATION CODES:   Obesity unspecified  INTERVENTION:   Ensure Enlive po BID, each supplement provides 350 kcal and 20 grams of protein  MVI daily  Dysphagia 2 diet   NUTRITION DIAGNOSIS:   Increased nutrient needs related to cancer and cancer related treatments, other (see comment)(COPD, CHF, DM, HCAP) as evidenced by increased estimated needs from protein.  GOAL:   Patient will meet greater than or equal to 90% of their needs  MONITOR:   PO intake, Supplement acceptance, Weight trends, Labs, I & O's  REASON FOR ASSESSMENT:   LOS    ASSESSMENT:   83 y/o F with COPD on 2l chronic o2, OSA on CPAP, arthritis, h/o breast cancer and colon cancer, DM who is bedbound at baseline, from a [facility] term care presented with shortness of breath and productive cough. Admitted with HCAP   Met with pt in room today. Pt is a poor historian but reports good appetite and oral intake at baseline. Pt eating 75% of meals in hospital. Pt reports that she does drink chocolate Ensure at home. Per chart, pt is weight stable pta. Pt reports difficulty chewing as she does not have her dentures with her here. Pt reports that she eats ground foods at home. RD will order supplements and dysphagia 2 diet. Pt with hypokalemia today; monitor and supplement per MD discretion.   Medications reviewed and include: aspirin, lovenox, melatonin, KCl, prednisone, senokot, NaCl _0 /hr, doxycycline   Labs reviewed: Na 125(L), K 3.4(L), Cl 87(L), Ca 8.4(L)  Nutrition-Focused physical exam completed. Findings are no fat depletion, no muscle depletion, and no edema.   Diet Order:  DIET DYS 2 Room service appropriate? Yes; Fluid consistency: Thin  EDUCATION NEEDS:   Education needs have been addressed  Skin: Reviewed RN Assessment  Last BM:  2/24- type 7  Height:   Ht Readings from Last 1 Encounters:  02/26/17 _1  (1.626 m)    Weight:   Wt Readings from Last 1  Encounters:  02/28/17 208 lb 8.9 oz (94.6 kg)    Ideal Body Weight:  54.5 kg  BMI:  Body mass index is 35.8 kg/m.  Estimated Nutritional Needs:   Kcal:  1700-2000kcal/day   Protein:  95-113g/day   Fluid:  >1.7L/day   Koleen Distance MS, RD, LDN Pager #[phone] After Hours Pager: [phone]

## 2017-02-28 NOTE — Progress Notes (Signed)
Pharmacy Antibiotic Note  Kathryn Hayes is a 83 y.o. female admitted on 02/23/2017 with pneumonia and sepsis.  Pharmacy has been consulted for vancomycin and cefepime dosing.  Plan: Cefepime 2 grams q 8 hours ordered Continue cefepime as ordered.   Height: 5\' 4"  (162.6 cm) Weight: 215 lb (97.5 kg) IBW/kg (Calculated) : 54.7  Temp (24hrs), Avg:98.4 F (36.9 C), Min:98.2 F (36.8 C), Max:98.6 F (37 C)  Recent Labs  Lab 02/22/17 0455 02/23/17 1938 02/24/17 0154 02/24/17 0344 02/25/17 1835 02/27/17 0748 02/28/17 0745  WBC 14.8* 14.4* 12.5* 12.4*  --  7.1  --   CREATININE 0.45 0.48 0.40* 0.49  --  0.62 0.68  VANCOTROUGH  --   --   --   --  18  --   --     Estimated Creatinine Clearance: 63.6 mL/min (by C-G formula based on SCr of 0.68 mg/dL).    No Known Allergies  Antimicrobials this admission: Vancomycin 2/20>>2/23 cefepime 2/20  >>    >>   Dose adjustments this admission:   Microbiology results: 2/20 BCx: NG   Thank you for allowing pharmacy to be a part of this patient's care.  Rocky Morel 02/28/2017 11:14 AM

## 2017-02-28 NOTE — Progress Notes (Signed)
[place] at [place] NAME: Kathryn Hayes    MR#:  [id number]  DATE OF BIRTH:  October 14, 1934  SUBJECTIVE:  CHIEF COMPLAINT:   Chief Complaint  Patient presents with  . Shortness of Breath   -Started on IV fluids, sodium improved up to 125. Still sleepy but arousable and able to converse today  REVIEW OF SYSTEMS:  Review of Systems  Unable to perform ROS: Mental status change    DRUG ALLERGIES:  No Known Allergies  VITALS:  Blood pressure 133/79, pulse (!) 103, temperature 98.6 F (37 C), temperature source Oral, resp. rate 18, height 5\' 4"  (1.626 m), weight 97.5 kg (215 lb), SpO2 99 %.  PHYSICAL EXAMINATION:  Physical Exam  GENERAL:  83 y.o.-year-old patient lying in the bed with no acute distress.  EYES: Pupils equal, round, reactive to light and accommodation. No scleral icterus. Extraocular muscles intact.  HEENT: Head atraumatic, normocephalic. Oropharynx and nasopharynx clear.  NECK:  Supple, no jugular venous distention. No thyroid enlargement, no tenderness.  LUNGS: scattered wheezing, bibasilar rhonchi. No use of accessory muscles of respiration.  CARDIOVASCULAR: S1, S2 normal. No murmurs, rubs, or gallops.  ABDOMEN: Soft, nontender, nondistended. Bowel sounds present. No organomegaly or mass.  EXTREMITIES: No pedal edema, cyanosis, or clubbing.  NEUROLOGIC: Cranial nerves II through XII are intact. Sleepy, not following simple commands.. Sensation intact. Gait not checked.  PSYCHIATRIC: The patient is sleepy, arousable and answering questions. Following commands SKIN: No obvious rash, lesion, or ulcer.    LABORATORY PANEL:   CBC Recent Labs  Lab 02/27/17 0748  WBC 7.1  HGB 12.0  HCT 35.7  PLT 259   ------------------------------------------------------------------------------------------------------------------  Chemistries  Recent Labs  Lab 02/22/17 0455  02/28/17 0745  NA 131*   < > 125*  K 4.8   < > 3.4*   CL 85*   < > 87*  CO2 34*   < > 30  GLUCOSE 94   < > 92  BUN 12   < > 13  CREATININE 0.45   < > 0.68  CALCIUM 8.7*   < > 8.4*  AST 22  --   --   ALT 16  --   --   ALKPHOS 38  --   --   BILITOT 0.7  --   --    < > = values in this interval not displayed.   ------------------------------------------------------------------------------------------------------------------  Cardiac Enzymes Recent Labs  Lab 02/23/17 1938  TROPONINI <0.03   ------------------------------------------------------------------------------------------------------------------  RADIOLOGY:  Dg Chest Port 1 View  Result Date: 02/27/2017 CLINICAL DATA:  Shortness of breath and mental status change EXAM: PORTABLE CHEST 1 VIEW COMPARISON:  February 23, 2017 FINDINGS: Stable pacemaker. The heart, hila, mediastinum, lungs, and pleura are unchanged. Mild atelectasis in the left base. IMPRESSION: No active disease. Electronically Signed   By: Dorise Bullion III M.D   On: 02/27/2017 07:36   US Abdomen Limited Ruq  Result Date: 02/28/2017 CLINICAL DATA:  Cirrhosis EXAM: ULTRASOUND ABDOMEN LIMITED RIGHT UPPER QUADRANT COMPARISON:  CT AP 05/16/2017. FINDINGS: Gallbladder: No gallstones or wall thickening visualized. No sonographic Murphy sign noted by sonographer. Common bile duct: Diameter: 4 mm Liver: Increased parenchymal echogenicity. No focal liver abnormality. Portal vein is patent on color Doppler imaging with normal direction of blood flow towards the liver. IMPRESSION: 1. Echogenic liver compatible with hepatic steatosis. 2. No focal liver abnormality identified. Electronically Signed   By: Kerby Moors M.D.   On:  02/28/2017 09:14    EKG:   Orders placed or performed during the hospital encounter of 02/23/17  . EKG 12-Lead  . EKG 12-Lead    ASSESSMENT AND PLAN:   83 y/o F with COPD on 2l chronic o2, OSA on CPAP, arthritis, h/o breast cancer and colon cancer, DM who is bedbound at baseline, from a [facility]  term care presented with shortness of breath and productive cough.  1. HCAP- CXR with left basilar atelectasis - MRSA pcr is negative - vancomycin discontinued, on cefepime- change to oral antibiotics - on mucinex. Chest PT ordered -incentive spirometry when more alert  2. Acute encephalopathy- secondary to acute hyponatremia -Patient has chronic hyponatremia baseline sodium series between 128 to 130. -ABG  with normal CO2, has metabolic alkalosis. -No focal neurological deficits. Sodium is improving to 125 with gentle IV fluids -Hold Lasix. -Nephrology has been consulted. Repeat sodium check later today and tomorrow -No renal disease, no systolic heart failure at baseline. Right upper upper quadrant ultrasound showed hepatic steatosis. -received a dose of albumin infusion   3. COPD exacerbation- acute on chronic - on oral steroids, nebs and inhalers  4. Chronic diastolic heart failure- chest x-ray with no pulmonary edema. Lasix has been held for now  5. DVT Prophylaxis- lovenox  From [facility] term facility Palliative care follow up in the hospital     All the records are reviewed and case discussed with Care Management/Social Workerr. Management plans discussed with the patient, family and they are in agreement.  CODE STATUS: Full Code  TOTAL TIME TAKING CARE OF THIS PATIENT: 36 minutes.   POSSIBLE D/C IN 2-3 DAYS, DEPENDING ON CLINICAL CONDITION.   Gladstone Lighter M.D on 02/28/2017 at 11:52 AM  Between 7am to 6pm - Pager - [phone]  After 6pm go to [email or web address] - password [email or web address] [facility]  Office  [phone]  CC: Primary care physician; Juluis Pitch, MD

## 2017-02-28 NOTE — Progress Notes (Signed)
Nutrition Brief Note  Received page from RN regarding patient's diet. Patient had been placed on dysphagia 2 diet by RD earlier today as she had reported she eats ground foods at home in setting of difficulty chewing. When son arrived he clarified with RN that patient actually just eats chopped food and not ground. They would like diet changed to dysphagia 3 with thin liquids.   RD will change diet to dysphagia 3 with thin liquids  Continue Ensure Enlive po BID, each supplement provides 350 kcal and 20 grams of protein  Continue daily MVI  Willey Blade, MS, RD, LDN Office: [phone] Pager: [phone] After Hours/Weekend Pager: [phone]

## 2017-03-01 ENCOUNTER — Inpatient Hospital Stay: Payer: Medicare Other

## 2017-03-01 DIAGNOSIS — Z515 Encounter for palliative care: Secondary | ICD-10-CM

## 2017-03-01 DIAGNOSIS — Z7189 Other specified counseling: Secondary | ICD-10-CM

## 2017-03-01 DIAGNOSIS — R05 Cough: Secondary | ICD-10-CM

## 2017-03-01 DIAGNOSIS — I5022 Chronic systolic (congestive) heart failure: Secondary | ICD-10-CM

## 2017-03-01 DIAGNOSIS — J189 Pneumonia, unspecified organism: Secondary | ICD-10-CM

## 2017-03-01 DIAGNOSIS — J441 Chronic obstructive pulmonary disease with (acute) exacerbation: Secondary | ICD-10-CM

## 2017-03-01 LAB — BASIC METABOLIC PANEL
Anion gap: 7 (ref 5–15)
BUN: 11 mg/dL (ref 6–20)
CALCIUM: 8.4 mg/dL — AB (ref 8.9–10.3)
CHLORIDE: 93 mmol/L — AB (ref 101–111)
CO2: 29 mmol/L (ref 22–32)
CREATININE: 0.36 mg/dL — AB (ref 0.44–1.00)
GFR calc non Af Amer: >60 mL/min (ref >60)
GLUCOSE: 107 mg/dL — AB (ref 65–99)
Potassium: 3.8 mmol/L (ref 3.5–5.1)
Sodium: 129 mmol/L — ABNORMAL LOW (ref 135–145)

## 2017-03-01 LAB — BLOOD GAS, ARTERIAL
Acid-Base Excess: 10.2 mmol/L — ABNORMAL HIGH (ref 0.0–2.0)
Allens test (pass/fail): POSITIVE — AB
Bicarbonate: 34.3 mmol/L — ABNORMAL HIGH (ref 20.0–28.0)
FIO2: 0.28
O2 Saturation: 92.7 %
PCO2 ART: 43 mmHg (ref 32.0–48.0)
PH ART: 7.51 — AB (ref 7.350–7.450)
PO2 ART: 59 mmHg — AB (ref 83.0–108.0)
Patient temperature: 37

## 2017-03-01 LAB — AMMONIA

## 2017-03-01 LAB — PROTEIN ELECTROPHORESIS, SERUM
A/G RATIO SPE: 1.2 (ref 0.7–1.7)
Albumin ELP: 3.1 g/dL (ref 2.9–4.4)
Alpha-1-Globulin: 0.2 g/dL (ref 0.0–0.4)
Alpha-2-Globulin: 0.9 g/dL (ref 0.4–1.0)
BETA GLOBULIN: 0.8 g/dL (ref 0.7–1.3)
GAMMA GLOBULIN: 0.5 g/dL (ref 0.4–1.8)
Globulin, Total: 2.5 g/dL (ref 2.2–3.9)
Total Protein ELP: 5.6 g/dL — ABNORMAL LOW (ref 6.0–8.5)

## 2017-03-01 LAB — PROTEIN ELECTRO, RANDOM URINE
ALPHA-1-GLOBULIN, U: 0 %
ALPHA-2-GLOBULIN, U: 0 %
Albumin ELP, Urine: 0 %
Beta Globulin, U: 0 %
GAMMA GLOBULIN, U: 0 %
Total Protein, Urine: 12.3 mg/dL

## 2017-03-01 LAB — SODIUM: Sodium: 128 mmol/L — ABNORMAL LOW (ref 135–145)

## 2017-03-01 MED ORDER — SODIUM CHLORIDE 0.9 % IV SOLN
3.0000 g | Freq: Four times a day (QID) | INTRAVENOUS | Status: DC
  Administered 2017-03-01 – 2017-03-04 (×10): 3 g via INTRAVENOUS
  Filled 2017-03-01 (×14): qty 3

## 2017-03-01 NOTE — Progress Notes (Signed)
Pt was taken off cpap and given a nebulizer treatment and placed on 3 L nasal cannula.

## 2017-03-01 NOTE — Progress Notes (Signed)
   [place] [facility] Day: 6 days Kathryn Hayes is a 83 y.o. female presenting with Shortness of Breath .   Advance care planning discussed with patient  And her daughter Ms. Kathryn Hayes. Patient has multiple chronic diagnoses, bed bound at baseline. Palliative care consult requested for goals of care conversation, however daughter mentioned that patient is full code. Patient is more alert now, but has had fluctuating mental status over the last couple of days. All family members wants to sit and discuss codestatus prior to discharge. All questions in regards to overall condition and expected prognosis answered. The decision was made to continue current code status  CODE STATUS: Full Code Time spent: 18 minutes

## 2017-03-01 NOTE — Progress Notes (Signed)
[place] at [place] NAME: Kathryn Hayes    MR#:  [id number]  DATE OF BIRTH:  17-Apr-1934  SUBJECTIVE:  CHIEF COMPLAINT:   Chief Complaint  Patient presents with  . Shortness of Breath   -Still getting gentle hydration, mental status not improved yet. Very sleepy.  REVIEW OF SYSTEMS:  Review of Systems  Unable to perform ROS: Mental status change    DRUG ALLERGIES:  No Known Allergies  VITALS:  Blood pressure (!) 152/83, pulse 98, temperature 98.3 F (36.8 C), temperature source Oral, resp. rate 20, height 5\' 4"  (1.626 m), weight 94.6 kg (208 lb 8.9 oz), SpO2 99 %.  PHYSICAL EXAMINATION:  Physical Exam  GENERAL:  83 y.o.-year-old patient lying in the bed with no acute distress.  EYES: Pupils equal, round, reactive to light and accommodation. No scleral icterus. Extraocular muscles intact.  HEENT: Head atraumatic, normocephalic. Oropharynx and nasopharynx clear.  NECK:  Supple, no jugular venous distention. No thyroid enlargement, no tenderness.  LUNGS: scattered wheezing, bibasilar rhonchi. No use of accessory muscles of respiration.  CARDIOVASCULAR: S1, S2 normal. No murmurs, rubs, or gallops.  ABDOMEN: Soft, nontender, nondistended. Bowel sounds present. No organomegaly or mass.  EXTREMITIES: No pedal edema, cyanosis, or clubbing.  NEUROLOGIC: Cranial nerves II through XII are intact. Sleepy, not following simple commands.. Sensation intact. Gait not checked.  PSYCHIATRIC: The patient is sleepy, arousable and answering some questions. Following simple commands SKIN: No obvious rash, lesion, or ulcer.    LABORATORY PANEL:   CBC Recent Labs  Lab 02/27/17 0748  WBC 7.1  HGB 12.0  HCT 35.7  PLT 259   ------------------------------------------------------------------------------------------------------------------  Chemistries  Recent Labs  Lab 03/01/17 0337  NA 129*  K 3.8  CL 93*  CO2 29  GLUCOSE 107*  BUN 11    CREATININE 0.36*  CALCIUM 8.4*   ------------------------------------------------------------------------------------------------------------------  Cardiac Enzymes Recent Labs  Lab 02/23/17 1938  TROPONINI <0.03   ------------------------------------------------------------------------------------------------------------------  RADIOLOGY:  US Abdomen Limited Ruq  Result Date: 02/28/2017 CLINICAL DATA:  Cirrhosis EXAM: ULTRASOUND ABDOMEN LIMITED RIGHT UPPER QUADRANT COMPARISON:  CT AP 05/16/2017. FINDINGS: Gallbladder: No gallstones or wall thickening visualized. No sonographic Murphy sign noted by sonographer. Common bile duct: Diameter: 4 mm Liver: Increased parenchymal echogenicity. No focal liver abnormality. Portal vein is patent on color Doppler imaging with normal direction of blood flow towards the liver. IMPRESSION: 1. Echogenic liver compatible with hepatic steatosis. 2. No focal liver abnormality identified. Electronically Signed   By: Kerby Moors M.D.   On: 02/28/2017 09:14    EKG:   Orders placed or performed during the hospital encounter of 02/23/17  . EKG 12-Lead  . EKG 12-Lead    ASSESSMENT AND PLAN:   83 y/o F with COPD on 2l chronic o2, OSA on CPAP, arthritis, h/o breast cancer and colon cancer, DM who is bedbound at baseline, from a [facility] term care presented with shortness of breath and productive cough.  1. HCAP- CXR with left basilar atelectasis - MRSA pcr is negative - Currently on doxycycline, discontinue antibiotics after tomorrow's dose for a total of 7 days - on mucinex. Chest PT ordered -incentive spirometry when more alert  2. Acute encephalopathy- secondary to acute hyponatremia -Patient has chronic hyponatremia baseline sodium series between 128 to 130. -ABG  with normal CO2, has metabolic alkalosis. -No focal neurological deficits.  -Odium is improving to 129 with fluids. Continue fluids for 1 more day.-Hold Lasix. -Nephrology has been  consulted.  -No renal disease, no systolic heart failure at baseline. Right upper upper quadrant ultrasound showed hepatic steatosis.  3. COPD exacerbation- acute on chronic - on oral steroids-will finish steroids by tomorrow, nebs and inhalers  4. Chronic diastolic heart failure- chest x-ray with no pulmonary edema. Lasix has been held for now  5. DVT Prophylaxis- lovenox  From [facility] term facility-from [facility] Palliative care consult today.     All the records are reviewed and case discussed with Care Management/Social Workerr. Management plans discussed with the patient, family and they are in agreement.  CODE STATUS: Full Code  TOTAL TIME TAKING CARE OF THIS PATIENT: 38 minutes.   POSSIBLE D/C IN 1-2 DAYS, DEPENDING ON CLINICAL CONDITION.   Gladstone Lighter M.D on 03/01/2017 at 11:48 AM  Between 7am to 6pm - Pager - [phone]  After 6pm go to [email or web address] - password [email or web address] [facility]  Office  [phone]  CC: Primary care physician; Juluis Pitch, MD

## 2017-03-01 NOTE — Progress Notes (Signed)
[facility]  ROUNDING NOTE   Subjective:   Na 129 IVF NS at 11mL/hr  Objective:  Vital signs in last 24 hours:  Temp:  [98.3 F (36.8 C)-98.7 F (37.1 C)] 98.4 F (36.9 C) (02/26 1352) Pulse Rate:  [85-108] 99 (02/26 1352) Resp:  [18-22] 20 (02/26 1352) BP: (136-155)/(70-83) 155/81 (02/26 1352) SpO2:  [93 %-99 %] 98 % (02/26 1352)  Weight change:  Filed Weights   02/23/17 1937 02/26/17 0054 02/28/17 1359  Weight: 97.5 kg (215 lb) 97.5 kg (215 lb) 94.6 kg (208 lb 8.9 oz)    Intake/Output: I/O last 3 completed shifts: In: 2341 [P.O.:1320; I.V.:1021] Out: 1250 [Urine:1250]   Intake/Output this shift:  No intake/output data recorded.  Physical Exam: General: NAD,   Head: Normocephalic, atraumatic. Moist oral mucosal membranes  Eyes: Anicteric, PERRL  Neck: Supple, trachea midline  Lungs:  Clear to auscultation  Heart: Regular rate and rhythm  Abdomen:  Soft, nontender,   Extremities:  no peripheral edema.  Neurologic: Alert to self  Skin: No lesions       Basic Metabolic Panel: Recent Labs  Lab 02/23/17 1938 02/24/17 0154 02/24/17 0344 02/27/17 0748  02/27/17 1654 02/28/17 0745 02/28/17 1510 02/28/17 1842 03/01/17 0337  NA 129*  --  131* 122*   < > 121* 125* 125* 128* 129*  K 4.5  --  4.5 3.7  --   --  3.4*  --   --  3.8  CL 82*  --  83* 80*  --   --  87*  --   --  93*  CO2 39*  --  39* 31  --   --  30  --   --  29  GLUCOSE 246*  --  141* 102*  --   --  92  --   --  107*  BUN 12  --  12 15  --   --  13  --   --  11  CREATININE 0.48 0.40* 0.49 0.62  --   --  0.68  --   --  0.36*  CALCIUM 8.7*  --  9.0 8.9  --   --  8.4*  --   --  8.4*   < > = values in this interval not displayed.    Liver Function Tests: No results for input(s): AST, ALT, ALKPHOS, BILITOT, PROT, ALBUMIN in the last 168 hours. No results for input(s): LIPASE, AMYLASE in the last 168 hours. Recent Labs  Lab 03/01/17 1216  AMMONIA <9*    CBC: Recent Labs  Lab  02/23/17 1938 02/24/17 0154 02/24/17 0344 02/27/17 0748  WBC 14.4* 12.5* 12.4* 7.1  HGB 11.2* 11.6* 11.3* 12.0  HCT 34.7* 35.9 35.2 35.7  MCV 87.6 87.2 87.8 86.4  PLT 354 354 357 259    Cardiac Enzymes: Recent Labs  Lab 02/23/17 1938  TROPONINI <0.03    BNP: Invalid input(s): POCBNP  CBG: No results for input(s): GLUCAP in the last 168 hours.  Microbiology: Results for orders placed or performed during the hospital encounter of 02/23/17  Culture, blood (routine x 2)     Status: None   Collection Time: 02/23/17  7:38 PM  Result Value Ref Range Status   Specimen Description BLOOD BLOOD RIGHT FOREARM  Final   Special Requests   Final    BOTTLES DRAWN AEROBIC AND ANAEROBIC Blood Culture adequate volume   Culture   Final    NO GROWTH 5 DAYS Performed at [facility]  [facility], [place], [place] [address]    Report Status 02/28/2017 FINAL  Final  Culture, blood (routine x 2)     Status: None   Collection Time: 02/23/17  7:38 PM  Result Value Ref Range Status   Specimen Description BLOOD BLOOD LEFT FOREARM  Final   Special Requests   Final    BOTTLES DRAWN AEROBIC AND ANAEROBIC Blood Culture results may not be optimal due to an excessive volume of blood received in culture bottles   Culture   Final    NO GROWTH 5 DAYS Performed at [facility], [address] [place], [place] [address]    Report Status 02/28/2017 FINAL  Final  Culture, expectorated sputum-assessment     Status: None   Collection Time: 02/24/17 12:10 PM  Result Value Ref Range Status   Specimen Description Expect. Sput  Final   Special Requests NONE  Final   Sputum evaluation   Final    THIS SPECIMEN IS ACCEPTABLE FOR SPUTUM CULTURE Performed at [facility], [address] [place], [place] [address]    Report Status 02/24/2017 FINAL  Final  Culture, respiratory (NON-Expectorated)     Status: None   Collection Time: 02/24/17 12:10 PM  Result Value Ref Range  Status   Specimen Description   Final    Expect. Sput Performed at [facility], [address] [place], [place] [address]    Special Requests   Final    NONE Reflexed from [id number] Performed at [facility], [place], [place] [address]    Gram Stain   Final    RARE WBC PRESENT, PREDOMINANTLY PMN RARE GRAM NEGATIVE COCCOBACILLI RARE SQUAMOUS EPITHELIAL CELLS PRESENT Performed at [facility], [place] [address], [place], [place] [address]    Culture   Final    RARE FUNGUS (MOLD) ISOLATED, PROBABLE CONTAMINANT/COLONIZER (SAPROPHYTE). CONTACT MICROBIOLOGY IF FURTHER IDENTIFICATION REQUIRED [phone].   Report Status 02/27/2017 FINAL  Final  MRSA PCR Screening     Status: None   Collection Time: 02/26/17  3:04 PM  Result Value Ref Range Status   MRSA by PCR NEGATIVE NEGATIVE Final    Comment:        The GeneXpert MRSA Assay (FDA approved for NASAL specimens only), is one component of a comprehensive MRSA colonization surveillance program. It is not intended to diagnose MRSA infection nor to guide or monitor treatment for MRSA infections. Performed at [facility], [address] [place], [place] [address]     Coagulation Studies: No results for input(s): LABPROT, INR in the last 72 hours.  Urinalysis: No results for input(s): COLORURINE, LABSPEC, PHURINE, GLUCOSEU, HGBUR, BILIRUBINUR, KETONESUR, PROTEINUR, UROBILINOGEN, NITRITE, LEUKOCYTESUR in the last 72 hours.  Invalid input(s): APPERANCEUR    Imaging: Ct Head Wo Contrast  Result Date: 03/01/2017 CLINICAL DATA:  83 year old female with altered level of consciousness. Initial encounter. EXAM: CT HEAD WITHOUT CONTRAST TECHNIQUE: Contiguous axial images were obtained from the base of the skull through the vertex without intravenous contrast. COMPARISON:  06/16/2007 CT. FINDINGS: Brain: No intracranial hemorrhage or CT evidence of large acute infarct. Prominent chronic  microvascular changes. Moderate global atrophy. No intracranial mass lesion noted on this unenhanced exam. Vascular: Vascular calcifications. Skull: No acute abnormality Sinuses/Orbits: Mild proptosis unchanged. Prominent opacification right maxillary sinus with air-fluid level. Minimal mucosal thickening left maxillary sinus. Partial opacification sphenoid sinuses bilaterally. Other: Mastoid air cells and middle ear cavities are clear. IMPRESSION: No acute intracranial  abnormality. Prominent chronic microvascular changes. Moderate global atrophy. Paranasal sinus opacification most notable right maxillary sinus. Electronically Signed   By: Genia Del M.D.   On: 03/01/2017 13:36   US Abdomen Limited Ruq  Result Date: 02/28/2017 CLINICAL DATA:  Cirrhosis EXAM: ULTRASOUND ABDOMEN LIMITED RIGHT UPPER QUADRANT COMPARISON:  CT AP 05/16/2017. FINDINGS: Gallbladder: No gallstones or wall thickening visualized. No sonographic Murphy sign noted by sonographer. Common bile duct: Diameter: 4 mm Liver: Increased parenchymal echogenicity. No focal liver abnormality. Portal vein is patent on color Doppler imaging with normal direction of blood flow towards the liver. IMPRESSION: 1. Echogenic liver compatible with hepatic steatosis. 2. No focal liver abnormality identified. Electronically Signed   By: Kerby Moors M.D.   On: 02/28/2017 09:14     Medications:   . sodium chloride 50 mL/hr at 03/01/17 0700  . doxycycline (VIBRAMYCIN) IV Stopped (03/01/17 0031)   . aspirin EC  81 mg Oral Daily  . divalproex  250 mg Oral BID  . DULoxetine  60 mg Oral Daily  . enoxaparin (LOVENOX) injection  40 mg Subcutaneous Q24H  . feeding supplement (ENSURE ENLIVE)  237 mL Oral BID BM  . guaiFENesin  600 mg Oral BID  . ipratropium-albuterol  3 mL Nebulization TID  . LORazepam  0.5 mg Oral BID  . Melatonin  10 mg Oral QHS  . mometasone-formoterol  2 puff Inhalation BID  . multivitamin with minerals  1 tablet Oral Daily  .  nystatin  5 mL Mouth/Throat QID  . nystatin   Topical BID  . potassium chloride  10 mEq Oral QODAY  . pravastatin  10 mg Oral q1800  . predniSONE  20 mg Oral Q breakfast   Followed by  . [START ON 03/02/2017] predniSONE  10 mg Oral Q breakfast  . senna-docusate  1 tablet Oral BID  . trandolapril  2 mg Oral Daily  . traZODone  100 mg Oral QHS   acetaminophen **OR** acetaminophen, LORazepam, ondansetron **OR** ondansetron (ZOFRAN) IV  Assessment/ Plan:  Ms. Kathryn Hayes is a 83 y.o. white female with COPD 2 L O2 [place], Arthritis, h/o breast cancer, colon cancer, diabetes  bed bound status, was admitted on 02/23/2017 with worsening shortness of breath,  Patient is resident of [facility] rehab facility.  1. Hyponatremia: improving with IV fluids. baseline sodium level of 131 from February 24, 2017. Acute hyponatremia from hypovolemia.  - Continue IVF for one more day.   2. Hypertension: blood pressure 155/81. Diastolic congestive heart failure. Echo 2/22 - holding furosemide.    LOS: [place] 2/26/20192:35 PM

## 2017-03-01 NOTE — Progress Notes (Incomplete)
Patient has not eaten breakfast, nor taken morning meds. Difficult to arouse this morning. Will awake briefly and answer questions with a nod and then falls back asleep. Replaced pt cpap to ensure adequate oxygenation while sleeping. Will give pt a little longer to sleep off some of the medications and will try again. Will round on pt every 20-30 mins with help of NT.

## 2017-03-01 NOTE — Consult Note (Addendum)
Consultation Note Date: 03/01/2017   Patient Name: Kathryn Hayes  DOB: 12-01-1934  MRN: [id number]  Age / Sex: 83 y.o., female  PCP: Juluis Pitch, MD Referring Physician: Gladstone Lighter, MD  Reason for Consultation: Establishing goals of care  HPI/Patient Profile:  Patient is a 83 y.o. female with medical problems of COPD, diabetes, history of breast cancer and colon cancer, hypertension, sleep apnea, obesity,  bedbound status, who was admitted to [facility] on 02/23/2017 for evaluation of  increasing shortness of breath.  Clinical Assessment and Goals of Care: In to see patient earlier today and patient was sleeping. Patient is alert and oriented at this time.  [name] has been living in a facility for the past year. She states she moved there because she was not able to cook and clean her home. She states food is important to her and she enjoys eating. She enjoys getting her hair and nails done.   She states she has 1 daughter, and has a living will.   We discussed diagnosis, prognosis, GOC, EOL wishes disposition and options.  She states she does not believe she would want chest compressions, shocks, or a breathing tube. She also would not want to be placed on a breathing machine. She states she would like reasonable care such as medications and IV fluids. She would never want a feeding tube. She states that if she were unable to make decisions her daughter is her Media planner.    Spoke with daughter on phone, her daughter would like to wait until family meeting tomorrow to change her code status and create a MOST form as a DNR status would be new for her. Patient is amenable to remaining full code until tomorrow's meeting.       SUMMARY OF RECOMMENDATIONS   Recommend swallow eval.   Family meeting tomorrow at 2:00.    Patient with Trazadone, Ativan, and Melatonin at night making her lethargic  well into the day, would consider decreasing these medications.   Recommend outpatient palliative to follow.   Code Status/Advance Care Planning:  Full code    Symptom Management:   Per primary team.   Palliative Prophylaxis:   Oral Care    Prognosis:   Unable to determine  Discharge Planning: To Be Determined      Primary Diagnoses: Present on Admission: . HCAP (healthcare-associated pneumonia) . Chronic systolic CHF (congestive heart failure) ([place]) . COPD (chronic obstructive pulmonary disease) ([place]) . GERD (gastroesophageal reflux disease) . HTN (hypertension) . Hyperlipidemia, unspecified . Type 2 diabetes, controlled, with neuropathy ([place]) . Sepsis ([place])   I have reviewed the medical record, interviewed the patient and family, and examined the patient. The following aspects are pertinent.  Past Medical History:  Diagnosis Date  . Anxiety    unspecified  . Arthritis   . Breast cancer ([place]) 2007   Early stage left breast cancer status post partial colectomy, 04/2005 (Dr. Oliva Bustard)  . Chest pain, unspecified   . CHF (congestive heart failure) ([place])   . Chronic  headache   . Colon cancer ([place])    status post partial colectomy, diagnosed 2000  . Colon neoplasm   . COPD (chronic obstructive pulmonary disease) ([place])   . Depression    unspecified  . Diabetes mellitus   . Fibrocystic breast   . History of breast cancer   . History of colon cancer   . HTN (hypertension) with goal to be determined   . Hyperlipidemia   . Hyperlipidemia, unspecified   . Hypertension   . Obesity, unspecified   . Sleep apnea    mild to moderate   Social History   Socioeconomic History  . Marital status: Divorced    Spouse name: None  . Number of children: 4  . Years of education: 10  . Highest education level: Associate degree: occupational, Hotel manager, or vocational program  Social Needs  . Financial resource strain: None  . Food insecurity - worry: None  . Food  insecurity - inability: None  . Transportation needs - medical: None  . Transportation needs - non-medical: None  Occupational History    Comment: retired Theatre manager  Tobacco Use  . Smoking status: Former Smoker    Packs/day: 1.50    Years: 50.00    Pack years: 75.00    Types: Cigarettes    Last attempt to quit: 10/05/2010    Years since quitting: 6.4  . Smokeless tobacco: Never Used  Substance and Sexual Activity  . Alcohol use: No    Alcohol/week: 1.0 oz    Types: 2 Standard drinks or equivalent per week    Comment: Occasionally  . Drug use: None  . Sexual activity: None  Other Topics Concern  . None  Social History Narrative   Admitted to [facility] 02/10/2016   Divorced    4 children   Former smoker   Occasionally drinks alcohol   Family History  Problem Relation Age of Onset  . Factor V Leiden deficiency Sister   . Colon polyps Sister   . Breast cancer Mother   . Colon cancer Mother   . Alzheimer's disease Mother   . Prostate cancer Father    Scheduled Meds: . aspirin EC  81 mg Oral Daily  . divalproex  250 mg Oral BID  . DULoxetine  60 mg Oral Daily  . enoxaparin (LOVENOX) injection  40 mg Subcutaneous Q24H  . feeding supplement (ENSURE ENLIVE)  237 mL Oral BID BM  . guaiFENesin  600 mg Oral BID  . ipratropium-albuterol  3 mL Nebulization TID  . LORazepam  0.5 mg Oral BID  . Melatonin  10 mg Oral QHS  . mometasone-formoterol  2 puff Inhalation BID  . multivitamin with minerals  1 tablet Oral Daily  . nystatin  5 mL Mouth/Throat QID  . nystatin   Topical BID  . potassium chloride  10 mEq Oral QODAY  . pravastatin  10 mg Oral q1800  . predniSONE  20 mg Oral Q breakfast   Followed by  . [START ON 03/02/2017] predniSONE  10 mg Oral Q breakfast  . senna-docusate  1 tablet Oral BID  . trandolapril  2 mg Oral Daily  . traZODone  100 mg Oral QHS   Continuous Infusions: . sodium chloride 50 mL/hr at 03/01/17 0700  . doxycycline  (VIBRAMYCIN) IV Stopped (03/01/17 0031)   PRN Meds:.acetaminophen **OR** acetaminophen, LORazepam, ondansetron **OR** ondansetron (ZOFRAN) IV Medications Prior to Admission:  Prior to Admission medications   Medication Sig Start Date End Date Taking? Authorizing  Provider  acetaminophen (TYLENOL) 325 MG tablet Take 650 mg by mouth every 4 (four) hours as needed for mild pain, moderate pain or fever. Do not exceed 3000 mg in 24hrs    Yes [provider]  albuterol (PROVENTIL) (2.5 MG/3ML) 0.083% nebulizer solution Take 2.5 mg by nebulization every 6 (six) hours as needed for wheezing or shortness of breath.    Yes [provider]  aspirin EC 81 MG tablet Take 81 mg by mouth daily.   Yes [provider]  bisacodyl (DULCOLAX) 5 MG EC tablet Take 2 tablets (10 mg total) by mouth daily as needed for moderate constipation. 02/18/17  Yes Demetrios Loll, MD  budesonide (PULMICORT) 0.5 MG/2ML nebulizer solution Take 0.5 mg by nebulization 2 (two) times daily.   Yes [provider]  Cholecalciferol 2000 units CAPS Take 2,000 Units by mouth daily.   Yes [provider]  divalproex (DEPAKOTE SPRINKLE) 125 MG capsule Take 250 mg by mouth 2 (two) times daily.    Yes [provider]  docusate sodium (COLACE) 100 MG capsule Take 100 mg by mouth daily.    Yes [provider]  DULoxetine (CYMBALTA) 60 MG capsule Take 60 mg by mouth daily.    Yes [provider]  fluticasone-salmeterol (ADVAIR HFA) 115-21 MCG/ACT inhaler Inhale 2 puffs into the lungs 2 (two) times daily. 8 am and 8 pm   Yes [provider]  furosemide (LASIX) 20 MG tablet Take 20 mg by mouth daily.    Yes [provider]  gabapentin (NEURONTIN) 400 MG capsule Take 400 mg by mouth 3 (three) times daily.    Yes [provider]  glipiZIDE-metformin (METAGLIP) 2.5-500 MG tablet Take 1 tablet by mouth daily.    Yes [provider]    guaiFENesin-dextromethorphan (ROBITUSSIN DM) 100-10 MG/5ML syrup Take 5 mLs by mouth every 4 (four) hours as needed for cough. 02/18/17  Yes Demetrios Loll, MD  insulin aspart (NOVOLOG) 100 UNIT/ML injection Inject 3-12 Units into the skin 3 (three) times daily with meals.   Yes [provider]  Ipratropium-Albuterol (COMBIVENT RESPIMAT) 20-100 MCG/ACT AERS respimat Inhale 1 puff into the lungs 4 (four) times daily.    Yes [provider]  itraconazole (SPORANOX) 100 MG capsule Take 200 mg by mouth daily.   Yes [provider]  ketotifen (ZADITOR) 0.025 % ophthalmic solution Place 1 drop into both eyes 2 (two) times daily.    Yes [provider]  LORazepam (ATIVAN) 0.5 MG tablet Take 1 tablet (0.5 mg total) by mouth 2 (two) times daily. 02/22/17  Yes Toni Arthurs, NP  LORazepam (ATIVAN) 0.5 MG tablet Take 1 tablet (0.5 mg total) by mouth every 4 (four) hours as needed. 02/22/17  Yes Toni Arthurs, NP  magnesium oxide (MAG-OX) 400 MG tablet Take 400 mg by mouth 2 (two) times daily.    Yes [provider]  Melatonin 10 MG TABS Take 10 mg by mouth at bedtime.    Yes [provider]  Menthol 1 MG LOZG Use as directed 1 lozenge in the mouth or throat every 6 (six) hours as needed. For irritation/pain   Yes [provider]  Multiple Vitamins-Minerals (SENIOR TABS) TABS Take 1 tablet by mouth daily.   Yes [provider]  nystatin (MYCOSTATIN/NYSTOP) powder Apply 1 application topically to abdominal folds daily for redness and irritation.   Yes [provider]  pantoprazole (PROTONIX) 20 MG tablet Take 20 mg by mouth daily.  Yes [provider]  potassium chloride (K-DUR,KLOR-CON) 10 MEQ tablet Take 10 mEq by mouth every other day.    Yes [provider]  pravastatin (PRAVACHOL) 10 MG tablet Take 10 mg by mouth daily at 6 PM. On Sunday   Yes [provider]  predniSONE (DELTASONE) 10 MG tablet 30 mg po  daily for 1 day, 20 mg po daily for 1 day, 10 mg po daily for 1 day. 02/18/17  Yes Demetrios Loll, MD  senna-docusate (SENOKOT-S) 8.6-50 MG tablet Take 1 tablet by mouth 2 (two) times daily.    Yes [provider]  Skin Protectants, Misc. (EUCERIN) cream Apply liberal amount topically to dry areas of skin daily after bath and as needed. Ok to keep at bedside   Yes [provider]  sucralfate (CARAFATE) 1 G tablet Take 1 g by mouth daily.    Yes [provider]  trandolapril (MAVIK) 2 MG tablet Take 2 mg by mouth daily.   Yes [provider]  traZODone (DESYREL) 100 MG tablet Take 1 tablet (100 mg total) by mouth at bedtime. 08/25/15  Yes Hillary Bow, MD   No Known Allergies Review of Systems  Respiratory: Positive for cough and wheezing.   Psychiatric/Behavioral: Sleep disturbance:      Physical Exam  Constitutional: No distress.  Pulmonary/Chest: Effort normal.  Neurological: She is alert.  Oriented    Vital Signs: BP (!) 155/81 (BP Location: Right Arm)   Pulse 99   Temp 98.4 F (36.9 C) (Oral)   Resp 20   Ht 5\' 4"  (1.626 m)   Wt 94.6 kg (208 lb 8.9 oz)   SpO2 98%   BMI 35.80 kg/m  Pain Assessment: 0-10   Pain Score: Asleep   SpO2: SpO2: 98 % O2 Device:SpO2: 98 % O2 Flow Rate: .O2 Flow Rate (L/min): 3 L/min  IO: Intake/output summary:   Intake/Output Summary (Last 24 hours) at 03/01/2017 1556 Last data filed at 03/01/2017 1036 Gross per 24 hour  Intake 840 ml  Output 200 ml  Net 640 ml    LBM: Last BM Date: 02/28/17 Baseline Weight: Weight: 97.5 kg (215 lb) Most recent weight: Weight: 94.6 kg (208 lb 8.9 oz)     Palliative Assessment/Data: 30%     Time In: 3:25 Time Out: 4:15 Time Total: 50 min Greater than 50%  of this time was spent counseling and coordinating care related to the above assessment and plan.  Signed by: Asencion Gowda, NP   Please contact Palliative Medicine Team phone at [phone] for questions and  concerns.  For individual provider: See Shea Evans

## 2017-03-01 NOTE — Plan of Care (Addendum)
Patient sleepy, awakens to touch and voice briefly and goes back to sleep. Will reattempt visit at another time.

## 2017-03-01 NOTE — Progress Notes (Signed)
Daughter requested to cancel the palliative care mtg tomorrow at 1pM as she needs to get all her brothers together- agreeable for prior to discharge mtg but not tomorrow.

## 2017-03-01 NOTE — Progress Notes (Signed)
Pharmacy Antibiotic Note  Kathryn Hayes is a 83 y.o. female admitted on 02/23/2017 with aspiration pneumonia and sepsis.  Pharmacy has been consulted for Unasyn dosing.  Plan: Will start Unasyn 3g Q6H. Pharmacy will continue to follow.   Height: 5\' 4"  (162.6 cm) Weight: 208 lb 8.9 oz (94.6 kg) IBW/kg (Calculated) : 54.7  Temp (24hrs), Avg:98.5 F (36.9 C), Min:98.3 F (36.8 C), Max:98.7 F (37.1 C)  Recent Labs  Lab 02/23/17 1938 02/24/17 0154 02/24/17 0344 02/25/17 1835 02/27/17 0748 02/28/17 0745 03/01/17 0337  WBC 14.4* 12.5* 12.4*  --  7.1  --   --   CREATININE 0.48 0.40* 0.49  --  0.62 0.68 0.36*  VANCOTROUGH  --   --   --  18  --   --   --     Estimated Creatinine Clearance: 62.6 mL/min (A) (by C-G formula based on SCr of 0.36 mg/dL (L)).    No Known Allergies  Antimicrobials this admission: Vancomycin 2/20>>2/23 cefepime 2/20  >> 2/25  Doxycycline 2/25 >> 2/26  Dose adjustments this admission:   Microbiology results: 2/20 BCx: NGTD 2/24 Resp CX: rare gram neg coccobacilli 2/23 MRSA PCR: NEG   Thank you for allowing pharmacy to be a part of this patient's care.  Lendon Ka, PharmD Pharmacy Resident 03/01/2017 5:40 PM

## 2017-03-02 ENCOUNTER — Inpatient Hospital Stay: Payer: Medicare Other

## 2017-03-02 LAB — BASIC METABOLIC PANEL
ANION GAP: 7 (ref 5–15)
BUN: 8 mg/dL (ref 6–20)
CO2: 30 mmol/L (ref 22–32)
Calcium: 8.5 mg/dL — ABNORMAL LOW (ref 8.9–10.3)
Chloride: 90 mmol/L — ABNORMAL LOW (ref 101–111)
Creatinine, Ser: 0.32 mg/dL — ABNORMAL LOW (ref 0.44–1.00)
GFR calc Af Amer: >60 mL/min (ref >60)
GLUCOSE: 160 mg/dL — AB (ref 65–99)
POTASSIUM: 4.3 mmol/L (ref 3.5–5.1)
Sodium: 127 mmol/L — ABNORMAL LOW (ref 135–145)

## 2017-03-02 MED ORDER — IOPAMIDOL (ISOVUE-300) INJECTION 61%
75.0000 mL | Freq: Once | INTRAVENOUS | Status: AC | PRN
  Administered 2017-03-02: 75 mL via INTRAVENOUS

## 2017-03-02 MED ORDER — TRAZODONE HCL 100 MG PO TABS
100.0000 mg | ORAL_TABLET | Freq: Every day | ORAL | Status: DC
  Administered 2017-03-02 – 2017-03-03 (×2): 100 mg via ORAL
  Filled 2017-03-02 (×2): qty 1

## 2017-03-02 NOTE — Progress Notes (Addendum)
Daily Progress Note   Patient Name: Kathryn Hayes       Date: 03/02/2017 DOB: 11/26/1934  Age: 83 y.o. MRN#: [id number] Attending Physician: Gladstone Lighter, MD Primary Care Physician: Juluis Pitch, MD Admit Date: 02/23/2017  Reason for Consultation/Follow-up: Establishing goals of care  Subjective: Patient resting in bed. Her son and daughter are at bedside. We discussed her swallow results which were just completed by SLP and concerns for coughing and pulmonary toileting. Upon discussing the swallow test the patient stated she did not want any more testing, stating she wants to just rest. Her family believes she has not been resting well and is tired. The patient was asked by her daughter if she wanted to continue her current care and she states she does. Her children state her breathing is normal for her and they feel she is comfortable. They would like to continue current level of care. The patient does not wish to discuss code status.     Length of Stay: 7  Current Medications: Scheduled Meds:  . aspirin EC  81 mg Oral Daily  . divalproex  250 mg Oral BID  . DULoxetine  60 mg Oral Daily  . enoxaparin (LOVENOX) injection  40 mg Subcutaneous Q24H  . feeding supplement (ENSURE ENLIVE)  237 mL Oral BID BM  . guaiFENesin  600 mg Oral BID  . ipratropium-albuterol  3 mL Nebulization TID  . LORazepam  0.5 mg Oral BID  . Melatonin  10 mg Oral QHS  . mometasone-formoterol  2 puff Inhalation BID  . multivitamin with minerals  1 tablet Oral Daily  . nystatin  5 mL Mouth/Throat QID  . nystatin   Topical BID  . potassium chloride  10 mEq Oral QODAY  . pravastatin  10 mg Oral q1800  . senna-docusate  1 tablet Oral BID  . trandolapril  2 mg Oral Daily  . traZODone  100 mg Oral QHS     Continuous Infusions: . ampicillin-sulbactam (UNASYN) IV Stopped (03/02/17 1153)    PRN Meds: acetaminophen **OR** acetaminophen, LORazepam, ondansetron **OR** ondansetron (ZOFRAN) IV  Physical Exam  Constitutional: She appears well-developed and well-nourished.  Pulmonary/Chest:  Appears a bit dyspnic, but denies SOB.   Neurological: She is alert.            Vital Signs: BP [place] Kitchen)  168/92 (BP Location: Left Arm)   Pulse (!) 119   Temp 98.7 F (37.1 C) (Oral)   Resp (!) 24   Ht 5\' 4"  (1.626 m)   Wt 94.6 kg (208 lb 8.9 oz)   SpO2 100%   BMI 35.80 kg/m  SpO2: SpO2: 100 % O2 Device: O2 Device: Nasal Cannula O2 Flow Rate: O2 Flow Rate (L/min): 3 L/min  Intake/output summary:   Intake/Output Summary (Last 24 hours) at 03/02/2017 1630 Last data filed at 03/02/2017 1500 Gross per 24 hour  Intake 960 ml  Output 3106 ml  Net -2146 ml   LBM: Last BM Date: 03/01/17 Baseline Weight: Weight: 97.5 kg (215 lb) Most recent weight: Weight: 94.6 kg (208 lb 8.9 oz)       Palliative Assessment/Data: 40%      Patient Active Problem List   Diagnosis Date Noted  . HCAP (healthcare-associated pneumonia) 02/23/2017  . Sepsis ([place]) 02/23/2017  . Acute respiratory failure with hypoxia ([place]) 02/14/2017  . Dependence on supplemental oxygen 03/25/2016  . Primary central sleep apnea 03/25/2016  . Dependence on other enabling machines and devices 03/25/2016  . Presence of cardiac pacemaker 03/25/2016  . Bradycardia, sinus 03/25/2016  . Second degree AV block 03/25/2016  . Major depression, single episode 03/25/2016  . Anxiety disorder 03/25/2016  . Vitamin D deficiency 03/25/2016  . Insomnia 03/25/2016  . GERD (gastroesophageal reflux disease) 03/25/2016  . Syndrome of inappropriate antidiuretic hormone ([place]) 03/25/2016  . Migraine without status migrainosus, not intractable 03/25/2016  . Personal history of other malignant neoplasm of large intestine 03/25/2016  . Personal history  of malignant neoplasm of breast 03/25/2016  . HTN (hypertension) 12/16/2015  . Chronic systolic CHF (congestive heart failure) ([place]) 08/16/2015  . 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 Barrett's esophagus 01/05/2012  . Hypoxemic respiratory failure, chronic ([place]) 03/10/2011  . COPD (chronic obstructive pulmonary disease) ([place])   . Hypertensive heart disease with CHF (congestive heart failure) ([place])   . Hyperlipidemia, unspecified   . Type 2 diabetes, controlled, with neuropathy [facility])     Palliative Care Assessment & Plan   Patient Profile: Patient is a 83 y.o.femalewith medical problems of COPD, diabetes, history of breast cancer and colon cancer, hypertension, sleep apnea, obesity, bedbound status, who was admitted to [facility] on 2/20/2019for evaluation of increasing shortness of breath.   Assessment/Recommendations/Plan:  Patient and family would like to continue current level of care.     Code Status:    Code Status Orders  (From admission, onward)        Start     Ordered   02/24/17 0053  Full code  Continuous     02/24/17 0052    Code Status History    Date Active Date Inactive Code Status Order ID Comments User Context   02/14/2017 22:27 02/18/2017 19:51 Full Code [id number]  Demetrios Loll, MD Inpatient   09/21/2015 19:12 09/25/2015 21:00 Full Code [id number]  Vaughan Basta, MD Inpatient   08/15/2015 15:55 08/16/2015 09:10 Full Code [id number]  Hugelmeyer, Ubaldo Glassing, DO Inpatient       Prognosis:  Poor given COPD, recurrent PNA.   Discharge Planning:  To Be Determined    Thank you for allowing the Palliative Medicine Team to assist in the care of this patient.   Time In: 2:05 Time Out: 2:55 Total Time 50 min Prolonged Time Billed  Yes      Greater than 50%  of this time was spent counseling and coordinating care related to the above  assessment and plan.  Asencion Gowda, NP   Discussed continued coughing following water with SLP.  Please contact  Palliative Medicine Team phone at [phone] for questions and concerns.

## 2017-03-02 NOTE — Progress Notes (Signed)
Patient wore BiPap approx 25 minutes and then wanted it removed. Patient is back on her 3l at this time.

## 2017-03-02 NOTE — Progress Notes (Signed)
[facility] NAME: Kathryn Hayes    MR#:  [id number]  DATE OF BIRTH:  December 06, 1934  SUBJECTIVE:  CHIEF COMPLAINT:   Chief Complaint  Patient presents with  . Shortness of Breath   -more alert today, but falling back to sleep - Did not get her trazodone last night, agitated and restless all night [place]. Refused to wear her CPAP -Sodium starting to trend down again  REVIEW OF SYSTEMS:  Review of Systems  Constitutional: Negative for chills and fever.  HENT: Positive for hearing loss. Negative for congestion, ear discharge and nosebleeds.   Respiratory: Positive for cough and shortness of breath. Negative for wheezing.   Cardiovascular: Negative for chest pain and palpitations.  Gastrointestinal: Negative for abdominal pain, constipation, diarrhea, nausea and vomiting.  Genitourinary: Negative for dysuria.  Musculoskeletal: Positive for myalgias.  Neurological: Negative for dizziness, seizures and headaches.  Psychiatric/Behavioral: The patient is nervous/anxious.     DRUG ALLERGIES:  No Known Allergies  VITALS:  Blood pressure (!) 166/84, pulse (!) 109, temperature 97.7 F (36.5 C), temperature source Oral, resp. rate 20, height 5\' 4"  (1.626 m), weight 94.6 kg (208 lb 8.9 oz), SpO2 93 %.  PHYSICAL EXAMINATION:  Physical Exam  GENERAL:  83 y.o.-year-old patient lying in the bed with no acute distress.  EYES: Pupils equal, round, reactive to light and accommodation. No scleral icterus. Extraocular muscles intact.  HEENT: Head atraumatic, normocephalic. Oropharynx and nasopharynx clear.  NECK:  Supple, no jugular venous distention. No thyroid enlargement, no tenderness.  LUNGS: scattered wheezing, bibasilar rhonchi. No use of accessory muscles of respiration. Decreased bibasilar breath sounds CARDIOVASCULAR: S1, S2 normal. No murmurs, rubs, or gallops.  ABDOMEN: Soft, nontender, nondistended. Bowel sounds present. No  organomegaly or mass.  EXTREMITIES: No pedal edema, cyanosis, or clubbing.  NEUROLOGIC: Cranial nerves II through XII are intact. Sleepy, not following simple commands.. Sensation intact. Gait not checked. Global weakness noted PSYCHIATRIC: The patient is sleepy, arousable and answering some questions. Following simple commands SKIN: No obvious rash, lesion, or ulcer.    LABORATORY PANEL:   CBC Recent Labs  Lab 02/27/17 0748  WBC 7.1  HGB 12.0  HCT 35.7  PLT 259   ------------------------------------------------------------------------------------------------------------------  Chemistries  Recent Labs  Lab 03/02/17 0451  NA 127*  K 4.3  CL 90*  CO2 30  GLUCOSE 160*  BUN 8  CREATININE 0.32*  CALCIUM 8.5*   ------------------------------------------------------------------------------------------------------------------  Cardiac Enzymes Recent Labs  Lab 02/23/17 1938  TROPONINI <0.03   ------------------------------------------------------------------------------------------------------------------  RADIOLOGY:  Ct Head Wo Contrast  Result Date: 03/01/2017 CLINICAL DATA:  83 year old female with altered level of consciousness. Initial encounter. EXAM: CT HEAD WITHOUT CONTRAST TECHNIQUE: Contiguous axial images were obtained from the base of the skull through the vertex without intravenous contrast. COMPARISON:  06/16/2007 CT. FINDINGS: Brain: No intracranial hemorrhage or CT evidence of large acute infarct. Prominent chronic microvascular changes. Moderate global atrophy. No intracranial mass lesion noted on this unenhanced exam. Vascular: Vascular calcifications. Skull: No acute abnormality Sinuses/Orbits: Mild proptosis unchanged. Prominent opacification right maxillary sinus with air-fluid level. Minimal mucosal thickening left maxillary sinus. Partial opacification sphenoid sinuses bilaterally. Other: Mastoid air cells and middle ear cavities are clear. IMPRESSION: No  acute intracranial abnormality. Prominent chronic microvascular changes. Moderate global atrophy. Paranasal sinus opacification most notable right maxillary sinus. Electronically Signed   By: Genia Del M.D.   On: 03/01/2017 13:36    EKG:   Orders placed or  performed during the hospital encounter of 02/23/17  . EKG 12-Lead  . EKG 12-Lead    ASSESSMENT AND PLAN:   83 y/o F with COPD on 2l chronic o2, OSA on CPAP, arthritis, h/o breast cancer and colon cancer, DM who is bedbound at baseline, from a [facility] term care presented with shortness of breath and productive cough.  1. HCAP- CXR with left basilar atelectasis - MRSA pcr is negative - Concern for aspiration yesterday. Speech therapy consulted and started on Unasyn - on mucinex. Chest PT ordered -incentive spirometry when more alert  2. Acute encephalopathy- secondary to acute hyponatremia and also toxic metabolic from medications -Patient has chronic hyponatremia baseline sodium series between 128 to 130. -ABG  with normal CO2, has metabolic alkalosis. -No focal neurological deficits. CT of the head with moderate global atrophy but no acute findings. -A6 discontinued, currently on gentle hydration. Sodium levels fluctuating. Currently at 127 -CT chest ordered due to persistent hyponatremia -Nephrology has been consulted.  -No renal disease, no systolic heart failure at baseline. Right upper upper quadrant ultrasound showed hepatic steatosis.  3. COPD exacerbation- acute on chronic - Finished oral steroids, nebs and inhalers  4. Chronic diastolic heart failure- chest x-ray with no pulmonary edema. Lasix has been held for now  5. DVT Prophylaxis- lovenox  From [facility] term facility-from [facility] Palliative care consulted     All the records are reviewed and case discussed with Care Management/Social Workerr. Management plans discussed with the patient, family and they are in agreement.  CODE STATUS: Full Code  TOTAL TIME  TAKING CARE OF THIS PATIENT: 38 minutes.   POSSIBLE D/C IN 2-3 DAYS, DEPENDING ON CLINICAL CONDITION.   Gladstone Lighter M.D on 03/02/2017 at 11:57 AM  Between 7am to 6pm - Pager - [phone]  After 6pm go to [email or web address] - password [email or web address] [facility]  Office  [phone]  CC: Primary care physician; Juluis Pitch, MD

## 2017-03-02 NOTE — Evaluation (Signed)
Clinical/Bedside Swallow Evaluation Patient Details  Name: Kathryn Hayes MRN: [id number] Date of Birth: 1934-07-05  Today's Date: 03/02/2017 Time: SLP Start Time (ACUTE ONLY): 68 SLP Stop Time (ACUTE ONLY): 1410 SLP Time Calculation (min) (ACUTE ONLY): 55 min  Past Medical History:  Past Medical History:  Diagnosis Date  . Anxiety    unspecified  . Arthritis   . Breast cancer ([place]) 2007   Early stage left breast cancer status post partial colectomy, 04/2005 (Dr. Oliva Bustard)  . Chest pain, unspecified   . CHF (congestive heart failure) ([place])   . Chronic headache   . Colon cancer ([place])    status post partial colectomy, diagnosed 2000  . Colon neoplasm   . COPD (chronic obstructive pulmonary disease) ([place])   . Depression    unspecified  . Diabetes mellitus   . Fibrocystic breast   . History of breast cancer   . History of colon cancer   . HTN (hypertension) with goal to be determined   . Hyperlipidemia   . Hyperlipidemia, unspecified   . Hypertension   . Obesity, unspecified   . Sleep apnea    mild to moderate   Past Surgical History:  Past Surgical History:  Procedure Laterality Date  . APPENDECTOMY    . BREAST LUMPECTOMY  2007   left  . BUNIONECTOMY Right   . CATARACT EXTRACTION W/ INTRAOCULAR LENS IMPLANT & ANTERIOR VITRECTOMY, BILATERAL Bilateral   . COLON SURGERY  1993  . COLOSTOMY    . DILATION AND CURETTAGE, DIAGNOSTIC / THERAPEUTIC     Fibroid removal  . INSERT REPLACE REMOVE PACEMAKER     ppm medtronic [id number] Adapta dula chamber rate responsive. 02/21/14  . MASTECTOMY PARTIAL / LUMPECTOMY    . PARTIAL COLECTOMY     Abdominal & Transanal   . TONSILLECTOMY    . TOTAL KNEE ARTHROPLASTY Right 07/03/2013   Right total knee arthroplasty using computer assisted navigation  . TUBAL LIGATION Bilateral 1971   with questionable appendectomy   HPI:  Pt is a 83 y.o. female who presents after recent discharge from the hospital with persistent symptoms including  shortness of breath, intermittent confusion, malaise and general weakness.  Patient meets sepsis criteria here with fever and tachycardia as well as elevated white blood cell count.  Of note, she has been on p.o. steroids since discharge which could have contributed to her white count.  However, she has clinical symptoms of persistent or recurrent pneumonia, including discolored sputum production and shortness of breath.  Chest x-ray did not show definitive pneumonia; Chest CT 03/02/2017 revealed Enlargement of pulmonary arteries is noted suggesting pulmonary artery hypertension, bilateral pleural effusions are noted with adjacent subsegmental atelectasis, and coronary artery calcifications.    Assessment / Plan / Recommendation Clinical Impression  Pt appears to present w/ adequate oropharyngeal phase swallow function w/ no gross oropharyngeal phase dysphagia noted w/ trials at BSE  - pt is edentulous when eating/drinking thus there is an impact on her ability to fully masticate increased textured foods. Pt is also hampered by her baseline, declined Respiratory status and Bedbound status at baseline. With the impact of respiratory and sitting restrictions, there is increased risk for aspiration to occur.  Following general aspiration precautions w/ po trials, pt was able to feed self w/ setup, support, and positioning. Pt consumed trials of thin liquids via straw(baseline), purees, and soft solids w/ min slower oral phase noted for complete oral mastication and clearing w/ increased textured foods; no deficits noted w/  liquids and purees. No immediate, overt s/s of aspiration noted w/ trials as well; clear vocal quality noted b/t trials. Did note min increased respiratory effort as trials continued so rest breaks were given in order to lessen SOB/WOB. Recommend a Mech Soft consistency diet for easier self-feeding and oral phase management; Thin liquids. Recommend general aspiration precautions including  Supervision at meals and frequent Rest Breaks d/t the exertion during meals and impact on respirations. Recommend pt get out of bed for at least one meal daily for more upright, safe positioning for eating. Education and instruction given to pt and Son in room on general aspiration precautions, strategies. Son agreed. No further skilled ST services indicated at this time; NSG to reconsult if any decline in status while admitted. NSG updated SLP Visit Diagnosis: Dysphagia, oropharyngeal phase (R13.12)    Aspiration Risk  Mild aspiration risk(d/t positioning in bed; respiratory status)    Diet Recommendation  Mech Soft (foods cooked, moist and cut small) diet w/ Thin liquids; general aspiration precautions and support at all meals - pt should try to get out of bed for at least one meal daily.  Rest breaks frequently to lessen fatigue from exertion, SOB/WOB.  Medication Administration: Whole meds with puree(for easier, safer swallowing)    Other  Recommendations Recommended Consults: (Dietician f/u; Palliative Care following) Oral Care Recommendations: Oral care BID;Patient independent with oral care;Staff/trained caregiver to provide oral care   Follow up Recommendations None(at this time)      Frequency and Duration (n/a)  (n/a)       Prognosis Prognosis for Safe Diet Advancement: Fair Barriers to Reach Goals: Severity of deficits(Medical issues)      Swallow Study   General Date of Onset: 02/23/17 HPI: Pt is a 83 y.o. female who presents after recent discharge from the hospital with persistent symptoms including shortness of breath, intermittent confusion, malaise and general weakness.  Patient meets sepsis criteria here with fever and tachycardia as well as elevated white blood cell count.  Of note, she has been on p.o. steroids since discharge which could have contributed to her white count.  However, she has clinical symptoms of persistent or recurrent pneumonia, including discolored  sputum production and shortness of breath.  Chest x-ray did not show definitive pneumonia; Chest CT 03/02/2017 revealed Enlargement of pulmonary arteries is noted suggesting pulmonary artery hypertension, bilateral pleural effusions are noted with adjacent subsegmental atelectasis, and coronary artery calcifications.  Type of Study: Bedside Swallow Evaluation Previous Swallow Assessment: none Diet Prior to this Study: Dysphagia 3 (soft);Thin liquids Temperature Spikes Noted: No(wbc 7.1) Respiratory Status: Nasal cannula(3 liters) History of Recent Intubation: No Behavior/Cognition: Alert;Cooperative;Pleasant mood;Requires cueing;Distractible Oral Cavity Assessment: Within Functional Limits Oral Care Completed by SLP: Recent completion by staff Oral Cavity - Dentition: Edentulous Vision: Functional for self-feeding Self-Feeding Abilities: Able to feed self;Needs assist;Needs set up Patient Positioning: Upright in bed Baseline Vocal Quality: Low vocal intensity Volitional Cough: Strong Volitional Swallow: Able to elicit    Oral/Motor/Sensory Function Overall Oral Motor/Sensory Function: Within functional limits   Ice Chips Ice chips: Not tested   Thin Liquid Thin Liquid: Within functional limits Presentation: Self Fed;Straw(3-4 trials of multiple sips)    Nectar Thick Nectar Thick Liquid: Not tested   Honey Thick Honey Thick Liquid: Not tested   Puree Puree: Within functional limits Presentation: Spoon(fed; 4 trials)   Solid   GO   Solid: Impaired(mech soft trials) Presentation: Spoon(fed; 4 trials) Oral Phase Impairments: Impaired mastication Oral Phase Functional Implications: (increased  time) Pharyngeal Phase Impairments: (none) Other Comments: moistened, broken down softened foods        Orinda Kenner, MS, CCC-SLP Erskin Zinda 03/02/2017,3:36 PM

## 2017-03-02 NOTE — Progress Notes (Signed)
Patient experienced agitation and anxiety throughout the night.  RN offered continued reassurance and frequent rounding as requested.  Patient verbalized frustration/anger about Palliative care consult.  RN encouraged patient to discuss with family.

## 2017-03-02 NOTE — Progress Notes (Signed)
RN has notified RT that pt sounds very congested, has wet sounds bilaterally, and  has a weak cough. Pt has orders for flutter valve, which she has demonstrated understanding with the teach back method, and chest physiotherapy every 4 hours. RT stated she will be up to percuss pt.'s lungs with device. VSS. RN will continue to encourage flutter valve usage.   Shianna Bally [employer]

## 2017-03-02 NOTE — Progress Notes (Signed)
Pt sat in chair for a hour and half today. RN and NT used hoyer lift to get pt into chair and used it getting pt back into bed. Pt tolerated the transportation well and was able to start coughing some secretions up. Pt alert and oriented to self, VSS.   Jerremy Maione [employer]

## 2017-03-03 ENCOUNTER — Inpatient Hospital Stay: Payer: Medicare Other | Admitting: Internal Medicine

## 2017-03-03 LAB — BASIC METABOLIC PANEL
Anion gap: 10 (ref 5–15)
BUN: 8 mg/dL (ref 6–20)
CO2: 31 mmol/L (ref 22–32)
CREATININE: 0.31 mg/dL — AB (ref 0.44–1.00)
Calcium: 8.7 mg/dL — ABNORMAL LOW (ref 8.9–10.3)
Chloride: 87 mmol/L — ABNORMAL LOW (ref 101–111)
GFR calc Af Amer: >60 mL/min (ref >60)
GFR calc non Af Amer: >60 mL/min (ref >60)
GLUCOSE: 114 mg/dL — AB (ref 65–99)
Potassium: 4.2 mmol/L (ref 3.5–5.1)
SODIUM: 128 mmol/L — AB (ref 135–145)

## 2017-03-03 LAB — PROCALCITONIN

## 2017-03-03 MED ORDER — VITAMIN C 500 MG PO TABS
250.0000 mg | ORAL_TABLET | Freq: Two times a day (BID) | ORAL | Status: DC
  Administered 2017-03-03 – 2017-03-04 (×3): 250 mg via ORAL
  Filled 2017-03-03 (×3): qty 0.5

## 2017-03-03 MED ORDER — SODIUM CHLORIDE 1 G PO TABS
1.0000 g | ORAL_TABLET | Freq: Two times a day (BID) | ORAL | Status: DC
  Administered 2017-03-03 – 2017-03-04 (×3): 1 g via ORAL
  Filled 2017-03-03 (×3): qty 1

## 2017-03-03 MED ORDER — LORAZEPAM 0.5 MG PO TABS
0.5000 mg | ORAL_TABLET | Freq: Every day | ORAL | Status: DC
  Administered 2017-03-03: 0.5 mg via ORAL
  Filled 2017-03-03: qty 1

## 2017-03-03 MED ORDER — LORAZEPAM 0.5 MG PO TABS: 0.2500 mg | ORAL_TABLET | ORAL | Status: DC | PRN

## 2017-03-03 NOTE — Progress Notes (Signed)
Nutrition Follow Up Note   DOCUMENTATION CODES:   Obesity unspecified  INTERVENTION:   Ensure Enlive po BID, each supplement provides 350 kcal and 20 grams of protein  Add Magic cup TID with meals, each supplement provides 290 kcal and 9 grams of protein  MVI daily  Dysphagia 3 diet   Vitamin C 250mg  BID  NUTRITION DIAGNOSIS:   Increased nutrient needs related to cancer and cancer related treatments, other (see comment)(COPD, CHF, DM, HCAP) as evidenced by increased estimated needs from protein.  GOAL:   Patient will meet greater than or equal to 90% of their needs  MONITOR:   PO intake, Supplement acceptance, Weight trends, Labs, I & O's   ASSESSMENT:   83 y/o F with COPD on 2l chronic o2, OSA on CPAP, arthritis, h/o breast cancer and colon cancer, DM who is bedbound at baseline, from a [facility] term care presented with shortness of breath and productive cough. Admitted with HCAP   Pt continues to have fair appetite eating 25-100% of meals; ate 85% of breakfast this morning. Pt is drinking some Ensure. Pt seen by SLP 2/27 and recommended for dysphagia 3 diet. Continue supplements and MVI. RD will add vitamin C as pt noted to have widespread ecchymosis. Per chart, pt with 7lb weight loss since admit; however, pt has not been weighed since 2/25; recommend obtain new weight.     Medications reviewed and include: aspirin, lovenox, MVI, KCl, senokot, NaCl tabs, unasyn  Labs reviewed: Na 128(L), K 4.2 wnl, Cl 87(L), Ca 8.7(L), creat 0.31(L)  Diet Order:  DIET DYS 3 Room service appropriate? No; Fluid consistency: Thin  EDUCATION NEEDS:   Education needs have been addressed  Skin: Reviewed RN Assessment  Last BM:  2/28- type 4  Height:   Ht Readings from Last 1 Encounters:  02/26/17 5\' 4"  (1.626 m)    Weight:   Wt Readings from Last 1 Encounters:  02/28/17 208 lb 8.9 oz (94.6 kg)    Ideal Body Weight:  54.5 kg  BMI:  Body mass index is 35.8 kg/m.  Estimated  Nutritional Needs:   Kcal:  1700-2000kcal/day   Protein:  95-113g/day   Fluid:  >1.7L/day   Koleen Distance MS, RD, LDN Pager #[phone] After Hours Pager: [phone]

## 2017-03-03 NOTE — Progress Notes (Signed)
[facility]  ROUNDING NOTE   Subjective:   Off IV fluids. Seated in chair.   Family meeting yesterday. Patient to return to SNF  Na 128  Objective:  Vital signs in last 24 hours:  Temp:  [98.1 F (36.7 C)-98.3 F (36.8 C)] 98.3 F (36.8 C) (02/28 0924) Pulse Rate:  [60-107] 107 (02/28 0924) Resp:  [20-24] 20 (02/28 0502) BP: (108-135)/(56-87) 117/57 (02/28 0924) SpO2:  [93 %-100 %] 98 % (02/28 0924)  Weight change:  Filed Weights   02/23/17 1937 02/26/17 0054 02/28/17 1359  Weight: 97.5 kg (215 lb) 97.5 kg (215 lb) 94.6 kg (208 lb 8.9 oz)    Intake/Output: I/O last 3 completed shifts: In: 1290 [P.O.:840; I.V.:250; IV Piggyback:200] Out: 3756 [Urine:3753; Stool:3]   Intake/Output this shift:  Total I/O In: 340 [P.O.:240; IV Piggyback:100] Out: 250 [Urine:250]  Physical Exam: General: NAD,   Head: Normocephalic, atraumatic. Moist oral mucosal membranes  Eyes: Anicteric, PERRL  Neck: Supple, trachea midline  Lungs:  Clear to auscultation  Heart: Regular rate and rhythm  Abdomen:  Soft, nontender,   Extremities:  no peripheral edema.  Neurologic: Alert to self  Skin: No lesions       Basic Metabolic Panel: Recent Labs  Lab 02/27/17 0748  02/28/17 0745 02/28/17 1510 02/28/17 1842 03/01/17 0337 03/02/17 0451 03/03/17 0331  NA 122*   < > 125* 125* 128* 129* 127* 128*  K 3.7  --  3.4*  --   --  3.8 4.3 4.2  CL 80*  --  87*  --   --  93* 90* 87*  CO2 31  --  30  --   --  29 30 31   GLUCOSE 102*  --  92  --   --  107* 160* 114*  BUN 15  --  13  --   --  11 8 8   CREATININE 0.62  --  0.68  --   --  0.36* 0.32* 0.31*  CALCIUM 8.9  --  8.4*  --   --  8.4* 8.5* 8.7*   < > = values in this interval not displayed.    Liver Function Tests: No results for input(s): AST, ALT, ALKPHOS, BILITOT, PROT, ALBUMIN in the last 168 hours. No results for input(s): LIPASE, AMYLASE in the last 168 hours. Recent Labs  Lab 03/01/17 1216  AMMONIA <9*     CBC: Recent Labs  Lab 02/27/17 0748  WBC 7.1  HGB 12.0  HCT 35.7  MCV 86.4  PLT 259    Cardiac Enzymes: No results for input(s): CKTOTAL, CKMB, CKMBINDEX, TROPONINI in the last 168 hours.  BNP: Invalid input(s): POCBNP  CBG: No results for input(s): GLUCAP in the last 168 hours.  Microbiology: Results for orders placed or performed during the hospital encounter of 02/23/17  Culture, blood (routine x 2)     Status: None   Collection Time: 02/23/17  7:38 PM  Result Value Ref Range Status   Specimen Description BLOOD BLOOD RIGHT FOREARM  Final   Special Requests   Final    BOTTLES DRAWN AEROBIC AND ANAEROBIC Blood Culture adequate volume   Culture   Final    NO GROWTH 5 DAYS Performed at [facility], [address] [place], [place] [address]    Report Status 02/28/2017 FINAL  Final  Culture, blood (routine x 2)     Status: None   Collection Time: 02/23/17  7:38 PM  Result Value Ref Range Status  Specimen Description BLOOD BLOOD LEFT FOREARM  Final   Special Requests   Final    BOTTLES DRAWN AEROBIC AND ANAEROBIC Blood Culture results may not be optimal due to an excessive volume of blood received in culture bottles   Culture   Final    NO GROWTH 5 DAYS Performed at [facility], [address] [place], [place] [address]    Report Status 02/28/2017 FINAL  Final  Culture, expectorated sputum-assessment     Status: None   Collection Time: 02/24/17 12:10 PM  Result Value Ref Range Status   Specimen Description Expect. Sput  Final   Special Requests NONE  Final   Sputum evaluation   Final    THIS SPECIMEN IS ACCEPTABLE FOR SPUTUM CULTURE Performed at [facility], [address] [place], [place] [address]    Report Status 02/24/2017 FINAL  Final  Culture, respiratory (NON-Expectorated)     Status: None   Collection Time: 02/24/17 12:10 PM  Result Value Ref Range Status   Specimen Description   Final    Expect. Sput Performed  at [facility], [address], [place], [place] [address]    Special Requests   Final    NONE Reflexed from [id number] Performed at [facility], [place], [place] [address]    Gram Stain   Final    RARE WBC PRESENT, PREDOMINANTLY PMN RARE GRAM NEGATIVE COCCOBACILLI RARE SQUAMOUS EPITHELIAL CELLS PRESENT Performed at [facility], [place] [address] [place],  [address]    Culture   Final    RARE FUNGUS (MOLD) ISOLATED, PROBABLE CONTAMINANT/COLONIZER (SAPROPHYTE). CONTACT MICROBIOLOGY IF FURTHER IDENTIFICATION REQUIRED [phone].   Report Status 02/27/2017 FINAL  Final  MRSA PCR Screening     Status: None   Collection Time: 02/26/17  3:04 PM  Result Value Ref Range Status   MRSA by PCR NEGATIVE NEGATIVE Final    Comment:        The GeneXpert MRSA Assay (FDA approved for NASAL specimens only), is one component of a comprehensive MRSA colonization surveillance program. It is not intended to diagnose MRSA infection nor to guide or monitor treatment for MRSA infections. Performed at [facility], [address] [place],  [address]     Coagulation Studies: No results for input(s): LABPROT, INR in the last 72 hours.  Urinalysis: No results for input(s): COLORURINE, LABSPEC, PHURINE, GLUCOSEU, HGBUR, BILIRUBINUR, KETONESUR, PROTEINUR, UROBILINOGEN, NITRITE, LEUKOCYTESUR in the last 72 hours.  Invalid input(s): APPERANCEUR    Imaging: Ct Chest W Contrast  Result Date: 03/02/2017 CLINICAL DATA:  Shortness of breath. EXAM: CT CHEST WITH CONTRAST TECHNIQUE: Multidetector CT imaging of the chest was performed during intravenous contrast administration. CONTRAST:  74mL ISOVUE-300 IOPAMIDOL (ISOVUE-300) INJECTION 61% COMPARISON:  CT scan of May 23, 2011. FINDINGS: Cardiovascular: Atherosclerosis of thoracic aorta is noted without aneurysm or dissection. Mild cardiomegaly is noted. No pericardial effusion is noted.  Left-sided pacemaker is noted. Coronary artery calcifications are noted. Enlargement of pulmonary arteries is noted suggesting pulmonary artery hypertension. Mediastinum/Nodes: No mediastinal adenopathy is noted. The esophagus is unremarkable. Patient appears to be status post partial left thyroid resection. Lungs/Pleura: No pneumothorax is noted. Mild bilateral pleural effusions are noted with adjacent subsegmental atelectasis. Upper Abdomen: No acute abnormality. Musculoskeletal: No chest wall abnormality. No acute or significant osseous findings. IMPRESSION: Coronary artery calcifications are noted. Enlargement of pulmonary arteries is noted suggesting pulmonary artery hypertension. Mild bilateral pleural effusions are noted with adjacent subsegmental  atelectasis. Aortic Atherosclerosis (ICD10-I70.0). Electronically Signed   By: Marijo Conception, M.D.   On: 03/02/2017 13:12     Medications:   . ampicillin-sulbactam (UNASYN) IV Stopped (03/03/17 1230)   . aspirin EC  81 mg Oral Daily  . divalproex  250 mg Oral BID  . DULoxetine  60 mg Oral Daily  . enoxaparin (LOVENOX) injection  40 mg Subcutaneous Q24H  . feeding supplement (ENSURE ENLIVE)  237 mL Oral BID BM  . guaiFENesin  600 mg Oral BID  . ipratropium-albuterol  3 mL Nebulization TID  . LORazepam  0.5 mg Oral QHS  . Melatonin  10 mg Oral QHS  . mometasone-formoterol  2 puff Inhalation BID  . multivitamin with minerals  1 tablet Oral Daily  . nystatin  5 mL Mouth/Throat QID  . nystatin   Topical BID  . potassium chloride  10 mEq Oral QODAY  . pravastatin  10 mg Oral q1800  . senna-docusate  1 tablet Oral BID  . sodium chloride  1 g Oral BID WC  . trandolapril  2 mg Oral Daily  . traZODone  100 mg Oral QHS  . vitamin C  250 mg Oral BID   acetaminophen **OR** acetaminophen, LORazepam, ondansetron **OR** ondansetron (ZOFRAN) IV  Assessment/ Plan:  Ms. Kathryn Hayes is a 83 y.o. white female with COPD 2 L O2 [place], Arthritis, h/o  breast cancer, colon cancer, diabetes  bed bound status, was admitted on 02/23/2017 with worsening shortness of breath,  Patient is resident of [facility] facility.  1. Hyponatremia: improving with IV fluids. baseline sodium level of 131 from February 24, 2017. Acute hyponatremia from hypovolemia.  - Start salt tabs.   2. Hypertension: blood pressure 155/81. Diastolic congestive heart failure. Echo 2/22 - holding furosemide.  - trandolapril  3. Hypokalemia:  - PO potassium chloride   LOS: 8 Heru Montz 2/28/20191:40 PM

## 2017-03-03 NOTE — Progress Notes (Signed)
[facility] NAME: Kathryn Hayes    MR#:  [id number]  DATE OF BIRTH:  02-22-1934  SUBJECTIVE:  CHIEF COMPLAINT:   Chief Complaint  Patient presents with  . Shortness of Breath   -more alert today, was able to sit a chair  -slept better, still has congested cough  REVIEW OF SYSTEMS:  Review of Systems  Constitutional: Negative for chills and fever.  HENT: Positive for hearing loss. Negative for congestion, ear discharge and nosebleeds.   Respiratory: Positive for cough and shortness of breath. Negative for wheezing.   Cardiovascular: Negative for chest pain and palpitations.  Gastrointestinal: Negative for abdominal pain, constipation, diarrhea, nausea and vomiting.  Genitourinary: Negative for dysuria.  Musculoskeletal: Negative for myalgias.  Neurological: Negative for dizziness, seizures and headaches.  Psychiatric/Behavioral: The patient is nervous/anxious.     DRUG ALLERGIES:  No Known Allergies  VITALS:  Blood pressure (!) 117/57, pulse (!) 107, temperature 98.3 F (36.8 C), temperature source Oral, resp. rate 20, height 5\' 4"  (1.626 m), weight 94.6 kg (208 lb 8.9 oz), SpO2 98 %.  PHYSICAL EXAMINATION:  Physical Exam  GENERAL:  83 y.o.-year-old patient lying in the bed with no acute distress.  EYES: Pupils equal, round, reactive to light and accommodation. No scleral icterus. Extraocular muscles intact.  HEENT: Head atraumatic, normocephalic. Oropharynx and nasopharynx clear.  NECK:  Supple, no jugular venous distention. No thyroid enlargement, no tenderness.  LUNGS: scattered wheezing, bibasilar rhonchi. No use of accessory muscles of respiration. Decreased bibasilar breath sounds CARDIOVASCULAR: S1, S2 normal. No murmurs, rubs, or gallops.  ABDOMEN: Soft, nontender, nondistended. Bowel sounds present. No organomegaly or mass.  EXTREMITIES: No pedal edema, cyanosis, or clubbing.  NEUROLOGIC: Cranial nerves II  through XII are intact. Sleepy, not following simple commands.. Sensation intact. Gait not checked. Global weakness noted PSYCHIATRIC: The patient is alert,  Oriented x 2. Following simple commands SKIN: No obvious rash, lesion, or ulcer.    LABORATORY PANEL:   CBC Recent Labs  Lab 02/27/17 0748  WBC 7.1  HGB 12.0  HCT 35.7  PLT 259   ------------------------------------------------------------------------------------------------------------------  Chemistries  Recent Labs  Lab 03/03/17 0331  NA 128*  K 4.2  CL 87*  CO2 31  GLUCOSE 114*  BUN 8  CREATININE 0.31*  CALCIUM 8.7*   ------------------------------------------------------------------------------------------------------------------  Cardiac Enzymes No results for input(s): TROPONINI in the last 168 hours. ------------------------------------------------------------------------------------------------------------------  RADIOLOGY:  Ct Chest W Contrast  Result Date: 03/02/2017 CLINICAL DATA:  Shortness of breath. EXAM: CT CHEST WITH CONTRAST TECHNIQUE: Multidetector CT imaging of the chest was performed during intravenous contrast administration. CONTRAST:  81mL ISOVUE-300 IOPAMIDOL (ISOVUE-300) INJECTION 61% COMPARISON:  CT scan of May 23, 2011. FINDINGS: Cardiovascular: Atherosclerosis of thoracic aorta is noted without aneurysm or dissection. Mild cardiomegaly is noted. No pericardial effusion is noted. Left-sided pacemaker is noted. Coronary artery calcifications are noted. Enlargement of pulmonary arteries is noted suggesting pulmonary artery hypertension. Mediastinum/Nodes: No mediastinal adenopathy is noted. The esophagus is unremarkable. Patient appears to be status post partial left thyroid resection. Lungs/Pleura: No pneumothorax is noted. Mild bilateral pleural effusions are noted with adjacent subsegmental atelectasis. Upper Abdomen: No acute abnormality. Musculoskeletal: No chest wall abnormality. No acute  or significant osseous findings. IMPRESSION: Coronary artery calcifications are noted. Enlargement of pulmonary arteries is noted suggesting pulmonary artery hypertension. Mild bilateral pleural effusions are noted with adjacent subsegmental atelectasis. Aortic Atherosclerosis (ICD10-I70.0). Electronically Signed   By: Marijo Conception,  M.D.   On: 03/02/2017 13:12    EKG:   Orders placed or performed during the hospital encounter of 02/23/17  . EKG 12-Lead  . EKG 12-Lead    ASSESSMENT AND PLAN:   83 y/o F with COPD on 2l chronic o2, OSA on CPAP, arthritis, h/o breast cancer and colon cancer, DM who is bedbound at baseline, from a [facility] term care presented with shortness of breath and productive cough.  1. HCAP- CXR with left basilar atelectasis - MRSA pcr is negative - Concern for aspiration. Speech therapy consulted - aspiration precautions advised and started on Unasyn- stop at discharge tomorrow - on mucinex. Chest PT ordered -incentive spirometry when more alert  2. Acute encephalopathy- secondary to acute hyponatremia and also toxic metabolic from medications -Patient has chronic hyponatremia baseline sodium series between 128 to 130. -ABG  with normal CO2, has metabolic alkalosis. -No focal neurological deficits. CT of the head with moderate global atrophy but no acute findings. -Sodium levels fluctuating.improved with fluids, starting salt tablets today -CT chest with no lung mass that would explain her SIADH -Nephrology has been consulted.  -No renal disease, no systolic heart failure at baseline. Right upper upper quadrant ultrasound showed hepatic steatosis.  3. COPD exacerbation- acute on chronic - Finished oral steroids, nebs and inhalers - CT chest with pulm HTN- outpt pulm follow up recommended  4. Chronic diastolic heart failure- chest x-ray with no pulmonary edema. Lasix has been held for now  5. DVT Prophylaxis- lovenox  From [facility] term facility-from  [facility] consulted Possible discharge tomorrow Updated daughter over the phone today     All the records are reviewed and case discussed with Care Management/Social Workerr. Management plans discussed with the patient, family and they are in agreement.  CODE STATUS: Full Code  TOTAL TIME TAKING CARE OF THIS PATIENT: 36 minutes.   POSSIBLE D/C tomorrow, DEPENDING ON CLINICAL CONDITION.   Gladstone Lighter M.D on 03/03/2017 at 12:47 PM  Between 7am to 6pm - Pager - [phone]  After 6pm go to [email or web address] - password [email or web address] [facility]  Office  [phone]  CC: Primary care physician; Juluis Pitch, MD

## 2017-03-03 NOTE — Progress Notes (Addendum)
  Speech Language Pathology Treatment: Dysphagia  Patient Details Name: Kathryn Hayes MRN: [id number] DOB: [birth year] Today's Date: 03/03/2017 Time: 0962-8366 SLP Time Calculation (min) (ACUTE ONLY): 32 min  Assessment / Plan / Recommendation Clinical Impression  Pt seen for ongoing assessment and toleration of dysphagia diet. Pt sitting up in the chair today; much more supportive for eating/drinking and for her breathing. Another discipline has noted pt coughing when drinking thin liquids. Pt most frequently states "I'm fine".   Pt given trials of thin liquids of thin liquids w/ intermittent, overt coughing noted. Much was delayed and suspect pt does not relate this to her drinking/swallowing of liquids. Pt denied any problem when drinking her liquids stating she has "bad lungs right now". Strict monitoring w/ straw use(pt's preference for her control) and following aspiration precautions was attempted. Pt declined food trials but stated she is eating "ok"; NSG denies any deficits w/ eating/swallowing foods.  Will f/u w/ pt and pt's family for discussion re: modifying liquid consistency for trial period to aid swallowing and reduce risk for aspiration, pulmonary decline. NSG updated. Continue to recommend Pills in Puree; aspiration precautions; assistance at meals.     HPI HPI: Pt is a 83 y.o. female who presents after recent discharge from the hospital with persistent symptoms including shortness of breath, intermittent confusion, malaise and general weakness.  Patient meets sepsis criteria here with fever and tachycardia as well as elevated white blood cell count.  Of note, she has been on p.o. steroids since discharge which could have contributed to her white count.  However, she has clinical symptoms of persistent or recurrent pneumonia, including discolored sputum production and shortness of breath.  Chest x-ray did not show definitive pneumonia; Chest CT 03/02/2017 revealed Enlargement of  pulmonary arteries is noted suggesting pulmonary artery hypertension, bilateral pleural effusions are noted with adjacent subsegmental atelectasis, and coronary artery calcifications. Pt presents w/ increased respiratory effort at baseline when sitting in chair; min increase w/ exertion of talking and taking po's. Congested cough at baseline.       SLP Plan  Continue with current plan of care(will discuss w/ pt/family modifying diet)       Recommendations  Diet recommendations: Dysphagia 3 (mechanical soft);Thin liquid Liquids provided via: Cup(recommend monitoring or not using straw) Medication Administration: Whole meds with puree(for easier, safer swallowing) Supervision: Patient able to self feed;Full supervision/cueing for compensatory strategies(d/t impulsivity) Compensations: Minimize environmental distractions;Slow rate;Small sips/bites;Lingual sweep for clearance of pocketing;Multiple dry swallows after each bite/sip;Follow solids with liquid Postural Changes and/or Swallow Maneuvers: Seated upright 90 degrees;Upright 30-60 min after meal                General recommendations: (dietician f/u; RT f/u) Oral Care Recommendations: Oral care BID;Patient independent with oral care;Staff/trained caregiver to provide oral care Follow up Recommendations: (TBD) SLP Visit Diagnosis: Dysphagia, oropharyngeal phase (R13.12) Plan: Continue with current plan of care(will discuss w/ pt/family modifying diet)       GO                Orinda Kenner, MS, CCC-SLP Kathryn Hayes 03/03/2017, 11:28 AM

## 2017-03-04 ENCOUNTER — Encounter
Admission: RE | Admit: 2017-03-04 | Discharge: 2017-03-04 | Disposition: A | Discharge status: Home / Self Care | Payer: Medicare Other | Source: Ambulatory Visit | Attending: Internal Medicine | Admitting: Internal Medicine

## 2017-03-04 LAB — BASIC METABOLIC PANEL
Anion gap: 9 (ref 5–15)
BUN: 8 mg/dL (ref 6–20)
CO2: 32 mmol/L (ref 22–32)
Calcium: 8.8 mg/dL — ABNORMAL LOW (ref 8.9–10.3)
Chloride: 87 mmol/L — ABNORMAL LOW (ref 101–111)
Creatinine, Ser: 0.59 mg/dL (ref 0.44–1.00)
GFR calc Af Amer: >60 mL/min (ref >60)
Glucose, Bld: 103 mg/dL — ABNORMAL HIGH (ref 65–99)
Potassium: 4.1 mmol/L (ref 3.5–5.1)
SODIUM: 128 mmol/L — AB (ref 135–145)

## 2017-03-04 MED ORDER — LORAZEPAM 0.5 MG PO TABS: 0.5000 mg | ORAL_TABLET | Freq: Every day | ORAL | 0 refills | Status: DC

## 2017-03-04 MED ORDER — ENSURE ENLIVE PO LIQD: 237.0000 mL | Freq: Two times a day (BID) | ORAL | 12 refills | Status: AC

## 2017-03-04 MED ORDER — GUAIFENESIN ER 600 MG PO TB12: 600.0000 mg | ORAL_TABLET | Freq: Two times a day (BID) | ORAL | 0 refills | Status: AC

## 2017-03-04 MED ORDER — SODIUM CHLORIDE 1 G PO TABS: 1.0000 g | ORAL_TABLET | Freq: Two times a day (BID) | ORAL | 1 refills | Status: AC

## 2017-03-04 NOTE — Progress Notes (Signed)
Report called to Evern Bio LPN at [facility].

## 2017-03-04 NOTE — Discharge Summary (Addendum)
[facility] NAME: Kathryn Hayes    MR#:  [id number]  DATE OF BIRTH:  Aug 19, 1934  DATE OF ADMISSION:  02/23/2017   ADMITTING PHYSICIAN: Epifanio Lesches, MD  DATE OF DISCHARGE:  03/04/17  PRIMARY CARE PHYSICIAN: Juluis Pitch, MD   ADMISSION DIAGNOSIS:   Cough [R05] COPD exacerbation ([place]) [J44.1] Fever, unspecified fever cause [R50.9]  DISCHARGE DIAGNOSIS:   Principal Problem:   Sepsis ([place]) Active Problems:   COPD (chronic obstructive pulmonary disease) (HCC)   Hyperlipidemia, unspecified   Type 2 diabetes, controlled, with neuropathy (HCC)   Chronic systolic CHF (congestive heart failure) (HCC)   HTN (hypertension)   GERD (gastroesophageal reflux disease)   HCAP (healthcare-associated pneumonia)   SECONDARY DIAGNOSIS:   Past Medical History:  Diagnosis Date  . Anxiety    unspecified  . Arthritis   . Breast cancer ([place]) 2007   Early stage left breast cancer status post partial colectomy, 04/2005 (Dr. Oliva Bustard)  . Chest pain, unspecified   . CHF (congestive heart failure) ([place])   . Chronic headache   . Colon cancer ([place])    status post partial colectomy, diagnosed 2000  . Colon neoplasm   . COPD (chronic obstructive pulmonary disease) ([place])   . Depression    unspecified  . Diabetes mellitus   . Fibrocystic breast   . History of breast cancer   . History of colon cancer   . HTN (hypertension) with goal to be determined   . Hyperlipidemia   . Hyperlipidemia, unspecified   . Hypertension   . Obesity, unspecified   . Sleep apnea    mild to moderate    HOSPITAL COURSE:   83 y/o F with COPD on 2l chronic o2, OSA on CPAP, arthritis, h/o breast cancer and colon cancer, DM who is bedbound at baseline, from a [facility] term care presented with shortness of breath and productive cough.  1. HCAP- CXR with left basilar atelectasis - MRSA pcr is negative - Concern for aspiration. Speech therapy consulted -  aspiration precautions advised and received  Unasyn- stop at discharge - on mucinex. Chest physical therapy ordered -incentive spirometry - encourage sitting in a recliner daily  2. Acute encephalopathy- secondary to acute hyponatremia and also toxic metabolic from medications -Patient has chronic hyponatremia baseline sodium series between 128 to 130.  -But sodium is noted to be at 121, received IV fluids and restarted on Lasix and salt tablets. Sodium has been stable at 128. -ABG  with normal CO2, has metabolic alkalosis. -No focal neurological deficits. CT of the head with moderate global atrophy but no acute findings. -CT chest with no lung mass that would explain her SIADH -Nephrology has been consulted.  -No renal disease, no systolic heart failure at baseline. Right upper upper quadrant ultrasound showed hepatic steatosis.  3. COPD exacerbation- acute on chronic - Finished oral steroids, nebs and inhalers - CT chest with pulm HTN- outpt pulm follow up recommended  4. Chronic diastolic heart failure- chest x-ray with no pulmonary edema.  -On low-dose oral Lasix  5. Anxiety-takes Ativan at home, due to excessive daytime sleepiness-change the Ativan to only at bedtime and when necessary during the daytime. -Patient uses a CPAP at bedtime for her sleep apnea which needs to be continued -Continue her sleep medications. She is on melatonin and trazodone   From [facility] term facility-from [facility] consulted-patient and family wants full code and full aggressive measures at this time. follow  up at the rehab  Will be discharged today.   DISCHARGE CONDITIONS:   Very guarded  CONSULTS OBTAINED:    Nephrology consult by Dr. Juleen China  DRUG ALLERGIES:   No Known Allergies DISCHARGE MEDICATIONS:   Allergies as of 03/04/2017   No Known Allergies     Medication List    STOP taking these medications   docusate sodium 100 MG capsule Commonly known as:  COLACE    gabapentin 400 MG capsule Commonly known as:  NEURONTIN   glipiZIDE-metformin 2.5-500 MG tablet Commonly known as:  METAGLIP   itraconazole 100 MG capsule Commonly known as:  SPORANOX   predniSONE 10 MG tablet Commonly known as:  DELTASONE   sucralfate 1 g tablet Commonly known as:  CARAFATE     TAKE these medications   acetaminophen 325 MG tablet Commonly known as:  TYLENOL Take 650 mg by mouth every 4 (four) hours as needed for mild pain, moderate pain or fever. Do not exceed 3000 mg in 24hrs   albuterol (2.5 MG/3ML) 0.083% nebulizer solution Commonly known as:  PROVENTIL Take 2.5 mg by nebulization every 6 (six) hours as needed for wheezing or shortness of breath.   aspirin EC 81 MG tablet Take 81 mg by mouth daily.   bisacodyl 5 MG EC tablet Commonly known as:  DULCOLAX Take 2 tablets (10 mg total) by mouth daily as needed for moderate constipation.   budesonide 0.5 MG/2ML nebulizer solution Commonly known as:  PULMICORT Take 0.5 mg by nebulization 2 (two) times daily.   Cholecalciferol 2000 units Caps Take 2,000 Units by mouth daily.   COMBIVENT RESPIMAT 20-100 MCG/ACT Aers respimat Generic drug:  Ipratropium-Albuterol Inhale 1 puff into the lungs 4 (four) times daily.   divalproex 125 MG capsule Commonly known as:  DEPAKOTE SPRINKLE Take 250 mg by mouth 2 (two) times daily.   DULoxetine 60 MG capsule Commonly known as:  CYMBALTA Take 60 mg by mouth daily.   eucerin cream Apply liberal amount topically to dry areas of skin daily after bath and as needed. Ok to keep at bedside   feeding supplement (ENSURE ENLIVE) Liqd Take 237 mLs by mouth 2 (two) times daily between meals.   fluticasone-salmeterol 115-21 MCG/ACT inhaler Commonly known as:  ADVAIR HFA Inhale 2 puffs into the lungs 2 (two) times daily. 8 am and 8 pm   furosemide 20 MG tablet Commonly known as:  LASIX Take 20 mg by mouth daily.   guaiFENesin 600 MG 12 hr tablet Commonly known as:   MUCINEX Take 1 tablet (600 mg total) by mouth 2 (two) times daily.   guaiFENesin-dextromethorphan 100-10 MG/5ML syrup Commonly known as:  ROBITUSSIN DM Take 5 mLs by mouth every 4 (four) hours as needed for cough.   insulin aspart 100 UNIT/ML injection Commonly known as:  novoLOG Inject 3-12 Units into the skin 3 (three) times daily with meals.   ketotifen 0.025 % ophthalmic solution Commonly known as:  ZADITOR Place 1 drop into both eyes 2 (two) times daily.   LORazepam 0.5 MG tablet Commonly known as:  ATIVAN Take 1 tablet (0.5 mg total) by mouth every 4 (four) hours as needed. What changed:  Another medication with the same name was changed. Make sure you understand how and when to take each.   LORazepam 0.5 MG tablet Commonly known as:  ATIVAN Take 1 tablet (0.5 mg total) by mouth at bedtime. What changed:  when to take this   magnesium oxide 400 MG tablet Commonly  known as:  MAG-OX Take 400 mg by mouth 2 (two) times daily.   Melatonin 10 MG Tabs Take 10 mg by mouth at bedtime.   Menthol 1 MG Lozg Use as directed 1 lozenge in the mouth or throat every 6 (six) hours as needed. For irritation/pain   nystatin powder Commonly known as:  MYCOSTATIN/NYSTOP Apply 1 application topically to abdominal folds daily for redness and irritation.   pantoprazole 20 MG tablet Commonly known as:  PROTONIX Take 20 mg by mouth daily.   potassium chloride 10 MEQ tablet Commonly known as:  K-DUR,KLOR-CON Take 10 mEq by mouth every other day.   pravastatin 10 MG tablet Commonly known as:  PRAVACHOL Take 10 mg by mouth daily at 6 PM. On Sunday   SENIOR TABS Tabs Take 1 tablet by mouth daily.   senna-docusate 8.6-50 MG tablet Commonly known as:  Senokot-S Take 1 tablet by mouth 2 (two) times daily.   sodium chloride 1 g tablet Take 1 tablet (1 g total) by mouth 2 (two) times daily with a meal.   trandolapril 2 MG tablet Commonly known as:  MAVIK Take 2 mg by mouth daily.     traZODone 100 MG tablet Commonly known as:  DESYREL Take 1 tablet (100 mg total) by mouth at bedtime.        DISCHARGE INSTRUCTIONS:   1. PCP f/u in 1-2 weeks 2. Nephrology f/u in 1 week 3. Pulmonary follow up in 2-3 weeks 4. Palliative care follow up  DIET:   Dysphagia level 3(mech soft) w/ MINCED meats w/ Gravy added to moisten; NECTAR consistency liquids - straw ok. General aspiration precautions. Pills in Puree for safer swallowing.   ACTIVITY:   Activity as tolerated  OXYGEN:   Home Oxygen: Oxygen Oxygen Delivery: 2L via nasal cannula   DISCHARGE LOCATION:   nursing home   If you experience worsening of your admission symptoms, develop shortness of breath, life threatening emergency, suicidal or homicidal thoughts you must seek medical attention immediately by calling 911 or calling your MD immediately  if symptoms less severe.  You Must read complete instructions/literature along with all the possible adverse reactions/side effects for all the Medicines you take and that have been prescribed to you. Take any new Medicines after you have completely understood and accpet all the possible adverse reactions/side effects.   Please note  You were cared for by a hospitalist during your hospital stay. If you have any questions about your discharge medications or the care you received while you were in the hospital after you are discharged, you can call the unit and asked to speak with the hospitalist on call if the hospitalist that took care of you is not available. Once you are discharged, your primary care physician will handle any further medical issues. Please note that NO REFILLS for any discharge medications will be authorized once you are discharged, as it is imperative that you return to your primary care physician (or establish a relationship with a primary care physician if you do not have one) for your aftercare needs so that they can reassess your need for  medications and monitor your lab values.    On the day of Discharge:  VITAL SIGNS:   Blood pressure (!) 131/58, pulse (!) 107, temperature 97.7 F (36.5 C), temperature source Oral, resp. rate 17, height 5\' 4"  (1.626 m), weight 94.6 kg (208 lb 8.9 oz), SpO2 96 %.  PHYSICAL EXAMINATION:    GENERAL:  83 y.o.-year-old obese  patient lying in the bed with no acute distress.  EYES: Pupils equal, round, reactive to light and accommodation. No scleral icterus. Extraocular muscles intact.  HEENT: Head atraumatic, normocephalic. Oropharynx and nasopharynx clear.  NECK:  Supple, no jugular venous distention. No thyroid enlargement, no tenderness.  LUNGS: scattered wheezing, bibasilar rhonchi. No use of accessory muscles of respiration. Decreased bibasilar breath sounds CARDIOVASCULAR: S1, S2 normal. No murmurs, rubs, or gallops.  ABDOMEN: Soft, nontender, nondistended. Bowel sounds present. No organomegaly or mass.  EXTREMITIES: No pedal edema, cyanosis, or clubbing.  NEUROLOGIC: Cranial nerves II through XII are intact. alert, following simple commands.. Sensation intact. Gait not checked. Global weakness noted PSYCHIATRIC: The patient is alert,  Oriented x 2-3. Following simple commands SKIN: No obvious rash, lesion, or ulcer.      DATA REVIEW:   CBC Recent Labs  Lab 02/27/17 0748  WBC 7.1  HGB 12.0  HCT 35.7  PLT 259    Chemistries  Recent Labs  Lab 03/04/17 0750  NA 128*  K 4.1  CL 87*  CO2 32  GLUCOSE 103*  BUN 8  CREATININE 0.59  CALCIUM 8.8*     Microbiology Results  Results for orders placed or performed during the hospital encounter of 02/23/17  Culture, blood (routine x 2)     Status: None   Collection Time: 02/23/17  7:38 PM  Result Value Ref Range Status   Specimen Description BLOOD BLOOD RIGHT FOREARM  Final   Special Requests   Final    BOTTLES DRAWN AEROBIC AND ANAEROBIC Blood Culture adequate volume   Culture   Final    NO GROWTH 5 DAYS Performed  at [facility], [address] [place], [place] [address]    Report Status 02/28/2017 FINAL  Final  Culture, blood (routine x 2)     Status: None   Collection Time: 02/23/17  7:38 PM  Result Value Ref Range Status   Specimen Description BLOOD BLOOD LEFT FOREARM  Final   Special Requests   Final    BOTTLES DRAWN AEROBIC AND ANAEROBIC Blood Culture results may not be optimal due to an excessive volume of blood received in culture bottles   Culture   Final    NO GROWTH 5 DAYS Performed at [facility], [address] [place], [place] [address]    Report Status 02/28/2017 FINAL  Final  Culture, expectorated sputum-assessment     Status: None   Collection Time: 02/24/17 12:10 PM  Result Value Ref Range Status   Specimen Description Expect. Sput  Final   Special Requests NONE  Final   Sputum evaluation   Final    THIS SPECIMEN IS ACCEPTABLE FOR SPUTUM CULTURE Performed at [facility], [address] [place], [place] [address]    Report Status 02/24/2017 FINAL  Final  Culture, respiratory (NON-Expectorated)     Status: None   Collection Time: 02/24/17 12:10 PM  Result Value Ref Range Status   Specimen Description   Final    Expect. Sput Performed at [facility], [address] [place], [place] [address]    Special Requests   Final    NONE Reflexed from [id number] Performed at [facility], [place],  [address]    Gram Stain   Final    RARE WBC PRESENT, PREDOMINANTLY PMN RARE GRAM NEGATIVE COCCOBACILLI RARE SQUAMOUS EPITHELIAL CELLS PRESENT Performed at [facility], [place] [address], [place],  [address]    Culture  Final    RARE FUNGUS (MOLD) ISOLATED, PROBABLE CONTAMINANT/COLONIZER (SAPROPHYTE). CONTACT MICROBIOLOGY IF FURTHER IDENTIFICATION REQUIRED [phone].   Report Status 02/27/2017 FINAL  Final  MRSA PCR Screening     Status: None   Collection Time: 02/26/17  3:04 PM  Result Value  Ref Range Status   MRSA by PCR NEGATIVE NEGATIVE Final    Comment:        The GeneXpert MRSA Assay (FDA approved for NASAL specimens only), is one component of a comprehensive MRSA colonization surveillance program. It is not intended to diagnose MRSA infection nor to guide or monitor treatment for MRSA infections. Performed at [facility], [address] [place], [place] [address]     RADIOLOGY:  No results found.   Management plans discussed with the patient, family and they are in agreement.  CODE STATUS:     Code Status Orders  (From admission, onward)        Start     Ordered   02/24/17 0053  Full code  Continuous     02/24/17 0052    Code Status History    Date Active Date Inactive Code Status Order ID Comments User Context   02/14/2017 22:27 02/18/2017 19:51 Full Code [id number]  Demetrios Loll, MD Inpatient   09/21/2015 19:12 09/25/2015 21:00 Full Code [id number]  Vaughan Basta, MD Inpatient   08/15/2015 15:55 08/16/2015 09:10 Full Code [id number]  Hugelmeyer, Ubaldo Glassing, DO Inpatient      TOTAL TIME TAKING CARE OF THIS PATIENT: 38 minutes.    Gladstone Lighter M.D on 03/04/2017 at 10:10 AM  Between 7am to 6pm - Pager - [phone]  After 6pm go to [email or web address] - Proofreader  [facility]  Office  [phone]  CC: Primary care physician; Juluis Pitch, MD   Note: This dictation was prepared with Dragon dictation along with smaller phrase technology. Any transcriptional errors that result from this process are unintentional.

## 2017-03-04 NOTE — Progress Notes (Addendum)
Speech Language Pathology Treatment: Dysphagia  Patient Details Name: Kathryn Hayes MRN: [id number] DOB: [birth year] Today's Date: 03/04/2017 Time: 0950-1030 SLP Time Calculation (min) (ACUTE ONLY): 40 min  Assessment / Plan / Recommendation Clinical Impression  Pt seen for ongoing assessment and toleration of dysphagia diet. NSG had just been in pt's room and coughing was noted coming from pt's room prior to entering. Pt endorsed cough w/ trials of the Ensure consumed just previously w/ NSG. Coughing when drinking has been noted by another discipline as well. It did occur x3 during tx session yesterday.  At today's tx session, Nectar consistency liquids were attempted. Pt consumed ~3-4 ozs via Straw w/ instruction to sip slowly. NO overt s/s of aspiration were noted w/ the trials consumed during or post trials; clear vocal quality followed and no significant change in respiratory status. (Pt is easily SOB w/ any exertion from tasks including talking.) Pt was educated on/reminded of general aspiration precautions which she followed w/ gentle verbal cues.  Due to pt's baseline declined Pulmonary status and increased risk for aspiration secondary to reduced coordination of her breathing/swallowing timing thus airway compromise, the Nectar consistency liquid would be recommended - at least during meals. Pt stated  "I feel better not coughing" as well. If ok per MD, pt/family could consider consultation and education on pleasure ice chips and sips of water w/ supervision - but only for pleasure and NOT during meals.  Recommend a Dysphagia level 3 w/ Minced meats, gravy; NECTAR consistency liquids. General aspiration precautions; Rest Breaks to avoid SOB/WOB during meals; Pills in Puree for safer, swallowing. Assistance at meals for setup and positioning.  MD gave information re: tx session and recommendation for Nectar consistency liquids via phone to Dtr while in room. Pt can continue w/ f/u at SNF;  consider trials to upgrade liquid consistency in the diet next 4-5 weeks once medical and pulmonary status' are improved; w/ supervision. Pt agreed. MD agreed.    HPI HPI: Pt is a 83 y.o. female who presents after recent discharge from the hospital with persistent symptoms including shortness of breath, intermittent confusion, malaise and general weakness.  Patient meets sepsis criteria here with fever and tachycardia as well as elevated white blood cell count.  Of note, she has been on p.o. steroids since discharge which could have contributed to her white count.  However, she has clinical symptoms of persistent or recurrent pneumonia, including discolored sputum production and shortness of breath.  Chest x-ray did not show definitive pneumonia; Chest CT 03/02/2017 revealed Enlargement of pulmonary arteries is noted suggesting pulmonary artery hypertension, bilateral pleural effusions are noted with adjacent subsegmental atelectasis, and coronary artery calcifications. Pt presents w/ increased respiratory effort at baseline when sitting in chair; min increase w/ exertion of talking and taking po's. Congested cough at baseline. Pt has continued to exhibit intermittent, overt coughing when drinking liquids per report.       SLP Plan  Continue with current plan of care       Recommendations  Diet recommendations: Dysphagia 3 (mechanical soft);Nectar-thick liquid(w/ Minced meats, gravy) Liquids provided via: Cup;Straw(monitor) Medication Administration: Whole meds with puree(for easier, safer swallowing) Supervision: Patient able to self feed;Staff to assist with self feeding;Full supervision/cueing for compensatory strategies Compensations: Minimize environmental distractions;Slow rate;Small sips/bites;Lingual sweep for clearance of pocketing;Multiple dry swallows after each bite/sip;Follow solids with liquid Postural Changes and/or Swallow Maneuvers: Seated upright 90 degrees;Upright 30-60 min after  meal  General recommendations: (Dietician f/u) Oral Care Recommendations: Oral care BID;Patient independent with oral care;Staff/trained caregiver to provide oral care Follow up Recommendations: Skilled Nursing facility SLP Visit Diagnosis: Dysphagia, oropharyngeal phase (R13.12) Plan: Continue with current plan of care       [place], [place], CCC-SLP Hayes,Kathryn 03/04/2017, 10:42 AM

## 2017-03-04 NOTE — Progress Notes (Signed)
EMS here to transport patient

## 2017-03-04 NOTE — Progress Notes (Signed)
[facility]  ROUNDING NOTE   Subjective:   Na 128  Objective:  Vital signs in last 24 hours:  Temp:  [97.3 F (36.3 C)-98.3 F (36.8 C)] 98.1 F (36.7 C) (03/01 1131) Pulse Rate:  [103-115] 109 (03/01 1131) Resp:  [17-20] 17 (03/01 1131) BP: (84-151)/(49-71) 151/71 (03/01 1131) SpO2:  [93 %-99 %] 98 % (03/01 1131)  Weight change:  Filed Weights   02/23/17 1937 02/26/17 0054 02/28/17 1359  Weight: 97.5 kg (215 lb) 97.5 kg (215 lb) 94.6 kg (208 lb 8.9 oz)    Intake/Output: I/O last 3 completed shifts: In: 67 [P.O.:960; IV Piggyback:550] Out: 700 [Urine:700]   Intake/Output this shift:  Total I/O In: 120 [P.O.:120] Out: -   Physical Exam: General: NAD,   Head: Normocephalic, atraumatic. Moist oral mucosal membranes  Eyes: Anicteric, PERRL  Neck: Supple, trachea midline  Lungs:  Clear to auscultation  Heart: Regular rate and rhythm  Abdomen:  Soft, nontender,   Extremities:  no peripheral edema.  Neurologic: Alert to self  Skin: No lesions       Basic Metabolic Panel: Recent Labs  Lab 02/28/17 0745  02/28/17 1842 03/01/17 0337 03/02/17 0451 03/03/17 0331 03/04/17 0750  NA 125*   < > 128* 129* 127* 128* 128*  K 3.4*  --   --  3.8 4.3 4.2 4.1  CL 87*  --   --  93* 90* 87* 87*  CO2 30  --   --  29 30 31  32  GLUCOSE 92  --   --  107* 160* 114* 103*  BUN 13  --   --  11 8 8 8   CREATININE 0.68  --   --  0.36* 0.32* 0.31* 0.59  CALCIUM 8.4*  --   --  8.4* 8.5* 8.7* 8.8*   < > = values in this interval not displayed.    Liver Function Tests: No results for input(s): AST, ALT, ALKPHOS, BILITOT, PROT, ALBUMIN in the last 168 hours. No results for input(s): LIPASE, AMYLASE in the last 168 hours. Recent Labs  Lab 03/01/17 1216  AMMONIA <9*    CBC: Recent Labs  Lab 02/27/17 0748  WBC 7.1  HGB 12.0  HCT 35.7  MCV 86.4  PLT 259    Cardiac Enzymes: No results for input(s): CKTOTAL, CKMB, CKMBINDEX, TROPONINI in the last 168  hours.  BNP: Invalid input(s): POCBNP  CBG: No results for input(s): GLUCAP in the last 168 hours.  Microbiology: Results for orders placed or performed during the hospital encounter of 02/23/17  Culture, blood (routine x 2)     Status: None   Collection Time: 02/23/17  7:38 PM  Result Value Ref Range Status   Specimen Description BLOOD BLOOD RIGHT FOREARM  Final   Special Requests   Final    BOTTLES DRAWN AEROBIC AND ANAEROBIC Blood Culture adequate volume   Culture   Final    NO GROWTH 5 DAYS Performed at [facility], [address] [place], [place] [address]    Report Status 02/28/2017 FINAL  Final  Culture, blood (routine x 2)     Status: None   Collection Time: 02/23/17  7:38 PM  Result Value Ref Range Status   Specimen Description BLOOD BLOOD LEFT FOREARM  Final   Special Requests   Final    BOTTLES DRAWN AEROBIC AND ANAEROBIC Blood Culture results may not be optimal due to an excessive volume of blood received in culture bottles   Culture  Final    NO GROWTH 5 DAYS Performed at [facility], [address] [place], [place] [address]    Report Status 02/28/2017 FINAL  Final  Culture, expectorated sputum-assessment     Status: None   Collection Time: 02/24/17 12:10 PM  Result Value Ref Range Status   Specimen Description Expect. Sput  Final   Special Requests NONE  Final   Sputum evaluation   Final    THIS SPECIMEN IS ACCEPTABLE FOR SPUTUM CULTURE Performed at [facility], [address] [place], [place] [address]    Report Status 02/24/2017 FINAL  Final  Culture, respiratory (NON-Expectorated)     Status: None   Collection Time: 02/24/17 12:10 PM  Result Value Ref Range Status   Specimen Description   Final    Expect. Sput Performed at [facility], [address], [place], [place] [address]    Special Requests   Final    NONE Reflexed from [id number] Performed at [facility], [place], [place] [address]    Gram Stain   Final    RARE WBC PRESENT, PREDOMINANTLY PMN RARE GRAM NEGATIVE COCCOBACILLI RARE SQUAMOUS EPITHELIAL CELLS PRESENT Performed at [facility], [place] [address], [place], [place] [address]    Culture   Final    RARE FUNGUS (MOLD) ISOLATED, PROBABLE CONTAMINANT/COLONIZER (SAPROPHYTE). CONTACT MICROBIOLOGY IF FURTHER IDENTIFICATION REQUIRED [phone].   Report Status 02/27/2017 FINAL  Final  MRSA PCR Screening     Status: None   Collection Time: 02/26/17  3:04 PM  Result Value Ref Range Status   MRSA by PCR NEGATIVE NEGATIVE Final    Comment:        The GeneXpert MRSA Assay (FDA approved for NASAL specimens only), is one component of a comprehensive MRSA colonization surveillance program. It is not intended to diagnose MRSA infection nor to guide or monitor treatment for MRSA infections. Performed at [facility], [address] [place], [place] [address]     Coagulation Studies: No results for input(s): LABPROT, INR in the last 72 hours.  Urinalysis: No results for input(s): COLORURINE, LABSPEC, PHURINE, GLUCOSEU, HGBUR, BILIRUBINUR, KETONESUR, PROTEINUR, UROBILINOGEN, NITRITE, LEUKOCYTESUR in the last 72 hours.  Invalid input(s): APPERANCEUR    Imaging: Ct Chest W Contrast  Result Date: 03/02/2017 CLINICAL DATA:  Shortness of breath. EXAM: CT CHEST WITH CONTRAST TECHNIQUE: Multidetector CT imaging of the chest was performed during intravenous contrast administration. CONTRAST:  70mL ISOVUE-300 IOPAMIDOL (ISOVUE-300) INJECTION 61% COMPARISON:  CT scan of May 23, 2011. FINDINGS: Cardiovascular: Atherosclerosis of thoracic aorta is noted without aneurysm or dissection. Mild cardiomegaly is noted. No pericardial effusion is noted. Left-sided pacemaker is noted. Coronary artery calcifications are noted. Enlargement of pulmonary arteries is noted suggesting pulmonary artery hypertension. Mediastinum/Nodes: No mediastinal  adenopathy is noted. The esophagus is unremarkable. Patient appears to be status post partial left thyroid resection. Lungs/Pleura: No pneumothorax is noted. Mild bilateral pleural effusions are noted with adjacent subsegmental atelectasis. Upper Abdomen: No acute abnormality. Musculoskeletal: No chest wall abnormality. No acute or significant osseous findings. IMPRESSION: Coronary artery calcifications are noted. Enlargement of pulmonary arteries is noted suggesting pulmonary artery hypertension. Mild bilateral pleural effusions are noted with adjacent subsegmental atelectasis. Aortic Atherosclerosis (ICD10-I70.0). Electronically Signed   By: Marijo Conception, M.D.   On: 03/02/2017 13:12     Medications:   . ampicillin-sulbactam (UNASYN) IV Stopped (03/04/17 3810)   . aspirin EC  81 mg Oral Daily  . divalproex  250 mg Oral BID  . DULoxetine  60 mg Oral Daily  . enoxaparin (LOVENOX) injection  40 mg Subcutaneous Q24H  . feeding supplement (ENSURE ENLIVE)  237 mL Oral BID BM  . guaiFENesin  600 mg Oral BID  . ipratropium-albuterol  3 mL Nebulization TID  . LORazepam  0.5 mg Oral QHS  . Melatonin  10 mg Oral QHS  . mometasone-formoterol  2 puff Inhalation BID  . multivitamin with minerals  1 tablet Oral Daily  . nystatin  5 mL Mouth/Throat QID  . nystatin   Topical BID  . potassium chloride  10 mEq Oral QODAY  . pravastatin  10 mg Oral q1800  . senna-docusate  1 tablet Oral BID  . sodium chloride  1 g Oral BID WC  . trandolapril  2 mg Oral Daily  . traZODone  100 mg Oral QHS  . vitamin C  250 mg Oral BID   acetaminophen **OR** acetaminophen, LORazepam, ondansetron **OR** ondansetron (ZOFRAN) IV  Assessment/ Plan:  Kathryn Hayes is a 83 y.o. white female with COPD 2 L O2 [place], Arthritis, h/o breast cancer, colon cancer, diabetes  bed bound status, was admitted on 02/23/2017 with worsening shortness of breath,  Patient is resident of [facility] facility.  1. Hyponatremia:  improving with IV fluids. baseline sodium level of 131 from February 24, 2017. Acute hyponatremia from hypovolemia.  - Continue salt tabs.   2. Hypertension: blood pressure 151/71. Diastolic congestive heart failure. Echo 2/22 - holding furosemide.  - trandolapril  3. Hypokalemia:  - PO potassium chloride   LOS: 9 Callie Bunyard 3/1/201911:50 AM

## 2017-03-04 NOTE — Plan of Care (Signed)
  Progressing Safety: Ability to remain free from injury will improve 03/04/2017 0903 - Progressing by Etheleen Nicks, RN Note Fall precautions in place, non skid socks when oob   Not Progressing Clinical Measurements: Will remain free from infection 03/04/2017 3612 - Not Progressing by Etheleen Nicks, RN Note Receiving IV antibiotics, remains afebrile Diagnostic test results will improve 03/04/2017 0903 - Not Progressing by Etheleen Nicks, RN Note NA 128 this am Respiratory complications will improve 03/04/2017 0903 - Not Progressing by Etheleen Nicks, RN Note Still congested with cough

## 2017-03-04 NOTE — Progress Notes (Addendum)
Patient is medically stable to return to [facility] today. Discussed with facility who is in agreement with return  Patient will go back to [id number] [phone] report number Patient will transport by EMS. Will update family once all dc paperwork has been completed. Daughter called and updated.   No other needs at this time. DC back to [place] today.   Lane Hacker, MSW Clinical Social Work: Printmaker Coverage for :  [phone]

## 2017-03-04 NOTE — NC FL2 (Addendum)
[place] LEVEL OF CARE SCREENING TOOL     IDENTIFICATION  Patient Name: Kathryn Hayes Birthdate: September 27, 1934 Sex: female Admission Date (Current Location): 02/23/2017  [place] and [place] Number:  Engineering geologist and Address:  [facility], [address], [place], [place] [address]      Provider Number: [id number]  Attending Physician Name and Address:  Gladstone Lighter, MD  Relative Name and Phone Number:       Current Level of Care: Hospital Recommended Level of Care: [place] Prior Approval Number:    Date Approved/Denied:   PASRR Number:    Discharge Plan: SNF    Current Diagnoses: Patient Active Problem List   Diagnosis Date Noted  . HCAP (healthcare-associated pneumonia) 02/23/2017  . Sepsis ([place]) 02/23/2017  . Acute respiratory failure with hypoxia ([place]) 02/14/2017  . Dependence on supplemental oxygen 03/25/2016  . Primary central sleep apnea 03/25/2016  . Dependence on other enabling machines and devices 03/25/2016  . Presence of cardiac pacemaker 03/25/2016  . Bradycardia, sinus 03/25/2016  . Second degree AV block 03/25/2016  . Major depression, single episode 03/25/2016  . Anxiety disorder 03/25/2016  . Vitamin D deficiency 03/25/2016  . Insomnia 03/25/2016  . GERD (gastroesophageal reflux disease) 03/25/2016  . Syndrome of inappropriate antidiuretic hormone ([place]) 03/25/2016  . Migraine without status migrainosus, not intractable 03/25/2016  . Personal history of other malignant neoplasm of large intestine 03/25/2016  . Personal history of malignant neoplasm of breast 03/25/2016  . HTN (hypertension) 12/16/2015  . Chronic systolic CHF (congestive heart failure) ([place]) 08/16/2015  . 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 Barrett's esophagus 01/05/2012  . Hypoxemic respiratory failure, chronic ([place]) 03/10/2011  . COPD (chronic obstructive pulmonary disease) ([place])   . Hypertensive heart disease  with CHF (congestive heart failure) ([place])   . Hyperlipidemia, unspecified   . Type 2 diabetes, controlled, with neuropathy (HCC)     Orientation RESPIRATION BLADDER Height & Weight     Self, Place  O2(2L) Incontinent Weight: 208 lb 8.9 oz (94.6 kg) Height:  5\' 4"  (162.6 cm)  BEHAVIORAL SYMPTOMS/MOOD NEUROLOGICAL BOWEL NUTRITION STATUS  (none) (none) Incontinent Diet  Diet recommendations: Dysphagia 3 (mechanical soft);Thin liquid Liquids provided via: Cup(recommend monitoring or not using straw) Medication Administration: Whole meds with puree(for easier, safer swallowing) Supervision: Patient able to self feed;Full supervision/cueing for compensatory strategies(d/t impulsivity) Compensations: Minimize environmental distractions;Slow rate;Small sips/bites;Lingual sweep for clearance of pocketing;Multiple dry swallows after each bite/sip;Follow solids with liquid Postural Changes and/or Swallow Maneuvers: Seated upright 90 degrees;Upright 30-60 min after meal  AMBULATORY STATUS COMMUNICATION OF NEEDS Skin   Extensive Assist Verbally Normal                       Personal Care Assistance Level of Assistance  Dressing, Bathing Bathing Assistance: Limited assistance Feeding assistance: Limited assistance Dressing Assistance: Limited assistance     Functional Limitations Info  Sight, Hearing, Speech Sight Info: Adequate Hearing Info: Adequate Speech Info: Adequate    SPECIAL CARE FACTORS FREQUENCY  PT (By licensed PT), OT (By licensed OT)     PT Frequency: 5x OT Frequency: 5x            Contractures Contractures Info: Not present    Additional Factors Info  Code Status, Allergies Code Status Info: Full Code Allergies Info: NKA           Current Medications (03/04/2017):  This is the current hospital active medication list Current  Facility-Administered Medications  Medication Dose Route Frequency Provider Last Rate Last Dose  . acetaminophen (TYLENOL) tablet  650 mg  650 mg Oral Q6H PRN Lance Coon, MD   650 mg at 03/03/17 2103   Or  . acetaminophen (TYLENOL) suppository 650 mg  650 mg Rectal Q6H PRN Lance Coon, MD      . Ampicillin-Sulbactam (UNASYN) 3 g in sodium chloride 0.9 % 100 mL IVPB  3 g Intravenous Q6H Anette Riedel, RPH   Stopped at 03/04/17 2706  . aspirin EC tablet 81 mg  81 mg Oral Daily Lance Coon, MD   81 mg at 03/04/17 0949  . divalproex (DEPAKOTE SPRINKLE) capsule 250 mg  250 mg Oral BID Lance Coon, MD   250 mg at 03/04/17 0949  . DULoxetine (CYMBALTA) DR capsule 60 mg  60 mg Oral Daily Lance Coon, MD   60 mg at 03/04/17 0949  . enoxaparin (LOVENOX) injection 40 mg  40 mg Subcutaneous Q24H Lance Coon, MD   40 mg at 03/03/17 2105  . feeding supplement (ENSURE ENLIVE) (ENSURE ENLIVE) liquid 237 mL  237 mL Oral BID BM Gladstone Lighter, MD   237 mL at 03/04/17 0949  . guaiFENesin (MUCINEX) 12 hr tablet 600 mg  600 mg Oral BID Gladstone Lighter, MD   600 mg at 03/04/17 0949  . ipratropium-albuterol (DUONEB) 0.5-2.5 (3) MG/3ML nebulizer solution 3 mL  3 mL Nebulization TID Gladstone Lighter, MD   3 mL at 03/04/17 0739  . LORazepam (ATIVAN) tablet 0.25 mg  0.25 mg Oral Q4H PRN Gladstone Lighter, MD      . LORazepam (ATIVAN) tablet 0.5 mg  0.5 mg Oral QHS Gladstone Lighter, MD   0.5 mg at 03/03/17 2105  . Melatonin TABS 10 mg  10 mg Oral Corwin Levins, MD   10 mg at 03/03/17 2102  . mometasone-formoterol (DULERA) 200-5 MCG/ACT inhaler 2 puff  2 puff Inhalation BID Lance Coon, MD   2 puff at 03/04/17 0815  . multivitamin with minerals tablet 1 tablet  1 tablet Oral Daily Gladstone Lighter, MD   1 tablet at 03/04/17 0949  . nystatin (MYCOSTATIN) 100000 UNIT/ML suspension 500,000 Units  5 mL Mouth/Throat QID Gladstone Lighter, MD   500,000 Units at 03/04/17 0949  . nystatin (MYCOSTATIN/NYSTOP) topical powder   Topical BID Gladstone Lighter, MD      . ondansetron [facility]) tablet 4 mg  4 mg Oral Q6H PRN Lance Coon, MD       Or  . ondansetron [facility]) injection 4 mg  4 mg Intravenous Q6H PRN Lance Coon, MD      . potassium chloride (K-DUR,KLOR-CON) CR tablet 10 mEq  10 mEq Oral Jeannie Done, MD   10 mEq at 03/04/17 0949  . pravastatin (PRAVACHOL) tablet 10 mg  10 mg Oral q1800 Lance Coon, MD   10 mg at 03/03/17 1721  . senna-docusate (Senokot-S) tablet 1 tablet  1 tablet Oral BID Lance Coon, MD   1 tablet at 03/04/17 [phone]  . sodium chloride tablet 1 g  1 g Oral BID WC Gladstone Lighter, MD   1 g at 03/04/17 0814  . trandolapril (MAVIK) tablet 2 mg  2 mg Oral Daily Lance Coon, MD   2 mg at 03/04/17 0949  . traZODone (DESYREL) tablet 100 mg  100 mg Oral QHS Gladstone Lighter, MD   100 mg at 03/03/17 2103  . vitamin C (ASCORBIC ACID) tablet 250 mg  250 mg Oral BID Kalisetti,  Hart [place], MD   250 mg at 03/04/17 1583     Discharge Medications: Please see discharge summary for a list of discharge medications.  Relevant Imaging Results:  Relevant Lab Results:   Additional Information SSN: [id number]  [place], [place]

## 2017-03-04 NOTE — Progress Notes (Signed)
EMS called for non emergent transport to [facility]. 

## 2017-03-08 ENCOUNTER — Other Ambulatory Visit
Admission: RE | Admit: 2017-03-08 | Discharge: 2017-03-08 | Disposition: A | Discharge status: Home / Self Care | Payer: Medicare Other | Source: Ambulatory Visit | Attending: Gerontology | Admitting: Gerontology

## 2017-03-08 DIAGNOSIS — J441 Chronic obstructive pulmonary disease with (acute) exacerbation: Secondary | ICD-10-CM | POA: Insufficient documentation

## 2017-03-08 LAB — HEMOGLOBIN A1C
HEMOGLOBIN A1C: 6.1 % — AB (ref 4.8–5.6)
MEAN PLASMA GLUCOSE: 128.37 mg/dL

## 2017-03-08 LAB — COMPREHENSIVE METABOLIC PANEL
ALK PHOS: 39 U/L (ref 38–126)
ALT: 17 U/L (ref 14–54)
ANION GAP: 9 (ref 5–15)
AST: 15 U/L (ref 15–41)
Albumin: 2.9 g/dL — ABNORMAL LOW (ref 3.5–5.0)
BUN: 6 mg/dL (ref 6–20)
CALCIUM: 8.6 mg/dL — AB (ref 8.9–10.3)
CO2: 31 mmol/L (ref 22–32)
CREATININE: 0.34 mg/dL — AB (ref 0.44–1.00)
Chloride: 91 mmol/L — ABNORMAL LOW (ref 101–111)
Glucose, Bld: 90 mg/dL (ref 65–99)
Potassium: 4.2 mmol/L (ref 3.5–5.1)
Sodium: 131 mmol/L — ABNORMAL LOW (ref 135–145)
Total Bilirubin: 0.6 mg/dL (ref 0.3–1.2)
Total Protein: 5.3 g/dL — ABNORMAL LOW (ref 6.5–8.1)

## 2017-03-08 LAB — CBC WITH DIFFERENTIAL/PLATELET
Basophils Absolute: 0 10*3/uL (ref 0–0.1)
Basophils Relative: 1 %
EOS PCT: 4 %
Eosinophils Absolute: 0.3 10*3/uL (ref 0–0.7)
HCT: 30.7 % — ABNORMAL LOW (ref 35.0–47.0)
HEMOGLOBIN: 10.1 g/dL — AB (ref 12.0–16.0)
LYMPHS ABS: 1.2 10*3/uL (ref 1.0–3.6)
LYMPHS PCT: 18 %
MCH: 28.8 pg (ref 26.0–34.0)
MCHC: 32.8 g/dL (ref 32.0–36.0)
MCV: 87.7 fL (ref 80.0–100.0)
MONOS PCT: 9 %
Monocytes Absolute: 0.6 10*3/uL (ref 0.2–0.9)
Neutro Abs: 4.7 10*3/uL (ref 1.4–6.5)
Neutrophils Relative %: 68 %
PLATELETS: 200 10*3/uL (ref 150–440)
RBC: 3.5 MIL/uL — ABNORMAL LOW (ref 3.80–5.20)
RDW: 16.3 % — ABNORMAL HIGH (ref 11.5–14.5)
WBC: 6.9 10*3/uL (ref 3.6–11.0)

## 2017-03-08 LAB — TSH: TSH: 2.617 u[IU]/mL (ref 0.350–4.500)

## 2017-03-08 LAB — MAGNESIUM: Magnesium: 1.6 mg/dL — ABNORMAL LOW (ref 1.7–2.4)

## 2017-03-08 LAB — LIPID PANEL
CHOLESTEROL: 139 mg/dL (ref 0–200)
HDL: 62 mg/dL (ref >40)
LDL Cholesterol: 57 mg/dL (ref 0–99)
Total CHOL/HDL Ratio: 2.2 RATIO
Triglycerides: 99 mg/dL (ref <150)
VLDL: 20 mg/dL (ref 0–40)

## 2017-03-08 LAB — VITAMIN B12: VITAMIN B 12: 1013 pg/mL — AB (ref 180–914)

## 2017-03-09 LAB — VALPROIC ACID LEVEL, FREE: VALPROIC ACID FREE: 5.9 ug/mL — AB (ref 6.0–22.0)

## 2017-03-09 LAB — VITAMIN D 25 HYDROXY (VIT D DEFICIENCY, FRACTURES): VIT D 25 HYDROXY: 45.9 ng/mL (ref 30.0–100.0)

## 2017-03-17 ENCOUNTER — Telehealth: Payer: Self-pay | Admitting: Internal Medicine

## 2017-03-17 ENCOUNTER — Ambulatory Visit (INDEPENDENT_AMBULATORY_CARE_PROVIDER_SITE_OTHER): Payer: Medicare Other | Admitting: Internal Medicine

## 2017-03-17 ENCOUNTER — Encounter: Payer: Self-pay | Admitting: Internal Medicine

## 2017-03-17 ENCOUNTER — Other Ambulatory Visit: Payer: Self-pay | Admitting: Internal Medicine

## 2017-03-17 VITALS — BP 110/78 | HR 107 | Ht 64.0 in

## 2017-03-17 DIAGNOSIS — J449 Chronic obstructive pulmonary disease, unspecified: Secondary | ICD-10-CM | POA: Diagnosis not present

## 2017-03-17 DIAGNOSIS — J9610 Chronic respiratory failure, unspecified whether with hypoxia or hypercapnia: Secondary | ICD-10-CM | POA: Diagnosis not present

## 2017-03-17 DIAGNOSIS — T17900A Unspecified foreign body in respiratory tract, part unspecified causing asphyxiation, initial encounter: Secondary | ICD-10-CM

## 2017-03-17 NOTE — Progress Notes (Signed)
Synopsis: Kathryn Hayes was diagnosed with COPD in 2007 and came to the [facility] for the first time in 2013.  She has been hospitalized for a COPD flare in the past.   uses 2 L O2 continuously.  12/2010 Full PFT [facility]: Ratio 70%, FEV1 1.2 L (65%) clear obstruction on flow volume loop, TLC normal, RV 169% DLCO 41%  CC follow up COPD Follow up chronic  resp failure  HPI  She has chronic resp failure, COPD, CHF  She has a pacemaker   She is Wheel chair Morbid obesity Seems to be breathing comfortbaly She is now on NEB therapy    She is chronically SOB has chronic DOE, morbidly obese Poor resp effort, no signs if infection at this time Son at [place] today  Prognosis is very poor She states she uses CPAP daily   Review of Systems  Constitutional: Negative for chills, fatigue, fever and unexpected weight change.  HENT: Negative for congestion, nosebleeds, postnasal drip and rhinorrhea.   Respiratory: Positive for shortness of breath. Negative for cough, choking, chest tightness, wheezing and stridor.   Cardiovascular: Positive for leg swelling. Negative for chest pain and palpitations.  Gastrointestinal: Negative for abdominal distention and abdominal pain.  Musculoskeletal: Positive for arthralgias.  Neurological: Negative for dizziness and headaches.  All other systems reviewed and are negative.     Objective:  chronically ill appearing female in wheelchair BP 110/78 (BP Location: Left Arm, Cuff Size: Large)   Pulse (!) 107   Ht 5\' 4"  (1.626 m)   SpO2 91%   BMI 35.80 kg/m   Physical Exam  Constitutional: She is oriented to person, place, and time. No distress.  HENT:  Mouth/Throat: No oropharyngeal exudate.  Eyes: No scleral icterus.  Neck: Neck supple.  Cardiovascular: Normal rate, regular rhythm and normal heart sounds.  No murmur heard. Pulmonary/Chest: Effort normal. No stridor. No respiratory distress. She has no wheezes. She has rales.  Musculoskeletal:  Normal range of motion. She exhibits edema.  Neurological: She is alert and oriented to person, place, and time. No cranial nerve deficit.  Skin: Skin is warm. She is not diaphoretic.  Psychiatric: She has a normal mood and affect.    Vitals:   03/17/17 1025 03/17/17 1029  BP:  110/78  Pulse:  (!) 107  SpO2:  91%  Height: 5\' 4"  (1.626 m)   ON 3 L nasal cannula   Review of January 2013 CXR : hyperinflation, increased pulmonary vascularity  CT Chest January 2013: no pe, no clear infiltrate or significant emphysema  12/2010 Full PFT [facility]: Ratio 70%, FEV1 1.2 L (65%) clear obstruction on flow volume loop, TLC normal, RV 169% DLCO 41%  February 2016 chest x-ray images reviewed bilateral pleural effusion, bilateral airspace disease likely consistent with pulmonary edema April 2016 chest x-ray images reviewed there has been improvement in the bilateral pleural effusions and pulmonary edema, there is now a pacemaker in place April 2016 lab work reviewed> surprisingly normal kidney function, hemoglobin 11.1  Assessment & Plan:   83 yo morbidly obese white female with chronic resp failure from severe COPD and CHF, prognosis is very poor.   COPD (chronic obstructive pulmonary disease)-end stage Gold Stage D 1.patient now on NEB therapy 2.continue oxygen 3L  3.albuterol neds every 4 hrs as needed   Hypoxemic respiratory failure, chronic -She should continue 2 L at rest and 3 L with exertion -high chance of recurrent admissions, prognosis is very poor   Congestive heart failure-seems  to be euvolumic state at thistime 1.Continue metoprolol, Lasix 2.follow up Cardiology  Obesity -recommend significant weight loss -recommend changing diet  Deconditioned state exercise  Continue CPAP as prescribed  Follow up in 3 months    Patient/family  satisfied with Plan of action and management. All questions answered Overall prognosis is very poor with chronic resp failure on CPAP  therapy with end stage COPD and morbid obesity   Kathryn Hayes Patricia Pesa, M.D.  [facility]  Medical Director [place] Director [facility] Cardio-Pulmonary Department

## 2017-03-17 NOTE — Patient Instructions (Signed)
Continue CPAP as prescribed Continue NEB therapy as prescribed

## 2017-03-17 NOTE — Telephone Encounter (Signed)
Pt here for appt, necessary test will be ordered per physician descretion.

## 2017-03-19 ENCOUNTER — Other Ambulatory Visit
Admission: RE | Admit: 2017-03-19 | Discharge: 2017-03-19 | Disposition: A | Discharge status: Home / Self Care | Payer: Medicare Other | Source: Skilled Nursing Facility | Attending: Internal Medicine | Admitting: Internal Medicine

## 2017-03-19 DIAGNOSIS — J441 Chronic obstructive pulmonary disease with (acute) exacerbation: Secondary | ICD-10-CM | POA: Diagnosis present

## 2017-03-19 LAB — URINALYSIS, COMPLETE (UACMP) WITH MICROSCOPIC
BACTERIA UA: NONE SEEN
BILIRUBIN URINE: NEGATIVE
Glucose, UA: NEGATIVE mg/dL
HGB URINE DIPSTICK: NEGATIVE
Ketones, ur: NEGATIVE mg/dL
Leukocytes, UA: NEGATIVE
Nitrite: NEGATIVE
PROTEIN: NEGATIVE mg/dL
SPECIFIC GRAVITY, URINE: 1.015 (ref 1.005–1.030)
pH: 5 (ref 5.0–8.0)

## 2017-03-20 LAB — URINE CULTURE

## 2017-03-24 ENCOUNTER — Encounter: Payer: Self-pay | Admitting: Gerontology

## 2017-03-24 ENCOUNTER — Non-Acute Institutional Stay (SKILLED_NURSING_FACILITY): Payer: Medicare Other | Admitting: Gerontology

## 2017-03-24 DIAGNOSIS — J449 Chronic obstructive pulmonary disease, unspecified: Secondary | ICD-10-CM

## 2017-03-24 DIAGNOSIS — R000 Tachycardia, unspecified: Secondary | ICD-10-CM | POA: Diagnosis not present

## 2017-03-24 DIAGNOSIS — G43909 Migraine, unspecified, not intractable, without status migrainosus: Secondary | ICD-10-CM | POA: Diagnosis not present

## 2017-03-24 NOTE — Progress Notes (Signed)
Location:    [facility] Home Room Number: 253G Place of Service:  SNF (31) Provider:  Toni Arthurs, NP-C  Juluis Pitch, MD  Patient Care Team: Juluis Pitch, MD as PCP - General (Family Medicine) Alisa Graff, FNP as Nurse Practitioner (Family Medicine) Ubaldo Glassing Javier Docker, MD as Consulting Physician (Cardiology) Flora Lipps, MD as Consulting Physician (Pulmonary Disease)  Extended Emergency Contact Information Primary Emergency Contact: [name],[name] Address: [place], [place] [address] [name] of [place] Phone: [phone] Mobile Phone: [phone] Relation: Daughter Secondary Emergency Contact: Dove, Gresham Mobile Phone: [phone] Relation: Son  Code Status: Full Goals of care: Advanced Directive information Advanced Directives 02/24/2017  Does Patient Have a Medical Advance Directive? No  Type of Advance Directive -  Does patient want to make changes to medical advance directive? No - Patient declined  Copy of [place] in Chart? -  Would patient like information on creating a medical advance directive? No - Patient declined     Chief Complaint  Patient presents with  . Medical Management of Chronic Issues    Routine Visit    HPI:  Pt is a 83 y.o. female seen today for medical management of chronic diseases.    Migraine without status migrainosus, not intractable Stable.  Patient reports headaches are better than they had been in the past.  The frequency of severe headaches is reduced to average 1 every 2 weeks.  Headaches likely related to chronic mild hypoxia due to COPD.  Symptoms managed with Depakote 250 mg p.o. twice daily and melatonin 10 mg p.o. daily for headache prophylaxis with every 4 hours as needed Tylenol.  Tachycardia Mild.  Average heart rate between 90 and 110.  Most likely related to use of albuterol and steroids.  We will try very low-dose metoprolol 12.5 mg p.o. twice daily for  management of rate.  Patient is typically not very active, mostly bedbound.  She denies resulting symptoms.  Patient denies chest pain or shortness of breath.  Will monitor blood pressure with the addition of the metoprolol.  May need to adjust the trandolapril.  COPD (chronic obstructive pulmonary disease) (HCC) Stabilized.  Patient had a recent extended hospitalization for respiratory failure with hypoxia.  Patient denies chest pain or shortness of breath today.  Lungs clear, but diminished in bases.  Patient continues on O2 3 L per nasal cannula with CPAP at night.  No signs of any respiratory distress.  Symptoms managed with Advair 115-21 mcg 2 puffs twice daily, budesonide 0.5 mg / 2 mL nebulizer twice daily, Combivent Respimat 20-100 mcg/actuation 1 puff 4 times daily, albuterol 0.083% 2.25 mg / 3 mL nebulizer every 6 hours as needed for wheezing or shortness of breath.    Past Medical History:  Diagnosis Date  . Anxiety    unspecified  . Arthritis   . Breast cancer ([place]) 2007   Early stage left breast cancer status post partial colectomy, 04/2005 (Dr. Oliva Bustard)  . Chest pain, unspecified   . CHF (congestive heart failure) ([place])   . Chronic headache   . Colon cancer ([place])    status post partial colectomy, diagnosed 2000  . Colon neoplasm   . COPD (chronic obstructive pulmonary disease) ([place])   . Depression    unspecified  . Diabetes mellitus   . Fibrocystic breast   . History of breast cancer   . History of colon cancer   . HTN (hypertension) with  goal to be determined   . Hyperlipidemia   . Hyperlipidemia, unspecified   . Hypertension   . Obesity, unspecified   . Sleep apnea    mild to moderate   Past Surgical History:  Procedure Laterality Date  . APPENDECTOMY    . BREAST LUMPECTOMY  2007   left  . BUNIONECTOMY Right   . CATARACT EXTRACTION W/ INTRAOCULAR LENS IMPLANT & ANTERIOR VITRECTOMY, BILATERAL Bilateral   . COLON SURGERY  1993  . COLOSTOMY    . DILATION AND  CURETTAGE, DIAGNOSTIC / THERAPEUTIC     Fibroid removal  . INSERT REPLACE REMOVE PACEMAKER     ppm medtronic [id number] Adapta dula chamber rate responsive. 02/21/14  . MASTECTOMY PARTIAL / LUMPECTOMY    . PARTIAL COLECTOMY     Abdominal & Transanal   . TONSILLECTOMY    . TOTAL KNEE ARTHROPLASTY Right 07/03/2013   Right total knee arthroplasty using computer assisted navigation  . TUBAL LIGATION Bilateral 1971   with questionable appendectomy    No Known Allergies  Allergies as of 03/24/2017   No Known Allergies     Medication List        Accurate as of 03/24/17 11:07 AM. Always use your most recent med list.          acetaminophen 325 MG tablet Commonly known as:  TYLENOL Take 650 mg by mouth every 4 (four) hours as needed for mild pain, moderate pain or fever. Do not exceed 3000 mg in 24hrs   albuterol (2.5 MG/3ML) 0.083% nebulizer solution Commonly known as:  PROVENTIL Take 2.5 mg by nebulization every 6 (six) hours as needed for wheezing or shortness of breath.   aspirin EC 81 MG tablet Take 81 mg by mouth daily.   bisacodyl 5 MG EC tablet Commonly known as:  DULCOLAX Take 2 tablets (10 mg total) by mouth daily as needed for moderate constipation.   budesonide 0.5 MG/2ML nebulizer solution Commonly known as:  PULMICORT Take 0.5 mg by nebulization 2 (two) times daily.   Cholecalciferol 2000 units Caps Take 2,000 Units by mouth daily.   COMBIVENT RESPIMAT 20-100 MCG/ACT Aers respimat Generic drug:  Ipratropium-Albuterol Inhale 1 puff into the lungs 4 (four) times daily.   divalproex 125 MG capsule Commonly known as:  DEPAKOTE SPRINKLE Take 250 mg by mouth 2 (two) times daily.   DULoxetine 60 MG capsule Commonly known as:  CYMBALTA Take 60 mg by mouth daily.   eucerin cream Apply liberal amount topically to dry areas of skin daily after bath and as needed. Ok to keep at bedside   feeding supplement (ENSURE ENLIVE) Liqd Take 237 mLs by mouth 2 (two) times  daily between meals.   fluticasone-salmeterol 115-21 MCG/ACT inhaler Commonly known as:  ADVAIR HFA Inhale 2 puffs into the lungs 2 (two) times daily. 8 am and 8 pm   furosemide 20 MG tablet Commonly known as:  LASIX Take 20 mg by mouth daily.   guaiFENesin 600 MG 12 hr tablet Commonly known as:  MUCINEX Take 1 tablet (600 mg total) by mouth 2 (two) times daily.   guaiFENesin-dextromethorphan 100-10 MG/5ML syrup Commonly known as:  ROBITUSSIN DM Take 5 mLs by mouth every 4 (four) hours as needed for cough.   insulin aspart 100 UNIT/ML injection Commonly known as:  novoLOG Inject 3-12 Units into the skin 3 (three) times daily with meals.   ketotifen 0.025 % ophthalmic solution Commonly known as:  ZADITOR Place 1 drop into both eyes 2 (  two) times daily.   LORazepam 0.5 MG tablet Commonly known as:  ATIVAN Take 1 tablet (0.5 mg total) by mouth every 4 (four) hours as needed.   LORazepam 0.5 MG tablet Commonly known as:  ATIVAN Take 1 tablet (0.5 mg total) by mouth at bedtime.   magnesium oxide 400 MG tablet Commonly known as:  MAG-OX Take 400 mg by mouth 2 (two) times daily.   Melatonin 10 MG Tabs Take 10 mg by mouth at bedtime.   Menthol 1 MG Lozg Use as directed 1 lozenge in the mouth or throat every 6 (six) hours as needed. For irritation/pain   nystatin powder Commonly known as:  MYCOSTATIN/NYSTOP Apply 1 application topically to abdominal folds daily for redness and irritation.   pantoprazole 20 MG tablet Commonly known as:  PROTONIX Take 20 mg by mouth daily.   potassium chloride 10 MEQ tablet Commonly known as:  K-DUR,KLOR-CON Take 10 mEq by mouth every other day.   pravastatin 10 MG tablet Commonly known as:  PRAVACHOL Take 10 mg by mouth daily at 6 PM. On Sunday   SENIOR TABS Tabs Take 1 tablet by mouth daily.   senna-docusate 8.6-50 MG tablet Commonly known as:  Senokot-S Take 1 tablet by mouth 2 (two) times daily.   sodium chloride 1 g  tablet Take 1 tablet (1 g total) by mouth 2 (two) times daily with a meal.   trandolapril 2 MG tablet Commonly known as:  MAVIK Take 2 mg by mouth daily.   traZODone 100 MG tablet Commonly known as:  DESYREL Take 1 tablet (100 mg total) by mouth at bedtime.       Review of Systems  Constitutional: Positive for fatigue. Negative for activity change, appetite change, chills, diaphoresis and fever.  HENT: Negative for congestion, mouth sores, nosebleeds, postnasal drip, sneezing, sore throat, trouble swallowing and voice change.   Respiratory: Negative for apnea, cough, choking, chest tightness, shortness of breath and wheezing.   Cardiovascular: Negative for chest pain, palpitations and leg swelling.  Gastrointestinal: Negative for abdominal distention, abdominal pain, constipation, diarrhea and nausea.  Genitourinary: Negative for difficulty urinating, dysuria, frequency and urgency.  Musculoskeletal: Negative for back pain, gait problem and myalgias. Arthralgias: typical arthritis.  Skin: Negative for color change, pallor, rash and wound.  Neurological: Positive for headaches (improved). Negative for dizziness, tremors, syncope, speech difficulty, weakness and numbness.  Psychiatric/Behavioral: Negative for agitation and behavioral problems.  All other systems reviewed and are negative.   Immunization History  Administered Date(s) Administered  . Influenza Split 11/08/2013, 11/21/2014  . Influenza, High Dose Seasonal PF 12/05/2012  . Influenza-Unspecified 08/05/2015, 09/22/2016  . PPD Test 03/05/2014, 10/09/2015, 02/10/2016  . Pneumococcal Polysaccharide-23 05/16/2010  . Pneumococcal-Unspecified 03/05/2014   Pertinent  Health Maintenance Due  Topic Date Due  . FOOT EXAM  03/23/1944  . OPHTHALMOLOGY EXAM  03/23/1944  . DEXA SCAN  03/22/2001  . PNA vac Low Risk Adult (2 of 2 - PCV13) 03/05/2015  . HEMOGLOBIN A1C  09/08/2017  . INFLUENZA VACCINE  Completed   Fall Risk   04/05/2016 12/15/2015 10/07/2015 09/09/2015  Falls in the past year? No No Yes Yes  Number falls in past yr: - - 2 or more 2 or more  Injury with Fall? - - No No  Risk Factor Category  - - - High Fall Risk  Risk for fall due to : - - - History of fall(s)  Follow up - - Education provided;Falls evaluation completed Education provided  Functional Status Survey:    Vitals:   03/24/17 1044  BP: 118/64  Pulse: (!) 109  Resp: 18  Temp: 98 F (36.7 C)  TempSrc: Oral  SpO2: 91%  Weight: 204 lb 3.2 oz (92.6 kg)  Height: 5' 4"  (1.626 m)   Body mass index is 35.05 kg/m. Physical Exam  Constitutional: She is oriented to person, place, and time. Vital signs are normal. She appears well-developed and well-nourished. She appears lethargic. She is active and cooperative. She does not appear ill. No distress. Nasal cannula in place.  HENT:  Head: Normocephalic and atraumatic.  Mouth/Throat: Uvula is midline, oropharynx is clear and moist and mucous membranes are normal. Mucous membranes are not pale, not dry and not cyanotic.  Eyes: Pupils are equal, round, and reactive to light. Conjunctivae, EOM and lids are normal.  Neck: Trachea normal, normal range of motion and full passive range of motion without pain. Neck supple. No JVD present. No tracheal deviation, no edema and no erythema present. No thyromegaly present.  Cardiovascular: Normal heart sounds, intact distal pulses and normal pulses. An irregular rhythm present. Tachycardia present. Exam reveals no gallop, no distant heart sounds and no friction rub.  No murmur heard. Pulses:      Dorsalis pedis pulses are 2+ on the right side, and 2+ on the left side.  No edema  Pulmonary/Chest: Effort normal. No accessory muscle usage. No respiratory distress. She has decreased breath sounds in the right lower field and the left lower field. She has no wheezes. She has no rhonchi. She has no rales. She exhibits no tenderness.  Abdominal: Soft. Normal  appearance and bowel sounds are normal. She exhibits no distension and no ascites. There is no tenderness.  Musculoskeletal: Normal range of motion. She exhibits no edema or tenderness.  Expected osteoarthritis, stiffness; Bilateral Calves soft, supple. Negative Homan's Sign. B- pedal pulses equal; generalized weakness and deconditioning  Neurological: She is oriented to person, place, and time. She has normal strength. She appears lethargic. She displays atrophy. She exhibits abnormal muscle tone. Coordination and gait abnormal.  Skin: Skin is warm, dry and intact. She is not diaphoretic. No cyanosis. No pallor. Nails show no clubbing.  Psychiatric: She has a normal mood and affect. Her speech is normal and behavior is normal. Judgment and thought content normal. Cognition and memory are normal.  Nursing note and vitals reviewed.   Labs reviewed: Recent Labs    02/14/17 2240  02/16/17 1151  03/03/17 0331 03/04/17 0750 03/08/17 0535  NA  --    < > 126*   < > 128* 128* 131*  K  --    < > 4.2   < > 4.2 4.1 4.2  CL  --    < > 81*   < > 87* 87* 91*  CO2  --    < > 33*   < > 31 32 31  GLUCOSE  --    < > 129*   < > 114* 103* 90  BUN  --    < > 12   < > 8 8 6   CREATININE  --    < > 0.42*   < > 0.31* 0.59 0.34*  CALCIUM  --    < > 9.0   < > 8.7* 8.8* 8.6*  MG 1.6*  --  1.7  --   --   --  1.6*   < > = values in this interval not displayed.   Recent Labs  02/14/17 2008 02/22/17 0455 03/08/17 0535  AST 28 22 15   ALT 23 16 17   ALKPHOS 51 38 39  BILITOT 0.7 0.7 0.6  PROT 6.1* 5.2* 5.3*  ALBUMIN 2.8* 2.7* 2.9*   Recent Labs    02/14/17 1923  02/22/17 0455  02/24/17 0344 02/27/17 0748 03/08/17 0535  WBC 11.9*   < > 14.8*   < > 12.4* 7.1 6.9  NEUTROABS 10.8*  --  11.9*  --   --   --  4.7  HGB 12.1   < > 10.4*   < > 11.3* 12.0 10.1*  HCT 36.4   < > 31.8*   < > 35.2 35.7 30.7*  MCV 86.2   < > 87.6   < > 87.8 86.4 87.7  PLT 313   < > 380   < > 357 259 200   < > = values in this  interval not displayed.   Lab Results  Component Value Date   TSH 2.617 03/08/2017   Lab Results  Component Value Date   HGBA1C 6.1 (H) 03/08/2017   Lab Results  Component Value Date   CHOL 139 03/08/2017   HDL 62 03/08/2017   LDLCALC 57 03/08/2017   TRIG 99 03/08/2017   CHOLHDL 2.2 03/08/2017    Significant Diagnostic Results in last 30 days:  Ct Head Wo Contrast  Result Date: 03/01/2017 CLINICAL DATA:  83 year old female with altered level of consciousness. Initial encounter. EXAM: CT HEAD WITHOUT CONTRAST TECHNIQUE: Contiguous axial images were obtained from the base of the skull through the vertex without intravenous contrast. COMPARISON:  06/16/2007 CT. FINDINGS: Brain: No intracranial hemorrhage or CT evidence of large acute infarct. Prominent chronic microvascular changes. Moderate global atrophy. No intracranial mass lesion noted on this unenhanced exam. Vascular: Vascular calcifications. Skull: No acute abnormality Sinuses/Orbits: Mild proptosis unchanged. Prominent opacification right maxillary sinus with air-fluid level. Minimal mucosal thickening left maxillary sinus. Partial opacification sphenoid sinuses bilaterally. Other: Mastoid air cells and middle ear cavities are clear. IMPRESSION: No acute intracranial abnormality. Prominent chronic microvascular changes. Moderate global atrophy. Paranasal sinus opacification most notable right maxillary sinus. Electronically Signed   By: Genia Del M.D.   On: 03/01/2017 13:36   Ct Chest W Contrast  Result Date: 03/02/2017 CLINICAL DATA:  Shortness of breath. EXAM: CT CHEST WITH CONTRAST TECHNIQUE: Multidetector CT imaging of the chest was performed during intravenous contrast administration. CONTRAST:  36m ISOVUE-300 IOPAMIDOL (ISOVUE-300) INJECTION 61% COMPARISON:  CT scan of May 23, 2011. FINDINGS: Cardiovascular: Atherosclerosis of thoracic aorta is noted without aneurysm or dissection. Mild cardiomegaly is noted. No pericardial  effusion is noted. Left-sided pacemaker is noted. Coronary artery calcifications are noted. Enlargement of pulmonary arteries is noted suggesting pulmonary artery hypertension. Mediastinum/Nodes: No mediastinal adenopathy is noted. The esophagus is unremarkable. Patient appears to be status post partial left thyroid resection. Lungs/Pleura: No pneumothorax is noted. Mild bilateral pleural effusions are noted with adjacent subsegmental atelectasis. Upper Abdomen: No acute abnormality. Musculoskeletal: No chest wall abnormality. No acute or significant osseous findings. IMPRESSION: Coronary artery calcifications are noted. Enlargement of pulmonary arteries is noted suggesting pulmonary artery hypertension. Mild bilateral pleural effusions are noted with adjacent subsegmental atelectasis. Aortic Atherosclerosis (ICD10-I70.0). Electronically Signed   By: JMarijo Conception M.D.   On: 03/02/2017 13:12   Dg Chest Port 1 View  Result Date: 02/27/2017 CLINICAL DATA:  Shortness of breath and mental status change EXAM: PORTABLE CHEST 1 VIEW COMPARISON:  February 23, 2017 FINDINGS: Stable pacemaker. The  heart, hila, mediastinum, lungs, and pleura are unchanged. Mild atelectasis in the left base. IMPRESSION: No active disease. Electronically Signed   By: Dorise Bullion III M.D   On: 02/27/2017 07:36   Dg Chest Port 1 View  Result Date: 02/23/2017 CLINICAL DATA:  Increased dyspnea x1 week EXAM: PORTABLE CHEST 1 VIEW COMPARISON:  02/15/2017 FINDINGS: Stable cardiomegaly. Left-sided pacemaker apparatus with right atrial and right ventricular leads are redemonstrated. There is moderate aortic atherosclerosis with uncoiling of the thoracic aorta as before. Chronic left basilar atelectasis. No acute pneumonic consolidation or overt edema. Surgical clips project of left axilla. Osteoarthritis of the [facility] and glenohumeral joints noted bilaterally. IMPRESSION: Stable cardiomegaly with aortic atherosclerosis. No active pulmonary  disease. Left basilar atelectasis. Electronically Signed   By: Ashley Royalty M.D.   On: 02/23/2017 20:17   US Abdomen Limited Ruq  Result Date: 02/28/2017 CLINICAL DATA:  Cirrhosis EXAM: ULTRASOUND ABDOMEN LIMITED RIGHT UPPER QUADRANT COMPARISON:  CT AP 05/16/2017. FINDINGS: Gallbladder: No gallstones or wall thickening visualized. No sonographic Murphy sign noted by sonographer. Common bile duct: Diameter: 4 mm Liver: Increased parenchymal echogenicity. No focal liver abnormality. Portal vein is patent on color Doppler imaging with normal direction of blood flow towards the liver. IMPRESSION: 1. Echogenic liver compatible with hepatic steatosis. 2. No focal liver abnormality identified. Electronically Signed   By: Kerby Moors M.D.   On: 02/28/2017 09:14    Assessment/Plan Brendi was seen today for medical management of chronic issues.  Diagnoses and all orders for this visit:  Migraine without status migrainosus, not intractable, unspecified migraine type  Tachycardia  Chronic obstructive pulmonary disease, unspecified COPD type ([place])   Above listed conditions (migraine and COPD) are stable  Tachycardia is new finding but mild/asymptomatic  Continue current medication regimen  Add metoprolol 12.5 mg p.o. twice daily  Consider adjustments of [place] if patient develops hypotension  Continue O2 3 L per nasal cannula  Continue to encourage patient to be out of the bed into the chair and participating in activities if patient is feeling up to it  Encourage and facilitate rest periods frequently  Patient to continue being followed by palliative medicine  Patient remains a full code at this time  Family/ staff Communication:   Total Time: 45 minutes  Documentation: 10 minutes  Face to Face: 15 minutes  Family/Phone: 20 minutes via telephone to daughter   Labs/tests ordered: Recent labs reviewed: CBC, met C, magnesium, vitamin B12, vitamin D, TSH, lipid panel, free  Depakote level, A1c  Medication list reviewed and assessed for continued appropriateness. Monthly medication orders reviewed and signed.  Vikki Ports, NP-C Geriatrics [facility] [phone], [place] [address] Cell Phone (Mon-Fri 8am-5pm):  [phone] On Call:  [phone] & follow prompts after 5pm & weekends Office Phone:  [phone] Office Fax:  [phone]

## 2017-03-24 NOTE — Assessment & Plan Note (Signed)
Mild.  Average heart rate between 90 and 110.  Most likely related to use of albuterol and steroids.  We will try very low-dose metoprolol 12.5 mg p.o. twice daily for management of rate.  Patient is typically not very active, mostly bedbound.  She denies resulting symptoms.  Patient denies chest pain or shortness of breath.  Will monitor blood pressure with the addition of the metoprolol.  May need to adjust the trandolapril.

## 2017-03-24 NOTE — Assessment & Plan Note (Signed)
Stabilized.  Patient had a recent extended hospitalization for respiratory failure with hypoxia.  Patient denies chest pain or shortness of breath today.  Lungs clear, but diminished in bases.  Patient continues on O2 3 L per nasal cannula with CPAP at night.  No signs of any respiratory distress.  Symptoms managed with Advair 115-21 mcg 2 puffs twice daily, budesonide 0.5 mg / 2 mL nebulizer twice daily, Combivent Respimat 20-100 mcg/actuation 1 puff 4 times daily, albuterol 0.083% 2.25 mg / 3 mL nebulizer every 6 hours as needed for wheezing or shortness of breath.

## 2017-03-24 NOTE — Assessment & Plan Note (Signed)
Stable.  Patient reports headaches are better than they had been in the past.  The frequency of severe headaches is reduced to average 1 every 2 weeks.  Headaches likely related to chronic mild hypoxia due to COPD.  Symptoms managed with Depakote 250 mg p.o. twice daily and melatonin 10 mg p.o. daily for headache prophylaxis with every 4 hours as needed Tylenol.

## 2017-03-30 ENCOUNTER — Other Ambulatory Visit: Payer: Self-pay

## 2017-03-30 MED ORDER — CLONAZEPAM 0.5 MG PO TABS: ORAL_TABLET | ORAL | 1 refills | Status: AC

## 2017-03-30 NOTE — Telephone Encounter (Signed)
Rx sent to [facility] phone : [phone] , fax : [phone]  

## 2017-04-04 ENCOUNTER — Encounter
Admission: RE | Admit: 2017-04-04 | Discharge: 2017-04-04 | Disposition: A | Discharge status: Home / Self Care | Payer: Medicare Other | Source: Ambulatory Visit | Attending: Internal Medicine | Admitting: Internal Medicine

## 2017-04-06 ENCOUNTER — Ambulatory Visit: Payer: Medicare Other

## 2017-04-14 ENCOUNTER — Encounter: Payer: Self-pay | Admitting: Internal Medicine

## 2017-04-22 ENCOUNTER — Ambulatory Visit
Admission: RE | Admit: 2017-04-22 | Discharge: 2017-04-22 | Disposition: A | Discharge status: Home / Self Care | Payer: Medicare Other | Source: Ambulatory Visit | Attending: Internal Medicine | Admitting: Internal Medicine

## 2017-04-22 DIAGNOSIS — R1312 Dysphagia, oropharyngeal phase: Secondary | ICD-10-CM | POA: Diagnosis not present

## 2017-04-22 NOTE — Therapy (Signed)
[place], [place], [address] Phone: [phone]   Fax:     Modified Barium Swallow  Patient Details  Name: Kathryn Hayes MRN: [id number] Date of Birth: [birth year] No data recorded  Encounter Date: 04/22/2017  End of Session - 04/22/17 1338    Visit Number  1    Number of Visits  1    Date for SLP Re-Evaluation  04/22/17       Past Medical History:  Diagnosis Date  . Anxiety    unspecified  . Arthritis   . Breast cancer ([place]) 2007   Early stage left breast cancer status post partial colectomy, 04/2005 (Dr. Oliva Bustard)  . Chest pain, unspecified   . CHF (congestive heart failure) ([place])   . Chronic headache   . Colon cancer ([place])    status post partial colectomy, diagnosed 2000  . Colon neoplasm   . COPD (chronic obstructive pulmonary disease) ([place])   . Depression    unspecified  . Diabetes mellitus   . Fibrocystic breast   . History of breast cancer   . History of colon cancer   . HTN (hypertension) with goal to be determined   . Hyperlipidemia   . Hyperlipidemia, unspecified   . Hypertension   . Obesity, unspecified   . Sleep apnea    mild to moderate    Past Surgical History:  Procedure Laterality Date  . APPENDECTOMY    . BREAST LUMPECTOMY  2007   left  . BUNIONECTOMY Right   . CATARACT EXTRACTION W/ INTRAOCULAR LENS IMPLANT & ANTERIOR VITRECTOMY, BILATERAL Bilateral   . COLON SURGERY  1993  . COLOSTOMY    . DILATION AND CURETTAGE, DIAGNOSTIC / THERAPEUTIC     Fibroid removal  . INSERT REPLACE REMOVE PACEMAKER     ppm medtronic [id number] Adapta dula chamber rate responsive. 02/21/14  . MASTECTOMY PARTIAL / LUMPECTOMY    . PARTIAL COLECTOMY     Abdominal & Transanal   . TONSILLECTOMY    . TOTAL KNEE ARTHROPLASTY Right 07/03/2013   Right total knee arthroplasty using computer assisted navigation  . TUBAL LIGATION Bilateral 1971   with questionable appendectomy    There  were no vitals filed for this visit.   Subjective: Patient behavior: (alertness, ability to follow instructions, etc.):  The patient is able to follow simple directions given cues, she does not appear to be a good historian.  Chief complaint: The patient has history of COPD was at [facility] 02/23/2017 - 03/04/2017 for sepsis.  At that time, it was recommended that the patient eat dyspahgia 3 diet (with minced meats) and have nectar-thick liquids with meals (secondary difficulty coordinating breathing and swallowing).    Objective:  Radiological Procedure: A videoflouroscopic evaluation of oral-preparatory, reflex initiation, and pharyngeal phases of the swallow was performed; as well as a screening of the upper esophageal phase.  I. POSTURE: Upright in MBS chair  II. VIEW: Lateral  III. COMPENSATORY STRATEGIES: Small, single sips  IV. BOLUSES ADMINISTERED:   Thin Liquid: 2 single sips   Nectar-thick Liquid: 1 single sip   Honey-thick Liquid: DNT   Puree: 2 teaspoon presentations   Mechanical Soft: 1/4 graham cracker, crumbled, in applesauce  V. RESULTS OF EVALUATION: A. ORAL PREPARATORY PHASE: (The lips, tongue, and velum are observed for strength and coordination)       **Overall Severity Rating: Moderate; disorganized movements and slow posterior transfer  B. SWALLOW INITIATION/REFLEX: (The reflex is normal  if "triggered" by the time the bolus reached the base of the tongue)  **Overall Severity Rating: Moderate; triggers at the pyriform sinuses with thin liquids  C. PHARYNGEAL PHASE: (Pharyngeal function is normal if the bolus shows rapid, smooth, and continuous transit through the pharynx and there is no pharyngeal residue after the swallow)  **Overall Severity Rating: Within normal limits  D. LARYNGEAL PENETRATION: (Material entering into the laryngeal inlet/vestibule but not aspirated) None  E. ASPIRATION: None  F. ESOPHAGEAL PHASE: (Screening of the upper esophagus) Difficult  to see secondary shoulder shadow.  ASSESSMENT: This 83 year old woman, with COPD and recent hospital admission (02/23/2017 - 03/04/2017), is presenting with moderate oropharyngeal dysphagia characterized by disorganized oral management, slow oral transfer, and delayed pharyngeal swallow initiation. Once the pharyngeal swallow is initiated, aspects of the pharyngeal stage of swallowing including tongue base retraction, hyolaryngeal excursion, epiglottic inversion, and duration/amplitude of UES opening are within normal limits.  There is no observed pharyngeal residue, laryngeal penetration, or tracheal aspiration.    This study supports a dysphagia 3 with minced meats and thin liquids.  However, she remains at risk for aspiration if she becomes breathless/short of breath while eating/drinking.  PLAN/RECOMMENDATIONS:   A. Diet: Dysphagia 3/minced meats with nectar-thick liquids   B. Swallowing Precautions: Avoid breathlessness with eating/drinking   C. Recommended consultation to: N/A   D. Therapy recommendations: Assess for liquid upgrade to thin liquid with meals, patient/staff education RE: safe swallow guidelines   E. Results and recommendations were discussed with the patient immediately following the study and the final report routed to the referring MD and treating SLP.  Patient will benefit from skilled therapeutic intervention in order to improve the following deficits and impairments:   Oropharyngeal dysphagia - Plan: DG OP Swallowing Func-Medicare/Speech Path, DG OP Swallowing Func-Medicare/Speech Path        Problem List Patient Active Problem List   Diagnosis Date Noted  . Tachycardia 03/24/2017  . HCAP (healthcare-associated pneumonia) 02/23/2017  . Sepsis ([place]) 02/23/2017  . Acute respiratory failure with hypoxia ([place]) 02/14/2017  . Dependence on supplemental oxygen 03/25/2016  . Primary central sleep apnea 03/25/2016  . Dependence on other enabling machines and devices  03/25/2016  . Presence of cardiac pacemaker 03/25/2016  . Second degree AV block 03/25/2016  . Major depression, single episode 03/25/2016  . Anxiety disorder 03/25/2016  . Vitamin D deficiency 03/25/2016  . Insomnia 03/25/2016  . GERD (gastroesophageal reflux disease) 03/25/2016  . Syndrome of inappropriate antidiuretic hormone ([place]) 03/25/2016  . Migraine without status migrainosus, not intractable 03/25/2016  . Personal history of other malignant neoplasm of large intestine 03/25/2016  . Personal history of malignant neoplasm of breast 03/25/2016  . HTN (hypertension) 12/16/2015  . Chronic systolic CHF (congestive heart failure) ([place]) 08/16/2015  . 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 Barrett's esophagus 01/05/2012  . Hypoxemic respiratory failure, chronic ([place]) 03/10/2011  . COPD (chronic obstructive pulmonary disease) ([place])   . Hypertensive heart disease with CHF (congestive heart failure) ([place])   . Hyperlipidemia, unspecified   . Type 2 diabetes, controlled, with neuropathy ([place])    Leroy Sea, MS/CCC- SLP  Lou Miner 04/22/2017, 1:41 PM  [place], [place], [address] Phone: [phone]   Fax:     Name: Kathryn Hayes MRN: [id number] Date of Birth: [birth year]

## 2017-04-27 ENCOUNTER — Encounter: Payer: Self-pay | Admitting: Gerontology

## 2017-04-27 ENCOUNTER — Non-Acute Institutional Stay (SKILLED_NURSING_FACILITY): Payer: Medicare Other | Admitting: Gerontology

## 2017-04-27 DIAGNOSIS — K219 Gastro-esophageal reflux disease without esophagitis: Secondary | ICD-10-CM

## 2017-04-27 DIAGNOSIS — E1140 Type 2 diabetes mellitus with diabetic neuropathy, unspecified: Secondary | ICD-10-CM

## 2017-04-27 DIAGNOSIS — I10 Essential (primary) hypertension: Secondary | ICD-10-CM | POA: Diagnosis not present

## 2017-04-27 NOTE — Progress Notes (Signed)
Location:    Nursing Home Room Number: 885O Place of Service:  SNF (31) Provider:  Toni Arthurs, NP-C  Juluis Pitch, MD  Patient Care Team: Juluis Pitch, MD as PCP - General (Family Medicine) Alisa Graff, FNP as Nurse Practitioner (Family Medicine) Ubaldo Glassing Javier Docker, MD as Consulting Physician (Cardiology) Flora Lipps, MD as Consulting Physician (Pulmonary Disease)  Extended Emergency Contact Information Primary Emergency Contact: [name],[name] Address: [place], [place] [address] [name] of [place] Phone: [phone] Mobile Phone: [phone] Relation: Daughter Secondary Emergency Contact: Jadesola, [place] Mobile Phone: [phone] Relation: Son  Code Status:  FULL Goals of care: Advanced Directive information Advanced Directives 04/27/2017  Does Patient Have a Medical Advance Directive? No  Type of Advance Directive -  Does patient want to make changes to medical advance directive? No - Patient declined  Copy of [place] in Chart? -  Would patient like information on creating a medical advance directive? -     Chief Complaint  Patient presents with  . Medical Management of Chronic Issues    Routine Visit    HPI:  Pt is a 83 y.o. female seen today for medical management of chronic diseases.    HTN (hypertension) Stable. No recent episodes of hyper or hypotension. Symptoms controlled on Mavik, metoprolol. Denies chest pain or shortness of breath at this time.   GERD (gastroesophageal reflux disease) Persistent. Pt continues to c/o epigastric pain. Abdominal Xray and Abdominal Ultrasounds negative for acute pathology aside from gallbladder sludge. Pt c/o pain when she eats/ drinks. Having regular BMs. Pt recently started on Reglan as daughter reports she was on this in the past and seemed to help. Too soon to assess full effectiveness now, but so far, it's not helping. Denies esophageal burning, reflux,  cough. MBSS/ BS recently done. Being followed by SLP.   Type 2 diabetes, controlled, with neuropathy (HCC) Stable. Blood sugars well controlled. Being checked AC/HS. Averages in low to mid 100's. No recent episodes of hyper or hypoglycemia. On SSI Novolog only for glycemic control. Recent A1c 6.1.     Past Medical History:  Diagnosis Date  . Anxiety    unspecified  . Arthritis   . Breast cancer ([place]) 2007   Early stage left breast cancer status post partial colectomy, 04/2005 (Dr. Oliva Bustard)  . Chest pain, unspecified   . CHF (congestive heart failure) ([place])   . Chronic headache   . Colon cancer ([place])    status post partial colectomy, diagnosed 2000  . Colon neoplasm   . COPD (chronic obstructive pulmonary disease) ([place])   . Depression    unspecified  . Diabetes mellitus   . Fibrocystic breast   . History of breast cancer   . History of colon cancer   . HTN (hypertension) with goal to be determined   . Hyperlipidemia   . Hyperlipidemia, unspecified   . Hypertension   . Obesity, unspecified   . Sleep apnea    mild to moderate   Past Surgical History:  Procedure Laterality Date  . APPENDECTOMY    . BREAST LUMPECTOMY  2007   left  . BUNIONECTOMY Right   . CATARACT EXTRACTION W/ INTRAOCULAR LENS IMPLANT & ANTERIOR VITRECTOMY, BILATERAL Bilateral   . COLON SURGERY  1993  . COLOSTOMY    . DILATION AND CURETTAGE, DIAGNOSTIC / THERAPEUTIC     Fibroid removal  . INSERT REPLACE REMOVE PACEMAKER  ppm medtronic A2DR01 Adapta dula chamber rate responsive. 02/21/14  . MASTECTOMY PARTIAL / LUMPECTOMY    . PARTIAL COLECTOMY     Abdominal & Transanal   . TONSILLECTOMY    . TOTAL KNEE ARTHROPLASTY Right 07/03/2013   Right total knee arthroplasty using computer assisted navigation  . TUBAL LIGATION Bilateral 1971   with questionable appendectomy    No Known Allergies  Allergies as of 04/27/2017   No Known Allergies     Medication List        Accurate as of 04/27/17 11:59  PM. Always use your most recent med list.          acetaminophen 325 MG tablet Commonly known as:  TYLENOL Take 650 mg by mouth every 4 (four) hours as needed for mild pain, moderate pain or fever. Do not exceed 3000 mg in 24hrs   albuterol (2.5 MG/3ML) 0.083% nebulizer solution Commonly known as:  PROVENTIL Take 2.5 mg by nebulization every 6 (six) hours as needed for wheezing or shortness of breath.   aspirin EC 81 MG tablet Take 81 mg by mouth daily.   bisacodyl 5 MG EC tablet Commonly known as:  DULCOLAX Take 2 tablets (10 mg total) by mouth daily as needed for moderate constipation.   budesonide 0.5 MG/2ML nebulizer solution Commonly known as:  PULMICORT Take 0.5 mg by nebulization 2 (two) times daily.   Cholecalciferol 2000 units Caps Take 2,000 Units by mouth daily.   clonazePAM 0.5 MG tablet Commonly known as:  KLONOPIN Give 1/2 tablet (0.25 mg) in the AM by mouth  and 1 whole tablet (0.5 mg) in the PM by mouth (Insurance doesn't cover Lorazepam)   COMBIVENT RESPIMAT 20-100 MCG/ACT Aers respimat Generic drug:  Ipratropium-Albuterol Inhale 1 puff into the lungs 4 (four) times daily. RINSE MOUTH AFTER USE....Marland KitchenFOR PNEUMONIA AND BRONCHITIS   divalproex 125 MG capsule Commonly known as:  DEPAKOTE SPRINKLE Take 250 mg by mouth 2 (two) times daily.   DULoxetine 60 MG capsule Commonly known as:  CYMBALTA Take 60 mg by mouth daily.   eucerin cream Apply liberal amount topically to dry areas of skin daily after bath and as needed. Ok to keep at bedside   feeding supplement (ENSURE ENLIVE) Liqd Take 237 mLs by mouth 2 (two) times daily between meals.   feeding supplement (PRO-STAT SUGAR FREE 64) Liqd Take 30 mLs by mouth 2 (two) times daily between meals.   fluticasone-salmeterol 115-21 MCG/ACT inhaler Commonly known as:  ADVAIR HFA Inhale 2 puffs into the lungs 2 (two) times daily. 8 am and 8 pm   furosemide 20 MG tablet Commonly known as:  LASIX Take 20 mg by  mouth daily.   gabapentin 400 MG capsule Commonly known as:  NEURONTIN Take 400 mg by mouth 3 (three) times daily.   guaiFENesin 600 MG 12 hr tablet Commonly known as:  MUCINEX Take 1 tablet (600 mg total) by mouth 2 (two) times daily.   guaiFENesin-dextromethorphan 100-10 MG/5ML syrup Commonly known as:  ROBITUSSIN DM Take 5 mLs by mouth every 4 (four) hours as needed for cough.   ibuprofen 600 MG tablet Commonly known as:  ADVIL,MOTRIN Take 600 mg by mouth every 6 (six) hours as needed for headache.   insulin aspart 100 UNIT/ML injection Commonly known as:  novoLOG Inject 3-12 Units into the skin 3 (three) times daily with meals. Per Sliding Scale; If Blood Sugar is 176 to 250, give 3 Units.If Blood Sugar is 251 to 350, give 6  Units.If Blood Sugar is 351 to 450, give 10 Units.  If BS >450, give 12 units and recheck in 1 hour. If still >450, call MD/NP.   ketotifen 0.025 % ophthalmic solution Commonly known as:  ZADITOR Place 1 drop into both eyes 2 (two) times daily.   magnesium oxide 400 MG tablet Commonly known as:  MAG-OX Take 400 mg by mouth 2 (two) times daily.   Melatonin 10 MG Tabs Take 10 mg by mouth at bedtime.   Menthol 1 MG Lozg Use as directed 1 lozenge in the mouth or throat every 6 (six) hours as needed. For irritation/pain   metoCLOPramide 5 MG tablet Commonly known as:  REGLAN Take 5 mg by mouth 4 (four) times daily -  before meals and at bedtime.   metoprolol tartrate 25 MG tablet Commonly known as:  LOPRESSOR Take 12.5 mg by mouth 2 (two) times daily.   nystatin powder Commonly known as:  MYCOSTATIN/NYSTOP Apply 1 application topically to abdominal folds daily for redness and irritation.   pantoprazole 20 MG tablet Commonly known as:  PROTONIX Take 20 mg by mouth daily.   potassium chloride 10 MEQ tablet Commonly known as:  K-DUR,KLOR-CON Take 10 mEq by mouth every other day.   pravastatin 10 MG tablet Commonly known as:  PRAVACHOL Take  10 mg by mouth daily at 6 PM. On Sunday   SENIOR TABS Tabs Take 1 tablet by mouth daily.   senna-docusate 8.6-50 MG tablet Commonly known as:  Senokot-S Take 1 tablet by mouth 2 (two) times daily.   sodium chloride 1 g tablet Take 1 tablet (1 g total) by mouth 2 (two) times daily with a meal.   trandolapril 2 MG tablet Commonly known as:  MAVIK Take 2 mg by mouth daily.   VICKS VAPORUB 4.7-1.2-2.6 % Oint Apply liberal amount topically to fingernails at bedtime for Onychomycosis       Review of Systems  Constitutional: Negative for activity change, appetite change, chills, diaphoresis and fever.  HENT: Negative for congestion, mouth sores, nosebleeds, postnasal drip, sneezing, sore throat, trouble swallowing and voice change.   Respiratory: Negative for apnea, cough, choking, chest tightness, shortness of breath and wheezing.   Cardiovascular: Negative for chest pain, palpitations and leg swelling.  Gastrointestinal: Positive for abdominal pain. Negative for abdominal distention, constipation, diarrhea and nausea.  Genitourinary: Negative for difficulty urinating, dysuria, frequency and urgency.  Musculoskeletal: Positive for arthralgias (typical arthritis). Negative for back pain, gait problem and myalgias.  Skin: Negative for color change, pallor, rash and wound.  Neurological: Positive for weakness. Negative for dizziness, tremors, syncope, speech difficulty, numbness and headaches.  Psychiatric/Behavioral: Negative for agitation and behavioral problems.  All other systems reviewed and are negative.   Immunization History  Administered Date(s) Administered  . Influenza Split 11/08/2013, 11/21/2014  . Influenza, High Dose Seasonal PF 12/05/2012  . Influenza-Unspecified 08/05/2015, 09/22/2016  . PPD Test 03/05/2014, 10/09/2015, 02/10/2016  . Pneumococcal Polysaccharide-23 05/16/2010  . Pneumococcal-Unspecified 03/05/2014   Pertinent  Health Maintenance Due  Topic Date  Due  . FOOT EXAM  03/23/1944  . OPHTHALMOLOGY EXAM  03/23/1944  . DEXA SCAN  03/22/2001  . PNA vac Low Risk Adult (2 of 2 - PCV13) 03/05/2015  . INFLUENZA VACCINE  08/04/2017  . HEMOGLOBIN A1C  09/08/2017   Fall Risk  04/05/2016 12/15/2015 10/07/2015 09/09/2015  Falls in the past year? No No Yes Yes  Number falls in past yr: - - 2 or more 2 or more  Injury with Fall? - - No No  Risk Factor Category  - - - High Fall Risk  Risk for fall due to : - - - History of fall(s)  Follow up - - Education provided;Falls evaluation completed Education provided   Functional Status Survey:    Vitals:   04/27/17 1544  BP: 113/74  Pulse: 88  Resp: 20  Temp: 98.2 F (36.8 C)  TempSrc: Oral  SpO2: 94%  Weight: 205 lb (93 kg)  Height: 5\' 4"  (1.626 m)   Body mass index is 35.19 kg/m. Physical Exam  Constitutional: She is oriented to person, place, and time. Vital signs are normal. She appears well-developed and well-nourished. She is active and cooperative. She does not appear ill. No distress. Nasal cannula in place.  HENT:  Head: Normocephalic and atraumatic.  Mouth/Throat: Uvula is midline, oropharynx is clear and moist and mucous membranes are normal. Mucous membranes are not pale, not dry and not cyanotic.  Eyes: Pupils are equal, round, and reactive to light. Conjunctivae, EOM and lids are normal.  Neck: Trachea normal, normal range of motion and full passive range of motion without pain. Neck supple. No JVD present. No tracheal deviation, no edema and no erythema present. No thyromegaly present.  Cardiovascular: Normal rate, regular rhythm, normal heart sounds, intact distal pulses and normal pulses. Exam reveals no gallop, no distant heart sounds and no friction rub.  No murmur heard. Pulses:      Dorsalis pedis pulses are 2+ on the right side, and 2+ on the left side.  No edema  Pulmonary/Chest: Effort normal and breath sounds normal. No accessory muscle usage. No respiratory distress. She  has no decreased breath sounds. She has no wheezes. She has no rhonchi. She has no rales. She exhibits no tenderness.  Abdominal: Soft. Normal appearance and bowel sounds are normal. She exhibits no distension and no ascites. There is tenderness (pain with palpation, po intake) in the epigastric area.  Musculoskeletal: Normal range of motion. She exhibits no edema or tenderness.  Expected osteoarthritis, stiffness; Bilateral Calves soft, supple. Negative Homan's Sign. B- pedal pulses equal; generalized weakness, bedbound, mobile with total lift  Neurological: She is alert and oriented to person, place, and time. She has normal strength. She displays atrophy. She exhibits abnormal muscle tone. Coordination and gait abnormal.  Skin: Skin is warm, dry and intact. She is not diaphoretic. No cyanosis. No pallor. Nails show no clubbing.  Psychiatric: She has a normal mood and affect. Her speech is normal and behavior is normal. Judgment and thought content normal. Cognition and memory are impaired. She exhibits abnormal recent memory.  Nursing note and vitals reviewed.   Labs reviewed: Recent Labs    02/14/17 2240  02/16/17 1151  03/03/17 0331 03/04/17 0750 03/08/17 0535  NA  --    < > 126*   < > 128* 128* 131*  K  --    < > 4.2   < > 4.2 4.1 4.2  CL  --    < > 81*   < > 87* 87* 91*  CO2  --    < > 33*   < > 31 32 31  GLUCOSE  --    < > 129*   < > 114* 103* 90  BUN  --    < > 12   < > 8 8 6   CREATININE  --    < > 0.42*   < > 0.31* 0.59 0.34*  CALCIUM  --    < >  9.0   < > 8.7* 8.8* 8.6*  MG 1.6*  --  1.7  --   --   --  1.6*   < > = values in this interval not displayed.   Recent Labs    02/14/17 2008 02/22/17 0455 03/08/17 0535  AST 28 22 15   ALT 23 16 17   ALKPHOS 51 38 39  BILITOT 0.7 0.7 0.6  PROT 6.1* 5.2* 5.3*  ALBUMIN 2.8* 2.7* 2.9*   Recent Labs    02/14/17 1923  02/22/17 0455  02/24/17 0344 02/27/17 0748 03/08/17 0535  WBC 11.9*   < > 14.8*   < > 12.4* 7.1 6.9   NEUTROABS 10.8*  --  11.9*  --   --   --  4.7  HGB 12.1   < > 10.4*   < > 11.3* 12.0 10.1*  HCT 36.4   < > 31.8*   < > 35.2 35.7 30.7*  MCV 86.2   < > 87.6   < > 87.8 86.4 87.7  PLT 313   < > 380   < > 357 259 200   < > = values in this interval not displayed.   Lab Results  Component Value Date   TSH 2.617 03/08/2017   Lab Results  Component Value Date   HGBA1C 6.1 (H) 03/08/2017   Lab Results  Component Value Date   CHOL 139 03/08/2017   HDL 62 03/08/2017   LDLCALC 57 03/08/2017   TRIG 99 03/08/2017   CHOLHDL 2.2 03/08/2017    Significant Diagnostic Results in last 30 days:  Dg Op Swallowing Func-medicare/speech Path  Result Date: 04/22/2017 CLINICAL DATA:  Silent aspiration. EXAM: MODIFIED BARIUM SWALLOW TECHNIQUE: Different consistencies of barium were administered orally to the patient by the Speech Pathologist. Imaging of the pharynx was performed in the lateral projection. FLUOROSCOPY TIME:  Fluoroscopy Time:  2 minutes 0 seconds Radiation Exposure Index (if provided by the fluoroscopic device): 44.7 mGy Number of Acquired Spot Images: 1 COMPARISON:  CT 03/02/2017. FINDINGS: Thin liquid- within normal limits Pure- within normal limits Pure with cracker- within normal limits IMPRESSION: Normal exam. Please refer to the Speech Pathologists report for complete details and recommendations. Electronically Signed   By: Marcello Moores  Register   On: 04/22/2017 13:02    Assessment/Plan Ligia was seen today for medical management of chronic issues.  Diagnoses and all orders for this visit:  Gastroesophageal reflux disease without esophagitis  Essential hypertension  Type 2 diabetes, controlled, with neuropathy ([place])   Above listed conditions stable, except GERD  Continue current medication regimen, except  Add Sucralfate 1 Gram PO QID  Continue to be followed by SLP for GERD  Add use of Sippy-Cups for PO fluid intake to decrease the r/f aspiration and air  ingestion  Continue to monitor FSBS  Monitor for hyper or hypoglycemia  Monitor for hyper or hypotension  Safety precautions  Fall precautions   Continue to be followed by Palliative Care  Family/ staff Communication:   Total Time:  Documentation:  Face to Face:  Family/Phone:   Labs/tests ordered:  Not due  Medication list reviewed and assessed for continued appropriateness. Monthly medication orders reviewed and signed.  Vikki Ports, NP-C [facility] [phone], [place] [address] Cell Phone (Mon-Fri 8am-5pm):  [phone] On Call:  [phone] & follow prompts after 5pm & weekends Office Phone:  [phone] Office Fax:  [phone]

## 2017-04-29 NOTE — Assessment & Plan Note (Signed)
Stable. Blood sugars well controlled. Being checked AC/HS. Averages in low to mid 100's. No recent episodes of hyper or hypoglycemia. On SSI Novolog only for glycemic control. Recent A1c 6.1.

## 2017-04-29 NOTE — Assessment & Plan Note (Signed)
Persistent. Pt continues to c/o epigastric pain. Abdominal Xray and Abdominal Ultrasounds negative for acute pathology aside from gallbladder sludge. Pt c/o pain when she eats/ drinks. Having regular BMs. Pt recently started on Reglan as daughter reports she was on this in the past and seemed to help. Too soon to assess full effectiveness now, but so far, it's not helping. Denies esophageal burning, reflux, cough. MBSS/ BS recently done. Being followed by SLP.

## 2017-04-29 NOTE — Assessment & Plan Note (Signed)
Stable. No recent episodes of hyper or hypotension. Symptoms controlled on Mavik, metoprolol. Denies chest pain or shortness of breath at this time.

## 2017-05-02 ENCOUNTER — Other Ambulatory Visit
Admission: RE | Admit: 2017-05-02 | Discharge: 2017-05-02 | Disposition: A | Discharge status: Home / Self Care | Payer: Medicare Other | Source: Skilled Nursing Facility | Attending: Gerontology | Admitting: Gerontology

## 2017-05-02 ENCOUNTER — Non-Acute Institutional Stay (SKILLED_NURSING_FACILITY): Payer: Medicare Other | Admitting: Gerontology

## 2017-05-02 ENCOUNTER — Emergency Department: Payer: Medicare Other

## 2017-05-02 ENCOUNTER — Other Ambulatory Visit: Payer: Self-pay

## 2017-05-02 ENCOUNTER — Emergency Department
Admission: EM | Admit: 2017-05-02 | Discharge: 2017-05-02 | Disposition: A | Discharge status: Skilled Nursing Facility | Payer: Medicare Other | Attending: Emergency Medicine | Admitting: Emergency Medicine

## 2017-05-02 DIAGNOSIS — Z794 Long term (current) use of insulin: Secondary | ICD-10-CM | POA: Insufficient documentation

## 2017-05-02 DIAGNOSIS — J189 Pneumonia, unspecified organism: Secondary | ICD-10-CM | POA: Insufficient documentation

## 2017-05-02 DIAGNOSIS — I110 Hypertensive heart disease with heart failure: Secondary | ICD-10-CM | POA: Insufficient documentation

## 2017-05-02 DIAGNOSIS — Z87891 Personal history of nicotine dependence: Secondary | ICD-10-CM | POA: Insufficient documentation

## 2017-05-02 DIAGNOSIS — I5022 Chronic systolic (congestive) heart failure: Secondary | ICD-10-CM | POA: Insufficient documentation

## 2017-05-02 DIAGNOSIS — J441 Chronic obstructive pulmonary disease with (acute) exacerbation: Secondary | ICD-10-CM

## 2017-05-02 DIAGNOSIS — J449 Chronic obstructive pulmonary disease, unspecified: Secondary | ICD-10-CM | POA: Diagnosis not present

## 2017-05-02 DIAGNOSIS — R0602 Shortness of breath: Secondary | ICD-10-CM | POA: Diagnosis present

## 2017-05-02 DIAGNOSIS — R400 Somnolence: Secondary | ICD-10-CM | POA: Diagnosis not present

## 2017-05-02 DIAGNOSIS — Z79899 Other long term (current) drug therapy: Secondary | ICD-10-CM | POA: Insufficient documentation

## 2017-05-02 DIAGNOSIS — E1140 Type 2 diabetes mellitus with diabetic neuropathy, unspecified: Secondary | ICD-10-CM | POA: Insufficient documentation

## 2017-05-02 LAB — CBC WITH DIFFERENTIAL/PLATELET
BASOS ABS: 0 10*3/uL (ref 0–0.1)
BASOS PCT: 0 %
Basophils Absolute: 0 10*3/uL (ref 0–0.1)
Basophils Relative: 0 %
EOS ABS: 0 10*3/uL (ref 0–0.7)
EOS PCT: 0 %
Eosinophils Absolute: 0 10*3/uL (ref 0–0.7)
Eosinophils Relative: 0 %
HCT: 36.5 % (ref 35.0–47.0)
HEMATOCRIT: 35 % (ref 35.0–47.0)
Hemoglobin: 11.4 g/dL — ABNORMAL LOW (ref 12.0–16.0)
Hemoglobin: 12.1 g/dL (ref 12.0–16.0)
LYMPHS ABS: 1.1 10*3/uL (ref 1.0–3.6)
LYMPHS PCT: 9 %
Lymphocytes Relative: 6 %
Lymphs Abs: 0.9 10*3/uL — ABNORMAL LOW (ref 1.0–3.6)
MCH: 29.4 pg (ref 26.0–34.0)
MCH: 29.8 pg (ref 26.0–34.0)
MCHC: 32.7 g/dL (ref 32.0–36.0)
MCHC: 33.2 g/dL (ref 32.0–36.0)
MCV: 89.8 fL (ref 80.0–100.0)
MCV: 89.8 fL (ref 80.0–100.0)
MONO ABS: 0.2 10*3/uL (ref 0.2–0.9)
MONO ABS: 0.3 10*3/uL (ref 0.2–0.9)
MONOS PCT: 1 %
Monocytes Relative: 2 %
Neutro Abs: 13.3 10*3/uL — ABNORMAL HIGH (ref 1.4–6.5)
Neutro Abs: 11.3 10*3/uL — ABNORMAL HIGH (ref 1.4–6.5)
Neutrophils Relative %: 93 %
Neutrophils Relative %: 89 %
PLATELETS: 297 10*3/uL (ref 150–440)
PLATELETS: 334 10*3/uL (ref 150–440)
RBC: 3.9 MIL/uL (ref 3.80–5.20)
RBC: 4.06 MIL/uL (ref 3.80–5.20)
RDW: 16.9 % — AB (ref 11.5–14.5)
RDW: 16.9 % — AB (ref 11.5–14.5)
WBC: 14.4 10*3/uL — ABNORMAL HIGH (ref 3.6–11.0)
WBC: 12.6 10*3/uL — ABNORMAL HIGH (ref 3.6–11.0)

## 2017-05-02 LAB — COMPREHENSIVE METABOLIC PANEL
ALT: 13 U/L — ABNORMAL LOW (ref 14–54)
ALT: 12 U/L — ABNORMAL LOW (ref 14–54)
ANION GAP: 8 (ref 5–15)
AST: 19 U/L (ref 15–41)
AST: 24 U/L (ref 15–41)
Albumin: 1.9 g/dL — ABNORMAL LOW (ref 3.5–5.0)
Albumin: 2.1 g/dL — ABNORMAL LOW (ref 3.5–5.0)
Alkaline Phosphatase: 107 U/L (ref 38–126)
Alkaline Phosphatase: 116 U/L (ref 38–126)
Anion gap: 9 (ref 5–15)
BILIRUBIN TOTAL: 0.4 mg/dL (ref 0.3–1.2)
BUN: 17 mg/dL (ref 6–20)
BUN: 18 mg/dL (ref 6–20)
CHLORIDE: 87 mmol/L — AB (ref 101–111)
CHLORIDE: 85 mmol/L — AB (ref 101–111)
CO2: 36 mmol/L — AB (ref 22–32)
CO2: 38 mmol/L — ABNORMAL HIGH (ref 22–32)
Calcium: 8.2 mg/dL — ABNORMAL LOW (ref 8.9–10.3)
Calcium: 8.5 mg/dL — ABNORMAL LOW (ref 8.9–10.3)
Creatinine, Ser: 0.5 mg/dL (ref 0.44–1.00)
Creatinine, Ser: 0.55 mg/dL (ref 0.44–1.00)
GFR calc Af Amer: >60 mL/min (ref >60)
GFR calc non Af Amer: >60 mL/min (ref >60)
Glucose, Bld: 159 mg/dL — ABNORMAL HIGH (ref 65–99)
Glucose, Bld: 148 mg/dL — ABNORMAL HIGH (ref 65–99)
POTASSIUM: 5.1 mmol/L (ref 3.5–5.1)
Potassium: 5 mmol/L (ref 3.5–5.1)
SODIUM: 131 mmol/L — AB (ref 135–145)
Sodium: 132 mmol/L — ABNORMAL LOW (ref 135–145)
TOTAL PROTEIN: 5.1 g/dL — AB (ref 6.5–8.1)
TOTAL PROTEIN: 5.5 g/dL — AB (ref 6.5–8.1)
Total Bilirubin: 0.4 mg/dL (ref 0.3–1.2)

## 2017-05-02 LAB — TROPONIN I

## 2017-05-02 MED ORDER — AZITHROMYCIN 500 MG PO TABS
500.0000 mg | ORAL_TABLET | Freq: Once | ORAL | Status: AC
  Administered 2017-05-02: 500 mg via ORAL
  Filled 2017-05-02: qty 1

## 2017-05-02 MED ORDER — AZITHROMYCIN 250 MG PO TABS: 250.0000 mg | ORAL_TABLET | Freq: Every day | ORAL | 0 refills | Status: AC

## 2017-05-02 NOTE — ED Notes (Signed)
Unable to get IV on this pt - charge nurse notified and he will attempt ultrasound guided IV

## 2017-05-02 NOTE — ED Notes (Signed)
IV team in room  

## 2017-05-02 NOTE — ED Notes (Signed)
Secretary notified to call ems for transport back to [facility]

## 2017-05-02 NOTE — ED Notes (Signed)
Charge nurse attempted IV with ultrasound without success - IV team consult placed

## 2017-05-02 NOTE — ED Provider Notes (Signed)
[facility] Emergency Department Provider Note  Time seen: 4:53 PM  I have reviewed the triage vital signs and the nursing notes.   HISTORY  Chief Complaint Shortness of Breath    HPI Kathryn Hayes is a 83 y.o. female with a past medical history of CHF, COPD, diabetes, hypertension, hyperlipidemia, presents to the emergency department for respiratory concerns.  According to report per family patient was at her nursing facility and they noted that she appeared to be breathing more slowly than normal so they sent her to the emergency department for evaluation.  Here the patient appears well, has no complaints.  Denies any shortness of breath cough or chest pain.  Patient typically wears 3 L of oxygen 24/7, currently satting in the mid 90s on 3 L of oxygen.  Normal respiratory rate around 15 to 18 breaths/min currently.  Patient is nonambulatory at baseline per family.  Patient is able to give a good history.  Patient takes Klonopin, took a tablet at 745 this morning per [facility], does not take any narcotic medications.  Largely negative review of systems.   Past Medical History:  Diagnosis Date  . Anxiety    unspecified  . Arthritis   . Breast cancer ([place]) 2007   Early stage left breast cancer status post partial colectomy, 04/2005 (Dr. Oliva Bustard)  . Chest pain, unspecified   . CHF (congestive heart failure) ([place])   . Chronic headache   . Colon cancer ([place])    status post partial colectomy, diagnosed 2000  . Colon neoplasm   . COPD (chronic obstructive pulmonary disease) ([place])   . Depression    unspecified  . Diabetes mellitus   . Fibrocystic breast   . History of breast cancer   . History of colon cancer   . HTN (hypertension) with goal to be determined   . Hyperlipidemia   . Hyperlipidemia, unspecified   . Hypertension   . Obesity, unspecified   . Sleep apnea    mild to moderate    Patient Active Problem List   Diagnosis Date Noted  . Tachycardia  03/24/2017  . Sepsis ([place]) 02/23/2017  . Dependence on supplemental oxygen 03/25/2016  . Primary central sleep apnea 03/25/2016  . Dependence on other enabling machines and devices 03/25/2016  . Presence of cardiac pacemaker 03/25/2016  . Second degree AV block 03/25/2016  . Major depression, single episode 03/25/2016  . Anxiety disorder 03/25/2016  . Vitamin D deficiency 03/25/2016  . Insomnia 03/25/2016  . GERD (gastroesophageal reflux disease) 03/25/2016  . Syndrome of inappropriate antidiuretic hormone ([place]) 03/25/2016  . Migraine without status migrainosus, not intractable 03/25/2016  . Personal history of other malignant neoplasm of large intestine 03/25/2016  . Personal history of malignant neoplasm of breast 03/25/2016  . HTN (hypertension) 12/16/2015  . Chronic systolic CHF (congestive heart failure) ([place]) 08/16/2015  . 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 Barrett's esophagus 01/05/2012  . COPD (chronic obstructive pulmonary disease) ([place])   . Hypertensive heart disease with CHF (congestive heart failure) ([employer])   . Hyperlipidemia, unspecified   . Type 2 diabetes, controlled, with neuropathy [facility])     Past Surgical History:  Procedure Laterality Date  . APPENDECTOMY    . BREAST LUMPECTOMY  2007   left  . BUNIONECTOMY Right   . CATARACT EXTRACTION W/ INTRAOCULAR LENS IMPLANT & ANTERIOR VITRECTOMY, BILATERAL Bilateral   . COLON SURGERY  1993  . COLOSTOMY    . DILATION AND CURETTAGE, DIAGNOSTIC / THERAPEUTIC  Fibroid removal  . INSERT REPLACE REMOVE PACEMAKER     ppm medtronic [id number] Adapta dula chamber rate responsive. 02/21/14  . MASTECTOMY PARTIAL / LUMPECTOMY    . PARTIAL COLECTOMY     Abdominal & Transanal   . TONSILLECTOMY    . TOTAL KNEE ARTHROPLASTY Right 07/03/2013   Right total knee arthroplasty using computer assisted navigation  . TUBAL LIGATION Bilateral 1971   with questionable appendectomy    Prior to Admission medications   Medication Sig Start  Date End Date Taking? Authorizing Provider  acetaminophen (TYLENOL) 325 MG tablet Take 650 mg by mouth every 4 (four) hours as needed for mild pain, moderate pain or fever. Do not exceed 3000 mg in 24hrs     [provider]  albuterol (PROVENTIL) (2.5 MG/3ML) 0.083% nebulizer solution Take 2.5 mg by nebulization every 6 (six) hours as needed for wheezing or shortness of breath.     [provider]  Amino Acids-Protein Hydrolys (FEEDING SUPPLEMENT, PRO-STAT SUGAR FREE 64,) LIQD Take 30 mLs by mouth 2 (two) times daily between meals.    [provider]  aspirin EC 81 MG tablet Take 81 mg by mouth daily.     [provider]  bisacodyl (DULCOLAX) 5 MG EC tablet Take 2 tablets (10 mg total) by mouth daily as needed for moderate constipation. 02/18/17   Demetrios Loll, MD  budesonide (PULMICORT) 0.5 MG/2ML nebulizer solution Take 0.5 mg by nebulization 2 (two) times daily.     [provider]  Camphor-Eucalyptus-Menthol (VICKS VAPORUB) 4.7-1.2-2.6 % OINT Apply liberal amount topically to fingernails at bedtime for Onychomycosis    [provider]  Cholecalciferol 2000 units CAPS Take 2,000 Units by mouth daily.    [provider]  clonazePAM (KLONOPIN) 0.5 MG tablet Give 1/2 tablet (0.25 mg) in the AM by mouth  and 1 whole tablet (0.5 mg) in the PM by mouth (Insurance doesn't cover Lorazepam) 03/30/17   Toni Arthurs, NP  divalproex (DEPAKOTE SPRINKLE) 125 MG capsule Take 250 mg by mouth 2 (two) times daily.     [provider]  DULoxetine (CYMBALTA) 60 MG capsule Take 60 mg by mouth daily.     [provider]  feeding supplement, ENSURE ENLIVE, (ENSURE ENLIVE) LIQD Take 237 mLs by mouth 2 (two) times daily between meals. 03/04/17   Gladstone Lighter, MD  fluticasone-salmeterol (ADVAIR HFA) 440-10 MCG/ACT inhaler Inhale 2 puffs into the lungs 2 (two) times daily. 8 am and 8 pm     [provider]  furosemide (LASIX) 20 MG  tablet Take 20 mg by mouth daily.     [provider]  gabapentin (NEURONTIN) 400 MG capsule Take 400 mg by mouth 3 (three) times daily.    [provider]  guaiFENesin (MUCINEX) 600 MG 12 hr tablet Take 1 tablet (600 mg total) by mouth 2 (two) times daily. 03/04/17   Gladstone Lighter, MD  guaiFENesin-dextromethorphan (ROBITUSSIN DM) 100-10 MG/5ML syrup Take 5 mLs by mouth every 4 (four) hours as needed for cough. 02/18/17   Demetrios Loll, MD  ibuprofen (ADVIL,MOTRIN) 600 MG tablet Take 600 mg by mouth every 6 (six) hours as needed for headache.    [provider]  insulin aspart (NOVOLOG) 100 UNIT/ML injection Inject 3-12 Units into the skin 3 (three) times daily with meals. Per Sliding Scale; If Blood Sugar is 176 to 250, give 3 Units.If Blood Sugar is 251 to 350, give 6 Units.If Blood Sugar is 351 to 450, give  10 Units.  If BS >450, give 12 units and recheck in 1 hour. If still >450, call MD/NP.    [provider]  Ipratropium-Albuterol (COMBIVENT RESPIMAT) 20-100 MCG/ACT AERS respimat Inhale 1 puff into the lungs 4 (four) times daily. RINSE MOUTH AFTER USE....Marland KitchenFOR PNEUMONIA AND BRONCHITIS    [provider]  ketotifen (ZADITOR) 0.025 % ophthalmic solution Place 1 drop into both eyes 2 (two) times daily.     [provider]  magnesium oxide (MAG-OX) 400 MG tablet Take 400 mg by mouth 2 (two) times daily.     [provider]  Melatonin 10 MG TABS Take 10 mg by mouth at bedtime.     [provider]  Menthol 1 MG LOZG Use as directed 1 lozenge in the mouth or throat every 6 (six) hours as needed. For irritation/pain    [provider]  metoCLOPramide (REGLAN) 5 MG tablet Take 5 mg by mouth 4 (four) times daily -  before meals and at bedtime.    [provider]  metoprolol tartrate (LOPRESSOR) 25 MG tablet Take 12.5 mg by mouth 2 (two) times daily.    [provider]  Multiple Vitamins-Minerals (SENIOR TABS)  TABS Take 1 tablet by mouth daily.    [provider]  nystatin (MYCOSTATIN/NYSTOP) powder Apply 1 application topically to abdominal folds daily for redness and irritation.    [provider]  pantoprazole (PROTONIX) 20 MG tablet Take 20 mg by mouth daily.    [provider]  potassium chloride (K-DUR,KLOR-CON) 10 MEQ tablet Take 10 mEq by mouth every other day.     [provider]  pravastatin (PRAVACHOL) 10 MG tablet Take 10 mg by mouth daily at 6 PM. On Sunday    [provider]  senna-docusate (SENOKOT-S) 8.6-50 MG tablet Take 1 tablet by mouth 2 (two) times daily.     [provider]  Skin Protectants, Misc. (EUCERIN) cream Apply liberal amount topically to dry areas of skin daily after bath and as needed. Ok to keep at bedside    [provider]  sodium chloride 1 g tablet Take 1 tablet (1 g total) by mouth 2 (two) times daily with a meal. 03/04/17   Gladstone Lighter, MD  trandolapril (MAVIK) 2 MG tablet Take 2 mg by mouth daily.    [provider]    No Known Allergies  Family History  Problem Relation Age of Onset  . Factor V Leiden deficiency Sister   . Colon polyps Sister   . Breast cancer Mother   . Colon cancer Mother   . Alzheimer's disease Mother   . Prostate cancer Father     Social History Social History   Tobacco Use  . Smoking status: Former Smoker    Packs/day: 1.50    Years: 50.00    Pack years: 75.00    Types: Cigarettes    Last attempt to quit: 10/05/2010    Years since quitting: 6.5  . Smokeless tobacco: Never Used  Substance Use Topics  . Alcohol use: No    Alcohol/week: 1.0 oz    Types: 2 Standard drinks or equivalent per week    Comment: Occasionally  . Drug use: No    Review of Systems Constitutional: Negative for fever. Eyes: Negative for visual complaints ENT: Negative for recent illness/congestion Cardiovascular: Negative for chest pain. Respiratory: Negative for  shortness of breath. Gastrointestinal: Negative for abdominal pain, vomiting Genitourinary: Negative for urinary compaints Musculoskeletal: Negative for leg pain  or swelling. Skin: Negative for skin complaints  Neurological: Negative for headache All other ROS negative  ____________________________________________   PHYSICAL EXAM:  VITAL SIGNS: ED Triage Vitals  Enc Vitals Group     BP 05/02/17 1619 99/66     Pulse Rate 05/02/17 1619 88     Resp 05/02/17 1619 16     Temp 05/02/17 1619 98.1 F (36.7 C)     Temp Source 05/02/17 1619 Oral     SpO2 05/02/17 1617 91 %     Weight 05/02/17 1620 200 lb (90.7 kg)     Height 05/02/17 1620 4\' 11"  (1.499 m)     Head Circumference --      Peak Flow --      Pain Score 05/02/17 1620 0     Pain Loc --      Pain Edu? --      Excl. in [place]? --    Constitutional: Alert and oriented. Well appearing and in no distress. Eyes: Normal exam ENT   Head: Normocephalic and atraumatic.   Mouth/Throat: Mucous membranes are moist. Cardiovascular: Normal rate, regular rhythm. Respiratory: Normal respiratory effort without tachypnea nor retractions. Breath sounds are clear  Gastrointestinal: Soft and nontender. No distention.  Musculoskeletal: Nontender with normal range of motion in all extremities. No lower extremity tenderness.  No significant edema. Neurologic:  Normal speech and language. No gross focal neurologic deficits Skin:  Skin is warm, dry and intact.  Psychiatric: Mood and affect are normal.   ____________________________________________    EKG  EKG reviewed and interpreted by myself shows ventricular paced rhythm at 93 bpm with a widened QRS, left axis deviation, nonspecific ST changes.  ____________________________________________    RADIOLOGY  Tray shows atelectasis versus consolidation in lower lobes.  ____________________________________________   INITIAL IMPRESSION / ASSESSMENT AND PLAN / ED COURSE  Pertinent  labs & imaging results that were available during my care of the patient were reviewed by me and considered in my medical decision making (see chart for details).  Patient presents to the emergency department for concerns of decreased respiratory rate.  Currently the patient appears very well, no distress, no complaints, largely normal vitals.  Currently 96% on 3 L during my evaluation.  Differential includes medication reaction, pneumonia, ACS, URI, metabolic/letter light abnormality.  We will check labs, chest x-ray, troponin and continue to closely monitor.  Patient's EKG shows a ventricular paced rhythm with no acute abnormalities.  Chest x-ray shows atelectasis versus consolidation in lower lobes.  As a precaution we will cover with antibiotics.  Overall the patient continues to appear well.  We will discharge the patient back to nursing facility.  Family agreeable to this plan of care.  ____________________________________________   FINAL CLINICAL IMPRESSION(S) / ED DIAGNOSES  Dyspnea pneumonia    Harvest Dark, MD 05/02/17 1934

## 2017-05-02 NOTE — ED Triage Notes (Signed)
Pt arrived via ems - she denies any pain or shortness of breath - the family went to visit her at the nursing home and they stated that she was breathing to slow and they wanted her to be evaluated

## 2017-05-04 ENCOUNTER — Inpatient Hospital Stay: Payer: Medicare Other

## 2017-05-04 ENCOUNTER — Emergency Department: Payer: Medicare Other

## 2017-05-04 ENCOUNTER — Inpatient Hospital Stay
Admission: EM | Admit: 2017-05-04 | Discharge: 2017-06-04 | DRG: 871 | Disposition: E | Payer: Medicare Other | Attending: Internal Medicine | Admitting: Internal Medicine

## 2017-05-04 ENCOUNTER — Encounter
Admission: RE | Admit: 2017-05-04 | Discharge: 2017-05-04 | Disposition: A | Discharge status: Home / Self Care | Payer: Medicare Other | Source: Ambulatory Visit | Attending: Internal Medicine | Admitting: Internal Medicine

## 2017-05-04 ENCOUNTER — Inpatient Hospital Stay: Payer: Self-pay

## 2017-05-04 ENCOUNTER — Other Ambulatory Visit: Payer: Self-pay

## 2017-05-04 ENCOUNTER — Encounter: Payer: Self-pay | Admitting: Emergency Medicine

## 2017-05-04 DIAGNOSIS — T182XXA Foreign body in stomach, initial encounter: Secondary | ICD-10-CM | POA: Diagnosis present

## 2017-05-04 DIAGNOSIS — G9341 Metabolic encephalopathy: Secondary | ICD-10-CM | POA: Diagnosis present

## 2017-05-04 DIAGNOSIS — E785 Hyperlipidemia, unspecified: Secondary | ICD-10-CM | POA: Diagnosis present

## 2017-05-04 DIAGNOSIS — I110 Hypertensive heart disease with heart failure: Secondary | ICD-10-CM | POA: Diagnosis present

## 2017-05-04 DIAGNOSIS — I272 Pulmonary hypertension, unspecified: Secondary | ICD-10-CM | POA: Diagnosis present

## 2017-05-04 DIAGNOSIS — Z800 Family history of malignant neoplasm of digestive organs: Secondary | ICD-10-CM

## 2017-05-04 DIAGNOSIS — K920 Hematemesis: Secondary | ICD-10-CM | POA: Diagnosis present

## 2017-05-04 DIAGNOSIS — I5032 Chronic diastolic (congestive) heart failure: Secondary | ICD-10-CM | POA: Diagnosis present

## 2017-05-04 DIAGNOSIS — Z7951 Long term (current) use of inhaled steroids: Secondary | ICD-10-CM

## 2017-05-04 DIAGNOSIS — Z79899 Other long term (current) drug therapy: Secondary | ICD-10-CM

## 2017-05-04 DIAGNOSIS — R042 Hemoptysis: Secondary | ICD-10-CM | POA: Diagnosis present

## 2017-05-04 DIAGNOSIS — K559 Vascular disorder of intestine, unspecified: Secondary | ICD-10-CM | POA: Diagnosis present

## 2017-05-04 DIAGNOSIS — I509 Heart failure, unspecified: Secondary | ICD-10-CM

## 2017-05-04 DIAGNOSIS — Z950 Presence of cardiac pacemaker: Secondary | ICD-10-CM

## 2017-05-04 DIAGNOSIS — N170 Acute kidney failure with tubular necrosis: Secondary | ICD-10-CM | POA: Diagnosis not present

## 2017-05-04 DIAGNOSIS — R578 Other shock: Secondary | ICD-10-CM | POA: Diagnosis present

## 2017-05-04 DIAGNOSIS — I4891 Unspecified atrial fibrillation: Secondary | ICD-10-CM | POA: Diagnosis present

## 2017-05-04 DIAGNOSIS — X58XXXA Exposure to other specified factors, initial encounter: Secondary | ICD-10-CM | POA: Diagnosis present

## 2017-05-04 DIAGNOSIS — K3184 Gastroparesis: Secondary | ICD-10-CM | POA: Diagnosis present

## 2017-05-04 DIAGNOSIS — J189 Pneumonia, unspecified organism: Secondary | ICD-10-CM

## 2017-05-04 DIAGNOSIS — J449 Chronic obstructive pulmonary disease, unspecified: Secondary | ICD-10-CM | POA: Diagnosis present

## 2017-05-04 DIAGNOSIS — E1143 Type 2 diabetes mellitus with diabetic autonomic (poly)neuropathy: Secondary | ICD-10-CM | POA: Diagnosis present

## 2017-05-04 DIAGNOSIS — Z8371 Family history of colonic polyps: Secondary | ICD-10-CM

## 2017-05-04 DIAGNOSIS — J9601 Acute respiratory failure with hypoxia: Secondary | ICD-10-CM | POA: Diagnosis not present

## 2017-05-04 DIAGNOSIS — Z515 Encounter for palliative care: Secondary | ICD-10-CM | POA: Diagnosis not present

## 2017-05-04 DIAGNOSIS — I2510 Atherosclerotic heart disease of native coronary artery without angina pectoris: Secondary | ICD-10-CM | POA: Diagnosis present

## 2017-05-04 DIAGNOSIS — E11649 Type 2 diabetes mellitus with hypoglycemia without coma: Secondary | ICD-10-CM | POA: Diagnosis present

## 2017-05-04 DIAGNOSIS — E872 Acidosis, unspecified: Secondary | ICD-10-CM

## 2017-05-04 DIAGNOSIS — R6521 Severe sepsis with septic shock: Secondary | ICD-10-CM | POA: Diagnosis present

## 2017-05-04 DIAGNOSIS — R4182 Altered mental status, unspecified: Secondary | ICD-10-CM | POA: Diagnosis not present

## 2017-05-04 DIAGNOSIS — R34 Anuria and oliguria: Secondary | ICD-10-CM | POA: Diagnosis not present

## 2017-05-04 DIAGNOSIS — R1084 Generalized abdominal pain: Secondary | ICD-10-CM | POA: Diagnosis present

## 2017-05-04 DIAGNOSIS — Z96651 Presence of right artificial knee joint: Secondary | ICD-10-CM | POA: Diagnosis present

## 2017-05-04 DIAGNOSIS — Z6841 Body Mass Index (BMI) 40.0 and over, adult: Secondary | ICD-10-CM | POA: Diagnosis not present

## 2017-05-04 DIAGNOSIS — Z794 Long term (current) use of insulin: Secondary | ICD-10-CM

## 2017-05-04 DIAGNOSIS — Z9981 Dependence on supplemental oxygen: Secondary | ICD-10-CM

## 2017-05-04 DIAGNOSIS — E871 Hypo-osmolality and hyponatremia: Secondary | ICD-10-CM | POA: Diagnosis present

## 2017-05-04 DIAGNOSIS — J9621 Acute and chronic respiratory failure with hypoxia: Secondary | ICD-10-CM | POA: Diagnosis present

## 2017-05-04 DIAGNOSIS — F419 Anxiety disorder, unspecified: Secondary | ICD-10-CM | POA: Diagnosis present

## 2017-05-04 DIAGNOSIS — F329 Major depressive disorder, single episode, unspecified: Secondary | ICD-10-CM | POA: Diagnosis present

## 2017-05-04 DIAGNOSIS — Z66 Do not resuscitate: Secondary | ICD-10-CM | POA: Diagnosis present

## 2017-05-04 DIAGNOSIS — Z9842 Cataract extraction status, left eye: Secondary | ICD-10-CM

## 2017-05-04 DIAGNOSIS — R000 Tachycardia, unspecified: Secondary | ICD-10-CM | POA: Diagnosis present

## 2017-05-04 DIAGNOSIS — R413 Other amnesia: Secondary | ICD-10-CM | POA: Diagnosis present

## 2017-05-04 DIAGNOSIS — Z8042 Family history of malignant neoplasm of prostate: Secondary | ICD-10-CM

## 2017-05-04 DIAGNOSIS — A419 Sepsis, unspecified organism: Principal | ICD-10-CM

## 2017-05-04 DIAGNOSIS — N3090 Cystitis, unspecified without hematuria: Secondary | ICD-10-CM | POA: Diagnosis present

## 2017-05-04 DIAGNOSIS — Z85038 Personal history of other malignant neoplasm of large intestine: Secondary | ICD-10-CM

## 2017-05-04 DIAGNOSIS — G4733 Obstructive sleep apnea (adult) (pediatric): Secondary | ICD-10-CM | POA: Diagnosis present

## 2017-05-04 DIAGNOSIS — Z87891 Personal history of nicotine dependence: Secondary | ICD-10-CM

## 2017-05-04 DIAGNOSIS — M1990 Unspecified osteoarthritis, unspecified site: Secondary | ICD-10-CM | POA: Diagnosis present

## 2017-05-04 DIAGNOSIS — Z01818 Encounter for other preprocedural examination: Secondary | ICD-10-CM

## 2017-05-04 DIAGNOSIS — Z7982 Long term (current) use of aspirin: Secondary | ICD-10-CM

## 2017-05-04 DIAGNOSIS — R627 Adult failure to thrive: Secondary | ICD-10-CM | POA: Diagnosis present

## 2017-05-04 DIAGNOSIS — Z803 Family history of malignant neoplasm of breast: Secondary | ICD-10-CM

## 2017-05-04 DIAGNOSIS — Z820 Family history of epilepsy and other diseases of the nervous system: Secondary | ICD-10-CM

## 2017-05-04 DIAGNOSIS — R109 Unspecified abdominal pain: Secondary | ICD-10-CM

## 2017-05-04 DIAGNOSIS — Z993 Dependence on wheelchair: Secondary | ICD-10-CM

## 2017-05-04 DIAGNOSIS — Z9010 Acquired absence of unspecified breast and nipple: Secondary | ICD-10-CM

## 2017-05-04 DIAGNOSIS — Z853 Personal history of malignant neoplasm of breast: Secondary | ICD-10-CM

## 2017-05-04 DIAGNOSIS — G8929 Other chronic pain: Secondary | ICD-10-CM | POA: Diagnosis present

## 2017-05-04 DIAGNOSIS — Z9841 Cataract extraction status, right eye: Secondary | ICD-10-CM

## 2017-05-04 DIAGNOSIS — Z961 Presence of intraocular lens: Secondary | ICD-10-CM | POA: Diagnosis present

## 2017-05-04 DIAGNOSIS — Z933 Colostomy status: Secondary | ICD-10-CM

## 2017-05-04 DIAGNOSIS — E876 Hypokalemia: Secondary | ICD-10-CM | POA: Diagnosis present

## 2017-05-04 DIAGNOSIS — Z9049 Acquired absence of other specified parts of digestive tract: Secondary | ICD-10-CM

## 2017-05-04 LAB — URINALYSIS, COMPLETE (UACMP) WITH MICROSCOPIC
Bilirubin Urine: NEGATIVE
Glucose, UA: NEGATIVE mg/dL
Hgb urine dipstick: NEGATIVE
Ketones, ur: NEGATIVE mg/dL
Nitrite: NEGATIVE
PH: 5 (ref 5.0–8.0)
Protein, ur: 30 mg/dL — AB
SPECIFIC GRAVITY, URINE: 1.03 (ref 1.005–1.030)
WBC, UA: >50 WBC/hpf — ABNORMAL HIGH (ref 0–5)

## 2017-05-04 LAB — CBC WITH DIFFERENTIAL/PLATELET
BAND NEUTROPHILS: 1 %
BASOS ABS: 0 10*3/uL (ref 0–0.1)
BLASTS: 0 %
Basophils Relative: 0 %
EOS ABS: 0 10*3/uL (ref 0–0.7)
Eosinophils Relative: 0 %
HEMATOCRIT: 31.8 % — AB (ref 35.0–47.0)
Hemoglobin: 10.6 g/dL — ABNORMAL LOW (ref 12.0–16.0)
Lymphocytes Relative: 12 %
Lymphs Abs: 2.2 10*3/uL (ref 1.0–3.6)
MCH: 30.5 pg (ref 26.0–34.0)
MCHC: 33.5 g/dL (ref 32.0–36.0)
MCV: 91.2 fL (ref 80.0–100.0)
METAMYELOCYTES PCT: 0 %
MYELOCYTES: 1 %
Monocytes Absolute: 1.5 10*3/uL — ABNORMAL HIGH (ref 0.2–0.9)
Monocytes Relative: 8 %
NEUTROS ABS: 14.7 10*3/uL — AB (ref 1.4–6.5)
Neutrophils Relative %: 78 %
Other: 0 %
PROMYELOCYTES RELATIVE: 0 %
Platelets: 353 10*3/uL (ref 150–440)
RBC: 3.48 MIL/uL — AB (ref 3.80–5.20)
RDW: 17.2 % — AB (ref 11.5–14.5)
WBC: 18.4 10*3/uL — AB (ref 3.6–11.0)
nRBC: 0 /100 WBC

## 2017-05-04 LAB — GLUCOSE, CAPILLARY
GLUCOSE-CAPILLARY: 76 mg/dL (ref 65–99)
GLUCOSE-CAPILLARY: 33 mg/dL — AB (ref 65–99)
GLUCOSE-CAPILLARY: 590 mg/dL — AB (ref 65–99)
Glucose-Capillary: 54 mg/dL — ABNORMAL LOW (ref 65–99)
Glucose-Capillary: 55 mg/dL — ABNORMAL LOW (ref 65–99)
Glucose-Capillary: 360 mg/dL — ABNORMAL HIGH (ref 65–99)
Glucose-Capillary: 496 mg/dL — ABNORMAL HIGH (ref 65–99)
Glucose-Capillary: >600 mg/dL (ref 65–99)

## 2017-05-04 LAB — LACTIC ACID, PLASMA
LACTIC ACID, VENOUS: 7.5 mmol/L — AB (ref 0.5–1.9)
LACTIC ACID, VENOUS: 10.5 mmol/L — AB (ref 0.5–1.9)
Lactic Acid, Venous: 7.8 mmol/L (ref 0.5–1.9)

## 2017-05-04 LAB — COMPREHENSIVE METABOLIC PANEL
ALBUMIN: 1.7 g/dL — AB (ref 3.5–5.0)
ALK PHOS: 78 U/L (ref 38–126)
ALK PHOS: 85 U/L (ref 38–126)
ALT: 17 U/L (ref 14–54)
ALT: 1958 U/L — ABNORMAL HIGH (ref 14–54)
ANION GAP: 12 (ref 5–15)
ANION GAP: 19 — AB (ref 5–15)
AST: 71 U/L — ABNORMAL HIGH (ref 15–41)
AST: 4879 U/L — ABNORMAL HIGH (ref 15–41)
Albumin: 1.3 g/dL — ABNORMAL LOW (ref 3.5–5.0)
BILIRUBIN TOTAL: 0.6 mg/dL (ref 0.3–1.2)
BUN: 16 mg/dL (ref 6–20)
BUN: 17 mg/dL (ref 6–20)
CALCIUM: 7.1 mg/dL — AB (ref 8.9–10.3)
CALCIUM: 6.2 mg/dL — AB (ref 8.9–10.3)
CO2: 28 mmol/L (ref 22–32)
CO2: 22 mmol/L (ref 22–32)
Chloride: 95 mmol/L — ABNORMAL LOW (ref 101–111)
Chloride: 91 mmol/L — ABNORMAL LOW (ref 101–111)
Creatinine, Ser: 0.62 mg/dL (ref 0.44–1.00)
Creatinine, Ser: 1.15 mg/dL — ABNORMAL HIGH (ref 0.44–1.00)
GFR calc non Af Amer: >60 mL/min (ref >60)
GFR, EST AFRICAN AMERICAN: 50 mL/min — AB (ref >60)
GFR, EST NON AFRICAN AMERICAN: 43 mL/min — AB (ref >60)
GLUCOSE: 136 mg/dL — AB (ref 65–99)
Glucose, Bld: 795 mg/dL (ref 65–99)
POTASSIUM: 5.1 mmol/L (ref 3.5–5.1)
Potassium: 4.2 mmol/L (ref 3.5–5.1)
SODIUM: 132 mmol/L — AB (ref 135–145)
Sodium: 135 mmol/L (ref 135–145)
TOTAL PROTEIN: 4.3 g/dL — AB (ref 6.5–8.1)
Total Bilirubin: 0.6 mg/dL (ref 0.3–1.2)
Total Protein: 3.4 g/dL — ABNORMAL LOW (ref 6.5–8.1)

## 2017-05-04 LAB — BLOOD GAS, ARTERIAL
ACID-BASE DEFICIT: 6 mmol/L — AB (ref 0.0–2.0)
Bicarbonate: 21.5 mmol/L (ref 20.0–28.0)
FIO2: 1
LHR: 15 {breaths}/min
MECHVT: 500 mL
O2 SAT: 99.8 %
PCO2 ART: 49 mmHg — AB (ref 32.0–48.0)
PEEP: 5 cmH2O
PO2 ART: 236 mmHg — AB (ref 83.0–108.0)
Patient temperature: 37
pH, Arterial: 7.25 — ABNORMAL LOW (ref 7.350–7.450)

## 2017-05-04 LAB — BASIC METABOLIC PANEL
ANION GAP: 18 — AB (ref 5–15)
BUN: 18 mg/dL (ref 6–20)
CHLORIDE: 92 mmol/L — AB (ref 101–111)
CO2: 22 mmol/L (ref 22–32)
CREATININE: 1.17 mg/dL — AB (ref 0.44–1.00)
Calcium: 6.3 mg/dL — CL (ref 8.9–10.3)
GFR calc Af Amer: 49 mL/min — ABNORMAL LOW (ref >60)
GFR calc non Af Amer: 43 mL/min — ABNORMAL LOW (ref >60)
Glucose, Bld: 804 mg/dL (ref 65–99)
POTASSIUM: 4.2 mmol/L (ref 3.5–5.1)
SODIUM: 132 mmol/L — AB (ref 135–145)

## 2017-05-04 LAB — TROPONIN I

## 2017-05-04 LAB — MAGNESIUM
MAGNESIUM: 1.6 mg/dL — AB (ref 1.7–2.4)
MAGNESIUM: 1.7 mg/dL (ref 1.7–2.4)

## 2017-05-04 LAB — HEMOGLOBIN AND HEMATOCRIT, BLOOD
HEMATOCRIT: 23.5 % — AB (ref 35.0–47.0)
Hemoglobin: 7.3 g/dL — ABNORMAL LOW (ref 12.0–16.0)

## 2017-05-04 LAB — ABO/RH: ABO/RH(D): A POS

## 2017-05-04 LAB — PROTIME-INR
INR: 1.25
Prothrombin Time: 15.6 seconds — ABNORMAL HIGH (ref 11.4–15.2)

## 2017-05-04 LAB — PHOSPHORUS
PHOSPHORUS: 4.9 mg/dL — AB (ref 2.5–4.6)
Phosphorus: 5 mg/dL — ABNORMAL HIGH (ref 2.5–4.6)

## 2017-05-04 LAB — BRAIN NATRIURETIC PEPTIDE: B NATRIURETIC PEPTIDE 5: 235 pg/mL — AB (ref 0.0–100.0)

## 2017-05-04 LAB — MRSA PCR SCREENING: MRSA by PCR: NEGATIVE

## 2017-05-04 LAB — PREPARE RBC (CROSSMATCH)

## 2017-05-04 LAB — HEMOGLOBIN A1C
HEMOGLOBIN A1C: 5.8 % — AB (ref 4.8–5.6)
MEAN PLASMA GLUCOSE: 119.76 mg/dL

## 2017-05-04 LAB — LIPASE, BLOOD: LIPASE: 50 U/L (ref 11–51)

## 2017-05-04 MED ORDER — MELATONIN 5 MG PO TABS
10.0000 mg | ORAL_TABLET | Freq: Every day | ORAL | Status: DC
  Filled 2017-05-04: qty 2

## 2017-05-04 MED ORDER — DEXTROSE 10 % IV SOLN
INTRAVENOUS | Status: DC
  Administered 2017-05-04 (×2): via INTRAVENOUS

## 2017-05-04 MED ORDER — INSULIN ASPART 100 UNIT/ML ~~LOC~~ SOLN: 0.0000 [IU] | SUBCUTANEOUS | Status: DC

## 2017-05-04 MED ORDER — SODIUM BICARBONATE 8.4 % IV SOLN: 100.0000 meq | Freq: Once | INTRAVENOUS | Status: DC

## 2017-05-04 MED ORDER — ONDANSETRON HCL 4 MG/2ML IJ SOLN: 4.0000 mg | Freq: Four times a day (QID) | INTRAMUSCULAR | Status: DC | PRN

## 2017-05-04 MED ORDER — DEXTROSE 50 % IV SOLN
1.0000 | Freq: Once | INTRAVENOUS | Status: DC
  Filled 2017-05-04 (×3): qty 50

## 2017-05-04 MED ORDER — MOMETASONE FURO-FORMOTEROL FUM 200-5 MCG/ACT IN AERO
2.0000 | INHALATION_SPRAY | Freq: Two times a day (BID) | RESPIRATORY_TRACT | Status: DC
  Filled 2017-05-04: qty 8.8

## 2017-05-04 MED ORDER — DEXTROSE 50 % IV SOLN: 100.0000 mL | Freq: Once | INTRAVENOUS | Status: DC

## 2017-05-04 MED ORDER — HYDROCORTISONE NA SUCCINATE PF 100 MG IJ SOLR
100.0000 mg | Freq: Three times a day (TID) | INTRAMUSCULAR | Status: DC
  Administered 2017-05-04 – 2017-05-05 (×3): 100 mg via INTRAVENOUS
  Filled 2017-05-04 (×2): qty 2

## 2017-05-04 MED ORDER — DEXTROSE 50 % IV SOLN
INTRAVENOUS | Status: AC
  Filled 2017-05-04: qty 50

## 2017-05-04 MED ORDER — POLYETHYLENE GLYCOL 3350 17 G PO PACK: 17.0000 g | PACK | Freq: Every day | ORAL | Status: DC | PRN

## 2017-05-04 MED ORDER — DIVALPROEX SODIUM 125 MG PO CSDR
250.0000 mg | DELAYED_RELEASE_CAPSULE | Freq: Two times a day (BID) | ORAL | Status: DC
  Administered 2017-05-05 (×2): 250 mg via ORAL
  Filled 2017-05-04 (×6): qty 2

## 2017-05-04 MED ORDER — DEXTROSE 50 % IV SOLN: 1.0000 | Freq: Once | INTRAVENOUS | Status: DC

## 2017-05-04 MED ORDER — STERILE WATER FOR INJECTION IV SOLN
INTRAVENOUS | Status: DC
  Administered 2017-05-04 – 2017-05-05 (×3): via INTRAVENOUS
  Filled 2017-05-04 (×6): qty 850

## 2017-05-04 MED ORDER — AMIODARONE LOAD VIA INFUSION
150.0000 mg | Freq: Once | INTRAVENOUS | Status: AC
  Administered 2017-05-04: 150 mg via INTRAVENOUS

## 2017-05-04 MED ORDER — PANTOPRAZOLE SODIUM 40 MG PO TBEC: 40.0000 mg | DELAYED_RELEASE_TABLET | Freq: Two times a day (BID) | ORAL | Status: DC

## 2017-05-04 MED ORDER — SODIUM CHLORIDE 0.45 % IV SOLN: INTRAVENOUS | Status: DC

## 2017-05-04 MED ORDER — ONDANSETRON HCL 4 MG PO TABS: 4.0000 mg | ORAL_TABLET | Freq: Four times a day (QID) | ORAL | Status: DC | PRN

## 2017-05-04 MED ORDER — PRO-STAT SUGAR FREE PO LIQD
30.0000 mL | Freq: Two times a day (BID) | ORAL | Status: DC
  Administered 2017-05-05 (×2): 30 mL via ORAL

## 2017-05-04 MED ORDER — ACETAMINOPHEN 325 MG PO TABS: 650.0000 mg | ORAL_TABLET | Freq: Four times a day (QID) | ORAL | Status: DC | PRN

## 2017-05-04 MED ORDER — VANCOMYCIN HCL IN DEXTROSE 1-5 GM/200ML-% IV SOLN
1000.0000 mg | Freq: Once | INTRAVENOUS | Status: AC
  Administered 2017-05-04: 1000 mg via INTRAVENOUS

## 2017-05-04 MED ORDER — NOREPINEPHRINE BITARTRATE 1 MG/ML IV SOLN
0.0000 ug/min | INTRAVENOUS | Status: DC
  Administered 2017-05-04: 40 ug/min via INTRAVENOUS
  Administered 2017-05-04 – 2017-05-05 (×3): 60 ug/min via INTRAVENOUS
  Administered 2017-05-05: 45 ug/min via INTRAVENOUS
  Administered 2017-05-05: 35 ug/min via INTRAVENOUS
  Filled 2017-05-04 (×8): qty 16

## 2017-05-04 MED ORDER — DULOXETINE HCL 30 MG PO CPEP: 60.0000 mg | ORAL_CAPSULE | Freq: Every day | ORAL | Status: DC

## 2017-05-04 MED ORDER — STERILE WATER FOR INJECTION IJ SOLN
INTRAMUSCULAR | Status: AC
  Filled 2017-05-04: qty 10

## 2017-05-04 MED ORDER — PIPERACILLIN-TAZOBACTAM 3.375 G IVPB 30 MIN
3.3750 g | Freq: Once | INTRAVENOUS | Status: AC
  Administered 2017-05-04: 3.375 g via INTRAVENOUS

## 2017-05-04 MED ORDER — MAGNESIUM OXIDE 400 (241.3 MG) MG PO TABS
400.0000 mg | ORAL_TABLET | Freq: Two times a day (BID) | ORAL | Status: DC
  Administered 2017-05-05 (×2): 400 mg via ORAL
  Filled 2017-05-04 (×2): qty 1

## 2017-05-04 MED ORDER — PANTOPRAZOLE SODIUM 40 MG IV SOLR: 40.0000 mg | Freq: Once | INTRAVENOUS | Status: DC

## 2017-05-04 MED ORDER — GABAPENTIN 400 MG PO CAPS
400.0000 mg | ORAL_CAPSULE | Freq: Three times a day (TID) | ORAL | Status: DC
  Administered 2017-05-05 (×3): 400 mg via ORAL
  Filled 2017-05-04 (×3): qty 1

## 2017-05-04 MED ORDER — SODIUM CHLORIDE 0.9 % IV SOLN
Freq: Once | INTRAVENOUS | Status: AC
  Administered 2017-05-04: 19:00:00 via INTRAVENOUS

## 2017-05-04 MED ORDER — PIPERACILLIN-TAZOBACTAM 3.375 G IVPB
3.3750 g | Freq: Three times a day (TID) | INTRAVENOUS | Status: DC
  Administered 2017-05-05 – 2017-05-06 (×6): 3.375 g via INTRAVENOUS
  Filled 2017-05-04 (×6): qty 50

## 2017-05-04 MED ORDER — AMIODARONE HCL IN DEXTROSE 360-4.14 MG/200ML-% IV SOLN
60.0000 mg/h | INTRAVENOUS | Status: AC
  Administered 2017-05-04 (×2): 60 mg/h via INTRAVENOUS
  Filled 2017-05-04: qty 200

## 2017-05-04 MED ORDER — VANCOMYCIN HCL IN DEXTROSE 1-5 GM/200ML-% IV SOLN
INTRAVENOUS | Status: AC
  Administered 2017-05-04: 1000 mg via INTRAVENOUS
  Filled 2017-05-04: qty 200

## 2017-05-04 MED ORDER — OCTREOTIDE ACETATE 100 MCG/ML IJ SOLN
50.0000 ug | Freq: Four times a day (QID) | INTRAMUSCULAR | Status: DC
  Administered 2017-05-04 – 2017-05-05 (×2): 50 ug via SUBCUTANEOUS
  Filled 2017-05-04 (×4): qty 0.5

## 2017-05-04 MED ORDER — METOCLOPRAMIDE HCL 5 MG PO TABS
5.0000 mg | ORAL_TABLET | Freq: Three times a day (TID) | ORAL | Status: DC
  Administered 2017-05-05: 5 mg via ORAL
  Filled 2017-05-04 (×4): qty 1

## 2017-05-04 MED ORDER — SODIUM BICARBONATE 8.4 % IV SOLN: 50.0000 meq | Freq: Once | INTRAVENOUS | Status: DC

## 2017-05-04 MED ORDER — FENTANYL 2500MCG IN NS 250ML (10MCG/ML) PREMIX INFUSION
0.0000 ug/h | INTRAVENOUS | Status: DC
  Administered 2017-05-04: 100 ug/h via INTRAVENOUS
  Administered 2017-05-05: 250 ug/h via INTRAVENOUS
  Filled 2017-05-04 (×2): qty 250

## 2017-05-04 MED ORDER — SODIUM CHLORIDE 0.9 % IV SOLN
0.0000 ug/min | INTRAVENOUS | Status: DC
  Administered 2017-05-04: 300 ug/min via INTRAVENOUS
  Filled 2017-05-04: qty 10

## 2017-05-04 MED ORDER — PIPERACILLIN-TAZOBACTAM 3.375 G IVPB
INTRAVENOUS | Status: AC
  Filled 2017-05-04: qty 50

## 2017-05-04 MED ORDER — GUAIFENESIN-DM 100-10 MG/5ML PO SYRP
5.0000 mL | ORAL_SOLUTION | ORAL | Status: DC | PRN
  Filled 2017-05-04: qty 5

## 2017-05-04 MED ORDER — PANTOPRAZOLE SODIUM 40 MG IV SOLR: 40.0000 mg | Freq: Two times a day (BID) | INTRAVENOUS | Status: DC

## 2017-05-04 MED ORDER — SODIUM CHLORIDE 0.9 % IV SOLN
INTRAVENOUS | Status: DC
  Administered 2017-05-04 – 2017-05-05 (×2): via INTRAVENOUS

## 2017-05-04 MED ORDER — SODIUM CHLORIDE 0.9 % IV SOLN
0.0000 ug/min | INTRAVENOUS | Status: DC
  Administered 2017-05-04 – 2017-05-05 (×9): 400 ug/min via INTRAVENOUS
  Administered 2017-05-05: 150 ug/min via INTRAVENOUS
  Administered 2017-05-05 (×6): 400 ug/min via INTRAVENOUS
  Administered 2017-05-06: 200 ug/min via INTRAVENOUS
  Administered 2017-05-06: 350 ug/min via INTRAVENOUS
  Administered 2017-05-06 (×2): 325 ug/min via INTRAVENOUS
  Administered 2017-05-06: 400 ug/min via INTRAVENOUS
  Filled 2017-05-04 (×13): qty 40
  Filled 2017-05-04 (×2): qty 4
  Filled 2017-05-04: qty 40
  Filled 2017-05-04 (×2): qty 4
  Filled 2017-05-04: qty 40
  Filled 2017-05-04: qty 4
  Filled 2017-05-04: qty 40
  Filled 2017-05-04: qty 4
  Filled 2017-05-04: qty 40

## 2017-05-04 MED ORDER — KETOTIFEN FUMARATE 0.025 % OP SOLN
1.0000 [drp] | Freq: Two times a day (BID) | OPHTHALMIC | Status: DC
  Administered 2017-05-05 – 2017-05-06 (×3): 1 [drp] via OPHTHALMIC
  Filled 2017-05-04 (×2): qty 5

## 2017-05-04 MED ORDER — AMIODARONE HCL IN DEXTROSE 360-4.14 MG/200ML-% IV SOLN
30.0000 mg/h | INTRAVENOUS | Status: DC
Start: 2017-05-05 — End: 2017-05-06
  Administered 2017-05-05 (×2): 30 mg/h via INTRAVENOUS
  Filled 2017-05-04 (×3): qty 200

## 2017-05-04 MED ORDER — BUDESONIDE 0.5 MG/2ML IN SUSP
0.5000 mg | Freq: Two times a day (BID) | RESPIRATORY_TRACT | Status: DC
  Administered 2017-05-04 – 2017-05-06 (×4): 0.5 mg via RESPIRATORY_TRACT
  Filled 2017-05-04 (×5): qty 2

## 2017-05-04 MED ORDER — EPINEPHRINE PF 1 MG/ML IJ SOLN
0.5000 ug/min | INTRAMUSCULAR | Status: DC
  Administered 2017-05-04: 5 ug/min via INTRAVENOUS
  Filled 2017-05-04 (×2): qty 4

## 2017-05-04 MED ORDER — NYSTATIN 100000 UNIT/GM EX POWD
Freq: Every day | CUTANEOUS | Status: DC
  Administered 2017-05-05 – 2017-05-06 (×2): via TOPICAL
  Filled 2017-05-04 (×2): qty 15

## 2017-05-04 MED ORDER — IOPAMIDOL (ISOVUE-370) INJECTION 76%
75.0000 mL | Freq: Once | INTRAVENOUS | Status: AC | PRN
  Administered 2017-05-04: 75 mL via INTRAVENOUS

## 2017-05-04 MED ORDER — SENNOSIDES-DOCUSATE SODIUM 8.6-50 MG PO TABS: 1.0000 | ORAL_TABLET | Freq: Two times a day (BID) | ORAL | Status: DC

## 2017-05-04 MED ORDER — NOREPINEPHRINE 4 MG/250ML-% IV SOLN
0.0000 ug/min | INTRAVENOUS | Status: DC
  Filled 2017-05-04: qty 250

## 2017-05-04 MED ORDER — MENTHOL 3 MG MT LOZG
1.0000 | LOZENGE | Freq: Four times a day (QID) | OROMUCOSAL | Status: DC | PRN
  Filled 2017-05-04: qty 9

## 2017-05-04 MED ORDER — DEXTROSE 10 % IV SOLN: INTRAVENOUS | Status: DC

## 2017-05-04 MED ORDER — ALBUTEROL SULFATE (2.5 MG/3ML) 0.083% IN NEBU: 2.5000 mg | INHALATION_SOLUTION | Freq: Four times a day (QID) | RESPIRATORY_TRACT | Status: DC | PRN

## 2017-05-04 MED ORDER — VANCOMYCIN HCL 10 G IV SOLR
1250.0000 mg | INTRAVENOUS | Status: DC
  Administered 2017-05-05: 1250 mg via INTRAVENOUS
  Filled 2017-05-04 (×3): qty 1250

## 2017-05-04 MED ORDER — SODIUM CHLORIDE 0.9 % IV SOLN
8.0000 mg/h | INTRAVENOUS | Status: DC
  Administered 2017-05-04 – 2017-05-06 (×4): 8 mg/h via INTRAVENOUS
  Filled 2017-05-04 (×6): qty 80

## 2017-05-04 MED ORDER — SODIUM CHLORIDE 0.9 % IV BOLUS (SEPSIS)
1000.0000 mL | Freq: Once | INTRAVENOUS | Status: AC
  Administered 2017-05-04: 1000 mL via INTRAVENOUS

## 2017-05-04 MED ORDER — ETOMIDATE 2 MG/ML IV SOLN
INTRAVENOUS | Status: AC
  Filled 2017-05-04: qty 10

## 2017-05-04 MED ORDER — SODIUM CHLORIDE 0.9% FLUSH: 10.0000 mL | INTRAVENOUS | Status: DC | PRN

## 2017-05-04 MED ORDER — AMIODARONE HCL IN DEXTROSE 360-4.14 MG/200ML-% IV SOLN
INTRAVENOUS | Status: AC
  Administered 2017-05-04: 60 mg/h via INTRAVENOUS
  Filled 2017-05-04: qty 200

## 2017-05-04 MED ORDER — ORAL CARE MOUTH RINSE: 15.0000 mL | Freq: Two times a day (BID) | OROMUCOSAL | Status: DC

## 2017-05-04 MED ORDER — TRANDOLAPRIL 1 MG PO TABS
2.0000 mg | ORAL_TABLET | Freq: Every day | ORAL | Status: DC
  Filled 2017-05-04: qty 2

## 2017-05-04 MED ORDER — GLUCAGON HCL RDNA (DIAGNOSTIC) 1 MG IJ SOLR
1.0000 mg | Freq: Once | INTRAMUSCULAR | Status: AC | PRN
  Administered 2017-05-04: 1 mg via INTRAVENOUS
  Filled 2017-05-04: qty 1

## 2017-05-04 MED ORDER — AMIODARONE IV BOLUS ONLY 150 MG/100ML
INTRAVENOUS | Status: AC
  Filled 2017-05-04: qty 100

## 2017-05-04 MED ORDER — OCTREOTIDE ACETATE 100 MCG/ML IJ SOLN
50.0000 ug | Freq: Four times a day (QID) | INTRAMUSCULAR | Status: DC
  Administered 2017-05-04: 50 ug via SUBCUTANEOUS
  Filled 2017-05-04: qty 1
  Filled 2017-05-04 (×2): qty 0.5

## 2017-05-04 MED ORDER — ALBUTEROL SULFATE (2.5 MG/3ML) 0.083% IN NEBU: 2.5000 mg | INHALATION_SOLUTION | RESPIRATORY_TRACT | Status: DC | PRN

## 2017-05-04 MED ORDER — ETOMIDATE 2 MG/ML IV SOLN: 20.0000 mg | Freq: Once | INTRAVENOUS | Status: DC

## 2017-05-04 MED ORDER — IPRATROPIUM-ALBUTEROL 0.5-2.5 (3) MG/3ML IN SOLN
3.0000 mL | Freq: Four times a day (QID) | RESPIRATORY_TRACT | Status: DC
  Administered 2017-05-04 – 2017-05-06 (×9): 3 mL via RESPIRATORY_TRACT
  Filled 2017-05-04 (×10): qty 3

## 2017-05-04 MED ORDER — CLONAZEPAM 0.5 MG PO TABS: 0.5000 mg | ORAL_TABLET | Freq: Two times a day (BID) | ORAL | Status: DC | PRN

## 2017-05-04 MED ORDER — DEXTROSE 50 % IV SOLN
INTRAVENOUS | Status: AC
Start: 2017-05-04 — End: 2017-05-05
  Filled 2017-05-04: qty 50

## 2017-05-04 MED ORDER — ACETAMINOPHEN 650 MG RE SUPP: 650.0000 mg | Freq: Four times a day (QID) | RECTAL | Status: DC | PRN

## 2017-05-04 MED ORDER — SODIUM CHLORIDE 0.9 % IV BOLUS
500.0000 mL | Freq: Once | INTRAVENOUS | Status: AC
  Administered 2017-05-04: 500 mL via INTRAVENOUS

## 2017-05-04 MED ORDER — MAGNESIUM SULFATE 2 GM/50ML IV SOLN
2.0000 g | Freq: Once | INTRAVENOUS | Status: AC
  Administered 2017-05-04: 2 g via INTRAVENOUS
  Filled 2017-05-04: qty 50

## 2017-05-04 MED ORDER — SODIUM CHLORIDE 0.9 % IV BOLUS
1000.0000 mL | Freq: Once | INTRAVENOUS | Status: AC
  Administered 2017-05-04: 1000 mL via INTRAVENOUS

## 2017-05-04 MED ORDER — DEXTROSE 50 % IV SOLN
3.0000 | Freq: Once | INTRAVENOUS | Status: DC
  Filled 2017-05-04: qty 150

## 2017-05-04 MED ORDER — SUCRALFATE 1 G PO TABS
1.0000 g | ORAL_TABLET | Freq: Three times a day (TID) | ORAL | Status: DC
  Administered 2017-05-05 (×4): 1 g via ORAL
  Filled 2017-05-04 (×5): qty 1

## 2017-05-04 MED ORDER — IPRATROPIUM-ALBUTEROL 20-100 MCG/ACT IN AERS: 1.0000 | INHALATION_SPRAY | Freq: Four times a day (QID) | RESPIRATORY_TRACT | Status: DC

## 2017-05-04 MED ORDER — VASOPRESSIN 20 UNIT/ML IV SOLN
0.0300 [IU]/min | INTRAVENOUS | Status: DC
  Administered 2017-05-04 – 2017-05-06 (×3): 0.03 [IU]/min via INTRAVENOUS
  Filled 2017-05-04 (×5): qty 2

## 2017-05-04 NOTE — ED Notes (Signed)
Family at bedside. 

## 2017-05-04 NOTE — Procedures (Signed)
Central venous catheter placement  Verbal consent was obtained from the family at the bedside  Indications: Requirement to titrate vasopressor and lack off IV access  Procedure: Guided was ultrasound a triple-lumen catheter was successively placed in right internal jugular vein.  Placement was confirmed with chest x-ray and good venous return through all 3 ports.  Patient tolerated the procedure and no immediate complications.

## 2017-05-04 NOTE — ED Notes (Signed)
Date and time results received: 05/22/2017 0830 (use smartphrase ".now" to insert current time)  Test: lactic acid Critical Value: 7.5 Name of Provider Notified: [place]   See [facility] for new oders

## 2017-05-04 NOTE — Progress Notes (Signed)
Please note patient is currently followed by outpatient PALLIATIVE at [facility] skilled nursing. CSW Shela Leff made aware. Flo Shanks RN, BSN, [facility] [place], hospital Liaison

## 2017-05-04 NOTE — Progress Notes (Addendum)
Attempted to place right femoral arterial line, however unable to advance guidewire.  Pt requiring multiple vasopressors currently at max doses and pt remains hypotensive.  Updated family regarding poor prognosis and discussed code status, at this time they would like pt to remain FULL CODE.  Will continue to monitor and assess pt.  Marda Stalker, [place] Pager [phone] (please enter 7 digits) PCCM Consult Pager [phone] (please enter 7 digits)

## 2017-05-04 NOTE — ED Triage Notes (Signed)
Pt arrived to the ED via EMS for complaints of abdominal pain, cough and bloody sputum. Pt is AOx4 in no apparent distress, Dr. Karma Greaser is at bedside during triage.

## 2017-05-04 NOTE — H&P (Signed)
[facility] NAME: Kathryn Hayes    MR#:  [id number]  DATE OF BIRTH:  01-27-1934  DATE OF ADMISSION:  05/12/2017  PRIMARY CARE PHYSICIAN: Juluis Pitch, MD   REQUESTING/REFERRING PHYSICIAN: Dr. Burlene Arnt  CHIEF COMPLAINT:   Chief Complaint  Patient presents with  . Hemoptysis  . Abdominal Pain    HISTORY OF PRESENT ILLNESS:  Kathryn Hayes  is a 83 y.o. female with a known history of diabetes, hypertension, chronic diastolic CHF, COPD, chronic respiratory failure, obesity, bedbound status presents from nursing home due to streaks of blood in her sputum.  Has chronic shortness of breath which is the same.  Also has chronic epigastric pain which has worsened over the last 1 month. Patient has gastroparesis and was recently started on Reglan.  Patient had significant leukocytosis and lactic acidosis.  CT scan of the abdomen shows gastric mass versus bezoar. Patient is being admitted for sepsis of unknown etiology in the abnormal CT scan of the abdomen.  PAST MEDICAL HISTORY:   Past Medical History:  Diagnosis Date  . Anxiety    unspecified  . Arthritis   . Breast cancer ([place]) 2007   Early stage left breast cancer status post partial colectomy, 04/2005 (Dr. Oliva Bustard)  . Chest pain, unspecified   . CHF (congestive heart failure) ([place])   . Chronic headache   . Colon cancer ([place])    status post partial colectomy, diagnosed 2000  . Colon neoplasm   . COPD (chronic obstructive pulmonary disease) ([place])   . Depression    unspecified  . Diabetes mellitus   . Fibrocystic breast   . History of breast cancer   . History of colon cancer   . HTN (hypertension) with goal to be determined   . Hyperlipidemia   . Hyperlipidemia, unspecified   . Hypertension   . Obesity, unspecified   . Sleep apnea    mild to moderate    PAST SURGICAL HISTORY:   Past Surgical History:  Procedure Laterality Date  . APPENDECTOMY    . BREAST LUMPECTOMY   2007   left  . BUNIONECTOMY Right   . CATARACT EXTRACTION W/ INTRAOCULAR LENS IMPLANT & ANTERIOR VITRECTOMY, BILATERAL Bilateral   . COLON SURGERY  1993  . COLOSTOMY    . DILATION AND CURETTAGE, DIAGNOSTIC / THERAPEUTIC     Fibroid removal  . INSERT REPLACE REMOVE PACEMAKER     ppm medtronic [id number] Adapta dula chamber rate responsive. 02/21/14  . MASTECTOMY PARTIAL / LUMPECTOMY    . PARTIAL COLECTOMY     Abdominal & Transanal   . TONSILLECTOMY    . TOTAL KNEE ARTHROPLASTY Right 07/03/2013   Right total knee arthroplasty using computer assisted navigation  . TUBAL LIGATION Bilateral 1971   with questionable appendectomy    SOCIAL HISTORY:   Social History   Tobacco Use  . Smoking status: Former Smoker    Packs/day: 1.50    Years: 50.00    Pack years: 75.00    Types: Cigarettes    Last attempt to quit: 10/05/2010    Years since quitting: 6.5  . Smokeless tobacco: Never Used  Substance Use Topics  . Alcohol use: No    Alcohol/week: 1.0 oz    Types: 2 Standard drinks or equivalent per week    Comment: Occasionally    FAMILY HISTORY:   Family History  Problem Relation Age of Onset  . Factor V Leiden deficiency Sister   . Colon  polyps Sister   . Breast cancer Mother   . Colon cancer Mother   . Alzheimer's disease Mother   . Prostate cancer Father     DRUG ALLERGIES:  No Known Allergies  REVIEW OF SYSTEMS:   Review of Systems  Constitutional: Positive for malaise/fatigue. Negative for chills and fever.  HENT: Negative for sore throat.   Eyes: Negative for blurred vision, double vision and pain.  Respiratory: Positive for cough, hemoptysis and sputum production. Negative for shortness of breath and wheezing.   Cardiovascular: Negative for chest pain, palpitations, orthopnea and leg swelling.  Gastrointestinal: Positive for abdominal pain, constipation, nausea and vomiting. Negative for diarrhea and heartburn.  Genitourinary: Negative for dysuria and hematuria.   Musculoskeletal: Negative for back pain and joint pain.  Skin: Negative for rash.  Neurological: Negative for sensory change, speech change, focal weakness and headaches.  Endo/Heme/Allergies: Does not bruise/bleed easily.  Psychiatric/Behavioral: Positive for memory loss. Negative for depression. The patient is not nervous/anxious.     MEDICATIONS AT HOME:   Prior to Admission medications   Medication Sig Start Date End Date Taking? Authorizing Provider  albuterol (PROVENTIL) (2.5 MG/3ML) 0.083% nebulizer solution Take 2.5 mg by nebulization every 6 (six) hours as needed for wheezing or shortness of breath.    Yes [provider]  aspirin EC 81 MG tablet Take 81 mg by mouth daily.    Yes [provider]  azithromycin (ZITHROMAX) 250 MG tablet Take 1 tablet (250 mg total) by mouth daily. 05/02/17  Yes Harvest Dark, MD  budesonide (PULMICORT) 0.5 MG/2ML nebulizer solution Take 0.5 mg by nebulization 2 (two) times daily.    Yes [provider]  Camphor-Eucalyptus-Menthol (VICKS VAPORUB) 4.7-1.2-2.6 % OINT Apply liberal amount topically to fingernails at bedtime for Onychomycosis   Yes [provider]  Cholecalciferol 2000 units CAPS Take 2,000 Units by mouth daily.   Yes [provider]  clonazePAM (KLONOPIN) 0.5 MG tablet Give 1/2 tablet (0.25 mg) in the AM by mouth  and 1 whole tablet (0.5 mg) in the PM by mouth (Insurance doesn't cover Lorazepam) 03/30/17  Yes Toni Arthurs, NP  divalproex (DEPAKOTE SPRINKLE) 125 MG capsule Take 250 mg by mouth 2 (two) times daily.    Yes [provider]  DULoxetine (CYMBALTA) 60 MG capsule Take 60 mg by mouth daily.    Yes [provider]  feeding supplement, ENSURE ENLIVE, (ENSURE ENLIVE) LIQD Take 237 mLs by mouth 2 (two) times daily between meals. 03/04/17  Yes Gladstone Lighter, MD  fluticasone-salmeterol (ADVAIR HFA) 469-62 MCG/ACT inhaler Inhale 2 puffs into the lungs 2 (two) times  daily. 8 am and 8 pm    Yes [provider]  furosemide (LASIX) 20 MG tablet Take 20 mg by mouth daily.    Yes [provider]  gabapentin (NEURONTIN) 400 MG capsule Take 400 mg by mouth 3 (three) times daily.   Yes [provider]  insulin aspart (NOVOLOG) 100 UNIT/ML injection 3 (three) times daily with meals. Per Sliding Scale; If Blood Sugar is 176 to 250, give 3 Units.If Blood Sugar is 251 to 350, give 6 Units.If Blood Sugar is 351 to 450, give 10 Units.  If BS >450, give 12 units and recheck in 1 hour. If still >450, call MD/NP.   Yes [provider]  Ipratropium-Albuterol (COMBIVENT RESPIMAT) 20-100 MCG/ACT AERS respimat Inhale 1 puff into the lungs 4 (four) times daily. RINSE MOUTH AFTER USE....Marland KitchenFOR PNEUMONIA AND BRONCHITIS   Yes [provider]  ketotifen (ZADITOR) 0.025 % ophthalmic solution Place 1 drop into both eyes 2 (two) times daily.    Yes [provider]  magnesium oxide (MAG-OX) 400 MG tablet Take 400 mg by mouth 2 (two) times daily.    Yes [provider]  Melatonin 10 MG TABS Take 10 mg by mouth at bedtime.    Yes [provider]  Menthol 1 MG LOZG Use as directed 1 lozenge in the mouth or throat every 6 (six) hours as needed. For irritation/pain   Yes [provider]  metoCLOPramide (REGLAN) 5 MG tablet Take 5 mg by mouth 4 (four) times daily -  before meals and at bedtime.   Yes [provider]  metoprolol tartrate (LOPRESSOR) 25 MG tablet Take 12.5 mg by mouth 2 (two) times daily.   Yes [provider]  Multiple Vitamins-Minerals (SENIOR TABS) TABS Take 1 tablet by mouth daily.   Yes [provider]  nystatin (MYCOSTATIN/NYSTOP) powder Apply 1 application topically to abdominal folds daily for redness and irritation.   Yes [provider]  pantoprazole (PROTONIX) 20 MG tablet Take 20 mg by mouth daily.   Yes [provider]  potassium chloride  (K-DUR,KLOR-CON) 10 MEQ tablet Take 10 mEq by mouth every other day.    Yes [provider]  pravastatin (PRAVACHOL) 10 MG tablet Take 10 mg by mouth daily at 6 PM. On Sunday   Yes [provider]  senna-docusate (SENOKOT-S) 8.6-50 MG tablet Take 1 tablet by mouth 2 (two) times daily.    Yes [provider]  sodium chloride 1 g tablet Take 1 tablet (1 g total) by mouth 2 (two) times daily with a meal. 03/04/17  Yes Gladstone Lighter, MD  sucralfate (CARAFATE) 1 g tablet Take 1 tablet by mouth 4 (four) times daily. 04/27/17  Yes [provider]  trandolapril (MAVIK) 2 MG tablet Take 2 mg by mouth daily.   Yes [provider]  acetaminophen (TYLENOL) 325 MG tablet Take 650 mg by mouth every 4 (four) hours as needed for mild pain, moderate pain or fever. Do not exceed 3000 mg in 24hrs     [provider]  Amino Acids-Protein Hydrolys (FEEDING SUPPLEMENT, PRO-STAT SUGAR FREE 64,) LIQD Take 30 mLs by mouth 2 (two) times daily between meals.    [provider]  bisacodyl (DULCOLAX) 5 MG EC tablet Take 2 tablets (10 mg total) by mouth daily as needed for moderate constipation. 02/18/17   Demetrios Loll, MD  guaiFENesin (MUCINEX) 600 MG 12 hr tablet Take 1 tablet (600 mg total) by mouth 2 (two) times daily. Patient not taking: Reported on 05/30/2017 03/04/17   Gladstone Lighter, MD  guaiFENesin-dextromethorphan [facility] DM) 100-10 MG/5ML syrup Take 5 mLs by mouth every 4 (four) hours as needed for cough. 02/18/17   Demetrios Loll, MD  ibuprofen (ADVIL,MOTRIN) 600 MG tablet Take 600 mg by mouth every 6 (six) hours as needed for headache.    [provider]  Skin Protectants, Misc. (EUCERIN) cream Apply liberal amount topically to dry areas of skin daily after bath and as needed. Ok to keep at bedside    [provider]     VITAL SIGNS:  Blood pressure 105/79, pulse 96, temperature 97.9 F (36.6 C), temperature source Oral, resp. rate (!)  28, height 4\' 11"  (1.499 m), weight 104.8 kg (231 lb), SpO2 94 %.  PHYSICAL EXAMINATION:  Physical Exam  GENERAL:  83 y.o.-year-old patient lying in the bed .  Obese EYES: Pupils equal, round, reactive to light and accommodation. No scleral icterus. Extraocular muscles intact.  HEENT: Head atraumatic, normocephalic. Oropharynx and nasopharynx clear. No oropharyngeal erythema, moist oral mucosa  NECK:  Supple, no jugular venous distention. No thyroid enlargement, no tenderness.  LUNGS: Normal breath sounds bilaterally, no wheezing, rales, rhonchi. No use of accessory muscles of respiration. Decreased air entry  CARDIOVASCULAR: S1, S2 normal. No murmurs, rubs, or gallops.  ABDOMEN: Soft, epigastric tenderness, nondistended. Bowel sounds present. No organomegaly or mass.  EXTREMITIES: No pedal edema, cyanosis, or clubbing. + 2 pedal & radial pulses b/l.   NEUROLOGIC: Cranial nerves II through XII are intact.  PSYCHIATRIC: The patient is alert and awake. SKIN: No obvious rash, lesion, or ulcer.   LABORATORY PANEL:   CBC Recent Labs  Lab 05/06/2017 0641  WBC 18.4*  HGB 10.6*  HCT 31.8*  PLT 353   ------------------------------------------------------------------------------------------------------------------  Chemistries  Recent Labs  Lab 05/29/2017 0641  NA 135  K 5.1  CL 95*  CO2 28  GLUCOSE 136*  BUN 16  CREATININE 0.62  CALCIUM 7.1*  AST 71*  ALT 17  ALKPHOS 78  BILITOT 0.6   ------------------------------------------------------------------------------------------------------------------  Cardiac Enzymes Recent Labs  Lab 05/31/2017 0641  TROPONINI <0.03   ------------------------------------------------------------------------------------------------------------------  RADIOLOGY:  Dg Chest 2 View  Result Date: 05/02/2017 CLINICAL DATA:  Pt arrived via ems - she denies any pain or shortness of breath - the family went to visit her at the nursing home and they  stated that she was breathing to slow and they wanted her to be evaluated EXAM: CHEST - 2 VIEW COMPARISON:  02/27/2017 FINDINGS: Progressive small pleural effusions with adjacent consolidation/atelectasis in the lower lobes left greater than right. Stable cardiomegaly. Aortic Atherosclerosis (ICD10-170.0) Stable left subclavian transvenous pacemaker. Surgical clips in the left axilla. No pneumothorax. Visualized bones unremarkable. IMPRESSION: 1. Increase in bibasilar atelectasis/consolidation with small effusions, left greater than right. Electronically Signed   By: Lucrezia Europe M.D.   On: 05/02/2017 19:02   Ct Chest W Contrast  Result Date: 05/05/2017 CLINICAL DATA:  Hemoptysis versus hematemesis. Abdominal pain, cough. EXAM: CT CHEST, ABDOMEN, AND PELVIS WITH CONTRAST TECHNIQUE: Multidetector CT imaging of the chest, abdomen and pelvis was performed following the standard protocol during bolus administration of intravenous contrast. CONTRAST:  65mL ISOVUE-370 IOPAMIDOL (ISOVUE-370) INJECTION 76% COMPARISON:  Chest CT 03/02/2017.  Abdominal CT 05/17/2007. FINDINGS: CT CHEST FINDINGS Cardiovascular: Moderate coronary artery calcifications in the aortic arch and descending thoracic aorta. Moderate scattered coronary artery calcifications. Pacer wires noted in the right atrium and right ventricle. Heart is normal size. Aorta is normal caliber. No evidence of aortic dissection. Mediastinum/Nodes: No mediastinal, hilar, or axillary adenopathy. The esophagus is fluid-filled. Lungs/Pleura: Small bilateral pleural effusions. Compressive atelectasis in the lower lobes. Musculoskeletal: No acute bony abnormality. CT ABDOMEN PELVIS FINDINGS Hepatobiliary: Fatty infiltration of the liver. No focal abnormality or biliary ductal dilatation. Gallbladder unremarkable. Pancreas: No focal abnormality or ductal dilatation. Mildly atrophic. Spleen: No focal abnormality.  Normal size. Adrenals/Urinary Tract: Areas of cortical  thinning bilaterally. No hydronephrosis. Cysts noted in the mid to lower pole of the left kidney. No adrenal mass or visible urinary bladder abnormality. Stomach/Bowel: Large stool burden throughout the colon. Scattered sigmoid diverticula. Large masslike filling defect noted in the stomach which appears to be separate from the gastric wall. This contains gas within this large intraluminal mass. Fluid and gas surrounding mass. I favor this represents a bezoar. No visible gastric wall abnormality or  gastric outlet obstruction. Small bowel is decompressed. Vascular/Lymphatic: Heavily calcified aorta and branch vessels. No aneurysm or adenopathy. Reproductive: Probable anterior uterine fibroid, best seen on sagittal imaging, measuring up to 3.2 cm. No adnexal mass. Other: No free fluid or free air. Musculoskeletal: No acute bony abnormality. IMPRESSION: Small bilateral pleural effusions with compressive atelectasis in the lower lobes. Coronary artery disease, aortic atherosclerosis. Large intraluminal filling defect within the stomach, question bezoar. No visible gastric wall abnormality. This could be further evaluated with direct visualization/endoscopy. Esophagus is fluid-filled possibly related to reflux or dysmotility. Large stool burden throughout the colon. Fatty infiltration of the liver. Aortoiliac atherosclerosis. Electronically Signed   By: Rolm Baptise M.D.   On: 06/03/2017 09:52   Ct Abdomen Pelvis W Contrast  Result Date: 05/14/2017 CLINICAL DATA:  Hemoptysis versus hematemesis. Abdominal pain, cough. EXAM: CT CHEST, ABDOMEN, AND PELVIS WITH CONTRAST TECHNIQUE: Multidetector CT imaging of the chest, abdomen and pelvis was performed following the standard protocol during bolus administration of intravenous contrast. CONTRAST:  45mL ISOVUE-370 IOPAMIDOL (ISOVUE-370) INJECTION 76% COMPARISON:  Chest CT 03/02/2017.  Abdominal CT 05/17/2007. FINDINGS: CT CHEST FINDINGS Cardiovascular: Moderate coronary  artery calcifications in the aortic arch and descending thoracic aorta. Moderate scattered coronary artery calcifications. Pacer wires noted in the right atrium and right ventricle. Heart is normal size. Aorta is normal caliber. No evidence of aortic dissection. Mediastinum/Nodes: No mediastinal, hilar, or axillary adenopathy. The esophagus is fluid-filled. Lungs/Pleura: Small bilateral pleural effusions. Compressive atelectasis in the lower lobes. Musculoskeletal: No acute bony abnormality. CT ABDOMEN PELVIS FINDINGS Hepatobiliary: Fatty infiltration of the liver. No focal abnormality or biliary ductal dilatation. Gallbladder unremarkable. Pancreas: No focal abnormality or ductal dilatation. Mildly atrophic. Spleen: No focal abnormality.  Normal size. Adrenals/Urinary Tract: Areas of cortical thinning bilaterally. No hydronephrosis. Cysts noted in the mid to lower pole of the left kidney. No adrenal mass or visible urinary bladder abnormality. Stomach/Bowel: Large stool burden throughout the colon. Scattered sigmoid diverticula. Large masslike filling defect noted in the stomach which appears to be separate from the gastric wall. This contains gas within this large intraluminal mass. Fluid and gas surrounding mass. I favor this represents a bezoar. No visible gastric wall abnormality or gastric outlet obstruction. Small bowel is decompressed. Vascular/Lymphatic: Heavily calcified aorta and branch vessels. No aneurysm or adenopathy. Reproductive: Probable anterior uterine fibroid, best seen on sagittal imaging, measuring up to 3.2 cm. No adnexal mass. Other: No free fluid or free air. Musculoskeletal: No acute bony abnormality. IMPRESSION: Small bilateral pleural effusions with compressive atelectasis in the lower lobes. Coronary artery disease, aortic atherosclerosis. Large intraluminal filling defect within the stomach, question bezoar. No visible gastric wall abnormality. This could be further evaluated with  direct visualization/endoscopy. Esophagus is fluid-filled possibly related to reflux or dysmotility. Large stool burden throughout the colon. Fatty infiltration of the liver. Aortoiliac atherosclerosis. Electronically Signed   By: Rolm Baptise M.D.   On: 05/17/2017 09:52   Dg Chest Portable 1 View  Result Date: 05/16/2017 CLINICAL DATA:  Abdominal pain, cough, and bloody sputum. EXAM: PORTABLE CHEST 1 VIEW COMPARISON:  05/02/2017 FINDINGS: Cardiac pacemaker. Shallow inspiration with atelectasis in the lung bases. Cardiac enlargement. No vascular congestion, edema, or consolidation. Hazy appearance of the left costophrenic angle may represent small effusion. Calcified and tortuous aorta. No pneumothorax. IMPRESSION: Shallow inspiration with atelectasis in the lung bases. Possible left pleural effusion. Cardiac enlargement. No edema or consolidation. Aortic atherosclerosis. Electronically Signed   By: Lucienne Capers M.D.   On:  05/30/2017 06:45   IMPRESSION AND PLAN:   * Severe Sepsis POA. Etiology unclear. Leucocytosis, tachycardia, lactic acidosis Start broad spectrum abx. Vanc and zosyn Cx sent and pending IVF resuscitation. Repeat lactic acid  * gastric mass vs bezoar Consult GI for EGD Would be increased risk for any procedure with her multiple co morbidities N.p.o. except medications  * Mild hemoptysis Likely from COPD. Nothing acute on CT chest. Patient sees pulmonary as OP. Will request their input.  *Chronic diastolic CHF.  Monitor for any fluid overload.  *Diabetes mellitus.  Every 4 Accu-Cheks with sliding scale insulin.  DVT prophylaxis with SCDs  All the records are reviewed and case discussed with ED provider. Management plans discussed with the patient, family and they are in agreement.  CODE STATUS: DNR  TOTAL TIME TAKING CARE OF THIS PATIENT: 40 minutes.   Leia Alf Jaysean Manville M.D on 05/26/2017 at 11:31 AM  Between 7am to 6pm - Pager - [phone]  After 6pm go to  [email or web address] - password [email or web address] [facility]  Office  [phone]  CC: Primary care physician; Juluis Pitch, MD  Note: This dictation was prepared with Dragon dictation along with smaller phrase technology. Any transcriptional errors that result from this process are unintentional.

## 2017-05-04 NOTE — ED Notes (Signed)
Attempted to call report at this time.  RN unavailable and this RN was asked to call back in 5 minutes.

## 2017-05-04 NOTE — ED Notes (Signed)
Patient transported to CT with this RN 

## 2017-05-04 NOTE — ED Notes (Addendum)
Per lab unable to obtain second set of blood cultures after multiple attempts and resources made , MD aware

## 2017-05-04 NOTE — ED Notes (Signed)
Lab at bedside to draw second lactate at this time

## 2017-05-04 NOTE — ED Provider Notes (Addendum)
----------------------------------------- 8:10 AM on 05/18/2017 -----------------------------------------  Signed out to me, patient not on blood thinners with some mild hemoptysis, pressures complaining of abdominal pain does not have a surgical abdomen.  Has been ordered IV fluids which are pending, patient had no IV access initially we had to get ultrasound-guided IV access.  That has been obtained blood work is now been obtained, pressures are difficult to easily assess, patient is morbidly obese limits I think accuracy of our blood pressure readings to some extent.  We are giving IV fluids, there is no evidence of exsanguination or significant bleeding at this moment, source of the bleeding is unclear, could be GI could be pulmonary, last echo showed EF of 55 to 60% so I feel we have some room for fluid.  At this time we have no blood work back, chest x-ray shows no consolidation which seems to be different from recent prior visit.  CT scans have been ordered just think will help tease all this out.  We are continuing to monitor the patient.  ----------------------------------------- 8:39 AM on 05/06/2017 -----------------------------------------  Lactic is noted, we are giving her IV fluid, family does not believe she would want to be intubated, nor does she, this is a family discussion they are having at this time.  This limits my ability to be too aggressive with fluids, she is satting 98% on her home 3 L in no acute distress at this time blood pressure is 110, heart rate is between 90 and 120, we are starting aggressive broad-spectrum antibiotics with Vanco and Zosyn.  Serial abdominal exams show a benign abdomen with epigastric abdominal discomfort which the family states is chronic.  There is no compelling evidence of significant pulmonary pathology on what we have accumulated so far.  White count is elevated, we will obtain CT scan if she can lie flat to see if we can better tease out what is  going on.  Family made aware of patient's critical status Schuyler Amor, MD    05/15/2017 [phone]  ----------------------------------------- 9:45 AM on 05/30/2017 -----------------------------------------  Discussion with family about goals of care, they do want the patient to be DNR/DNI they want her to receive IV fluids and antibiotics but they do not want central lines and they do not want procedures and they do not want intubation and they do not want ICU stay and they do not want CPR.  Exact etiology of the patient's complaint is still not clear, we are taking a guaiac exam on her, CT scan shows some abnormality in the stomach unclear if it is a mass, or a bezoar, it is certainly atypical and radiologist feels that whenever this is likely the source of her symptoms but there is no evidence in the lungs of any pneumonia or pulmonary edema or mass.  We will continue IV hydration therefore.  She is not a candidate for surgery.  There is no evidence of ischemic gut etc.  CRITICAL CARE Performed by: Schuyler Amor   Total critical care time: 45 minutes  Critical care time was exclusive of separately billable procedures and treating other patients.  Critical care was necessary to treat or prevent imminent or life-threatening deterioration.  Critical care was time spent personally by me on the following activities: development of treatment plan with patient and/or surrogate as well as nursing, discussions with consultants, evaluation of patient's response to treatment, examination of patient, obtaining history from patient or surrogate, ordering and performing treatments and interventions, ordering and review  of laboratory studies, ordering and review of radiographic studies, pulse oximetry and re-evaluation of patient's condition.   ----------------------------------------- 10:11 AM on 05/17/2017 -----------------------------------------  CT scan shows something abnormal in her stomach,  there is guaiac-negative stool, brown, unclear exactly the etiology of this unclear mass in her abdomen.  However, there is no evidence of a pulmonary pathology resulting in hemoptysis.  We are giving her IV fluids, her BNP has been as high as 800 a couple months ago to 200 now which gives [place] I think a little room to play with however, patient is a DNR/DNI at this time and therefore we must be careful about fluid overload.  Discussed with the hospitalist, they agree with management will admit.    Schuyler Amor, MD 05/29/2017 0840    Schuyler Amor, MD 05/20/2017 8921    Schuyler Amor, MD 05/27/2017 1941    Schuyler Amor, MD 06/02/2017 1016

## 2017-05-04 NOTE — Progress Notes (Signed)
   05/19/2017 1500  Clinical Encounter Type  Visited With Family  Visit Type Initial;Critical Care;Spiritual support  Referral From Nurse  Spiritual Encounters  Spiritual Needs Prayer;Emotional  CH paged to come and offer family support after medical update from MD; CH met family and offered emotional and spiritual support along with prayer at bedside.  CH available as needed for additional support. 

## 2017-05-04 NOTE — ED Notes (Signed)
IV team placed peripheral IV at this time

## 2017-05-04 NOTE — Consult Note (Signed)
Name: Kathryn Hayes MRN: [id number] DOB: April 24, 1934     CONSULTATION DATE: 05/08/2017    HISTORY OF PRESENT ILLNESS:   32 y old lady with history of DM, HTN, HFpEF, gastroparesis, COPD, chronic respiratory failure, bedbound and NHR. Patient presented to ED with hematemesis / hemoptysis. She is admitted with sepsis and questionable gastric mass / bezoar. While on the floor she was noticed to have hypotension initially responded to volume and transfer to step down was requested because of lactic acid trending up to 10. All history was obtained from PCP, family, nursing and EMR.   Upon arrival to ICU patient was lethargic while awake, then went unresponsive with POC Glu 54, responded to D50% then BG dropped again with unresponsiveness and poor breathing effort. Code status was verified with the family at the bedside and thye agreed to full code for now however patient's wishes not to be maintained on life support if no improvement. Patient was emergently intubated and had TLC placed in Rt. IJ. Please see Procedure notes.   PAST MEDICAL HISTORY :   has a past medical history of Anxiety, Arthritis, Breast cancer ([place]) (2007), Chest pain, unspecified, CHF (congestive heart failure) ([place]), Chronic headache, Colon cancer ([place]), Colon neoplasm, COPD (chronic obstructive pulmonary disease) ([place]), Depression, Diabetes mellitus, Fibrocystic breast, History of breast cancer, History of colon cancer, HTN (hypertension) with goal to be determined, Hyperlipidemia, Hyperlipidemia, unspecified, Hypertension, Obesity, unspecified, and Sleep apnea.  has a past surgical history that includes Breast lumpectomy (2007); Colon surgery (1993); Tubal ligation (Bilateral, 1971); Appendectomy; Colostomy; Partial colectomy; Tonsillectomy; Bunionectomy (Right); Dilation and curettage, diagnostic / therapeutic; Cataract extraction w/ intraocular lens implant & anterior vitrectomy, bilateral (Bilateral); Total knee arthroplasty  (Right, 07/03/2013); Mastectomy partial / lumpectomy; and INSERT REPLACE REMOVE PACEMAKER. Prior to Admission medications   Medication Sig Start Date End Date Taking? Authorizing Provider  albuterol (PROVENTIL) (2.5 MG/3ML) 0.083% nebulizer solution Take 2.5 mg by nebulization every 6 (six) hours as needed for wheezing or shortness of breath.    Yes [provider]  aspirin EC 81 MG tablet Take 81 mg by mouth daily.    Yes [provider]  azithromycin (ZITHROMAX) 250 MG tablet Take 1 tablet (250 mg total) by mouth daily. 05/02/17  Yes Harvest Dark, MD  budesonide (PULMICORT) 0.5 MG/2ML nebulizer solution Take 0.5 mg by nebulization 2 (two) times daily.    Yes [provider]  Camphor-Eucalyptus-Menthol (VICKS VAPORUB) 4.7-1.2-2.6 % OINT Apply liberal amount topically to fingernails at bedtime for Onychomycosis   Yes [provider]  Cholecalciferol 2000 units CAPS Take 2,000 Units by mouth daily.   Yes [provider]  clonazePAM (KLONOPIN) 0.5 MG tablet Give 1/2 tablet (0.25 mg) in the AM by mouth  and 1 whole tablet (0.5 mg) in the PM by mouth (Insurance doesn't cover Lorazepam) 03/30/17  Yes Toni Arthurs, NP  divalproex (DEPAKOTE SPRINKLE) 125 MG capsule Take 250 mg by mouth 2 (two) times daily.    Yes [provider]  DULoxetine (CYMBALTA) 60 MG capsule Take 60 mg by mouth daily.    Yes [provider]  feeding supplement, ENSURE ENLIVE, (ENSURE ENLIVE) LIQD Take 237 mLs by mouth 2 (two) times daily between meals. 03/04/17  Yes Gladstone Lighter, MD  fluticasone-salmeterol (ADVAIR HFA) 240-97 MCG/ACT inhaler Inhale 2 puffs into the lungs 2 (two) times daily. 8 am and 8 pm    Yes [provider]  furosemide (LASIX) 20 MG tablet Take 20 mg by mouth  daily.    Yes [provider]  gabapentin (NEURONTIN) 400 MG capsule Take 400 mg by mouth 3 (three) times daily.   Yes [provider]  insulin aspart  (NOVOLOG) 100 UNIT/ML injection 3 (three) times daily with meals. Per Sliding Scale; If Blood Sugar is 176 to 250, give 3 Units.If Blood Sugar is 251 to 350, give 6 Units.If Blood Sugar is 351 to 450, give 10 Units.  If BS >450, give 12 units and recheck in 1 hour. If still >450, call MD/NP.   Yes [provider]  Ipratropium-Albuterol (COMBIVENT RESPIMAT) 20-100 MCG/ACT AERS respimat Inhale 1 puff into the lungs 4 (four) times daily. RINSE MOUTH AFTER USE....Marland KitchenFOR PNEUMONIA AND BRONCHITIS   Yes [provider]  ketotifen (ZADITOR) 0.025 % ophthalmic solution Place 1 drop into both eyes 2 (two) times daily.    Yes [provider]  magnesium oxide (MAG-OX) 400 MG tablet Take 400 mg by mouth 2 (two) times daily.    Yes [provider]  Melatonin 10 MG TABS Take 10 mg by mouth at bedtime.    Yes [provider]  Menthol 1 MG LOZG Use as directed 1 lozenge in the mouth or throat every 6 (six) hours as needed. For irritation/pain   Yes [provider]  metoCLOPramide (REGLAN) 5 MG tablet Take 5 mg by mouth 4 (four) times daily -  before meals and at bedtime.   Yes [provider]  metoprolol tartrate (LOPRESSOR) 25 MG tablet Take 12.5 mg by mouth 2 (two) times daily.   Yes [provider]  Multiple Vitamins-Minerals (SENIOR TABS) TABS Take 1 tablet by mouth daily.   Yes [provider]  nystatin (MYCOSTATIN/NYSTOP) powder Apply 1 application topically to abdominal folds daily for redness and irritation.   Yes [provider]  pantoprazole (PROTONIX) 20 MG tablet Take 20 mg by mouth daily.   Yes [provider]  potassium chloride (K-DUR,KLOR-CON) 10 MEQ tablet Take 10 mEq by mouth every other day.    Yes [provider]  pravastatin (PRAVACHOL) 10 MG tablet Take 10 mg by mouth daily at 6 PM. On Sunday   Yes [provider]  senna-docusate (SENOKOT-S) 8.6-50 MG tablet Take 1 tablet by mouth 2  (two) times daily.    Yes [provider]  sodium chloride 1 g tablet Take 1 tablet (1 g total) by mouth 2 (two) times daily with a meal. 03/04/17  Yes Gladstone Lighter, MD  sucralfate (CARAFATE) 1 g tablet Take 1 tablet by mouth 4 (four) times daily. 04/27/17  Yes [provider]  trandolapril (MAVIK) 2 MG tablet Take 2 mg by mouth daily.   Yes [provider]  acetaminophen (TYLENOL) 325 MG tablet Take 650 mg by mouth every 4 (four) hours as needed for mild pain, moderate pain or fever. Do not exceed 3000 mg in 24hrs     [provider]  Amino Acids-Protein Hydrolys (FEEDING SUPPLEMENT, PRO-STAT SUGAR FREE 64,) LIQD Take 30 mLs by mouth 2 (two) times daily between meals.    [provider]  bisacodyl (DULCOLAX) 5 MG EC tablet Take 2 tablets (10 mg total) by mouth daily as needed for moderate constipation. 02/18/17   Demetrios Loll, MD  guaiFENesin (MUCINEX) 600 MG 12 hr tablet Take 1 tablet (600 mg total) by mouth 2 (two) times daily. Patient not taking: Reported on 05/18/2017 03/04/17   Gladstone Lighter, MD  guaiFENesin-dextromethorphan [facility] DM) 100-10 MG/5ML syrup Take 5 mLs  by mouth every 4 (four) hours as needed for cough. 02/18/17   Demetrios Loll, MD  ibuprofen (ADVIL,MOTRIN) 600 MG tablet Take 600 mg by mouth every 6 (six) hours as needed for headache.    [provider]  Skin Protectants, Misc. (EUCERIN) cream Apply liberal amount topically to dry areas of skin daily after bath and as needed. Ok to keep at bedside    [provider]   No Known Allergies  FAMILY HISTORY:  family history includes Alzheimer's disease in her mother; Breast cancer in her mother; Colon cancer in her mother; Colon polyps in her sister; Factor V Leiden deficiency in her sister; Prostate cancer in her father. SOCIAL HISTORY:  reports that she quit smoking about 6 years ago. Her smoking use included cigarettes. She has a 75.00 pack-year smoking history. She  has never used smokeless tobacco. She reports that she does not drink alcohol or use drugs.  REVIEW OF SYSTEMS:   Unable to obtain due to critical illness   VITAL SIGNS: Temp:  [97.5 F (36.4 C)-97.9 F (36.6 C)] 97.5 F (36.4 C) (05/01 1359) Pulse Rate:  [62-120] 62 (05/01 1359) Resp:  [22-35] 35 (05/01 1359) BP: (89-141)/(57-117) 141/117 (05/01 1359) SpO2:  [94 %-97 %] 96 % (05/01 1247) Weight:  [96.1 kg (211 lb 13.8 oz)-104.8 kg (231 lb)] 96.1 kg (211 lb 13.8 oz) (05/01 1359)  Physical Examination:  Unresponsive [place], no distress, BEAE and no rales. Poor work of breathing S1 & S2 audible with no murmur Benign abdomen with feeble peristalses Ext: wasted and no edema   ASSESSMENT / PLAN:  Acute respiratory failure. Intubated with unresponsiveness and poor breathing effort -Monitor ABG, optimize vent settings and continue with vent support  Altered mental status with hypoglycemia and metabolic encephalopathy -Improved with D 50%, was able to follow  Commands -Monitor neuro status and consider CT head when stable  Septic shock / hemorrhagic shock -Optimize volume resuscitation, pressers, blood transfusion -Monitor Hemodynamics  Metabolic acidosis with Lactic acidemia with sepsis / mesenteric ischemia and tissue hypoxia -Hydration, empiric Vanc + Zosyn, Bicarb gtt. -Monitor ABG, renal panel, lactic acid  GI bleeding -Case was discussed with Dr. Alice Reichert for possible emergency EGD -PPI -Monitor H & H and keep HB > 9 gm/dl.  UTI -Empiric Vanc + Zosyn -Follow with culture  H/o HFpEF, grade I LV diastolic dysfunction ECHO in 02/2017 LV EF 55-60% and Grade I LV diastolic dysfunction  Full code  DVT & GI prophylaxis. Continue with supportive care  Critical care time 50 min

## 2017-05-04 NOTE — Progress Notes (Signed)
Pharmacy Antibiotic Note  Kathryn Hayes is a 83 y.o. female admitted on 05/06/2017 with sepsis.  Pharmacy has been consulted for vancomycin and Zosyn dosing.  Plan: Vancomycin 1000mg  IV given in ED with calculated stacked dose 6 hours. Will give vancomycin 1250mg  every 24 hours.  Goal trough 15-20 mcg/mL. Will order Vt prior to scheduled dose on 5/3. Zosyn ordered as 3.375g IV EI q8h.  Ke: 0.043 Vd: 47L T1/2: 16h Cmax/Cmin: 50/18   Height: 4\' 11"  (149.9 cm) Weight: 231 lb (104.8 kg) IBW/kg (Calculated) : 43.2  Temp (24hrs), Avg:97.9 F (36.6 C), Min:97.9 F (36.6 C), Max:97.9 F (36.6 C)  Recent Labs  Lab 05/02/17 1354 05/02/17 1832 05/27/2017 0641 05/30/2017 0737 05/20/2017 1104  WBC 14.4* 12.6* 18.4*  --   --   CREATININE 0.50 0.55 0.62  --   --   LATICACIDVEN  --   --   --  7.5* 7.8*    Estimated Creatinine Clearance: 59 mL/min (by C-G formula based on SCr of 0.62 mg/dL).    No Known Allergies  Antimicrobials this admission: Vancomycin 5/1 >>  Zosyn 5/1 >>   Microbiology results: 5/1 BCx(x2): pending  Thank you for allowing pharmacy to be a part of this patient's care.  Dallie Piles 05/06/2017 12:55 PM

## 2017-05-04 NOTE — Progress Notes (Signed)
Discussed with Dr. Stark Klein in ICU.  Transfer to stepdown unit.

## 2017-05-04 NOTE — ED Notes (Signed)
Raquel RN and Atiya RN attempted to insert an IV catheter twice without success. IV team consult ordered.

## 2017-05-04 NOTE — ED Provider Notes (Signed)
[facility] Emergency Department Provider Note  ____________________________________________   First MD Initiated Contact with Patient 05/22/2017 [phone]     (approximate)  I have reviewed the triage vital signs and the nursing notes.   HISTORY  Chief Complaint Hemoptysis and Abdominal Pain   The history is limited by the patient being a vague historian.   HPI Kathryn Hayes is a 83 y.o. female with extensive chronic medical history as listed below which notably includes CHF and COPD with a full-time oxygen dependence who presents by EMS.  Reportedly she was complaining of abdominal pain at her nursing facility but she is also coughing up small amounts of bright red blood.  It does not seem to be vomit and she denies any nausea at this time.  Her complaint is generalized abdominal pain.  Nothing in particular makes it better or worse and she says it is severe.  It started during the night and feels like both aching and sharp pain.  She denies fever/chills, shortness of breath, chest pain, and dysuria.   Past Medical History:  Diagnosis Date  . Anxiety    unspecified  . Arthritis   . Breast cancer ([place]) 2007   Early stage left breast cancer status post partial colectomy, 04/2005 (Dr. Oliva Bustard)  . Chest pain, unspecified   . CHF (congestive heart failure) ([place])   . Chronic headache   . Colon cancer ([place])    status post partial colectomy, diagnosed 2000  . Colon neoplasm   . COPD (chronic obstructive pulmonary disease) ([place])   . Depression    unspecified  . Diabetes mellitus   . Fibrocystic breast   . History of breast cancer   . History of colon cancer   . HTN (hypertension) with goal to be determined   . Hyperlipidemia   . Hyperlipidemia, unspecified   . Hypertension   . Obesity, unspecified   . Sleep apnea    mild to moderate    Patient Active Problem List   Diagnosis Date Noted  . Tachycardia 03/24/2017  . Sepsis ([place]) 02/23/2017  .  Dependence on supplemental oxygen 03/25/2016  . Primary central sleep apnea 03/25/2016  . Dependence on other enabling machines and devices 03/25/2016  . Presence of cardiac pacemaker 03/25/2016  . Second degree AV block 03/25/2016  . Major depression, single episode 03/25/2016  . Anxiety disorder 03/25/2016  . Vitamin D deficiency 03/25/2016  . Insomnia 03/25/2016  . GERD (gastroesophageal reflux disease) 03/25/2016  . Syndrome of inappropriate antidiuretic hormone ([place]) 03/25/2016  . Migraine without status migrainosus, not intractable 03/25/2016  . Personal history of other malignant neoplasm of large intestine 03/25/2016  . Personal history of malignant neoplasm of breast 03/25/2016  . HTN (hypertension) 12/16/2015  . Chronic systolic CHF (congestive heart failure) ([place]) 08/16/2015  . 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 Barrett's esophagus 01/05/2012  . COPD (chronic obstructive pulmonary disease) ([place])   . Hypertensive heart disease with CHF (congestive heart failure) ([place])   . Hyperlipidemia, unspecified   . Type 2 diabetes, controlled, with neuropathy [facility])     Past Surgical History:  Procedure Laterality Date  . APPENDECTOMY    . BREAST LUMPECTOMY  2007   left  . BUNIONECTOMY Right   . CATARACT EXTRACTION W/ INTRAOCULAR LENS IMPLANT & ANTERIOR VITRECTOMY, BILATERAL Bilateral   . COLON SURGERY  1993  . COLOSTOMY    . DILATION AND CURETTAGE, DIAGNOSTIC / THERAPEUTIC     Fibroid removal  . INSERT REPLACE REMOVE  PACEMAKER     ppm medtronic [id number] Adapta dula chamber rate responsive. 02/21/14  . MASTECTOMY PARTIAL / LUMPECTOMY    . PARTIAL COLECTOMY     Abdominal & Transanal   . TONSILLECTOMY    . TOTAL KNEE ARTHROPLASTY Right 07/03/2013   Right total knee arthroplasty using computer assisted navigation  . TUBAL LIGATION Bilateral 1971   with questionable appendectomy    Prior to Admission medications   Medication Sig Start Date End Date Taking? Authorizing Provider    acetaminophen (TYLENOL) 325 MG tablet Take 650 mg by mouth every 4 (four) hours as needed for mild pain, moderate pain or fever. Do not exceed 3000 mg in 24hrs     [provider]  albuterol (PROVENTIL) (2.5 MG/3ML) 0.083% nebulizer solution Take 2.5 mg by nebulization every 6 (six) hours as needed for wheezing or shortness of breath.     [provider]  Amino Acids-Protein Hydrolys (FEEDING SUPPLEMENT, PRO-STAT SUGAR FREE 64,) LIQD Take 30 mLs by mouth 2 (two) times daily between meals.    [provider]  aspirin EC 81 MG tablet Take 81 mg by mouth daily.     [provider]  azithromycin (ZITHROMAX) 250 MG tablet Take 1 tablet (250 mg total) by mouth daily. 05/02/17   Harvest Dark, MD  bisacodyl (DULCOLAX) 5 MG EC tablet Take 2 tablets (10 mg total) by mouth daily as needed for moderate constipation. 02/18/17   Demetrios Loll, MD  budesonide (PULMICORT) 0.5 MG/2ML nebulizer solution Take 0.5 mg by nebulization 2 (two) times daily.     [provider]  Camphor-Eucalyptus-Menthol (VICKS VAPORUB) 4.7-1.2-2.6 % OINT Apply liberal amount topically to fingernails at bedtime for Onychomycosis    [provider]  Cholecalciferol 2000 units CAPS Take 2,000 Units by mouth daily.    [provider]  clonazePAM (KLONOPIN) 0.5 MG tablet Give 1/2 tablet (0.25 mg) in the AM by mouth  and 1 whole tablet (0.5 mg) in the PM by mouth (Insurance doesn't cover Lorazepam) 03/30/17   Toni Arthurs, NP  divalproex (DEPAKOTE SPRINKLE) 125 MG capsule Take 250 mg by mouth 2 (two) times daily.     [provider]  DULoxetine (CYMBALTA) 60 MG capsule Take 60 mg by mouth daily.     [provider]  feeding supplement, ENSURE ENLIVE, (ENSURE ENLIVE) LIQD Take 237 mLs by mouth 2 (two) times daily between meals. 03/04/17   Gladstone Lighter, MD  fluticasone-salmeterol (ADVAIR HFA) 202-54 MCG/ACT inhaler Inhale 2 puffs into the lungs 2 (two) times  daily. 8 am and 8 pm     [provider]  furosemide (LASIX) 20 MG tablet Take 20 mg by mouth daily.     [provider]  gabapentin (NEURONTIN) 400 MG capsule Take 400 mg by mouth 3 (three) times daily.    [provider]  guaiFENesin (MUCINEX) 600 MG 12 hr tablet Take 1 tablet (600 mg total) by mouth 2 (two) times daily. 03/04/17   Gladstone Lighter, MD  guaiFENesin-dextromethorphan (ROBITUSSIN DM) 100-10 MG/5ML syrup Take 5 mLs by mouth every 4 (four) hours as needed for cough. 02/18/17   Demetrios Loll, MD  ibuprofen (ADVIL,MOTRIN) 600 MG tablet Take 600 mg by mouth every 6 (six) hours as needed for headache.    [provider]  insulin aspart (NOVOLOG) 100 UNIT/ML injection 3 (three) times daily with meals. Per Sliding Scale; If Blood Sugar is 176 to 250, give 3 Units.If Blood Sugar is 251 to 350, give  6 Units.If Blood Sugar is 351 to 450, give 10 Units.  If BS >450, give 12 units and recheck in 1 hour. If still >450, call MD/NP.    [provider]  Ipratropium-Albuterol (COMBIVENT RESPIMAT) 20-100 MCG/ACT AERS respimat Inhale 1 puff into the lungs 4 (four) times daily. RINSE MOUTH AFTER USE....Marland KitchenFOR PNEUMONIA AND BRONCHITIS    [provider]  ketotifen (ZADITOR) 0.025 % ophthalmic solution Place 1 drop into both eyes 2 (two) times daily.     [provider]  magnesium oxide (MAG-OX) 400 MG tablet Take 400 mg by mouth 2 (two) times daily.     [provider]  Melatonin 10 MG TABS Take 10 mg by mouth at bedtime.     [provider]  Menthol 1 MG LOZG Use as directed 1 lozenge in the mouth or throat every 6 (six) hours as needed. For irritation/pain    [provider]  metoCLOPramide (REGLAN) 5 MG tablet Take 5 mg by mouth 4 (four) times daily -  before meals and at bedtime.    [provider]  metoprolol tartrate (LOPRESSOR) 25 MG tablet Take 12.5 mg by mouth 2 (two) times daily.    [provider]   Multiple Vitamins-Minerals (SENIOR TABS) TABS Take 1 tablet by mouth daily.    [provider]  nystatin (MYCOSTATIN/NYSTOP) powder Apply 1 application topically to abdominal folds daily for redness and irritation.    [provider]  pantoprazole (PROTONIX) 20 MG tablet Take 20 mg by mouth daily.    [provider]  potassium chloride (K-DUR,KLOR-CON) 10 MEQ tablet Take 10 mEq by mouth every other day.     [provider]  pravastatin (PRAVACHOL) 10 MG tablet Take 10 mg by mouth daily at 6 PM. On Sunday    [provider]  senna-docusate (SENOKOT-S) 8.6-50 MG tablet Take 1 tablet by mouth 2 (two) times daily.     [provider]  Skin Protectants, Misc. (EUCERIN) cream Apply liberal amount topically to dry areas of skin daily after bath and as needed. Ok to keep at bedside    [provider]  sodium chloride 1 g tablet Take 1 tablet (1 g total) by mouth 2 (two) times daily with a meal. 03/04/17   Gladstone Lighter, MD  sucralfate (CARAFATE) 1 g tablet Take 1 tablet by mouth 4 (four) times daily. 04/27/17   [provider]  trandolapril (MAVIK) 2 MG tablet Take 2 mg by mouth daily.    [provider]    Allergies Patient has no known allergies.  Family History  Problem Relation Age of Onset  . Factor V Leiden deficiency Sister   . Colon polyps Sister   . Breast cancer Mother   . Colon cancer Mother   . Alzheimer's disease Mother   . Prostate cancer Father     Social History Social History   Tobacco Use  . Smoking status: Former Smoker    Packs/day: 1.50    Years: 50.00    Pack years: 75.00    Types: Cigarettes    Last attempt to quit: 10/05/2010    Years since quitting: 6.5  . Smokeless tobacco: Never Used  Substance Use Topics  . Alcohol use: No    Alcohol/week: 1.0 oz    Types: 2 Standard drinks or equivalent per week    Comment: Occasionally  . Drug use: No    Review of  Systems Constitutional: No fever/chills Eyes: No visual changes. ENT:  No sore throat. Cardiovascular: Denies chest pain. Respiratory: Small-volume hemoptysis.  Denies shortness of breath. Gastrointestinal: Generalized abdominal pain as described above with no nausea or vomiting. Genitourinary: Negative for dysuria. Musculoskeletal: Negative for neck pain.  Negative for back pain. Integumentary: Negative for rash. Neurological: Negative for headaches, focal weakness or numbness.   ____________________________________________   PHYSICAL EXAM:  VITAL SIGNS: ED Triage Vitals  Enc Vitals Group     BP 05/18/2017 0622 94/61     Pulse Rate 05/23/2017 0622 99     Resp 05/13/2017 0622 (!) 30     Temp 05/31/2017 0622 97.9 F (36.6 C)     Temp Source 05/09/2017 0622 Oral     SpO2 05/16/2017 0622 96 %     Weight 05/09/2017 0631 104.8 kg (231 lb)     Height 05/31/2017 0631 1.499 m (4\' 11" )     Head Circumference --      Peak Flow --      Pain Score 05/16/2017 0631 9     Pain Loc --      Pain Edu? --      Excl. in [place]? --     Constitutional: Alert and oriented.  Appears chronically ill but not acutely toxic Eyes: Conjunctivae are normal.  Head: Atraumatic. Nose: No congestion/rhinnorhea. Mouth/Throat: Mucous membranes are moist. Neck: No stridor.  No meningeal signs.   Cardiovascular: Borderline tachycardia, paced rhythm. Good peripheral circulation. Grossly normal heart sounds. Respiratory: Normal respiratory effort but increased rate.  No retractions.  Coarse breath sounds throughout with a lot of upper airway noise that sounds like it is coming from her throat. Gastrointestinal: Morbidly obese.  No distention.  Soft and diffusely tender throughout the abdomen. Musculoskeletal: No lower extremity tenderness nor edema. No gross deformities of extremities. Neurologic:  Normal speech and language. No gross focal neurologic deficits are appreciated.  Skin:  Skin is warm, dry and intact. No rash  noted.   ____________________________________________   LABS (all labs ordered are listed, but only abnormal results are displayed)  Labs Reviewed  CULTURE, BLOOD (ROUTINE X 2)  CULTURE, BLOOD (ROUTINE X 2)  COMPREHENSIVE METABOLIC PANEL  LIPASE, BLOOD  BRAIN NATRIURETIC PEPTIDE  TROPONIN I  LACTIC ACID, PLASMA  LACTIC ACID, PLASMA  CBC WITH DIFFERENTIAL/PLATELET  PROTIME-INR  TYPE AND SCREEN   ____________________________________________  EKG  ED ECG REPORT I, Hinda Kehr, the attending physician, personally viewed and interpreted this ECG.  Date: 05/25/2017 EKG Time: 06:21 Rate: 107 Rhythm: mild tachycardia QRS Axis: normal Intervals: normal ST/T Wave abnormalities: Non-specific ST segment / T-wave changes, but no evidence of acute ischemia. Narrative Interpretation: no evidence of acute ischemia   ____________________________________________  RADIOLOGY I, Hinda Kehr, personally viewed and evaluated these images (plain radiographs) as part of my medical decision making, as well as reviewing the written report by the radiologist.  ED MD interpretation:  Possible left pleural effusion, no obvious acute abnormality  Official radiology report(s): Dg Chest Portable 1 View  Result Date: 05/31/2017 CLINICAL DATA:  Abdominal pain, cough, and bloody sputum. EXAM: PORTABLE CHEST 1 VIEW COMPARISON:  05/02/2017 FINDINGS: Cardiac pacemaker. Shallow inspiration with atelectasis in the lung bases. Cardiac enlargement. No vascular congestion, edema, or consolidation. Hazy appearance of the left costophrenic angle may represent small effusion. Calcified and tortuous aorta. No pneumothorax. IMPRESSION: Shallow inspiration with atelectasis in the lung bases. Possible left pleural effusion. Cardiac enlargement. No edema or consolidation. Aortic atherosclerosis. Electronically Signed   By: Lucienne Capers M.D.   On: 05/26/2017  06:45     ____________________________________________   PROCEDURES  Critical Care performed: No   Procedure(s) performed:   Procedures   ____________________________________________   INITIAL IMPRESSION / ASSESSMENT AND PLAN / ED COURSE  As part of my medical decision making, I reviewed the following data within the [place] notes reviewed and incorporated, Labs reviewed , EKG interpreted , Patient signed out to  and Radiograph reviewed     Differential diagnosis includes, but is not limited to, healthcare associated pneumonia/sepsis, neoplasm, hematemesis versus hemoptysis,etc. patient does not have a fever, is not tachycardic (although she does have a pacer), and is not feeling short of breath in spite of the bright red blood that she has occasionally coughed up in route according to the paramedics.  She reports only severe abdominal pain.  We will evaluate broadly including standard lab work, lactic acid, type and screen, and a portable chest x-ray while awaiting the results of her kidney function test.  She will likely benefit from a CT scan of her chest, abdomen, and pelvis, preferably with IV contrast.  EKG demonstrates paced rhythm, otherwise unremarkable.   Clinical Course as of May 05 723  Wed May 04, 2017  0711 Possible pleural effusion on chest x-ray, otherwise no obvious acute findings.  IV access was difficult but the patient now has a peripheral IV in her left arm.  Small amount of blood was sent but the lab is going to draw additional blood for the rest of the work-up.  Her blood pressure is 122/90 and she is stable, denies shortness of breath, is not actively coughing up or vomiting blood at this time, but does report persistent abdominal pain. Transferring ED care to Dr. Burlene Arnt.  Labs pending, CT scans ordered but will need to wait until after renal function has been determined.   [CF]    Clinical Course User Index [CF] Hinda Kehr, MD     ____________________________________________  FINAL CLINICAL IMPRESSION(S) / ED DIAGNOSES  Final diagnoses:  Hemoptysis  Generalized abdominal pain     MEDICATIONS GIVEN DURING THIS VISIT:  Medications  sodium chloride 0.9 % bolus 1,000 mL (1,000 mLs Intravenous New Bag/Given 05/09/2017 0708)     ED Discharge Orders    None       Note:  This document was prepared using Dragon voice recognition software and may include unintentional dictation errors.    Hinda Kehr, MD 05/28/2017 [phone]

## 2017-05-04 NOTE — Consult Note (Signed)
[facility] GI Inpatient Consult Note   Kathline Magic, M.D.  Reason for Consult: Upper GI bleed   Attending Requesting Consult: Cassandria Santee, M.D.  Outpatient Primary Physician: Juluis Pitch, M.D.  History of Present Illness: Kathryn Hayes is a 83 y.o. female with a personal history of breast cancer, colon cancer status post partial colectomy (2000) diabetes mellitus, COPD, hypertension and congestive heart failure who is admitted for spitting up blood and abdominal pain.  She had been seen by her physician previously and the pain had resolved without further intervention.  The patient's son and daughter-in-law report the patient has had a decreased appetite over this time. The patient was on a regular floor, she became more lethargic.  A serum lactate acid level was 10 and she was transferred to the stepdown unit.  She then had significant loss of response with hypotension and required intubation and mechanical ventilation for respiratory failure.  It is noted the patient has sepsis most likely from the genitourinary tract, cystitis.  CT scan revealed large mass in the stomach of unknown etiology, ?Gastric bezoar vs. malignancy.  GI was called for patient having fresh blood aspirated when orogastric tube was placed after endotracheal intubation.  The patient currently is minimally responsive but multiple pressors have been started including Neo-Synephrine and levo fed.    Past Medical History:  Past Medical History:  Diagnosis Date  . Anxiety    unspecified  . Arthritis   . Breast cancer ([place]) 2007   Early stage left breast cancer status post partial colectomy, 04/2005 (Dr. Oliva Bustard)  . Chest pain, unspecified   . CHF (congestive heart failure) ([place])   . Chronic headache   . Colon cancer ([place])    status post partial colectomy, diagnosed 2000  . Colon neoplasm   . COPD (chronic obstructive pulmonary disease) ([place])   . Depression    unspecified  . Diabetes mellitus   .  Fibrocystic breast   . History of breast cancer   . History of colon cancer   . HTN (hypertension) with goal to be determined   . Hyperlipidemia   . Hyperlipidemia, unspecified   . Hypertension   . Obesity, unspecified   . Sleep apnea    mild to moderate    Problem List: Patient Active Problem List   Diagnosis Date Noted  . Tachycardia 03/24/2017  . Sepsis ([place]) 02/23/2017  . Dependence on supplemental oxygen 03/25/2016  . Primary central sleep apnea 03/25/2016  . Dependence on other enabling machines and devices 03/25/2016  . Presence of cardiac pacemaker 03/25/2016  . Second degree AV block 03/25/2016  . Major depression, single episode 03/25/2016  . Anxiety disorder 03/25/2016  . Vitamin D deficiency 03/25/2016  . Insomnia 03/25/2016  . GERD (gastroesophageal reflux disease) 03/25/2016  . Syndrome of inappropriate antidiuretic hormone ([place]) 03/25/2016  . Migraine without status migrainosus, not intractable 03/25/2016  . Personal history of other malignant neoplasm of large intestine 03/25/2016  . Personal history of malignant neoplasm of breast 03/25/2016  . HTN (hypertension) 12/16/2015  . Chronic systolic CHF (congestive heart failure) ([place]) 08/16/2015  . 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 Barrett's esophagus 01/05/2012  . COPD (chronic obstructive pulmonary disease) ([place])   . Hypertensive heart disease with CHF (congestive heart failure) ([place])   . Hyperlipidemia, unspecified   . Type 2 diabetes, controlled, with neuropathy [facility])     Past Surgical History: Past Surgical History:  Procedure Laterality Date  . APPENDECTOMY    . BREAST LUMPECTOMY  2007   left  . BUNIONECTOMY Right   . CATARACT EXTRACTION W/ INTRAOCULAR LENS IMPLANT & ANTERIOR VITRECTOMY, BILATERAL Bilateral   . COLON SURGERY  1993  . COLOSTOMY    . DILATION AND CURETTAGE, DIAGNOSTIC / THERAPEUTIC     Fibroid removal  . INSERT REPLACE REMOVE PACEMAKER     ppm medtronic [id number] Adapta dula chamber  rate responsive. 02/21/14  . MASTECTOMY PARTIAL / LUMPECTOMY    . PARTIAL COLECTOMY     Abdominal & Transanal   . TONSILLECTOMY    . TOTAL KNEE ARTHROPLASTY Right 07/03/2013   Right total knee arthroplasty using computer assisted navigation  . TUBAL LIGATION Bilateral 1971   with questionable appendectomy    Allergies: No Known Allergies  Home Medications: Medications Prior to Admission  Medication Sig Dispense Refill Last Dose  . albuterol (PROVENTIL) (2.5 MG/3ML) 0.083% nebulizer solution Take 2.5 mg by nebulization every 6 (six) hours as needed for wheezing or shortness of breath.    05/03/2017 at Unknown time  . aspirin EC 81 MG tablet Take 81 mg by mouth daily.    05/03/2017 at Unknown time  . azithromycin (ZITHROMAX) 250 MG tablet Take 1 tablet (250 mg total) by mouth daily. 4 each 0 05/03/2017 at Unknown time  . budesonide (PULMICORT) 0.5 MG/2ML nebulizer solution Take 0.5 mg by nebulization 2 (two) times daily.    05/03/2017 at Unknown time  . Camphor-Eucalyptus-Menthol (VICKS VAPORUB) 4.7-1.2-2.6 % OINT Apply liberal amount topically to fingernails at bedtime for Onychomycosis   05/03/2017 at Unknown time  . Cholecalciferol 2000 units CAPS Take 2,000 Units by mouth daily.   05/03/2017 at Unknown time  . clonazePAM (KLONOPIN) 0.5 MG tablet Give 1/2 tablet (0.25 mg) in the AM by mouth  and 1 whole tablet (0.5 mg) in the PM by mouth [employer] doesn't cover Lorazepam) 45 tablet 1 05/03/2017 at Unknown time  . divalproex (DEPAKOTE SPRINKLE) 125 MG capsule Take 250 mg by mouth 2 (two) times daily.    05/03/2017 at Unknown time  . DULoxetine (CYMBALTA) 60 MG capsule Take 60 mg by mouth daily.    05/03/2017 at Unknown time  . feeding supplement, ENSURE ENLIVE, (ENSURE ENLIVE) LIQD Take 237 mLs by mouth 2 (two) times daily between meals. 237 mL 12 05/03/2017 at Unknown time  . fluticasone-salmeterol (ADVAIR HFA) 115-21 MCG/ACT inhaler Inhale 2 puffs into the lungs 2 (two) times daily. 8 am and 8 pm     05/03/2017 at Unknown time  . furosemide (LASIX) 20 MG tablet Take 20 mg by mouth daily.    05/03/2017 at Unknown time  . gabapentin (NEURONTIN) 400 MG capsule Take 400 mg by mouth 3 (three) times daily.   05/03/2017 at Unknown time  . insulin aspart (NOVOLOG) 100 UNIT/ML injection 3 (three) times daily with meals. Per Sliding Scale; If Blood Sugar is 176 to 250, give 3 Units.If Blood Sugar is 251 to 350, give 6 Units.If Blood Sugar is 351 to 450, give 10 Units.  If BS >450, give 12 units and recheck in 1 hour. If still >450, call MD/NP.   05/03/2017 at Unknown time  . Ipratropium-Albuterol (COMBIVENT RESPIMAT) 20-100 MCG/ACT AERS respimat Inhale 1 puff into the lungs 4 (four) times daily. RINSE MOUTH AFTER USE....Marland KitchenFOR PNEUMONIA AND BRONCHITIS   05/03/2017 at Unknown time  . ketotifen (ZADITOR) 0.025 % ophthalmic solution Place 1 drop into both eyes 2 (two) times daily.    05/03/2017 at Unknown time  . magnesium oxide (MAG-OX) 400 MG  tablet Take 400 mg by mouth 2 (two) times daily.    05/03/2017 at Unknown time  . Melatonin 10 MG TABS Take 10 mg by mouth at bedtime.    05/03/2017 at Unknown time  . Menthol 1 MG LOZG Use as directed 1 lozenge in the mouth or throat every 6 (six) hours as needed. For irritation/pain   05/03/2017 at Unknown time  . metoCLOPramide (REGLAN) 5 MG tablet Take 5 mg by mouth 4 (four) times daily -  before meals and at bedtime.   05/03/2017 at Unknown time  . metoprolol tartrate (LOPRESSOR) 25 MG tablet Take 12.5 mg by mouth 2 (two) times daily.   05/03/2017 at Unknown time  . Multiple Vitamins-Minerals (SENIOR TABS) TABS Take 1 tablet by mouth daily.   05/03/2017 at Unknown time  . nystatin (MYCOSTATIN/NYSTOP) powder Apply 1 application topically to abdominal folds daily for redness and irritation.   05/03/2017 at Unknown time  . pantoprazole (PROTONIX) 20 MG tablet Take 20 mg by mouth daily.   05/03/2017 at Unknown time  . potassium chloride (K-DUR,KLOR-CON) 10 MEQ tablet Take 10 mEq by  mouth every other day.    Past Week at Unknown time  . pravastatin (PRAVACHOL) 10 MG tablet Take 10 mg by mouth daily at 6 PM. On Sunday   05/03/2017 at Unknown time  . senna-docusate (SENOKOT-S) 8.6-50 MG tablet Take 1 tablet by mouth 2 (two) times daily.    05/03/2017 at Unknown time  . sodium chloride 1 g tablet Take 1 tablet (1 g total) by mouth 2 (two) times daily with a meal. 60 tablet 1 05/03/2017 at Unknown time  . sucralfate (CARAFATE) 1 g tablet Take 1 tablet by mouth 4 (four) times daily.   05/03/2017 at Unknown time  . trandolapril (MAVIK) 2 MG tablet Take 2 mg by mouth daily.   05/03/2017 at Unknown time  . acetaminophen (TYLENOL) 325 MG tablet Take 650 mg by mouth every 4 (four) hours as needed for mild pain, moderate pain or fever. Do not exceed 3000 mg in 24hrs    prn at prn  . Amino Acids-Protein Hydrolys (FEEDING SUPPLEMENT, PRO-STAT SUGAR FREE 64,) LIQD Take 30 mLs by mouth 2 (two) times daily between meals.   Taking  . bisacodyl (DULCOLAX) 5 MG EC tablet Take 2 tablets (10 mg total) by mouth daily as needed for moderate constipation. 30 tablet 0 prn at prn  . guaiFENesin (MUCINEX) 600 MG 12 hr tablet Take 1 tablet (600 mg total) by mouth 2 (two) times daily. (Patient not taking: Reported on 06/02/2017) 30 tablet 0 Not Taking at Unknown time  . guaiFENesin-dextromethorphan (ROBITUSSIN DM) 100-10 MG/5ML syrup Take 5 mLs by mouth every 4 (four) hours as needed for cough. 118 mL 0 prn at prn  . ibuprofen (ADVIL,MOTRIN) 600 MG tablet Take 600 mg by mouth every 6 (six) hours as needed for headache.   prn at prn  . Skin Protectants, Misc. (EUCERIN) cream Apply liberal amount topically to dry areas of skin daily after bath and as needed. Ok to keep at bedside   prn at prn   Home medication reconciliation was completed with the patient.   Scheduled Inpatient Medications:   . budesonide  0.5 mg Nebulization BID  . dextrose  100 mL Intravenous Once  . dextrose  3 ampule Intravenous Once  .  dextrose  1 ampule Intravenous Once  . dextrose  1 ampule Intravenous Once  . dextrose  1 ampule Intravenous Once  .  dextrose  1 ampule Intravenous Once  . dextrose      . dextrose      . dextrose      . dextrose      . dextrose      . divalproex  250 mg Oral BID  . DULoxetine  60 mg Oral Daily  . etomidate      . etomidate  20 mg Intravenous Once  . feeding supplement (PRO-STAT SUGAR FREE 64)  30 mL Oral BID BM  . gabapentin  400 mg Oral TID  . hydrocortisone sod succinate (SOLU-CORTEF) inj  100 mg Intravenous Q8H  . ipratropium-albuterol  3 mL Nebulization QID  . ketotifen  1 drop Both Eyes BID  . magnesium oxide  400 mg Oral BID  . mouth rinse  15 mL Mouth Rinse BID  . metoCLOPramide  5 mg Oral TID AC & HS  . nystatin   Topical Daily  . octreotide  50 mcg Subcutaneous Q6H  . pantoprazole (PROTONIX) IV  40 mg Intravenous Q12H  . senna-docusate  1 tablet Oral BID  . sodium bicarbonate  100 mEq Intravenous Once  . sodium bicarbonate  100 mEq Intravenous Once  . sodium bicarbonate  50 mEq Intravenous Once  . sodium bicarbonate  50 mEq Intravenous Once  . sterile water (preservative free)      . sucralfate  1 g Oral TID AC & HS    Continuous Inpatient Infusions:   . sodium chloride    . sodium chloride    . dextrose 250 mL/hr at 05/17/2017 1711  . epinephrine 5 mcg/min (05/31/2017 1808)  . fentaNYL 100 mcg/hr (05/08/2017 1709)  . norepinephrine (LEVOPHED) Adult infusion    . phenylephrine (NEO-SYNEPHRINE) Adult infusion    . piperacillin-tazobactam (ZOSYN)  IV    .  sodium bicarbonate (isotonic) infusion in sterile water 100 mL/hr at 05/25/2017 1807  . vancomycin    . vasopressin (PITRESSIN) infusion - *FOR SHOCK*      PRN Inpatient Medications:  acetaminophen **OR** acetaminophen, albuterol, clonazePAM, guaiFENesin-dextromethorphan, menthol-cetylpyridinium, ondansetron **OR** ondansetron (ZOFRAN) IV, polyethylene glycol  Family History: family history includes Alzheimer's  disease in her mother; Breast cancer in her mother; Colon cancer in her mother; Colon polyps in her sister; Factor V Leiden deficiency in her sister; Prostate cancer in her father.   GI Family History: As above.   Social History:   reports that she quit smoking about 6 years ago. Her smoking use included cigarettes. She has a 75.00 pack-year smoking history. She has never used smokeless tobacco. She reports that she does not drink alcohol or use drugs. The patient denies ETOH, tobacco, or drug use.    Review of Systems: Review of Systems - Unobtainable except that obtained from family in the HPI.  Physical Examination: BP (!) 100/50   Pulse (!) 142   Temp (!) 97.5 F (36.4 C) (Axillary)   Resp (!) 23   Ht 4\' 11"  (1.499 m)   Wt 96.1 kg (211 lb 13.8 oz)   SpO2 (!) 89%   BMI 42.79 kg/m  Physical Exam  Data: Lab Results  Component Value Date   WBC 18.4 (H) 05/29/2017   HGB 7.3 (L) 05/20/2017   HCT 23.5 (L) 05/22/2017   MCV 91.2 05/13/2017   PLT 353 05/24/2017   Recent Labs  Lab 05/02/17 1832 05/26/2017 0641 05/25/2017 1638  HGB 12.1 10.6* 7.3*   Lab Results  Component Value Date   NA 135 05/26/2017   K 5.1  05/31/2017   CL 95 (L) 05/15/2017   CO2 28 05/06/2017   BUN 16 05/10/2017   CREATININE 0.62 06/01/2017   GLU 141 09/29/2015   Lab Results  Component Value Date   ALT 17 05/24/2017   AST 71 (H) 05/06/2017   ALKPHOS 78 05/29/2017   BILITOT 0.6 05/28/2017   Recent Labs  Lab 05/20/2017 0738  INR 1.25   CBC Latest Ref Rng & Units 05/15/2017 05/10/2017 05/02/2017  WBC 3.6 - 11.0 K/uL - 18.4(H) 12.6(H)  Hemoglobin 12.0 - 16.0 g/dL 7.3(L) 10.6(L) 12.1  Hematocrit 35.0 - 47.0 % 23.5(L) 31.8(L) 36.5  Platelets 150 - 440 K/uL - 353 334    STUDIES: Dg Chest 2 View  Result Date: 05/02/2017 CLINICAL DATA:  Pt arrived via ems - she denies any pain or shortness of breath - the family went to visit her at the nursing home and they stated that she was breathing to slow and  they wanted her to be evaluated EXAM: CHEST - 2 VIEW COMPARISON:  02/27/2017 FINDINGS: Progressive small pleural effusions with adjacent consolidation/atelectasis in the lower lobes left greater than right. Stable cardiomegaly. Aortic Atherosclerosis (ICD10-170.0) Stable left subclavian transvenous pacemaker. Surgical clips in the left axilla. No pneumothorax. Visualized bones unremarkable. IMPRESSION: 1. Increase in bibasilar atelectasis/consolidation with small effusions, left greater than right. Electronically Signed   By: Lucrezia Europe M.D.   On: 05/02/2017 19:02   Dg Abd 1 View  Result Date: 05/16/2017 CLINICAL DATA:  Nasogastric tube placement and intubation. History of breast cancer, colon cancer, congestive heart failure and diabetes. EXAM: ABDOMEN - 1 VIEW COMPARISON:  CT earlier the same date.  Radiographs 02/16/2017. FINDINGS: 1545 hours. Nasogastric tube projects to the distal stomach. There is prominent stool throughout the colon which is mildly dilated diffusely. Probable rectal prolapse and gastric bezoar as correlated with previous CT. Vascular calcifications and mild spondylosis noted. IMPRESSION: Nasogastric tube extends into the distal stomach. Prominent stool throughout the colon, similar to previous CT. Electronically Signed   By: Richardean Sale M.D.   On: 05/25/2017 16:29   Ct Chest W Contrast  Result Date: 05/21/2017 CLINICAL DATA:  Hemoptysis versus hematemesis. Abdominal pain, cough. EXAM: CT CHEST, ABDOMEN, AND PELVIS WITH CONTRAST TECHNIQUE: Multidetector CT imaging of the chest, abdomen and pelvis was performed following the standard protocol during bolus administration of intravenous contrast. CONTRAST:  42mL ISOVUE-370 IOPAMIDOL (ISOVUE-370) INJECTION 76% COMPARISON:  Chest CT 03/02/2017.  Abdominal CT 05/17/2007. FINDINGS: CT CHEST FINDINGS Cardiovascular: Moderate coronary artery calcifications in the aortic arch and descending thoracic aorta. Moderate scattered coronary artery  calcifications. Pacer wires noted in the right atrium and right ventricle. Heart is normal size. Aorta is normal caliber. No evidence of aortic dissection. Mediastinum/Nodes: No mediastinal, hilar, or axillary adenopathy. The esophagus is fluid-filled. Lungs/Pleura: Small bilateral pleural effusions. Compressive atelectasis in the lower lobes. Musculoskeletal: No acute bony abnormality. CT ABDOMEN PELVIS FINDINGS Hepatobiliary: Fatty infiltration of the liver. No focal abnormality or biliary ductal dilatation. Gallbladder unremarkable. Pancreas: No focal abnormality or ductal dilatation. Mildly atrophic. Spleen: No focal abnormality.  Normal size. Adrenals/Urinary Tract: Areas of cortical thinning bilaterally. No hydronephrosis. Cysts noted in the mid to lower pole of the left kidney. No adrenal mass or visible urinary bladder abnormality. Stomach/Bowel: Large stool burden throughout the colon. Scattered sigmoid diverticula. Large masslike filling defect noted in the stomach which appears to be separate from the gastric wall. This contains gas within this large intraluminal mass. Fluid and gas surrounding mass. I  favor this represents a bezoar. No visible gastric wall abnormality or gastric outlet obstruction. Small bowel is decompressed. Vascular/Lymphatic: Heavily calcified aorta and branch vessels. No aneurysm or adenopathy. Reproductive: Probable anterior uterine fibroid, best seen on sagittal imaging, measuring up to 3.2 cm. No adnexal mass. Other: No free fluid or free air. Musculoskeletal: No acute bony abnormality. IMPRESSION: Small bilateral pleural effusions with compressive atelectasis in the lower lobes. Coronary artery disease, aortic atherosclerosis. Large intraluminal filling defect within the stomach, question bezoar. No visible gastric wall abnormality. This could be further evaluated with direct visualization/endoscopy. Esophagus is fluid-filled possibly related to reflux or dysmotility. Large  stool burden throughout the colon. Fatty infiltration of the liver. Aortoiliac atherosclerosis. Electronically Signed   By: Rolm Baptise M.D.   On: 05/23/2017 09:52   Ct Abdomen Pelvis W Contrast  Result Date: 05/31/2017 CLINICAL DATA:  Hemoptysis versus hematemesis. Abdominal pain, cough. EXAM: CT CHEST, ABDOMEN, AND PELVIS WITH CONTRAST TECHNIQUE: Multidetector CT imaging of the chest, abdomen and pelvis was performed following the standard protocol during bolus administration of intravenous contrast. CONTRAST:  84mL ISOVUE-370 IOPAMIDOL (ISOVUE-370) INJECTION 76% COMPARISON:  Chest CT 03/02/2017.  Abdominal CT 05/17/2007. FINDINGS: CT CHEST FINDINGS Cardiovascular: Moderate coronary artery calcifications in the aortic arch and descending thoracic aorta. Moderate scattered coronary artery calcifications. Pacer wires noted in the right atrium and right ventricle. Heart is normal size. Aorta is normal caliber. No evidence of aortic dissection. Mediastinum/Nodes: No mediastinal, hilar, or axillary adenopathy. The esophagus is fluid-filled. Lungs/Pleura: Small bilateral pleural effusions. Compressive atelectasis in the lower lobes. Musculoskeletal: No acute bony abnormality. CT ABDOMEN PELVIS FINDINGS Hepatobiliary: Fatty infiltration of the liver. No focal abnormality or biliary ductal dilatation. Gallbladder unremarkable. Pancreas: No focal abnormality or ductal dilatation. Mildly atrophic. Spleen: No focal abnormality.  Normal size. Adrenals/Urinary Tract: Areas of cortical thinning bilaterally. No hydronephrosis. Cysts noted in the mid to lower pole of the left kidney. No adrenal mass or visible urinary bladder abnormality. Stomach/Bowel: Large stool burden throughout the colon. Scattered sigmoid diverticula. Large masslike filling defect noted in the stomach which appears to be separate from the gastric wall. This contains gas within this large intraluminal mass. Fluid and gas surrounding mass. I favor this  represents a bezoar. No visible gastric wall abnormality or gastric outlet obstruction. Small bowel is decompressed. Vascular/Lymphatic: Heavily calcified aorta and branch vessels. No aneurysm or adenopathy. Reproductive: Probable anterior uterine fibroid, best seen on sagittal imaging, measuring up to 3.2 cm. No adnexal mass. Other: No free fluid or free air. Musculoskeletal: No acute bony abnormality. IMPRESSION: Small bilateral pleural effusions with compressive atelectasis in the lower lobes. Coronary artery disease, aortic atherosclerosis. Large intraluminal filling defect within the stomach, question bezoar. No visible gastric wall abnormality. This could be further evaluated with direct visualization/endoscopy. Esophagus is fluid-filled possibly related to reflux or dysmotility. Large stool burden throughout the colon. Fatty infiltration of the liver. Aortoiliac atherosclerosis. Electronically Signed   By: Rolm Baptise M.D.   On: 05/22/2017 09:52   Dg Chest Port 1 View  Result Date: 05/26/2017 CLINICAL DATA:  Nasogastric tube placement and intubation. History of breast cancer, colon cancer, congestive heart failure and diabetes. EXAM: PORTABLE CHEST 1 VIEW COMPARISON:  CT and radiographs earlier today. Radiographs 05/02/2017. FINDINGS: 1545 hours. Mild patient rotation to the right. Endotracheal tube has been placed, terminating at the level of the mid trachea. There is a right IJ central venous catheter, the tip of which is not confidently identified due to overlap with  the left subclavian pacemaker leads. The pacemaker leads appear unchanged. The heart size and mediastinal contours are stable allowing for patient rotation. There are persistent bilateral pleural effusions and bibasilar atelectasis. No evidence of pneumothorax. IMPRESSION: Satisfactory position of the endotracheal tube. Right IJ central venous catheter tip not well visualized. No evidence of pneumothorax or significant change in the  pleural effusions. Electronically Signed   By: Richardean Sale M.D.   On: 05/20/2017 16:24   Dg Chest Portable 1 View  Result Date: 05/20/2017 CLINICAL DATA:  Abdominal pain, cough, and bloody sputum. EXAM: PORTABLE CHEST 1 VIEW COMPARISON:  05/02/2017 FINDINGS: Cardiac pacemaker. Shallow inspiration with atelectasis in the lung bases. Cardiac enlargement. No vascular congestion, edema, or consolidation. Hazy appearance of the left costophrenic angle may represent small effusion. Calcified and tortuous aorta. No pneumothorax. IMPRESSION: Shallow inspiration with atelectasis in the lung bases. Possible left pleural effusion. Cardiac enlargement. No edema or consolidation. Aortic atherosclerosis. Electronically Signed   By: Lucienne Capers M.D.   On: 05/18/2017 06:45   [place] Ekg Site Rite  Result Date: 05/14/2017 If Site Rite image not attached, placement could not be confirmed due to current cardiac rhythm.  [place] Ekg Site Rite  Result Date: 05/25/2017 If Site Rite image not attached, placement could not be confirmed due to current cardiac rhythm.  @IMAGES @  Assessment:  1. UGI bleed - DDx includes Mallory Weiss tear, Peptic ulcer disease, Gastric or esophageal malignancy, Severe esophagitis, etc.  2. Septic shock - On pressors.  3. Respiratory failure - On mechanical ventiliation.  4. Gastroparesis - Increases likelihood of gastric mass being a Bezoar.  5. Abnormal CT of the stomach - Gastric mass.  6. Grave prognosis.    Recommendations: 1. Continue current treatment. 2. Add acid suppression IV. 3. I had a [place] discussion with all family members, including 2 sons, in-laws, 1 daughter, grandchildren. I told them that while it was technically feasible to perform EGD, the patient is unstable and will unlikely tolerate endoscopy well. I proposed the option of waiting and treating empirically with IV acid suppression. I also presented the grave condition of sepsis and that this would not  necessarily improve, but perhaps may exacerbate her sepsis condition. Family have decided to forego procedures at this time and treat supportively. 4. Further decisions on CODE status to be discussed with intensivist on duty, Dr. Soyla Murphy.   Will follow along.   Thank you for the consult. Please call with questions or concerns.  Olean Ree, "Lanny Hurst MD [facility] [place], [place] [address] [phone]  05/10/2017 6:49 PM

## 2017-05-04 NOTE — ED Notes (Signed)
Lab at bedside to collect blood, pt difficult stick

## 2017-05-04 NOTE — Progress Notes (Signed)
Reassessment  Patient worsening with her blood pressure being low at 86/40.  Lactic acid continues to be elevated at 7.8.   Patient seen and examined.  Continues to feel weak with abdominal pain.  On 3 L liters oxygen.  S1, S2 Lungs clear to auscultation Epigastric tenderness Lower extremity edema  Repeat blood pressure shows 67/41  *Severe sepsis.  Will give 500 normal saline bolus stat.  Discussed with Dr. Andrena Mews in ICU.  Transfer.  Broad-spectrum IV antibiotics.  Cultures pending.  Discussed with son again at length.  Explained the critical nature of illness.  High risk for deterioration and death with multiple comorbidities.  Will have to start pressors if blood pressure does not respond to IV fluids.  Critical care Time spent 35 minutes

## 2017-05-04 NOTE — Procedures (Signed)
Endotracheal Intubation  Indication: Unresponsiveness with poor breathing effort  Emergency procedure  Patient was successively intubated was 8 FR ETT using glide scop. Placement was confirmed with CO2 detector, bilateral equal air entry and chest x-ray. Tolerated the procedure and no immediate complications

## 2017-05-04 NOTE — Progress Notes (Signed)
Upon arrival to the unit pt had BP 89/74 and lactic acid 7.8 Report called to RN in CCU.

## 2017-05-04 NOTE — Progress Notes (Signed)
Assessed arm for Rt. PICC placement.  PICC would occupy 183% of cephalic vein; over 437% of basilic vein; unable to track brachial vein up arm; what can be visualized is very small and would have similar occupies. Unable to use left arm due to pacer.  Unable to place PICC.

## 2017-05-05 ENCOUNTER — Inpatient Hospital Stay (HOSPITAL_COMMUNITY): Payer: Medicare Other

## 2017-05-05 ENCOUNTER — Inpatient Hospital Stay: Payer: Medicare Other

## 2017-05-05 DIAGNOSIS — R4182 Altered mental status, unspecified: Secondary | ICD-10-CM

## 2017-05-05 LAB — BASIC METABOLIC PANEL
ANION GAP: 14 (ref 5–15)
ANION GAP: 11 (ref 5–15)
ANION GAP: 12 (ref 5–15)
BUN: 17 mg/dL (ref 6–20)
BUN: 15 mg/dL (ref 6–20)
BUN: 13 mg/dL (ref 6–20)
CALCIUM: 6.2 mg/dL — AB (ref 8.9–10.3)
CALCIUM: 6 mg/dL — AB (ref 8.9–10.3)
CALCIUM: 6.8 mg/dL — AB (ref 8.9–10.3)
CHLORIDE: 96 mmol/L — AB (ref 101–111)
CO2: 24 mmol/L (ref 22–32)
CO2: 26 mmol/L (ref 22–32)
CO2: 26 mmol/L (ref 22–32)
Chloride: 93 mmol/L — ABNORMAL LOW (ref 101–111)
Chloride: 98 mmol/L — ABNORMAL LOW (ref 101–111)
Creatinine, Ser: 1.32 mg/dL — ABNORMAL HIGH (ref 0.44–1.00)
Creatinine, Ser: 1.13 mg/dL — ABNORMAL HIGH (ref 0.44–1.00)
Creatinine, Ser: 1.06 mg/dL — ABNORMAL HIGH (ref 0.44–1.00)
GFR calc Af Amer: 43 mL/min — ABNORMAL LOW (ref >60)
GFR calc Af Amer: 51 mL/min — ABNORMAL LOW (ref >60)
GFR calc non Af Amer: 44 mL/min — ABNORMAL LOW (ref >60)
GFR calc non Af Amer: 48 mL/min — ABNORMAL LOW (ref >60)
GFR, EST AFRICAN AMERICAN: 56 mL/min — AB (ref >60)
GFR, EST NON AFRICAN AMERICAN: 37 mL/min — AB (ref >60)
GLUCOSE: 538 mg/dL — AB (ref 65–99)
GLUCOSE: 210 mg/dL — AB (ref 65–99)
Glucose, Bld: 389 mg/dL — ABNORMAL HIGH (ref 65–99)
POTASSIUM: 3.3 mmol/L — AB (ref 3.5–5.1)
POTASSIUM: 3.7 mmol/L (ref 3.5–5.1)
Potassium: 4.2 mmol/L (ref 3.5–5.1)
SODIUM: 131 mmol/L — AB (ref 135–145)
Sodium: 133 mmol/L — ABNORMAL LOW (ref 135–145)
Sodium: 136 mmol/L (ref 135–145)

## 2017-05-05 LAB — LACTIC ACID, PLASMA
Lactic Acid, Venous: 8.2 mmol/L (ref 0.5–1.9)
Lactic Acid, Venous: 6.5 mmol/L (ref 0.5–1.9)

## 2017-05-05 LAB — HEMOGLOBIN AND HEMATOCRIT, BLOOD
HCT: 34.5 % — ABNORMAL LOW (ref 35.0–47.0)
HCT: 41.6 % (ref 35.0–47.0)
HCT: 38.7 % (ref 35.0–47.0)
HCT: 32.1 % — ABNORMAL LOW (ref 35.0–47.0)
HCT: 31.1 % — ABNORMAL LOW (ref 35.0–47.0)
HEMOGLOBIN: 12.7 g/dL (ref 12.0–16.0)
Hemoglobin: 10.5 g/dL — ABNORMAL LOW (ref 12.0–16.0)
Hemoglobin: 13.1 g/dL (ref 12.0–16.0)
Hemoglobin: 10.7 g/dL — ABNORMAL LOW (ref 12.0–16.0)
Hemoglobin: 10.3 g/dL — ABNORMAL LOW (ref 12.0–16.0)

## 2017-05-05 LAB — BLOOD GAS, VENOUS
PO2 VEN: 35 mmHg (ref 32.0–45.0)
Patient temperature: 37
pCO2, Ven: 57 mmHg (ref 44.0–60.0)
pH, Ven: 7.25 (ref 7.250–7.430)

## 2017-05-05 LAB — BPAM RBC
Blood Product Expiration Date: 201905212359
Blood Product Expiration Date: 201905212359
ISSUE DATE / TIME: 201905011844
ISSUE DATE / TIME: 201905012212
UNIT TYPE AND RH: 6200
Unit Type and Rh: 6200

## 2017-05-05 LAB — COMPREHENSIVE METABOLIC PANEL
ALBUMIN: 1.5 g/dL — AB (ref 3.5–5.0)
ALT: 2380 U/L — AB (ref 14–54)
AST: 6630 U/L — AB (ref 15–41)
Alkaline Phosphatase: 96 U/L (ref 38–126)
Anion gap: 16 — ABNORMAL HIGH (ref 5–15)
BILIRUBIN TOTAL: 0.9 mg/dL (ref 0.3–1.2)
BUN: 17 mg/dL (ref 6–20)
CHLORIDE: 93 mmol/L — AB (ref 101–111)
CO2: 23 mmol/L (ref 22–32)
CREATININE: 1.14 mg/dL — AB (ref 0.44–1.00)
Calcium: 6.3 mg/dL — CL (ref 8.9–10.3)
GFR calc Af Amer: 51 mL/min — ABNORMAL LOW (ref >60)
GFR, EST NON AFRICAN AMERICAN: 44 mL/min — AB (ref >60)
GLUCOSE: 619 mg/dL — AB (ref 65–99)
Potassium: 4 mmol/L (ref 3.5–5.1)
Sodium: 132 mmol/L — ABNORMAL LOW (ref 135–145)
TOTAL PROTEIN: 3.8 g/dL — AB (ref 6.5–8.1)

## 2017-05-05 LAB — TYPE AND SCREEN
ABO/RH(D): A POS
Antibody Screen: NEGATIVE
UNIT DIVISION: 0
Unit division: 0

## 2017-05-05 LAB — GLUCOSE, CAPILLARY
GLUCOSE-CAPILLARY: 438 mg/dL — AB (ref 65–99)
Glucose-Capillary: 364 mg/dL — ABNORMAL HIGH (ref 65–99)
Glucose-Capillary: 482 mg/dL — ABNORMAL HIGH (ref 65–99)
Glucose-Capillary: 520 mg/dL (ref 65–99)

## 2017-05-05 LAB — PHOSPHORUS
PHOSPHORUS: 4.6 mg/dL (ref 2.5–4.6)
PHOSPHORUS: 3.3 mg/dL (ref 2.5–4.6)
PHOSPHORUS: 3.3 mg/dL (ref 2.5–4.6)

## 2017-05-05 LAB — MAGNESIUM
MAGNESIUM: 2.5 mg/dL — AB (ref 1.7–2.4)
MAGNESIUM: 1.8 mg/dL (ref 1.7–2.4)
Magnesium: 2.2 mg/dL (ref 1.7–2.4)
Magnesium: 1.8 mg/dL (ref 1.7–2.4)

## 2017-05-05 LAB — TROPONIN I
TROPONIN I: 0.23 ng/mL — AB (ref <0.03)
Troponin I: 0.21 ng/mL (ref <0.03)

## 2017-05-05 LAB — PROCALCITONIN
PROCALCITONIN: 0.41 ng/mL
Procalcitonin: 1.23 ng/mL

## 2017-05-05 LAB — CALCIUM, IONIZED: CALCIUM, IONIZED, SERUM: 3.4 mg/dL — AB (ref 4.5–5.6)

## 2017-05-05 MED ORDER — SENNOSIDES 8.8 MG/5ML PO SYRP
5.0000 mL | ORAL_SOLUTION | Freq: Two times a day (BID) | ORAL | Status: DC
  Administered 2017-05-05 (×2): 5 mL
  Filled 2017-05-05 (×3): qty 5

## 2017-05-05 MED ORDER — CALCIUM GLUCONATE 10 % IV SOLN
1.0000 g | Freq: Once | INTRAVENOUS | Status: AC
  Administered 2017-05-05: 1 g via INTRAVENOUS
  Filled 2017-05-05: qty 10

## 2017-05-05 MED ORDER — INSULIN ASPART 100 UNIT/ML ~~LOC~~ SOLN
0.0000 [IU] | SUBCUTANEOUS | Status: DC
  Administered 2017-05-05: 9 [IU] via SUBCUTANEOUS
  Administered 2017-05-05: 3 [IU] via SUBCUTANEOUS
  Administered 2017-05-05: 5 [IU] via SUBCUTANEOUS
  Administered 2017-05-05: 12 [IU] via SUBCUTANEOUS
  Administered 2017-05-06: 1 [IU] via SUBCUTANEOUS
  Administered 2017-05-06: 2 [IU] via SUBCUTANEOUS
  Administered 2017-05-06: 1 [IU] via SUBCUTANEOUS
  Filled 2017-05-05 (×7): qty 1

## 2017-05-05 MED ORDER — LORAZEPAM 2 MG/ML IJ SOLN
1.0000 mg | INTRAMUSCULAR | Status: DC | PRN
Start: 2017-05-05 — End: 2017-05-06
  Administered 2017-05-05 – 2017-05-06 (×2): 2 mg via INTRAVENOUS
  Filled 2017-05-05 (×2): qty 1

## 2017-05-05 MED ORDER — ALBUMIN HUMAN 25 % IV SOLN
50.0000 g | Freq: Four times a day (QID) | INTRAVENOUS | Status: AC
  Administered 2017-05-05 (×3): 50 g via INTRAVENOUS
  Filled 2017-05-05 (×4): qty 200

## 2017-05-05 MED ORDER — SODIUM CHLORIDE 0.9 % IV SOLN
2.0000 g | Freq: Once | INTRAVENOUS | Status: AC
  Administered 2017-05-05: 2 g via INTRAVENOUS
  Filled 2017-05-05: qty 20

## 2017-05-05 MED ORDER — LEVETIRACETAM IN NACL 1000 MG/100ML IV SOLN
1000.0000 mg | Freq: Once | INTRAVENOUS | Status: AC
  Administered 2017-05-05: 1000 mg via INTRAVENOUS
  Filled 2017-05-05: qty 100

## 2017-05-05 MED ORDER — INSULIN ASPART 100 UNIT/ML ~~LOC~~ SOLN
9.0000 [IU] | Freq: Once | SUBCUTANEOUS | Status: AC
  Administered 2017-05-05: 9 [IU] via SUBCUTANEOUS
  Filled 2017-05-05: qty 1

## 2017-05-05 MED ORDER — SODIUM CHLORIDE 0.9 % IV SOLN
INTRAVENOUS | Status: AC
  Administered 2017-05-05: 17:00:00 via INTRAVENOUS

## 2017-05-05 MED ORDER — ORAL CARE MOUTH RINSE
15.0000 mL | Freq: Four times a day (QID) | OROMUCOSAL | Status: DC
  Administered 2017-05-05 – 2017-05-06 (×6): 15 mL via OROMUCOSAL

## 2017-05-05 MED ORDER — HYDROCORTISONE NA SUCCINATE PF 100 MG IJ SOLR
50.0000 mg | Freq: Three times a day (TID) | INTRAMUSCULAR | Status: DC
  Administered 2017-05-05 – 2017-05-06 (×3): 50 mg via INTRAVENOUS
  Filled 2017-05-05 (×3): qty 2

## 2017-05-05 MED ORDER — SODIUM CHLORIDE 0.9 % IV SOLN: INTRAVENOUS | Status: AC

## 2017-05-05 MED ORDER — CHLORHEXIDINE GLUCONATE 0.12% ORAL RINSE (MEDLINE KIT)
15.0000 mL | Freq: Two times a day (BID) | OROMUCOSAL | Status: DC
  Administered 2017-05-05 – 2017-05-06 (×3): 15 mL via OROMUCOSAL

## 2017-05-05 MED ORDER — POTASSIUM CHLORIDE 10 MEQ/50ML IV SOLN
10.0000 meq | INTRAVENOUS | Status: AC
  Administered 2017-05-05 (×3): 10 meq via INTRAVENOUS
  Filled 2017-05-05 (×3): qty 50

## 2017-05-05 NOTE — Progress Notes (Signed)
[facility] Inpatient Progress Note  Subjective: Events noted, hypotension continues, NO significant rebleeding. Patient not candidate for PICC currently.  Objective: Vital signs in last 24 hours: Temp:  [97.4 F (36.3 C)-98 F (36.7 C)] 97.8 F (36.6 C) (05/02 1215) Pulse Rate:  [35-168] 116 (05/02 0730) Resp:  [0-35] 22 (05/02 1215) BP: (40-141)/(10-117) 95/71 (05/02 1215) SpO2:  [75 %-100 %] 99 % (05/02 1155) FiO2 (%):  [80 %-100 %] 90 % (05/02 1155) Weight:  [96.1 kg (211 lb 13.8 oz)-106.6 kg (235 lb 0.2 oz)] 106.6 kg (235 lb 0.2 oz) (05/02 0409) Blood pressure 95/71, pulse (!) 116, temperature 97.8 F (36.6 C), temperature source Axillary, resp. rate (!) 22, height 4' 11"  (1.499 m), weight 106.6 kg (235 lb 0.2 oz), SpO2 99 %.    Intake/Output from previous day: 05/01 0701 - 05/02 0700 In: 9133.5 [I.V.:8095.5; Blood:778; IV Piggyback:260] Out: 638 [Urine:735; Emesis/NG output:200]  Intake/Output this shift: Total I/O In: 150 [I.V.:100; IV Piggyback:50] Out: -      Lab Results: Results for orders placed or performed during the hospital encounter of 05/17/2017 (from the past 24 hour(s))  Lactic acid, plasma     Status: Abnormal   Collection Time: 05/09/2017 12:53 PM  Result Value Ref Range   Lactic Acid, Venous 10.5 (HH) 0.5 - 1.9 mmol/L  Glucose, capillary     Status: None   Collection Time: 05/27/2017  1:59 PM  Result Value Ref Range   Glucose-Capillary 76 65 - 99 mg/dL   Comment 1 Notify RN   MRSA PCR Screening     Status: None   Collection Time: 05/29/2017  2:11 PM  Result Value Ref Range   MRSA by PCR NEGATIVE NEGATIVE  Glucose, capillary     Status: Abnormal   Collection Time: 05/17/2017  3:00 PM  Result Value Ref Range   Glucose-Capillary 55 (L) 65 - 99 mg/dL  Glucose, capillary     Status: Abnormal   Collection Time: 05/30/2017  3:17 PM  Result Value Ref Range   Glucose-Capillary 54 (L) 65 - 99 mg/dL  Hemoglobin and hematocrit, blood      Status: Abnormal   Collection Time: 05/18/2017  4:38 PM  Result Value Ref Range   Hemoglobin 7.3 (L) 12.0 - 16.0 g/dL   HCT 23.5 (L) 35.0 - 47.0 %  ABO/Rh     Status: None   Collection Time: 05/14/2017  4:38 PM  Result Value Ref Range   ABO/RH(D)      A POS Performed at [facility], [address] [place], [place] [address]   Glucose, capillary     Status: Abnormal   Collection Time: 05/11/2017  4:44 PM  Result Value Ref Range   Glucose-Capillary 33 (LL) 65 - 99 mg/dL   Comment 1 Notify RN   Blood gas, arterial     Status: Abnormal   Collection Time: 05/18/2017  5:00 PM  Result Value Ref Range   FIO2 1.00    Mode PRESSURE REGULATED VOLUME CONTROL    VT 500 mL   LHR 15 resp/min   Peep/cpap 5.0 cm H20   pH, Arterial 7.25 (L) 7.350 - 7.450   pCO2 arterial 49 (H) 32.0 - 48.0 mmHg   pO2, Arterial 236 (H) 83.0 - 108.0 mmHg   Bicarbonate 21.5 20.0 - 28.0 mmol/L   Acid-base deficit 6.0 (H) 0.0 - 2.0 mmol/L   O2 Saturation 99.8 %   Patient temperature 37.0    Collection site LEFT BRACHIAL  Sample type ARTERIAL DRAW    Allens test (pass/fail) PASS PASS  Glucose, capillary     Status: Abnormal   Collection Time: 06/02/2017  5:00 PM  Result Value Ref Range   Glucose-Capillary 360 (H) 65 - 99 mg/dL  Comprehensive metabolic panel     Status: Abnormal   Collection Time: 05/15/2017  5:28 PM  Result Value Ref Range   Sodium 132 (L) 135 - 145 mmol/L   Potassium 4.2 3.5 - 5.1 mmol/L   Chloride 91 (L) 101 - 111 mmol/L   CO2 22 22 - 32 mmol/L   Glucose, Bld 795 (HH) 65 - 99 mg/dL   BUN 17 6 - 20 mg/dL   Creatinine, Ser 1.15 (H) 0.44 - 1.00 mg/dL   Calcium 6.2 (LL) 8.9 - 10.3 mg/dL   Total Protein 3.4 (L) 6.5 - 8.1 g/dL   Albumin 1.3 (L) 3.5 - 5.0 g/dL   AST 4,879 (H) 15 - 41 U/L   ALT 1,958 (H) 14 - 54 U/L   Alkaline Phosphatase 85 38 - 126 U/L   Total Bilirubin 0.6 0.3 - 1.2 mg/dL   GFR calc non Af Amer 43 (L) >60 mL/min   GFR calc Af Amer 50 (L) >60 mL/min   Anion gap 19 (H)  5 - 15  Magnesium     Status: Abnormal   Collection Time: 05/14/2017  5:28 PM  Result Value Ref Range   Magnesium 1.6 (L) 1.7 - 2.4 mg/dL  Phosphorus     Status: Abnormal   Collection Time: 05/27/2017  5:28 PM  Result Value Ref Range   Phosphorus 4.9 (H) 2.5 - 4.6 mg/dL  Calcium, ionized     Status: Abnormal   Collection Time: 05/27/2017  5:28 PM  Result Value Ref Range   Calcium, Ionized, Serum 3.4 (L) 4.5 - 5.6 mg/dL  Prepare RBC     Status: None   Collection Time: 05/11/2017  5:30 PM  Result Value Ref Range   Order Confirmation      ORDER PROCESSED BY BLOOD BANK Performed at [facility], [address] [place], [place] [address]   Glucose, capillary     Status: Abnormal   Collection Time: 05/21/2017  5:53 PM  Result Value Ref Range   Glucose-Capillary 590 (HH) 65 - 99 mg/dL   Comment 1 Notify RN   Glucose, capillary     Status: Abnormal   Collection Time: 05/30/2017  7:14 PM  Result Value Ref Range   Glucose-Capillary 496 (H) 65 - 99 mg/dL  Glucose, capillary     Status: Abnormal   Collection Time: 05/09/2017  9:46 PM  Result Value Ref Range   Glucose-Capillary >600 (HH) 65 - 99 mg/dL  Hemoglobin and hematocrit, blood     Status: Abnormal   Collection Time: 06/03/2017  9:48 PM  Result Value Ref Range   Hemoglobin 10.5 (L) 12.0 - 16.0 g/dL   HCT 34.5 (L) 35.0 - 64.3 %  Basic metabolic panel     Status: Abnormal   Collection Time: 05/19/2017  9:48 PM  Result Value Ref Range   Sodium 132 (L) 135 - 145 mmol/L   Potassium 4.2 3.5 - 5.1 mmol/L   Chloride 92 (L) 101 - 111 mmol/L   CO2 22 22 - 32 mmol/L   Glucose, Bld 804 (HH) 65 - 99 mg/dL   BUN 18 6 - 20 mg/dL   Creatinine, Ser 1.17 (H) 0.44 - 1.00 mg/dL   Calcium 6.3 (LL) 8.9 - 10.3  mg/dL   GFR calc non Af Amer 43 (L) >60 mL/min   GFR calc Af Amer 49 (L) >60 mL/min   Anion gap 18 (H) 5 - 15  Magnesium     Status: None   Collection Time: 05/13/2017  9:48 PM  Result Value Ref Range   Magnesium 1.7 1.7 - 2.4 mg/dL   Phosphorus     Status: Abnormal   Collection Time: 05/08/2017  9:48 PM  Result Value Ref Range   Phosphorus 5.0 (H) 2.5 - 4.6 mg/dL  Procalcitonin - Baseline     Status: None   Collection Time: 05/12/2017  9:48 PM  Result Value Ref Range   Procalcitonin 0.41 ng/mL  Glucose, capillary     Status: Abnormal   Collection Time: 05/08/2017 11:58 PM  Result Value Ref Range   Glucose-Capillary 520 (HH) 65 - 99 mg/dL  Comprehensive metabolic panel     Status: Abnormal   Collection Time: 05/05/17  1:21 AM  Result Value Ref Range   Sodium 132 (L) 135 - 145 mmol/L   Potassium 4.0 3.5 - 5.1 mmol/L   Chloride 93 (L) 101 - 111 mmol/L   CO2 23 22 - 32 mmol/L   Glucose, Bld 619 (HH) 65 - 99 mg/dL   BUN 17 6 - 20 mg/dL   Creatinine, Ser 1.14 (H) 0.44 - 1.00 mg/dL   Calcium 6.3 (LL) 8.9 - 10.3 mg/dL   Total Protein 3.8 (L) 6.5 - 8.1 g/dL   Albumin 1.5 (L) 3.5 - 5.0 g/dL   AST 6,630 (H) 15 - 41 U/L   ALT 2,380 (H) 14 - 54 U/L   Alkaline Phosphatase 96 38 - 126 U/L   Total Bilirubin 0.9 0.3 - 1.2 mg/dL   GFR calc non Af Amer 44 (L) >60 mL/min   GFR calc Af Amer 51 (L) >60 mL/min   Anion gap 16 (H) 5 - 15  Procalcitonin     Status: None   Collection Time: 05/05/17  1:21 AM  Result Value Ref Range   Procalcitonin 1.23 ng/mL  Troponin I     Status: Abnormal   Collection Time: 05/05/17  1:21 AM  Result Value Ref Range   Troponin I 0.21 (HH) <0.03 ng/mL  Magnesium     Status: Abnormal   Collection Time: 05/05/17  1:21 AM  Result Value Ref Range   Magnesium 2.5 (H) 1.7 - 2.4 mg/dL  Glucose, capillary     Status: Abnormal   Collection Time: 05/05/17  4:15 AM  Result Value Ref Range   Glucose-Capillary 482 (H) 65 - 99 mg/dL  Hemoglobin and hematocrit, blood     Status: None   Collection Time: 05/05/17  5:38 AM  Result Value Ref Range   Hemoglobin 13.1 12.0 - 16.0 g/dL   HCT 41.6 35.0 - 22.9 %  Basic metabolic panel     Status: Abnormal   Collection Time: 05/05/17  5:38 AM  Result Value Ref  Range   Sodium 131 (L) 135 - 145 mmol/L   Potassium 4.2 3.5 - 5.1 mmol/L   Chloride 93 (L) 101 - 111 mmol/L   CO2 24 22 - 32 mmol/L   Glucose, Bld 538 (HH) 65 - 99 mg/dL   BUN 17 6 - 20 mg/dL   Creatinine, Ser 1.32 (H) 0.44 - 1.00 mg/dL   Calcium 6.2 (LL) 8.9 - 10.3 mg/dL   GFR calc non Af Amer 37 (L) >60 mL/min   GFR calc Af Amer 43 (L) >60  mL/min   Anion gap 14 5 - 15  Magnesium     Status: None   Collection Time: 05/05/17  5:38 AM  Result Value Ref Range   Magnesium 2.2 1.7 - 2.4 mg/dL  Phosphorus     Status: None   Collection Time: 05/05/17  5:38 AM  Result Value Ref Range   Phosphorus 4.6 2.5 - 4.6 mg/dL  Glucose, capillary     Status: Abnormal   Collection Time: 05/05/17  7:47 AM  Result Value Ref Range   Glucose-Capillary 438 (H) 65 - 99 mg/dL  Lactic acid, plasma     Status: Abnormal   Collection Time: 05/05/17  9:07 AM  Result Value Ref Range   Lactic Acid, Venous 8.2 (HH) 0.5 - 1.9 mmol/L  Blood gas, venous     Status: None   Collection Time: 05/05/17  9:08 AM  Result Value Ref Range   pH, Ven 7.25 7.250 - 7.430   pCO2, Ven 57 44.0 - 60.0 mmHg   pO2, Ven 35.0 32.0 - 45.0 mmHg   Patient temperature 37.0    Collection site VENOUS    Sample type VENOUS   Hemoglobin and hematocrit, blood     Status: None   Collection Time: 05/05/17  9:47 AM  Result Value Ref Range   Hemoglobin 12.7 12.0 - 16.0 g/dL   HCT 38.7 35.0 - 47.0 %  Troponin I     Status: Abnormal   Collection Time: 05/05/17  9:47 AM  Result Value Ref Range   Troponin I 0.23 (HH) <0.03 ng/mL     Recent Labs    05/02/17 1354 05/02/17 1832 05/20/2017 0641  05/06/2017 2148 05/05/17 0538 05/05/17 0947  WBC 14.4* 12.6* 18.4*  --   --   --   --   HGB 11.4* 12.1 10.6*   < > 10.5* 13.1 12.7  HCT 35.0 36.5 31.8*   < > 34.5* 41.6 38.7  PLT 297 334 353  --   --   --   --    < > = values in this interval not displayed.   BMET Recent Labs    05/12/2017 2148 05/05/17 0121 05/05/17 0538  NA 132* 132* 131*   K 4.2 4.0 4.2  CL 92* 93* 93*  CO2 22 23 24   GLUCOSE 804* 619* 538*  BUN 18 17 17   CREATININE 1.17* 1.14* 1.32*  CALCIUM 6.3* 6.3* 6.2*   LFT Recent Labs    05/05/17 0121  PROT 3.8*  ALBUMIN 1.5*  AST 6,630*  ALT 2,380*  ALKPHOS 96  BILITOT 0.9   PT/INR Recent Labs    05/05/2017 0738  LABPROT 15.6*  INR 1.25   Hepatitis Panel No results for input(s): HEPBSAG, HCVAB, HEPAIGM, HEPBIGM in the last 72 hours. C-Diff No results for input(s): CDIFFTOX in the last 72 hours. No results for input(s): CDIFFPCR in the last 72 hours.   Studies/Results: Dg Abd 1 View  Result Date: 05/31/2017 CLINICAL DATA:  Nasogastric tube placement and intubation. History of breast cancer, colon cancer, congestive heart failure and diabetes. EXAM: ABDOMEN - 1 VIEW COMPARISON:  CT earlier the same date.  Radiographs 02/16/2017. FINDINGS: 1545 hours. Nasogastric tube projects to the distal stomach. There is prominent stool throughout the colon which is mildly dilated diffusely. Probable rectal prolapse and gastric bezoar as correlated with previous CT. Vascular calcifications and mild spondylosis noted. IMPRESSION: Nasogastric tube extends into the distal stomach. Prominent stool throughout the colon, similar to previous CT. Electronically Signed  By: Richardean Sale M.D.   On: 05/09/2017 16:29   Ct Chest W Contrast  Result Date: 05/21/2017 CLINICAL DATA:  Hemoptysis versus hematemesis. Abdominal pain, cough. EXAM: CT CHEST, ABDOMEN, AND PELVIS WITH CONTRAST TECHNIQUE: Multidetector CT imaging of the chest, abdomen and pelvis was performed following the standard protocol during bolus administration of intravenous contrast. CONTRAST:  65m ISOVUE-370 IOPAMIDOL (ISOVUE-370) INJECTION 76% COMPARISON:  Chest CT 03/02/2017.  Abdominal CT 05/17/2007. FINDINGS: CT CHEST FINDINGS Cardiovascular: Moderate coronary artery calcifications in the aortic arch and descending thoracic aorta. Moderate scattered coronary  artery calcifications. Pacer wires noted in the right atrium and right ventricle. Heart is normal size. Aorta is normal caliber. No evidence of aortic dissection. Mediastinum/Nodes: No mediastinal, hilar, or axillary adenopathy. The esophagus is fluid-filled. Lungs/Pleura: Small bilateral pleural effusions. Compressive atelectasis in the lower lobes. Musculoskeletal: No acute bony abnormality. CT ABDOMEN PELVIS FINDINGS Hepatobiliary: Fatty infiltration of the liver. No focal abnormality or biliary ductal dilatation. Gallbladder unremarkable. Pancreas: No focal abnormality or ductal dilatation. Mildly atrophic. Spleen: No focal abnormality.  Normal size. Adrenals/Urinary Tract: Areas of cortical thinning bilaterally. No hydronephrosis. Cysts noted in the mid to lower pole of the left kidney. No adrenal mass or visible urinary bladder abnormality. Stomach/Bowel: Large stool burden throughout the colon. Scattered sigmoid diverticula. Large masslike filling defect noted in the stomach which appears to be separate from the gastric wall. This contains gas within this large intraluminal mass. Fluid and gas surrounding mass. I favor this represents a bezoar. No visible gastric wall abnormality or gastric outlet obstruction. Small bowel is decompressed. Vascular/Lymphatic: Heavily calcified aorta and branch vessels. No aneurysm or adenopathy. Reproductive: Probable anterior uterine fibroid, best seen on sagittal imaging, measuring up to 3.2 cm. No adnexal mass. Other: No free fluid or free air. Musculoskeletal: No acute bony abnormality. IMPRESSION: Small bilateral pleural effusions with compressive atelectasis in the lower lobes. Coronary artery disease, aortic atherosclerosis. Large intraluminal filling defect within the stomach, question bezoar. No visible gastric wall abnormality. This could be further evaluated with direct visualization/endoscopy. Esophagus is fluid-filled possibly related to reflux or dysmotility.  Large stool burden throughout the colon. Fatty infiltration of the liver. Aortoiliac atherosclerosis. Electronically Signed   By: KRolm BaptiseM.D.   On: 05/21/2017 09:52   Ct Abdomen Pelvis W Contrast  Result Date: 05/18/2017 CLINICAL DATA:  Hemoptysis versus hematemesis. Abdominal pain, cough. EXAM: CT CHEST, ABDOMEN, AND PELVIS WITH CONTRAST TECHNIQUE: Multidetector CT imaging of the chest, abdomen and pelvis was performed following the standard protocol during bolus administration of intravenous contrast. CONTRAST:  740mISOVUE-370 IOPAMIDOL (ISOVUE-370) INJECTION 76% COMPARISON:  Chest CT 03/02/2017.  Abdominal CT 05/17/2007. FINDINGS: CT CHEST FINDINGS Cardiovascular: Moderate coronary artery calcifications in the aortic arch and descending thoracic aorta. Moderate scattered coronary artery calcifications. Pacer wires noted in the right atrium and right ventricle. Heart is normal size. Aorta is normal caliber. No evidence of aortic dissection. Mediastinum/Nodes: No mediastinal, hilar, or axillary adenopathy. The esophagus is fluid-filled. Lungs/Pleura: Small bilateral pleural effusions. Compressive atelectasis in the lower lobes. Musculoskeletal: No acute bony abnormality. CT ABDOMEN PELVIS FINDINGS Hepatobiliary: Fatty infiltration of the liver. No focal abnormality or biliary ductal dilatation. Gallbladder unremarkable. Pancreas: No focal abnormality or ductal dilatation. Mildly atrophic. Spleen: No focal abnormality.  Normal size. Adrenals/Urinary Tract: Areas of cortical thinning bilaterally. No hydronephrosis. Cysts noted in the mid to lower pole of the left kidney. No adrenal mass or visible urinary bladder abnormality. Stomach/Bowel: Large stool burden throughout the colon. Scattered  sigmoid diverticula. Large masslike filling defect noted in the stomach which appears to be separate from the gastric wall. This contains gas within this large intraluminal mass. Fluid and gas surrounding mass. I favor  this represents a bezoar. No visible gastric wall abnormality or gastric outlet obstruction. Small bowel is decompressed. Vascular/Lymphatic: Heavily calcified aorta and branch vessels. No aneurysm or adenopathy. Reproductive: Probable anterior uterine fibroid, best seen on sagittal imaging, measuring up to 3.2 cm. No adnexal mass. Other: No free fluid or free air. Musculoskeletal: No acute bony abnormality. IMPRESSION: Small bilateral pleural effusions with compressive atelectasis in the lower lobes. Coronary artery disease, aortic atherosclerosis. Large intraluminal filling defect within the stomach, question bezoar. No visible gastric wall abnormality. This could be further evaluated with direct visualization/endoscopy. Esophagus is fluid-filled possibly related to reflux or dysmotility. Large stool burden throughout the colon. Fatty infiltration of the liver. Aortoiliac atherosclerosis. Electronically Signed   By: Rolm Baptise M.D.   On: 05/17/2017 09:52   Dg Chest Port 1 View  Result Date: 05/05/2017 CLINICAL DATA:  CHF EXAM: PORTABLE CHEST 1 VIEW COMPARISON:  05/09/2017 FINDINGS: Support devices are stable. Cardiomegaly with vascular congestion, layering effusions and bilateral airspace opacities. Layering effusions appear more prominent which may be positional. IMPRESSION: Continued CHF with layering bilateral effusions. The effusions appear larger. However, this may be positional. Electronically Signed   By: Rolm Baptise M.D.   On: 05/05/2017 09:48   Dg Chest Port 1 View  Result Date: 05/19/2017 CLINICAL DATA:  Nasogastric tube placement and intubation. History of breast cancer, colon cancer, congestive heart failure and diabetes. EXAM: PORTABLE CHEST 1 VIEW COMPARISON:  CT and radiographs earlier today. Radiographs 05/02/2017. FINDINGS: 1545 hours. Mild patient rotation to the right. Endotracheal tube has been placed, terminating at the level of the mid trachea. There is a right IJ central venous  catheter, the tip of which is not confidently identified due to overlap with the left subclavian pacemaker leads. The pacemaker leads appear unchanged. The heart size and mediastinal contours are stable allowing for patient rotation. There are persistent bilateral pleural effusions and bibasilar atelectasis. No evidence of pneumothorax. IMPRESSION: Satisfactory position of the endotracheal tube. Right IJ central venous catheter tip not well visualized. No evidence of pneumothorax or significant change in the pleural effusions. Electronically Signed   By: Richardean Sale M.D.   On: 05/20/2017 16:24   Dg Chest Portable 1 View  Result Date: 05/14/2017 CLINICAL DATA:  Abdominal pain, cough, and bloody sputum. EXAM: PORTABLE CHEST 1 VIEW COMPARISON:  05/02/2017 FINDINGS: Cardiac pacemaker. Shallow inspiration with atelectasis in the lung bases. Cardiac enlargement. No vascular congestion, edema, or consolidation. Hazy appearance of the left costophrenic angle may represent small effusion. Calcified and tortuous aorta. No pneumothorax. IMPRESSION: Shallow inspiration with atelectasis in the lung bases. Possible left pleural effusion. Cardiac enlargement. No edema or consolidation. Aortic atherosclerosis. Electronically Signed   By: Lucienne Capers M.D.   On: 05/14/2017 06:45   [place] Ekg Site Rite  Result Date: 05/29/2017 If Site Rite image not attached, placement could not be confirmed due to current cardiac rhythm.  [place] Ekg Site Rite  Result Date: 05/22/2017 If Site Rite image not attached, placement could not be confirmed due to current cardiac rhythm.   Scheduled Inpatient Medications:   . budesonide  0.5 mg Nebulization BID  . chlorhexidine gluconate (MEDLINE KIT)  15 mL Mouth Rinse BID  . dextrose  100 mL Intravenous Once  . dextrose  3 ampule Intravenous Once  .  dextrose  1 ampule Intravenous Once  . dextrose  1 ampule Intravenous Once  . dextrose  1 ampule Intravenous Once  . dextrose  1 ampule  Intravenous Once  . divalproex  250 mg Oral BID  . etomidate  20 mg Intravenous Once  . feeding supplement (PRO-STAT SUGAR FREE 64)  30 mL Oral BID BM  . gabapentin  400 mg Oral TID  . hydrocortisone sod succinate (SOLU-CORTEF) inj  50 mg Intravenous Q8H  . insulin aspart  0-9 Units Subcutaneous Q4H  . ipratropium-albuterol  3 mL Nebulization QID  . ketotifen  1 drop Both Eyes BID  . magnesium oxide  400 mg Oral BID  . mouth rinse  15 mL Mouth Rinse QID  . nystatin   Topical Daily  . pantoprazole (PROTONIX) IV  40 mg Intravenous Once  . sennosides  5 mL Per Tube BID  . sodium bicarbonate  100 mEq Intravenous Once  . sodium bicarbonate  100 mEq Intravenous Once  . sodium bicarbonate  50 mEq Intravenous Once  . sodium bicarbonate  50 mEq Intravenous Once  . sucralfate  1 g Oral TID AC & HS    Continuous Inpatient Infusions:   . sodium chloride 100 mL/hr at 05/05/17 0700  . albumin human    . amiodarone 30 mg/hr (05/05/17 0951)  . dextrose Stopped (05/30/2017 2151)  . epinephrine Stopped (05/09/2017 1815)  . fentaNYL 0 mcg/hr (05/05/17 1134)  . norepinephrine (LEVOPHED) Adult infusion 35 mcg/min (05/05/17 1230)  . pantoprozole (PROTONIX) infusion 8 mg/hr (05/05/17 0700)  . phenylephrine (NEO-SYNEPHRINE) Adult infusion 400 mcg/min (05/05/17 1212)  . piperacillin-tazobactam (ZOSYN)  IV Stopped (05/05/17 1144)  .  sodium bicarbonate (isotonic) infusion in sterile water 75 mL/hr at 05/05/17 0830  . vancomycin    . vasopressin (PITRESSIN) infusion - *FOR SHOCK* 0.03 Units/min (05/05/17 0700)    PRN Inpatient Medications:  acetaminophen **OR** acetaminophen, albuterol, guaiFENesin-dextromethorphan, LORazepam, menthol-cetylpyridinium, ondansetron **OR** ondansetron (ZOFRAN) IV, polyethylene glycol, sodium chloride flush  Miscellaneous: N/A  Assessment:  1. Gastrointestinal bleeding - Reviewed previous EGD 2018 by Dr. Vira Agar - No masses noted in stomach. 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 Continue to follow H/H.   Plan:  1. Continue PPI. 2. I would not pursue elective endoscopy until patient off pressors. 3. Following along.  Alexus Galka K. Alice Reichert, M.D. 05/05/2017, 12:45 PM

## 2017-05-05 NOTE — Progress Notes (Signed)
[facility] NAME: Toneshia Coello    MR#:  [id number]  DATE OF BIRTH:  [birth year]  SUBJECTIVE:  CHIEF COMPLAINT:   Chief Complaint  Patient presents with  . Hemoptysis  . Abdominal Pain   Critically sick, sedated on vent REVIEW OF SYSTEMS:  Review of Systems  Unable to perform ROS: Intubated    DRUG ALLERGIES:  No Known Allergies VITALS:  Blood pressure (!) 96/57, pulse (!) 118, temperature 98.2 F (36.8 C), temperature source Axillary, resp. rate (!) 22, height 4\' 11"  (1.499 m), weight 106.6 kg (235 lb 0.2 oz), SpO2 99 %. PHYSICAL EXAMINATION:  Physical Exam  HENT:  Head: Normocephalic and atraumatic.  ETT in place  Eyes: Pupils are equal, round, and reactive to light. Conjunctivae and EOM are normal.  Neck: Normal range of motion. Neck supple. No tracheal deviation present. No thyromegaly present.  Cardiovascular: Normal rate, regular rhythm and normal heart sounds.  Pulmonary/Chest: Effort normal and breath sounds normal. No respiratory distress. She has no wheezes. She exhibits no tenderness.  Abdominal: Soft. Bowel sounds are normal. She exhibits no distension. There is no tenderness.  Musculoskeletal: Normal range of motion.  Neurological: No cranial nerve deficit.  Unable to assess - sedated on vent  Skin: Skin is warm and dry. No rash noted.  Psychiatric:  Unable to assess - sedated on vent   LABORATORY PANEL:  Female CBC Recent Labs  Lab 06/01/2017 0641  05/05/17 1614  WBC 18.4*  --   --   HGB 10.6*   < > 10.7*  HCT 31.8*   < > 32.1*  PLT 353  --   --    < > = values in this interval not displayed.   ------------------------------------------------------------------------------------------------------------------ Chemistries  Recent Labs  Lab 05/05/17 0121  05/05/17 1353  NA 132*   < > 133*  K 4.0   < > 3.3*  CL 93*   < > 96*  CO2 23   < > 26  GLUCOSE 619*   < > 389*  BUN 17   < > 15  CREATININE  1.14*   < > 1.13*  CALCIUM 6.3*   < > 6.0*  MG 2.5*   < > 1.8  AST 6,630*  --   --   ALT 2,380*  --   --   ALKPHOS 96  --   --   BILITOT 0.9  --   --    < > = values in this interval not displayed.   RADIOLOGY:  Dg Chest Port 1 View  Result Date: 05/05/2017 CLINICAL DATA:  CHF EXAM: PORTABLE CHEST 1 VIEW COMPARISON:  05/05/2017 FINDINGS: Support devices are stable. Cardiomegaly with vascular congestion, layering effusions and bilateral airspace opacities. Layering effusions appear more prominent which may be positional. IMPRESSION: Continued CHF with layering bilateral effusions. The effusions appear larger. However, this may be positional. Electronically Signed   By: Rolm Baptise M.D.   On: 05/05/2017 09:48   ASSESSMENT AND PLAN:   *Acute respiratory failure. - sedated on vent, vent mgmt per PCCM  * Acute metabolic encephalopathy  * Severe Sepsis POA. Etiology unclear. Leucocytosis, tachycardia, lactic acidosis present on admission - continue broad spectrum abx. Vanc and zosyn Cx sent and pending IVF resuscitation. 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 Nothing acute on CT chest. Patient sees pulmonary as OP.   *  Chronic diastolic CHF.  Monitor for any fluid overload.  *Diabetes mellitus.  Every 4 Accu-Cheks with sliding scale insulin.  DVT prophylaxis with SCDs       All the records are reviewed and case discussed with Care Management/Social Worker. Management plans discussed with the patient, family and they are in agreement.  CODE STATUS: Full Code  TOTAL TIME TAKING CARE OF THIS PATIENT: 10 minutes.   More than 50% of the time was spent in counseling/coordination of care: YES  POSSIBLE D/C IN 5-6 DAYS, DEPENDING ON CLINICAL CONDITION.   Max Sane M.D on 05/05/2017 at 8:28 PM  Between 7am to 6pm - Pager - [phone]  After 6pm go to [email or web address] - Proofreader  [facility]  Office  [phone]  CC: Primary care physician; Juluis Pitch, MD  Note: This dictation was prepared with Dragon dictation along with smaller phrase technology. Any transcriptional errors that result from this process are unintentional.

## 2017-05-05 NOTE — Progress Notes (Signed)
Inpatient Diabetes Program Recommendations  AACE/ADA: New Consensus Statement on Inpatient Glycemic Control (2015)  Target Ranges:  Prepandial:   less than 140 mg/dL      Peak postprandial:   less than 180 mg/dL (1-2 hours)      Critically ill patients:  140 - 180 mg/dL   Lab Results  Component Value Date   GLUCAP 364 (H) 05/05/2017   HGBA1C 5.8 (H) 05/20/2017    Review of Glycemic Control Results for GERALD, HONEA (MRN [id number]) as of 05/05/2017 13:58  Ref. Range 06/03/2017 21:46 05/27/2017 23:58 05/05/2017 04:15 05/05/2017 07:47 05/05/2017 11:53  Glucose-Capillary Latest Ref Range: 65 - 99 mg/dL >600 (HH) 520 (HH) 482 (H) 438 (H) 364 (H)   Diabetes history: DM2 Outpatient Diabetes medications: Novolog correction scale Current orders for Inpatient glycemic control: Novolog sensitive correction scale  Inpatient Diabetes Program Recommendations:   -Noted hyperglycemia. -Adult phase 2 ICU protocol  Thank you, Bethena Roys E. Clinton Wahlberg, RN, MSN, CDE  Diabetes Coordinator Inpatient Glycemic Control Team Team Pager [phone] (8am-5pm) 05/05/2017 2:01 PM

## 2017-05-05 NOTE — Clinical Social Work Note (Signed)
CSW notified that patient is from [facility] and that she can return at discharge. Full assessment to follow. Shela Leff MSW,LCSW [phone]

## 2017-05-05 NOTE — Progress Notes (Signed)
Notified by RN pt having possible seizure activity, therefore assessed pt she is currently having generalized twitching prn ativan and scheduled keppra ordered. Will continue to monitor and assess pt.  Marda Stalker, [place] Pager [phone] (please enter 7 digits) PCCM Consult Pager [phone] (please enter 7 digits)

## 2017-05-05 NOTE — Progress Notes (Signed)
[facility], [place] 05/05/17  Subjective:   Patient known to our practice from outpatient follow-up She is admitted because of ongoing abdominal pain for about a month.  She had a CT of the abdomen that showed a possible mass.  Patient also had cough  Blood was aspirated with NG tube Patient is currently intubated and on multiple pressors Her blood pressure has been in the 03P to 60 systolic range yesterday Currently she is requiring FiO2 of 90% She is tachycardic and remains hypotensive Serum creatinine is 1.13 but urine output is decreasing therefore nephrology consult has been requested  Objective:  Vital signs in last 24 hours:  Temp:  [97.4 F (36.3 C)-98 F (36.7 C)] 98 F (36.7 C) (05/02 0800) Pulse Rate:  [35-168] 116 (05/02 0730) Resp:  [0-35] 22 (05/02 1045) BP: (40-141)/(10-117) 102/68 (05/02 1045) SpO2:  [75 %-100 %] 96 % (05/02 0730) FiO2 (%):  [80 %-100 %] 90 % (05/02 0725) Weight:  [211 lb 13.8 oz (96.1 kg)-235 lb 0.2 oz (106.6 kg)] 235 lb 0.2 oz (106.6 kg) (05/02 0409)  Weight change: -19 lb 2.2 oz (-8.681 kg) Filed Weights   06/02/2017 0631 06/01/2017 1359 05/05/17 0409  Weight: 231 lb (104.8 kg) 211 lb 13.8 oz (96.1 kg) 235 lb 0.2 oz (106.6 kg)    Intake/Output:    Intake/Output Summary (Last 24 hours) at 05/05/2017 1120 Last data filed at 05/05/2017 1046 Gross per 24 hour  Intake 9283.54 ml  Output 935 ml  Net 8348.54 ml     Physical Exam: General:  Critically ill-appearing, morbidly obese  HEENT  ET tube, OG tube      Pulm/lungs  vent assisted  CVS/Heart Tachycardic, irregular  Abdomen:  obese  Extremities: + edema  Neurologic: sedated  Skin: Extremities are Cool to touch          Basic Metabolic Panel:  Recent Labs  Lab 05/16/2017 0641 05/16/2017 1728 05/25/2017 2148 05/05/17 0121 05/05/17 0538  NA 135 132* 132* 132* 131*  K 5.1 4.2 4.2 4.0 4.2  CL 95* 91* 92* 93* 93*  CO2 _0 GLUCOSE 136* 795*  804* 619* 538*  BUN _1 CREATININE 0.62 1.15* 1.17* 1.14* 1.32*  CALCIUM 7.1* 6.2* 6.3* 6.3* 6.2*  MG  --  1.6* 1.7 2.5* 2.2  PHOS  --  4.9* 5.0*  --  4.6     CBC: Recent Labs  Lab 05/02/17 1354 05/02/17 1832 06/01/2017 0641 05/05/2017 1638 05/16/2017 2148 05/05/17 0538 05/05/17 0947  WBC 14.4* 12.6* 18.4*  --   --   --   --   NEUTROABS 13.3* 11.3* 14.7*  --   --   --   --   HGB 11.4* 12.1 10.6* 7.3* 10.5* 13.1 12.7  HCT 35.0 36.5 31.8* 23.5* 34.5* 41.6 38.7  MCV 89.8 89.8 91.2  --   --   --   --   PLT 297 334 353  --   --   --   --      No results found for: HEPBSAG, HEPBSAB, HEPBIGM    Microbiology:  Recent Results (from the past 240 hour(s))  Blood culture (routine x 2)     Status: None (Preliminary result)   Collection Time: 05/16/2017  7:39 AM  Result Value Ref Range Status   Specimen Description BLOOD RIGHT [facility]  Final   Special Requests   Final    BOTTLES DRAWN AEROBIC AND ANAEROBIC  Blood Culture adequate volume   Culture   Final    NO GROWTH < 24 HOURS Performed at [facility], [address] [place], [place] [address]    Report Status PENDING  Incomplete  MRSA PCR Screening     Status: None   Collection Time: 05/17/2017  2:11 PM  Result Value Ref Range Status   MRSA by PCR NEGATIVE NEGATIVE Final    Comment:        The GeneXpert MRSA Assay (FDA approved for NASAL specimens only), is one component of a comprehensive MRSA colonization surveillance program. It is not intended to diagnose MRSA infection nor to guide or monitor treatment for MRSA infections. Performed at [facility], [address] [place], [place] [address]     Coagulation Studies: Recent Labs    06/01/2017 6203  LABPROT 15.6*  INR 1.25    Urinalysis: Recent Labs    05/29/2017 1025  COLORURINE AMBER*  LABSPEC 1.030  PHURINE 5.0  GLUCOSEU NEGATIVE  HGBUR NEGATIVE  BILIRUBINUR NEGATIVE  KETONESUR NEGATIVE  PROTEINUR 30*  NITRITE NEGATIVE   LEUKOCYTESUR LARGE*      Imaging: Dg Abd 1 View  Result Date: 05/21/2017 CLINICAL DATA:  Nasogastric tube placement and intubation. History of breast cancer, colon cancer, congestive heart failure and diabetes. EXAM: ABDOMEN - 1 VIEW COMPARISON:  CT earlier the same date.  Radiographs 02/16/2017. FINDINGS: 1545 hours. Nasogastric tube projects to the distal stomach. There is prominent stool throughout the colon which is mildly dilated diffusely. Probable rectal prolapse and gastric bezoar as correlated with previous CT. Vascular calcifications and mild spondylosis noted. IMPRESSION: Nasogastric tube extends into the distal stomach. Prominent stool throughout the colon, similar to previous CT. Electronically Signed   By: Richardean Sale M.D.   On: 05/08/2017 16:29   Ct Chest W Contrast  Result Date: 05/16/2017 CLINICAL DATA:  Hemoptysis versus hematemesis. Abdominal pain, cough. EXAM: CT CHEST, ABDOMEN, AND PELVIS WITH CONTRAST TECHNIQUE: Multidetector CT imaging of the chest, abdomen and pelvis was performed following the standard protocol during bolus administration of intravenous contrast. CONTRAST:  77m ISOVUE-370 IOPAMIDOL (ISOVUE-370) INJECTION 76% COMPARISON:  Chest CT 03/02/2017.  Abdominal CT 05/17/2007. FINDINGS: CT CHEST FINDINGS Cardiovascular: Moderate coronary artery calcifications in the aortic arch and descending thoracic aorta. Moderate scattered coronary artery calcifications. Pacer wires noted in the right atrium and right ventricle. Heart is normal size. Aorta is normal caliber. No evidence of aortic dissection. Mediastinum/Nodes: No mediastinal, hilar, or axillary adenopathy. The esophagus is fluid-filled. Lungs/Pleura: Small bilateral pleural effusions. Compressive atelectasis in the lower lobes. Musculoskeletal: No acute bony abnormality. CT ABDOMEN PELVIS FINDINGS Hepatobiliary: Fatty infiltration of the liver. No focal abnormality or biliary ductal dilatation. Gallbladder  unremarkable. Pancreas: No focal abnormality or ductal dilatation. Mildly atrophic. Spleen: No focal abnormality.  Normal size. Adrenals/Urinary Tract: Areas of cortical thinning bilaterally. No hydronephrosis. Cysts noted in the mid to lower pole of the left kidney. No adrenal mass or visible urinary bladder abnormality. Stomach/Bowel: Large stool burden throughout the colon. Scattered sigmoid diverticula. Large masslike filling defect noted in the stomach which appears to be separate from the gastric wall. This contains gas within this large intraluminal mass. Fluid and gas surrounding mass. I favor this represents a bezoar. No visible gastric wall abnormality or gastric outlet obstruction. Small bowel is decompressed. Vascular/Lymphatic: Heavily calcified aorta and branch vessels. No aneurysm or adenopathy. Reproductive: Probable anterior uterine fibroid, best seen on sagittal imaging, measuring up to 3.2 cm. No adnexal mass.  Other: No free fluid or free air. Musculoskeletal: No acute bony abnormality. IMPRESSION: Small bilateral pleural effusions with compressive atelectasis in the lower lobes. Coronary artery disease, aortic atherosclerosis. Large intraluminal filling defect within the stomach, question bezoar. No visible gastric wall abnormality. This could be further evaluated with direct visualization/endoscopy. Esophagus is fluid-filled possibly related to reflux or dysmotility. Large stool burden throughout the colon. Fatty infiltration of the liver. Aortoiliac atherosclerosis. Electronically Signed   By: Rolm Baptise M.D.   On: 05/05/2017 09:52   Ct Abdomen Pelvis W Contrast  Result Date: 05/13/2017 CLINICAL DATA:  Hemoptysis versus hematemesis. Abdominal pain, cough. EXAM: CT CHEST, ABDOMEN, AND PELVIS WITH CONTRAST TECHNIQUE: Multidetector CT imaging of the chest, abdomen and pelvis was performed following the standard protocol during bolus administration of intravenous contrast. CONTRAST:  23m  ISOVUE-370 IOPAMIDOL (ISOVUE-370) INJECTION 76% COMPARISON:  Chest CT 03/02/2017.  Abdominal CT 05/17/2007. FINDINGS: CT CHEST FINDINGS Cardiovascular: Moderate coronary artery calcifications in the aortic arch and descending thoracic aorta. Moderate scattered coronary artery calcifications. Pacer wires noted in the right atrium and right ventricle. Heart is normal size. Aorta is normal caliber. No evidence of aortic dissection. Mediastinum/Nodes: No mediastinal, hilar, or axillary adenopathy. The esophagus is fluid-filled. Lungs/Pleura: Small bilateral pleural effusions. Compressive atelectasis in the lower lobes. Musculoskeletal: No acute bony abnormality. CT ABDOMEN PELVIS FINDINGS Hepatobiliary: Fatty infiltration of the liver. No focal abnormality or biliary ductal dilatation. Gallbladder unremarkable. Pancreas: No focal abnormality or ductal dilatation. Mildly atrophic. Spleen: No focal abnormality.  Normal size. Adrenals/Urinary Tract: Areas of cortical thinning bilaterally. No hydronephrosis. Cysts noted in the mid to lower pole of the left kidney. No adrenal mass or visible urinary bladder abnormality. Stomach/Bowel: Large stool burden throughout the colon. Scattered sigmoid diverticula. Large masslike filling defect noted in the stomach which appears to be separate from the gastric wall. This contains gas within this large intraluminal mass. Fluid and gas surrounding mass. I favor this represents a bezoar. No visible gastric wall abnormality or gastric outlet obstruction. Small bowel is decompressed. Vascular/Lymphatic: Heavily calcified aorta and branch vessels. No aneurysm or adenopathy. Reproductive: Probable anterior uterine fibroid, best seen on sagittal imaging, measuring up to 3.2 cm. No adnexal mass. Other: No free fluid or free air. Musculoskeletal: No acute bony abnormality. IMPRESSION: Small bilateral pleural effusions with compressive atelectasis in the lower lobes. Coronary artery disease,  aortic atherosclerosis. Large intraluminal filling defect within the stomach, question bezoar. No visible gastric wall abnormality. This could be further evaluated with direct visualization/endoscopy. Esophagus is fluid-filled possibly related to reflux or dysmotility. Large stool burden throughout the colon. Fatty infiltration of the liver. Aortoiliac atherosclerosis. Electronically Signed   By: KRolm BaptiseM.D.   On: 06/01/2017 09:52   Dg Chest Port 1 View  Result Date: 05/05/2017 CLINICAL DATA:  CHF EXAM: PORTABLE CHEST 1 VIEW COMPARISON:  05/29/2017 FINDINGS: Support devices are stable. Cardiomegaly with vascular congestion, layering effusions and bilateral airspace opacities. Layering effusions appear more prominent which may be positional. IMPRESSION: Continued CHF with layering bilateral effusions. The effusions appear larger. However, this may be positional. Electronically Signed   By: KRolm BaptiseM.D.   On: 05/05/2017 09:48   Dg Chest Port 1 View  Result Date: 05/26/2017 CLINICAL DATA:  Nasogastric tube placement and intubation. History of breast cancer, colon cancer, congestive heart failure and diabetes. EXAM: PORTABLE CHEST 1 VIEW COMPARISON:  CT and radiographs earlier today. Radiographs 05/02/2017. FINDINGS: 1545 hours. Mild patient rotation to the right. Endotracheal tube has  been placed, terminating at the level of the mid trachea. There is a right IJ central venous catheter, the tip of which is not confidently identified due to overlap with the left subclavian pacemaker leads. The pacemaker leads appear unchanged. The heart size and mediastinal contours are stable allowing for patient rotation. There are persistent bilateral pleural effusions and bibasilar atelectasis. No evidence of pneumothorax. IMPRESSION: Satisfactory position of the endotracheal tube. Right IJ central venous catheter tip not well visualized. No evidence of pneumothorax or significant change in the pleural effusions.  Electronically Signed   By: Richardean Sale M.D.   On: 05/14/2017 16:24   Dg Chest Portable 1 View  Result Date: 05/14/2017 CLINICAL DATA:  Abdominal pain, cough, and bloody sputum. EXAM: PORTABLE CHEST 1 VIEW COMPARISON:  05/02/2017 FINDINGS: Cardiac pacemaker. Shallow inspiration with atelectasis in the lung bases. Cardiac enlargement. No vascular congestion, edema, or consolidation. Hazy appearance of the left costophrenic angle may represent small effusion. Calcified and tortuous aorta. No pneumothorax. IMPRESSION: Shallow inspiration with atelectasis in the lung bases. Possible left pleural effusion. Cardiac enlargement. No edema or consolidation. Aortic atherosclerosis. Electronically Signed   By: Lucienne Capers M.D.   On: 05/15/2017 06:45   [place] Ekg Site Rite  Result Date: 05/09/2017 If Site Rite image not attached, placement could not be confirmed due to current cardiac rhythm.  [place] Ekg Site Rite  Result Date: 05/21/2017 If Site Rite image not attached, placement could not be confirmed due to current cardiac rhythm.    Medications:   . sodium chloride 100 mL/hr at 05/05/17 0700  . sodium chloride 330 mL/hr at 05/05/17 0830  . albumin human    . amiodarone 30 mg/hr (05/05/17 0951)  . dextrose Stopped (05/22/2017 2151)  . epinephrine Stopped (05/20/2017 1815)  . fentaNYL 100 mcg/hr (05/05/17 1046)  . norepinephrine (LEVOPHED) Adult infusion 60 mcg/min (05/05/17 0934)  . pantoprozole (PROTONIX) infusion 8 mg/hr (05/05/17 0700)  . phenylephrine (NEO-SYNEPHRINE) Adult infusion 400 mcg/min (05/05/17 1022)  . piperacillin-tazobactam (ZOSYN)  IV 3.375 g (05/05/17 0744)  .  sodium bicarbonate (isotonic) infusion in sterile water 75 mL/hr at 05/05/17 0830  . vancomycin    . vasopressin (PITRESSIN) infusion - *FOR SHOCK* 0.03 Units/min (05/05/17 0700)   . budesonide  0.5 mg Nebulization BID  . chlorhexidine gluconate (MEDLINE KIT)  15 mL Mouth Rinse BID  . dextrose  100 mL Intravenous Once   . dextrose  3 ampule Intravenous Once  . dextrose  1 ampule Intravenous Once  . dextrose  1 ampule Intravenous Once  . dextrose  1 ampule Intravenous Once  . dextrose  1 ampule Intravenous Once  . divalproex  250 mg Oral BID  . etomidate  20 mg Intravenous Once  . feeding supplement (PRO-STAT SUGAR FREE 64)  30 mL Oral BID BM  . gabapentin  400 mg Oral TID  . hydrocortisone sod succinate (SOLU-CORTEF) inj  50 mg Intravenous Q8H  . insulin aspart  0-9 Units Subcutaneous Q4H  . ipratropium-albuterol  3 mL Nebulization QID  . ketotifen  1 drop Both Eyes BID  . magnesium oxide  400 mg Oral BID  . mouth rinse  15 mL Mouth Rinse QID  . nystatin   Topical Daily  . pantoprazole (PROTONIX) IV  40 mg Intravenous Once  . senna-docusate  1 tablet Oral BID  . sodium bicarbonate  100 mEq Intravenous Once  . sodium bicarbonate  100 mEq Intravenous Once  . sodium bicarbonate  50 mEq Intravenous Once  .  sodium bicarbonate  50 mEq Intravenous Once  . sucralfate  1 g Oral TID AC & HS   acetaminophen **OR** acetaminophen, albuterol, guaiFENesin-dextromethorphan, LORazepam, menthol-cetylpyridinium, ondansetron **OR** ondansetron (ZOFRAN) IV, polyethylene glycol, sodium chloride flush  Assessment/ Plan:  83 y.o. female with COPD 2 L O2 [place], Arthritis, h/o breast cancer, colon cancer, diabetes, wheelchair/bed bound status, pulmonary hypertension, coronary atherosclerosis, dual chamber cardiac pacemaker (02/2014), OSA with hypercarbia  1. oliguria 2. Multiorgan failure 3. Acute respiratory failure  Plan: Patient remains critically ill requiring multiple pressors and ventilatory support.  Oliguria is likely due to underlying critical illness and probably developing acute renal failure. Agree with volume resuscitation and hemodynamic support.  Lactic acid level is slowly improving.  Continue supportive care. We will follow closely.     LOS: [place] 5/2/201911:20 AM  [place], [place]  Note: This note was prepared with Dragon dictation. Any transcription errors are unintentional

## 2017-05-05 NOTE — Progress Notes (Signed)
Name: Kathryn Hayes MRN: [id number] DOB: April 28, 1934     CONSULTATION DATE: 05/23/2017  Subjective & Objective: Remains on vent, pressers, Bicarb gtt., oliguric, had to be started on Amiodarone because of A fib with RVR, B GLU has been trending up and was started on sedation.  PAST MEDICAL HISTORY :   has a past medical history of Anxiety, Arthritis, Breast cancer ([place]) (2007), Chest pain, unspecified, CHF (congestive heart failure) ([place]), Chronic headache, Colon cancer ([place]), Colon neoplasm, COPD (chronic obstructive pulmonary disease) ([place]), Depression, Diabetes mellitus, Fibrocystic breast, History of breast cancer, History of colon cancer, HTN (hypertension) with goal to be determined, Hyperlipidemia, Hyperlipidemia, unspecified, Hypertension, Obesity, unspecified, and Sleep apnea.  has a past surgical history that includes Breast lumpectomy (2007); Colon surgery (1993); Tubal ligation (Bilateral, 1971); Appendectomy; Colostomy; Partial colectomy; Tonsillectomy; Bunionectomy (Right); Dilation and curettage, diagnostic / therapeutic; Cataract extraction w/ intraocular lens implant & anterior vitrectomy, bilateral (Bilateral); Total knee arthroplasty (Right, 07/03/2013); Mastectomy partial / lumpectomy; and INSERT REPLACE REMOVE PACEMAKER. Prior to Admission medications   Medication Sig Start Date End Date Taking? Authorizing Provider  albuterol (PROVENTIL) (2.5 MG/3ML) 0.083% nebulizer solution Take 2.5 mg by nebulization every 6 (six) hours as needed for wheezing or shortness of breath.    Yes [provider]  aspirin EC 81 MG tablet Take 81 mg by mouth daily.    Yes [provider]  azithromycin (ZITHROMAX) 250 MG tablet Take 1 tablet (250 mg total) by mouth daily. 05/02/17  Yes Harvest Dark, MD  budesonide (PULMICORT) 0.5 MG/2ML nebulizer solution Take 0.5 mg by nebulization 2 (two) times daily.    Yes [provider]  Camphor-Eucalyptus-Menthol (VICKS  VAPORUB) 4.7-1.2-2.6 % OINT Apply liberal amount topically to fingernails at bedtime for Onychomycosis   Yes [provider]  Cholecalciferol 2000 units CAPS Take 2,000 Units by mouth daily.   Yes [provider]  clonazePAM (KLONOPIN) 0.5 MG tablet Give 1/2 tablet (0.25 mg) in the AM by mouth  and 1 whole tablet (0.5 mg) in the PM by mouth (Insurance doesn't cover Lorazepam) 03/30/17  Yes Toni Arthurs, NP  divalproex (DEPAKOTE SPRINKLE) 125 MG capsule Take 250 mg by mouth 2 (two) times daily.    Yes [provider]  DULoxetine (CYMBALTA) 60 MG capsule Take 60 mg by mouth daily.    Yes [provider]  feeding supplement, ENSURE ENLIVE, (ENSURE ENLIVE) LIQD Take 237 mLs by mouth 2 (two) times daily between meals. 03/04/17  Yes Gladstone Lighter, MD  fluticasone-salmeterol (ADVAIR HFA) 332-95 MCG/ACT inhaler Inhale 2 puffs into the lungs 2 (two) times daily. 8 am and 8 pm    Yes [provider]  furosemide (LASIX) 20 MG tablet Take 20 mg by mouth daily.    Yes [provider]  gabapentin (NEURONTIN) 400 MG capsule Take 400 mg by mouth 3 (three) times daily.   Yes [provider]  insulin aspart (NOVOLOG) 100 UNIT/ML injection 3 (three) times daily with meals. Per Sliding Scale; If Blood Sugar is 176 to 250, give 3 Units.If Blood Sugar is 251 to 350, give 6 Units.If Blood Sugar is 351 to 450, give 10 Units.  If BS >450, give 12 units and recheck in 1 hour. If still >450, call MD/NP.   Yes [provider]  Ipratropium-Albuterol (COMBIVENT RESPIMAT) 20-100 MCG/ACT AERS respimat Inhale 1 puff into the lungs 4 (four) times daily. RINSE MOUTH AFTER USE....Marland KitchenFOR PNEUMONIA AND BRONCHITIS   Yes [provider]  ketotifen (  ZADITOR) 0.025 % ophthalmic solution Place 1 drop into both eyes 2 (two) times daily.    Yes [provider]  magnesium oxide (MAG-OX) 400 MG tablet Take 400 mg by mouth 2 (two) times daily.    Yes [provider]  Melatonin 10 MG TABS Take 10 mg by mouth at bedtime.    Yes [provider]  Menthol 1 MG LOZG Use as directed 1 lozenge in the mouth or throat every 6 (six) hours as needed. For irritation/pain   Yes [provider]  metoCLOPramide (REGLAN) 5 MG tablet Take 5 mg by mouth 4 (four) times daily -  before meals and at bedtime.   Yes [provider]  metoprolol tartrate (LOPRESSOR) 25 MG tablet Take 12.5 mg by mouth 2 (two) times daily.   Yes [provider]  Multiple Vitamins-Minerals (SENIOR TABS) TABS Take 1 tablet by mouth daily.   Yes [provider]  nystatin (MYCOSTATIN/NYSTOP) powder Apply 1 application topically to abdominal folds daily for redness and irritation.   Yes [provider]  pantoprazole (PROTONIX) 20 MG tablet Take 20 mg by mouth daily.   Yes [provider]  potassium chloride (K-DUR,KLOR-CON) 10 MEQ tablet Take 10 mEq by mouth every other day.    Yes [provider]  pravastatin (PRAVACHOL) 10 MG tablet Take 10 mg by mouth daily at 6 PM. On Sunday   Yes [provider]  senna-docusate (SENOKOT-S) 8.6-50 MG tablet Take 1 tablet by mouth 2 (two) times daily.    Yes [provider]  sodium chloride 1 g tablet Take 1 tablet (1 g total) by mouth 2 (two) times daily with a meal. 03/04/17  Yes Gladstone Lighter, MD  sucralfate (CARAFATE) 1 g tablet Take 1 tablet by mouth 4 (four) times daily. 04/27/17  Yes [provider]  trandolapril (MAVIK) 2 MG tablet Take 2 mg by mouth daily.   Yes [provider]  acetaminophen (TYLENOL) 325 MG tablet Take 650 mg by mouth every 4 (four) hours as needed for mild pain, moderate pain or fever. Do not exceed 3000 mg in 24hrs     [provider]  Amino Acids-Protein Hydrolys (FEEDING SUPPLEMENT, PRO-STAT SUGAR FREE 64,) LIQD Take 30 mLs by mouth 2 (two) times daily between meals.    [provider]  bisacodyl  (DULCOLAX) 5 MG EC tablet Take 2 tablets (10 mg total) by mouth daily as needed for moderate constipation. 02/18/17   Demetrios Loll, MD  guaiFENesin (MUCINEX) 600 MG 12 hr tablet Take 1 tablet (600 mg total) by mouth 2 (two) times daily. Patient not taking: Reported on 05/06/2017 03/04/17   Gladstone Lighter, MD  guaiFENesin-dextromethorphan [facility] DM) 100-10 MG/5ML syrup Take 5 mLs by mouth every 4 (four) hours as needed for cough. 02/18/17   Demetrios Loll, MD  ibuprofen (ADVIL,MOTRIN) 600 MG tablet Take 600 mg by mouth every 6 (six) hours as needed for headache.    [provider]  Skin Protectants, Misc. (EUCERIN) cream Apply liberal amount topically to dry areas of skin daily after bath and as needed. Ok to keep at bedside    [provider]   No Known Allergies  FAMILY HISTORY:  family history includes Alzheimer's disease in her mother; Breast cancer in her mother; Colon cancer in her mother; Colon polyps in her sister; Factor V Leiden deficiency in her sister; Prostate cancer in her father. SOCIAL HISTORY:  reports that she quit smoking about 6 years ago.  Her smoking use included cigarettes. She has a 75.00 pack-year smoking history. She has never used smokeless tobacco. She reports that she does not drink alcohol or use drugs.  REVIEW OF SYSTEMS:   Unable to obtain due to critical illness   VITAL SIGNS: Temp:  [97.4 F (36.3 C)-97.9 F (36.6 C)] 97.9 F (36.6 C) (05/02 0615) Pulse Rate:  [35-168] 114 (05/02 0545) Resp:  [0-35] 22 (05/02 0700) BP: (40-141)/(10-117) 131/106 (05/02 0700) SpO2:  [75 %-100 %] 99 % (05/02 0725) FiO2 (%):  [80 %-100 %] 90 % (05/02 0725) Weight:  [96.1 kg (211 lb 13.8 oz)-106.6 kg (235 lb 0.2 oz)] 106.6 kg (235 lb 0.2 oz) (05/02 0409)  Physical Examination:  Sedated with Fentanyl to RASS of -3 On vent, no distress, BEAE and no rales. S1 & S2 audible with no murmur Benign abdomen with feeble peristalses Ext: wasted and no edema. Mottled  feet with palpable PP  ASSESSMENT / PLAN:  Acute respiratory failure. -Monitor ABG, optimize vent settings and continue with vent support  Altered mental status with hypoglycemia and metabolic encephalopathy.improved and was following commands, currently sedated on vent. -Monitor neuro status and consider CT head when stable  Septic shock -Optimize volume resuscitation, pressers, blood transfusion -Monitor Hemodynamics  Metabolic acidosis with Lactic acidemia with sepsis / mesenteric ischemia and tissue hypoxia -Hydration, empiric Vanc + Zosyn, Bicarb gtt. -Monitor ABG, renal panel, lactic acid  AKI with oligurea due to ATN and renal hypoperfusion -Optimize hydration, hemodynamics, avoid nephrotoxins, monitor renal panel and urine out put. -Follow with renal consult.  GI bleeding -PPI -Monitor H & H and keep HB > 9 gm/dl. -Follow with GI, awaiting EGD when stable  UTI -Empiric Vanc + Zosyn -Follow with culture  Uncontrolled DM after severe hypoglycemia -D/C Octreotide SQ, taper Hydrocortisone and start optimize glycemic control.  H/o HFpEF, grade I LV diastolic dysfunction ECHO in 02/2017 LV EF 55-60% and Grade I LV diastolic dysfunction  Full code  DVT & GI prophylaxis. Continue with supportive care  Critical care time 50 min

## 2017-05-05 NOTE — Progress Notes (Addendum)
Initial Nutrition Assessment  DOCUMENTATION CODES:   Morbid obesity  INTERVENTION:   Pt is not hemodynamically stable at this time.  If patient becomes stable, recommend Vital High Protein @ 55 mL/hr. This provides 1320 kcal, 115.5 gm protein, 1109 mL water.   Recommend liquid MVI to meet 100% RDI  NUTRITION DIAGNOSIS:   Inadequate oral intake related to inability to eat as evidenced by NPO status.  GOAL:   Provide needs based on ASPEN/SCCM guidelines  MONITOR:   Vent status, Labs, I & O's, Weight trends, Skin  REASON FOR ASSESSMENT:   Ventilator   ASSESSMENT:   83 y.o. female with a personal history of breast cancer, colon cancer status post partial colectomy (2000) diabetes mellitus, COPD, HTN, COPD, gastroparesis who is admitted for spitting up blood and abdominal pain.  Pt admitted with sepsis secondary to UTI  Pt is intubated and sedated. Pt's daughter and son-in-law present at time of visit. Daughter provided brief food history- Pt with decreased appetite and poor po intake for ~ 1 month PTA. They attributed this to the gastroparesis, but brought pt in from nursing facility when she started spitting up blood. Noted large mass found in pt's stomach of unknown etiology. Daughter reports pt has lost ~20 lbs in the last month. Unable to confirm this in patient's chart.   Noted edema in all four extremities. Was unable to assess lower extremities and dorsal hand during nutrition focused physical exam.  Access: OG tube placed 05/14/2017. Tube ends in distal stomach per abdominal x-ray 5/1. 80 cm center mouth  MAP: 26-115 since midnight 5/2.  Pressors: Norepinephrine 60 mcg/min, phenylephrine 400 mcg/min, vasopressin 11.3 mL/hr  Patient is currently intubated on ventilator support Ve: 11 L/min Temp (24hrs), Avg:97.6 F (36.4 C), Min:97.4 F (36.3 C), Max:98 F (36.7 C)  Propofol: N/A  Medications reviewed and include: pro-stat 30 mL BID, novolog, magnesium oxide 400  mg, sucralfate, NaCl infusion 100 ml/hr, albumin 50 g, amiodarone, dextrose 10% 200 mL/hr (starting 5/1 @ 4:30pm), fentanyl, norepinephrine 60 mcg/min, pantoprazole, phenylephrine 400 mcg/min, zosyn, Na bicarb @ 75 ml, vancomycin, vasopressin 11.3 mL/hr, lorazepam  Labs reviewed: Na 131(L), K 4.2 wnl, Glucose 538-804 x 24 hrs, Phos 4.6 wnl, Magnesium 2.2 wnl, GFR 37(L), Lactic acid 8.2(H), Troponin 0.23(H)  I/O: In 2423 IV, 778 blood; Out: 735 urine, 200 emesis/NG  Weight trend: Pt with 31 lb weight gain in 1 month-suspect this is at least partially d/t fluid  Reviewed with nurse and on rounds.   NUTRITION - FOCUSED PHYSICAL EXAM:    Most Recent Value  Orbital Region  No depletion  Upper Arm Region  No depletion  Thoracic and Lumbar Region  No depletion  Buccal Region  No depletion  Temple Region  No depletion  Clavicle Bone Region  No depletion  Clavicle and Acromion Bone Region  No depletion  Scapular Bone Region  No depletion  Dorsal Hand  Unable to assess  Patellar Region  Unable to assess  Anterior Thigh Region  Unable to assess  Posterior Calf Region  Unable to assess  Edema (RD Assessment)  Mild  Hair  Reviewed  Eyes  Unable to assess  Mouth  Unable to assess  Skin  Reviewed [noted bruising on bilateral hands and feet]  Nails  Reviewed      Diet Order:   Diet Order           Diet NPO time specified Except for: Sips with Meds  Diet effective now  EDUCATION NEEDS:   Not appropriate for education at this time  Skin:  Skin Assessment: Reviewed RN Assessment  Last BM:  05/28/2017(per nurse)  Height:   Ht Readings from Last 1 Encounters:  05/26/2017 4\' 11"  (1.499 m)    Weight:   Wt Readings from Last 1 Encounters:  05/05/17 235 lb 0.2 oz (106.6 kg)    Ideal Body Weight:  43.2 kg  BMI:  Body mass index is 47.47 kg/m.  Estimated Nutritional Needs:   Kcal:  1018-1296 kcal (11-14 kcal/kg est. dry wt)  Protein:  108 gm/day (IBW x 2.5)  Fluid:   1.3-1.5 L/day or per MD  Alfonse Ras, [place] Dietetic Intern [phone]

## 2017-05-05 NOTE — Progress Notes (Signed)
Pharmacy note:  Amiodarone interaction check Ondansetron combined with amiodarone may increase risk of QT prolongation. However other antinausea medications available at this facility may potentially have a similar risk. Recommend careful monitoring while this combination is in use.  Sim Boast, PharmD, BCPS  05/05/17 12:29 AM

## 2017-05-06 ENCOUNTER — Inpatient Hospital Stay: Payer: Medicare Other

## 2017-05-06 DIAGNOSIS — J9601 Acute respiratory failure with hypoxia: Secondary | ICD-10-CM

## 2017-05-06 LAB — CBC WITH DIFFERENTIAL/PLATELET
Basophils Absolute: 0 10*3/uL (ref 0–0.1)
Basophils Relative: 0 %
EOS ABS: 0 10*3/uL (ref 0–0.7)
EOS PCT: 0 %
HCT: 28.8 % — ABNORMAL LOW (ref 35.0–47.0)
HEMOGLOBIN: 9.4 g/dL — AB (ref 12.0–16.0)
Lymphocytes Relative: 2 %
Lymphs Abs: 0.7 10*3/uL — ABNORMAL LOW (ref 1.0–3.6)
MCH: 29.2 pg (ref 26.0–34.0)
MCHC: 32.5 g/dL (ref 32.0–36.0)
MCV: 89.7 fL (ref 80.0–100.0)
MONO ABS: 0.7 10*3/uL (ref 0.2–0.9)
Monocytes Relative: 2 %
NEUTROS ABS: 43.3 10*3/uL — AB (ref 1.4–6.5)
Neutrophils Relative %: 96 %
Platelets: 162 10*3/uL (ref 150–440)
RBC: 3.22 MIL/uL — AB (ref 3.80–5.20)
RDW: 16.8 % — ABNORMAL HIGH (ref 11.5–14.5)
WBC: 44.7 10*3/uL — ABNORMAL HIGH (ref 3.6–11.0)

## 2017-05-06 LAB — HEMOGLOBIN AND HEMATOCRIT, BLOOD
HCT: 26.1 % — ABNORMAL LOW (ref 35.0–47.0)
HEMATOCRIT: 29.3 % — AB (ref 35.0–47.0)
Hemoglobin: 8.6 g/dL — ABNORMAL LOW (ref 12.0–16.0)
Hemoglobin: 9.7 g/dL — ABNORMAL LOW (ref 12.0–16.0)

## 2017-05-06 LAB — APTT: aPTT: 46 seconds — ABNORMAL HIGH (ref 24–36)

## 2017-05-06 LAB — BASIC METABOLIC PANEL
Anion gap: 12 (ref 5–15)
Anion gap: 12 (ref 5–15)
BUN: 15 mg/dL (ref 6–20)
BUN: 17 mg/dL (ref 6–20)
CALCIUM: 7 mg/dL — AB (ref 8.9–10.3)
CHLORIDE: 94 mmol/L — AB (ref 101–111)
CO2: 29 mmol/L (ref 22–32)
CO2: 29 mmol/L (ref 22–32)
CREATININE: 1.09 mg/dL — AB (ref 0.44–1.00)
Calcium: 7 mg/dL — ABNORMAL LOW (ref 8.9–10.3)
Chloride: 95 mmol/L — ABNORMAL LOW (ref 101–111)
Creatinine, Ser: 1.05 mg/dL — ABNORMAL HIGH (ref 0.44–1.00)
GFR calc Af Amer: 54 mL/min — ABNORMAL LOW (ref >60)
GFR calc Af Amer: 56 mL/min — ABNORMAL LOW (ref >60)
GFR calc non Af Amer: 46 mL/min — ABNORMAL LOW (ref >60)
GFR, EST NON AFRICAN AMERICAN: 48 mL/min — AB (ref >60)
Glucose, Bld: 162 mg/dL — ABNORMAL HIGH (ref 65–99)
Glucose, Bld: 108 mg/dL — ABNORMAL HIGH (ref 65–99)
POTASSIUM: 3.3 mmol/L — AB (ref 3.5–5.1)
POTASSIUM: 2.8 mmol/L — AB (ref 3.5–5.1)
SODIUM: 136 mmol/L (ref 135–145)
Sodium: 135 mmol/L (ref 135–145)

## 2017-05-06 LAB — URINE CULTURE: CULTURE: NO GROWTH

## 2017-05-06 LAB — PROTIME-INR
INR: 2.3
PROTHROMBIN TIME: 25.1 s — AB (ref 11.4–15.2)

## 2017-05-06 LAB — GLUCOSE, CAPILLARY
GLUCOSE-CAPILLARY: 124 mg/dL — AB (ref 65–99)
GLUCOSE-CAPILLARY: 178 mg/dL — AB (ref 65–99)
GLUCOSE-CAPILLARY: 215 mg/dL — AB (ref 65–99)
GLUCOSE-CAPILLARY: 294 mg/dL — AB (ref 65–99)
Glucose-Capillary: 127 mg/dL — ABNORMAL HIGH (ref 65–99)
Glucose-Capillary: 95 mg/dL (ref 65–99)

## 2017-05-06 LAB — PHOSPHORUS
PHOSPHORUS: 3 mg/dL (ref 2.5–4.6)
Phosphorus: 2.8 mg/dL (ref 2.5–4.6)

## 2017-05-06 LAB — CALCIUM, IONIZED
CALCIUM, IONIZED, SERUM: 3.4 mg/dL — AB (ref 4.5–5.6)
CALCIUM, IONIZED, SERUM: 3.5 mg/dL — AB (ref 4.5–5.6)
CALCIUM, IONIZED, SERUM: 3.1 mg/dL — AB (ref 4.5–5.6)

## 2017-05-06 LAB — HEPATIC FUNCTION PANEL
ALBUMIN: 2.5 g/dL — AB (ref 3.5–5.0)
ALT: 1261 U/L — AB (ref 14–54)
AST: 1707 U/L — AB (ref 15–41)
Alkaline Phosphatase: 70 U/L (ref 38–126)
BILIRUBIN DIRECT: 0.7 mg/dL — AB (ref 0.1–0.5)
BILIRUBIN TOTAL: 1.5 mg/dL — AB (ref 0.3–1.2)
Indirect Bilirubin: 0.8 mg/dL (ref 0.3–0.9)
Total Protein: 3.8 g/dL — ABNORMAL LOW (ref 6.5–8.1)

## 2017-05-06 LAB — VANCOMYCIN, TROUGH: VANCOMYCIN TR: 12 ug/mL — AB (ref 15–20)

## 2017-05-06 LAB — MAGNESIUM
MAGNESIUM: 1.7 mg/dL (ref 1.7–2.4)
Magnesium: 1.8 mg/dL (ref 1.7–2.4)

## 2017-05-06 LAB — PROCALCITONIN: Procalcitonin: 1.56 ng/mL

## 2017-05-06 MED ORDER — PANTOPRAZOLE SODIUM 40 MG IV SOLR: 40.0000 mg | Freq: Two times a day (BID) | INTRAVENOUS | Status: DC

## 2017-05-06 MED ORDER — MAGNESIUM SULFATE 2 GM/50ML IV SOLN
2.0000 g | Freq: Once | INTRAVENOUS | Status: AC
  Administered 2017-05-06: 2 g via INTRAVENOUS
  Filled 2017-05-06: qty 50

## 2017-05-06 MED ORDER — MORPHINE BOLUS VIA INFUSION
5.0000 mg | INTRAVENOUS | Status: DC | PRN
  Administered 2017-05-06: 2 mg via INTRAVENOUS
  Administered 2017-05-06: 5 mg via INTRAVENOUS
  Filled 2017-05-06: qty 20

## 2017-05-06 MED ORDER — LACTATED RINGERS IV SOLN
INTRAVENOUS | Status: DC
  Administered 2017-05-06: 14:00:00 via INTRAVENOUS

## 2017-05-06 MED ORDER — MORPHINE 100MG IN NS 100ML (1MG/ML) PREMIX INFUSION
10.0000 mg/h | INTRAVENOUS | Status: DC
  Administered 2017-05-06: 5 mg/h via INTRAVENOUS
  Filled 2017-05-06: qty 100

## 2017-05-06 MED ORDER — MORPHINE SULFATE (PF) 2 MG/ML IV SOLN
2.0000 mg | INTRAVENOUS | Status: DC | PRN
  Administered 2017-05-06 (×2): 2 mg via INTRAVENOUS
  Filled 2017-05-06 (×2): qty 1

## 2017-05-06 MED ORDER — HYDROCORTISONE NA SUCCINATE PF 100 MG IJ SOLR: 25.0000 mg | Freq: Two times a day (BID) | INTRAMUSCULAR | Status: DC

## 2017-05-06 MED ORDER — ALBUMIN HUMAN 25 % IV SOLN
50.0000 g | Freq: Once | INTRAVENOUS | Status: AC
  Administered 2017-05-06: 50 g via INTRAVENOUS
  Filled 2017-05-06: qty 200

## 2017-05-06 MED ORDER — LACTATED RINGERS IV SOLN
INTRAVENOUS | Status: DC
  Filled 2017-05-06 (×3): qty 1000

## 2017-05-06 MED ORDER — LORAZEPAM 2 MG/ML IJ SOLN
2.0000 mg | INTRAMUSCULAR | Status: DC | PRN
  Administered 2017-05-06 (×2): 2 mg via INTRAVENOUS
  Filled 2017-05-06 (×2): qty 1

## 2017-05-06 MED ORDER — POTASSIUM CHLORIDE 2 MEQ/ML IV SOLN
INTRAVENOUS | Status: DC
  Administered 2017-05-06: 18:00:00 via INTRAVENOUS
  Filled 2017-05-06 (×3): qty 1000

## 2017-05-06 MED ORDER — SODIUM CHLORIDE 0.9 % IV SOLN
Freq: Once | INTRAVENOUS | Status: DC
  Administered 2017-05-06: 18:00:00 via INTRAVENOUS
  Filled 2017-05-06: qty 15

## 2017-05-06 NOTE — Progress Notes (Addendum)
Name: Kathryn Hayes MRN: [id number] DOB: 01-Apr-1934     CONSULTATION DATE: 05/22/2017  Subjective & Objective: Off sedation, on Neo-Synephrine + Vasopressin + Amiodarone.  PAST MEDICAL HISTORY :   has a past medical history of Anxiety, Arthritis, Breast cancer ([place]) (2007), Chest pain, unspecified, CHF (congestive heart failure) ([place]), Chronic headache, Colon cancer ([place]), Colon neoplasm, COPD (chronic obstructive pulmonary disease) ([place]), Depression, Diabetes mellitus, Fibrocystic breast, History of breast cancer, History of colon cancer, HTN (hypertension) with goal to be determined, Hyperlipidemia, Hyperlipidemia, unspecified, Hypertension, Obesity, unspecified, and Sleep apnea.  has a past surgical history that includes Breast lumpectomy (2007); Colon surgery (1993); Tubal ligation (Bilateral, 1971); Appendectomy; Colostomy; Partial colectomy; Tonsillectomy; Bunionectomy (Right); Dilation and curettage, diagnostic / therapeutic; Cataract extraction w/ intraocular lens implant & anterior vitrectomy, bilateral (Bilateral); Total knee arthroplasty (Right, 07/03/2013); Mastectomy partial / lumpectomy; and INSERT REPLACE REMOVE PACEMAKER. Prior to Admission medications   Medication Sig Start Date End Date Taking? Authorizing Provider  albuterol (PROVENTIL) (2.5 MG/3ML) 0.083% nebulizer solution Take 2.5 mg by nebulization every 6 (six) hours as needed for wheezing or shortness of breath.    Yes [provider]  aspirin EC 81 MG tablet Take 81 mg by mouth daily.    Yes [provider]  azithromycin (ZITHROMAX) 250 MG tablet Take 1 tablet (250 mg total) by mouth daily. 05/02/17  Yes Harvest Dark, MD  budesonide (PULMICORT) 0.5 MG/2ML nebulizer solution Take 0.5 mg by nebulization 2 (two) times daily.    Yes [provider]  Camphor-Eucalyptus-Menthol (VICKS VAPORUB) 4.7-1.2-2.6 % OINT Apply liberal amount topically to fingernails at bedtime for Onychomycosis   Yes  [provider]  Cholecalciferol 2000 units CAPS Take 2,000 Units by mouth daily.   Yes [provider]  clonazePAM (KLONOPIN) 0.5 MG tablet Give 1/2 tablet (0.25 mg) in the AM by mouth  and 1 whole tablet (0.5 mg) in the PM by mouth (Insurance doesn't cover Lorazepam) 03/30/17  Yes Toni Arthurs, NP  divalproex (DEPAKOTE SPRINKLE) 125 MG capsule Take 250 mg by mouth 2 (two) times daily.    Yes [provider]  DULoxetine (CYMBALTA) 60 MG capsule Take 60 mg by mouth daily.    Yes [provider]  feeding supplement, ENSURE ENLIVE, (ENSURE ENLIVE) LIQD Take 237 mLs by mouth 2 (two) times daily between meals. 03/04/17  Yes Gladstone Lighter, MD  fluticasone-salmeterol (ADVAIR HFA) 580-99 MCG/ACT inhaler Inhale 2 puffs into the lungs 2 (two) times daily. 8 am and 8 pm    Yes [provider]  furosemide (LASIX) 20 MG tablet Take 20 mg by mouth daily.    Yes [provider]  gabapentin (NEURONTIN) 400 MG capsule Take 400 mg by mouth 3 (three) times daily.   Yes [provider]  insulin aspart (NOVOLOG) 100 UNIT/ML injection 3 (three) times daily with meals. Per Sliding Scale; If Blood Sugar is 176 to 250, give 3 Units.If Blood Sugar is 251 to 350, give 6 Units.If Blood Sugar is 351 to 450, give 10 Units.  If BS >450, give 12 units and recheck in 1 hour. If still >450, call MD/NP.   Yes [provider]  Ipratropium-Albuterol (COMBIVENT RESPIMAT) 20-100 MCG/ACT AERS respimat Inhale 1 puff into the lungs 4 (four) times daily. RINSE MOUTH AFTER USE....Marland KitchenFOR PNEUMONIA AND BRONCHITIS   Yes [provider]  ketotifen (ZADITOR) 0.025 % ophthalmic solution Place 1 drop into both eyes 2 (two) times daily.    Yes [provider]  magnesium oxide (MAG-OX) 400 MG tablet Take 400 mg by mouth 2 (two) times daily.    Yes [provider]  Melatonin 10 MG TABS Take 10 mg by mouth at bedtime.    Yes [provider]    Menthol 1 MG LOZG Use as directed 1 lozenge in the mouth or throat every 6 (six) hours as needed. For irritation/pain   Yes [provider]  metoCLOPramide (REGLAN) 5 MG tablet Take 5 mg by mouth 4 (four) times daily -  before meals and at bedtime.   Yes [provider]  metoprolol tartrate (LOPRESSOR) 25 MG tablet Take 12.5 mg by mouth 2 (two) times daily.   Yes [provider]  Multiple Vitamins-Minerals (SENIOR TABS) TABS Take 1 tablet by mouth daily.   Yes [provider]  nystatin (MYCOSTATIN/NYSTOP) powder Apply 1 application topically to abdominal folds daily for redness and irritation.   Yes [provider]  pantoprazole (PROTONIX) 20 MG tablet Take 20 mg by mouth daily.   Yes [provider]  potassium chloride (K-DUR,KLOR-CON) 10 MEQ tablet Take 10 mEq by mouth every other day.    Yes [provider]  pravastatin (PRAVACHOL) 10 MG tablet Take 10 mg by mouth daily at 6 PM. On Sunday   Yes [provider]  senna-docusate (SENOKOT-S) 8.6-50 MG tablet Take 1 tablet by mouth 2 (two) times daily.    Yes [provider]  sodium chloride 1 g tablet Take 1 tablet (1 g total) by mouth 2 (two) times daily with a meal. 03/04/17  Yes Gladstone Lighter, MD  sucralfate (CARAFATE) 1 g tablet Take 1 tablet by mouth 4 (four) times daily. 04/27/17  Yes [provider]  trandolapril (MAVIK) 2 MG tablet Take 2 mg by mouth daily.   Yes [provider]  acetaminophen (TYLENOL) 325 MG tablet Take 650 mg by mouth every 4 (four) hours as needed for mild pain, moderate pain or fever. Do not exceed 3000 mg in 24hrs     [provider]  Amino Acids-Protein Hydrolys (FEEDING SUPPLEMENT, PRO-STAT SUGAR FREE 64,) LIQD Take 30 mLs by mouth 2 (two) times daily between meals.    [provider]  bisacodyl (DULCOLAX) 5 MG EC tablet Take 2 tablets (10 mg total) by mouth daily as needed for moderate constipation.  02/18/17   Demetrios Loll, MD  guaiFENesin (MUCINEX) 600 MG 12 hr tablet Take 1 tablet (600 mg total) by mouth 2 (two) times daily. Patient not taking: Reported on 05/23/2017 03/04/17   Gladstone Lighter, MD  guaiFENesin-dextromethorphan [facility] DM) 100-10 MG/5ML syrup Take 5 mLs by mouth every 4 (four) hours as needed for cough. 02/18/17   Demetrios Loll, MD  ibuprofen (ADVIL,MOTRIN) 600 MG tablet Take 600 mg by mouth every 6 (six) hours as needed for headache.    [provider]  Skin Protectants, Misc. (EUCERIN) cream Apply liberal amount topically to dry areas of skin daily after bath and as needed. Ok to keep at bedside    [provider]   No Known Allergies  FAMILY HISTORY:  family history includes Alzheimer's disease in her mother; Breast cancer in her mother; Colon cancer in her mother; Colon polyps in her sister; Factor V Leiden deficiency in her sister; Prostate cancer in her father. SOCIAL HISTORY:  reports that she quit smoking about 6 years ago. Her smoking use included cigarettes. She has a 75.00 pack-year smoking history. She has never used smokeless tobacco. She reports that she  does not drink alcohol or use drugs.  REVIEW OF SYSTEMS:   Unable to obtain due to critical illness   VITAL SIGNS: Temp:  [97.8 F (36.6 C)-99.3 F (37.4 C)] 99.3 F (37.4 C) (05/03 0900) Pulse Rate:  [88-127] 103 (05/03 0900) Resp:  [17-24] 24 (05/03 0900) BP: (51-125)/(28-77) 99/67 (05/03 0900) SpO2:  [82 %-99 %] 91 % (05/03 1130) FiO2 (%):  [90 %-100 %] 100 % (05/03 1130) Weight:  [112.9 kg (248 lb 14.4 oz)] 112.9 kg (248 lb 14.4 oz) (05/03 0500)  Physical Examination:  Off sedation and unresponsive, not triggering the vent. On vent, no distress, BEAE and no rales. S1 & S2 audible with no murmur Benign abdomen with feeble peristalses Ext: wasted and no edema. Mottled feet with palpable PP.  ASSESSMENT / PLAN: Acute respiratory failure.Poor weaning parameters. -Monitor ABG,  optimize vent settings and continue with vent support.  Altered mental status with hypoglycemia and metabolic encephalopathy.worsening, unresponsive and has been off sedation. -Monitor neuro status and follow with CT head.   Septic shock -Optimize volume resuscitation, pressers, blood transfusion -Monitor Hemodynamics  Metabolic acidosis (improved) withLactic acidemiawith sepsis / mesenteric ischemia and tissue hypoxia -Hydration, empiric Vanc + Zosyn, d/c Bicarb gtt. -Monitor ABG, renal panel, lactic acid  AKI (improved) with oligurea due to ATN and renal hypoperfusion -Optimize hydration, hemodynamics, avoid nephrotoxins, monitor renal panel and urine out put. -Follow with renal.  GI bleeding. Gastric neoplasm is unlikely as per GI. -PPI -Monitor H & H and keep HB > 9 gm/dl. -Follow with GI.  UTI -Empiric Vanc + Zosyn -Follow with culture  Uncontrolled DM after severe hypoglycemia -D/C Octreotide SQ, taper Hydrocortisone and start optimize glycemic control.  A fib with RVR on Amiodarone for rate control. H/o HFpEF, grade I LV diastolic dysfunction ECHO in 02/2017 LV EF 55-60% and Grade I LV diastolic dysfunction  Mottled feet with vasopressors with palpable PP.  DNR  DVT & GI prophylaxis. Continue with supportive care  [place] discussion with the family at the bedside,they were updated with the patient progress, code status was readdressed and she is DNR. Patient are considering withdraw of support if she continues to decline.  Critical care time 50 min   05/06/2017  07:00 P  Family was updated with the result of CT head. They decided to proceed with comfort measures and withdraw of support.

## 2017-05-06 NOTE — Progress Notes (Signed)
Pt made comfort care just prior to shift change. Extubated by RT at 1925. Family at bedside.

## 2017-05-06 NOTE — Progress Notes (Signed)
   05/06/17 1121  Clinical Encounter Type  Visited With Patient and family together  Visit Type Follow-up  Spiritual Encounters  Spiritual Needs Emotional   Chaplain followed up with patient/family.  Patient in bed, did not engage.  Chaplain spoke with patient's son Herbie [place].  He reported that he was waiting to speak with doctor and gain more information.  He spoke of possible decisions that he and siblings may need to make based on physician report.   He said that he didn't want to assume things "were bad," but wonders if they are.  Son reported that family and patients had had prior conversations about what patient wishes were for medical care.  Chaplain spoke of ongoing chaplain availability and encouraged son to have chaplain paged as needed.

## 2017-05-06 NOTE — Progress Notes (Signed)
[facility], [place] 05/06/17  Subjective:   Patient known to our practice from outpatient follow-up She is admitted because of ongoing abdominal pain for about a month.  She had a CT of the abdomen that showed a possible mass.   Patient continues to be critically ill.  Requiring ventilator support and multiple pressors.  OG tube still has coffee-ground aspirate.  FiO2 is now 100%.  Serum creatinine remains stable at 1.09.  Urine output is good at 1495 cc last 24 hours Patient also has developed significant edema  Objective:  Vital signs in last 24 hours:  Temp:  [97.9 F (36.6 C)-99.3 F (37.4 C)] 99.3 F (37.4 C) (05/03 0900) Pulse Rate:  [88-127] 103 (05/03 0900) Resp:  [17-24] 24 (05/03 0900) BP: (51-125)/(28-75) 99/67 (05/03 0900) SpO2:  [82 %-99 %] 91 % (05/03 1130) FiO2 (%):  [90 %-100 %] 100 % (05/03 1130) Weight:  [248 lb 14.4 oz (112.9 kg)] 248 lb 14.4 oz (112.9 kg) (05/03 0500)  Weight change: 37 lb 0.6 oz (16.8 kg) Filed Weights   06/02/2017 1359 05/05/17 0409 05/06/17 0500  Weight: 211 lb 13.8 oz (96.1 kg) 235 lb 0.2 oz (106.6 kg) 248 lb 14.4 oz (112.9 kg)    Intake/Output:    Intake/Output Summary (Last 24 hours) at 05/06/2017 1351 Last data filed at 05/06/2017 1257 Gross per 24 hour  Intake 6224.15 ml  Output 3695 ml  Net 2529.15 ml     Physical Exam: General:  Critically ill-appearing, morbidly obese  HEENT  ET tube, OG tube      Pulm/lungs  vent assisted  CVS/Heart Tachycardic, irregular  Abdomen:  obese  Extremities: ++ edema  Neurologic: sedated  Skin: Extremities are Cool to touch          Basic Metabolic Panel:  Recent Labs  Lab 05/25/2017 2148 05/05/17 0121 05/05/17 0538 05/05/17 1353 05/05/17 2116 05/06/17 0455  NA 132* 132* 131* 133* 136 135  K 4.2 4.0 4.2 3.3* 3.7 3.3*  CL 92* 93* 93* 96* 98* 94*  CO2 _0 GLUCOSE 804* 619* 538* 389* 210* 162*  BUN _1 CREATININE 1.17*  1.14* 1.32* 1.13* 1.06* 1.09*  CALCIUM 6.3* 6.3* 6.2* 6.0* 6.8* 7.0*  MG 1.7 2.5* 2.2 1.8 1.8 1.8  PHOS 5.0*  --  4.6 3.3 3.3 3.0     CBC: Recent Labs  Lab 05/02/17 1354 05/02/17 1832 05/16/2017 0641  05/05/17 0947 05/05/17 1614 05/05/17 2116 05/06/17 0455 05/06/17 1027  WBC 14.4* 12.6* 18.4*  --   --   --   --   --   --   NEUTROABS 13.3* 11.3* 14.7*  --   --   --   --   --   --   HGB 11.4* 12.1 10.6*   < > 12.7 10.7* 10.3* 8.6* 9.7*  HCT 35.0 36.5 31.8*   < > 38.7 32.1* 31.1* 26.1* 29.3*  MCV 89.8 89.8 91.2  --   --   --   --   --   --   PLT 297 334 353  --   --   --   --   --   --    < > = values in this interval not displayed.     No results found for: HEPBSAG, HEPBSAB, HEPBIGM    Microbiology:  Recent Results (from the past 240 hour(s))  Blood culture (routine x 2)  Status: None (Preliminary result)   Collection Time: 05/12/2017  7:39 AM  Result Value Ref Range Status   Specimen Description BLOOD RIGHT [facility]  Final   Special Requests   Final    BOTTLES DRAWN AEROBIC AND ANAEROBIC Blood Culture adequate volume   Culture   Final    NO GROWTH 2 DAYS Performed at [facility], [address], [place], [place] [address]    Report Status PENDING  Incomplete  MRSA PCR Screening     Status: None   Collection Time: 05/17/2017  2:11 PM  Result Value Ref Range Status   MRSA by PCR NEGATIVE NEGATIVE Final    Comment:        The GeneXpert MRSA Assay (FDA approved for NASAL specimens only), is one component of a comprehensive MRSA colonization surveillance program. It is not intended to diagnose MRSA infection nor to guide or monitor treatment for MRSA infections. Performed at [facility], [address] [place], [place] [address]   Urine Culture     Status: None   Collection Time: 05/05/17  9:07 AM  Result Value Ref Range Status   Specimen Description   Final    URINE, RANDOM Performed at [facility], [address]  [place], [place] [address]    Special Requests   Final    NONE Performed at [facility], [address], [place], [place] [address]    Culture   Final    NO GROWTH Performed at [facility], [place] [address] [place], [place] [address]    Report Status 05/06/2017 FINAL  Final    Coagulation Studies: Recent Labs    05/26/2017 0738  LABPROT 15.6*  INR 1.25    Urinalysis: Recent Labs    05/09/2017 1025  COLORURINE AMBER*  LABSPEC 1.030  PHURINE 5.0  GLUCOSEU NEGATIVE  HGBUR NEGATIVE  BILIRUBINUR NEGATIVE  KETONESUR NEGATIVE  PROTEINUR 30*  NITRITE NEGATIVE  LEUKOCYTESUR LARGE*      Imaging: Dg Abd 1 View  Result Date: 05/16/2017 CLINICAL DATA:  Nasogastric tube placement and intubation. History of breast cancer, colon cancer, congestive heart failure and diabetes. EXAM: ABDOMEN - 1 VIEW COMPARISON:  CT earlier the same date.  Radiographs 02/16/2017. FINDINGS: 1545 hours. Nasogastric tube projects to the distal stomach. There is prominent stool throughout the colon which is mildly dilated diffusely. Probable rectal prolapse and gastric bezoar as correlated with previous CT. Vascular calcifications and mild spondylosis noted. IMPRESSION: Nasogastric tube extends into the distal stomach. Prominent stool throughout the colon, similar to previous CT. Electronically Signed   By: Richardean Sale M.D.   On: 05/05/2017 16:29   Ct Head Wo Contrast  Result Date: 05/06/2017 CLINICAL DATA:  Altered mental status. EXAM: CT HEAD WITHOUT CONTRAST TECHNIQUE: Contiguous axial images were obtained from the base of the skull through the vertex without intravenous contrast. COMPARISON:  CT scan of March 01, 2017. FINDINGS: Brain: Mild diffuse cortical atrophy is noted. Mild chronic ischemic white matter disease is noted. No mass effect or midline shift is noted. Ventricular size is within normal limits. There is no evidence of mass lesion, hemorrhage or acute infarction. Vascular: No  hyperdense vessel or unexpected calcification. Skull: Normal. Negative for fracture or focal lesion. Sinuses/Orbits: Mild bilateral maxillary sinusitis is noted. Other: None. IMPRESSION: Mild diffuse cortical atrophy. Mild chronic ischemic white matter disease. No acute intracranial abnormality seen. Electronically Signed   By: Marijo Conception, M.D.   On: 05/06/2017 13:45  Dg Chest Port 1 View  Result Date: 05/06/2017 CLINICAL DATA:  Evaluation pneumonia. History of hypertension and breast cancer. History of diabetes, COPD, CHF. EXAM: PORTABLE CHEST 1 VIEW COMPARISON:  05/05/2017. FINDINGS: Endotracheal tube and NG tube in stable position. Right IJ line noted with tip at cavoatrial junction. Cardiac pacer with lead tips over the right atrium right ventricle. Cardiomegaly. Diffuse bilateral pulmonary infiltrates and bilateral pleural effusions consistent with CHF. Similar findings from prior exam. IMPRESSION: No active disease. Electronically Signed   By: Marcello Moores  Register   On: 05/06/2017 13:05   Dg Chest Port 1 View  Result Date: 05/05/2017 CLINICAL DATA:  CHF EXAM: PORTABLE CHEST 1 VIEW COMPARISON:  05/19/2017 FINDINGS: Support devices are stable. Cardiomegaly with vascular congestion, layering effusions and bilateral airspace opacities. Layering effusions appear more prominent which may be positional. IMPRESSION: Continued CHF with layering bilateral effusions. The effusions appear larger. However, this may be positional. Electronically Signed   By: Rolm Baptise M.D.   On: 05/05/2017 09:48   Dg Chest Port 1 View  Result Date: 05/11/2017 CLINICAL DATA:  Nasogastric tube placement and intubation. History of breast cancer, colon cancer, congestive heart failure and diabetes. EXAM: PORTABLE CHEST 1 VIEW COMPARISON:  CT and radiographs earlier today. Radiographs 05/02/2017. FINDINGS: 1545 hours. Mild patient rotation to the right. Endotracheal tube has been placed, terminating at the level of the mid trachea.  There is a right IJ central venous catheter, the tip of which is not confidently identified due to overlap with the left subclavian pacemaker leads. The pacemaker leads appear unchanged. The heart size and mediastinal contours are stable allowing for patient rotation. There are persistent bilateral pleural effusions and bibasilar atelectasis. No evidence of pneumothorax. IMPRESSION: Satisfactory position of the endotracheal tube. Right IJ central venous catheter tip not well visualized. No evidence of pneumothorax or significant change in the pleural effusions. Electronically Signed   By: Richardean Sale M.D.   On: 05/15/2017 16:24   [place] Ekg Site Rite  Result Date: 05/05/2017 If Site Rite image not attached, placement could not be confirmed due to current cardiac rhythm.  [place] Ekg Site Rite  Result Date: 05/28/2017 If Site Rite image not attached, placement could not be confirmed due to current cardiac rhythm.    Medications:   . albumin human    . amiodarone 30 mg/hr (05/06/17 0600)  . lactated ringers 125 mL/hr at 05/06/17 1332  . pantoprozole (PROTONIX) infusion 8 mg/hr (05/06/17 0600)  . phenylephrine (NEO-SYNEPHRINE) Adult infusion 400 mcg/min (05/06/17 1249)  . piperacillin-tazobactam (ZOSYN)  IV Stopped (05/06/17 1244)  . vasopressin (PITRESSIN) infusion - *FOR SHOCK* 0.03 Units/min (05/06/17 0600)   . budesonide  0.5 mg Nebulization BID  . chlorhexidine gluconate (MEDLINE KIT)  15 mL Mouth Rinse BID  . divalproex  250 mg Oral BID  . feeding supplement (PRO-STAT SUGAR FREE 64)  30 mL Oral BID BM  . hydrocortisone sod succinate (SOLU-CORTEF) inj  25 mg Intravenous Q12H  . insulin aspart  0-9 Units Subcutaneous Q4H  . ipratropium-albuterol  3 mL Nebulization QID  . ketotifen  1 drop Both Eyes BID  . mouth rinse  15 mL Mouth Rinse QID  . nystatin   Topical Daily  . [START ON 05-16-2017] pantoprazole (PROTONIX) IV  40 mg Intravenous Q12H   acetaminophen **OR** acetaminophen, albuterol,  guaiFENesin-dextromethorphan, LORazepam, menthol-cetylpyridinium, ondansetron **OR** ondansetron (ZOFRAN) IV, polyethylene glycol, sodium chloride flush  Assessment/ Plan:  83 y.o. female with COPD 2 L O2 [place], Arthritis, h/o  breast cancer, colon cancer, diabetes, wheelchair/bed bound status, pulmonary hypertension, coronary atherosclerosis, dual chamber cardiac pacemaker (02/2014), OSA with hypercarbia  1. Hyponatemia 2. Multiorgan failure 3. Acute respiratory failure  Plan: Patient remains critically ill requiring multiple pressors and ventilatory support.  Urine output has improved.  Serum creatinine remains stable.  Agree with volume resuscitation and hemodynamic support.  Lactic acid level is slowly improving.  Continue supportive care.     LOS: [place] 5/3/20191:51 PM  [place], [place]  Note: This note was prepared with Dragon dictation. Any transcription errors are unintentional

## 2017-05-06 NOTE — Progress Notes (Signed)
Chaplain responded to page to meet with the patient's family for spiritual support. Chaplain offered prayer for the patient, and later the gathered family. Further, Chaplain provided presence and emotional support.

## 2017-05-06 NOTE — Progress Notes (Signed)
Pharmacy Electrolyte Monitoring Consult:  Pharmacy consulted to assist in monitoring and replacing electrolytes in this 83 y.o. female admitted on 05/31/2017 with Hemoptysis and Abdominal Pain  Patient currently requiring mechanical ventilation. Currently ordered LR at 126mL/hr. Patient previously on sodium bicarb infusion.   Labs:  Sodium (mmol/L)  Date Value  05/06/2017 136  09/29/2015 129 (A)  05/03/2014 139   Potassium (mmol/L)  Date Value  05/06/2017 2.8 (L)  05/03/2014 4.2   Magnesium (mg/dL)  Date Value  05/06/2017 1.7   Phosphorus (mg/dL)  Date Value  05/06/2017 2.8   Calcium (mg/dL)  Date Value  05/06/2017 7.0 (L)   Calcium, Total (mg/dL)  Date Value  05/03/2014 8.5 (L)   Albumin (g/dL)  Date Value  05/06/2017 2.5 (L)  05/22/2011 3.6    Plan:  Will order potassium chloride 45mEq IV x 1 and magnesium 2g IV X 1. Will adjust MIVF to LR/Potassium 75mEq/L at 161mL/hr.   Will recheck electroltyes with am labs.   Pharmacy will continue to monitor and adjust per consult.   Simpson,Michael L 05/06/2017 4:56 PM

## 2017-05-06 NOTE — Progress Notes (Signed)
[facility] NAME: Kathryn Hayes    MR#:  [id number]  DATE OF BIRTH:  April 29, 1934  SUBJECTIVE:  CHIEF COMPLAINT:   Chief Complaint  Patient presents with  . Hemoptysis  . Abdominal Pain   Critically sick, on vent, off sedatives but not responding ,patient had a CT of the head today.  Following CT head results were discussed with the family members and intensivist made the patient comfort care REVIEW OF SYSTEMS:  Review of Systems  Unable to perform ROS: Intubated    DRUG ALLERGIES:  No Known Allergies VITALS:  Blood pressure 118/66, pulse 96, temperature 97.7 F (36.5 C), temperature source Oral, resp. rate (!) 22, height 4\' 11"  (1.499 m), weight 112.9 kg (248 lb 14.4 oz), SpO2 92 %. PHYSICAL EXAMINATION:  Physical Exam  HENT:  Head: Normocephalic and atraumatic.  ETT in place  Eyes: Pupils are equal, round, and reactive to light. Conjunctivae and EOM are normal.  Neck: Normal range of motion. Neck supple. No tracheal deviation present. No thyromegaly present.  Cardiovascular: Normal rate, regular rhythm and normal heart sounds.  Pulmonary/Chest: Effort normal and breath sounds normal. No respiratory distress. She has no wheezes. She exhibits no tenderness.  Abdominal: Soft. Bowel sounds are normal. She exhibits no distension. There is no tenderness.  Musculoskeletal: Normal range of motion.  Neurological: No cranial nerve deficit.  Unable to assess - sedated on vent  Skin: Skin is warm and dry. No rash noted.  Psychiatric:  Unable to assess - sedated on vent   LABORATORY PANEL:  Female CBC Recent Labs  Lab 05/06/17 1414  WBC 44.7*  HGB 9.4*  HCT 28.8*  PLT 162   ------------------------------------------------------------------------------------------------------------------ Chemistries  Recent Labs  Lab 05/06/17 1414  NA 136  K 2.8*  CL 95*  CO2 29  GLUCOSE 108*  BUN 17  CREATININE 1.05*  CALCIUM 7.0*    MG 1.7  AST 1,707*  ALT 1,261*  ALKPHOS 70  BILITOT 1.5*   RADIOLOGY:  Ct Head Wo Contrast  Result Date: 05/06/2017 CLINICAL DATA:  Altered mental status. EXAM: CT HEAD WITHOUT CONTRAST TECHNIQUE: Contiguous axial images were obtained from the base of the skull through the vertex without intravenous contrast. COMPARISON:  CT scan of March 01, 2017. FINDINGS: Brain: Mild diffuse cortical atrophy is noted. Mild chronic ischemic white matter disease is noted. No mass effect or midline shift is noted. Ventricular size is within normal limits. There is no evidence of mass lesion, hemorrhage or acute infarction. Vascular: No hyperdense vessel or unexpected calcification. Skull: Normal. Negative for fracture or focal lesion. Sinuses/Orbits: Mild bilateral maxillary sinusitis is noted. Other: None. IMPRESSION: Mild diffuse cortical atrophy. Mild chronic ischemic white matter disease. No acute intracranial abnormality seen. Electronically Signed   By: Marijo Conception, M.D.   On: 05/06/2017 13:45   Dg Chest Port 1 View  Result Date: 05/06/2017 CLINICAL DATA:  Evaluation pneumonia. History of hypertension and breast cancer. History of diabetes, COPD, CHF. EXAM: PORTABLE CHEST 1 VIEW COMPARISON:  05/05/2017. FINDINGS: Endotracheal tube and NG tube in stable position. Right IJ line noted with tip at cavoatrial junction. Cardiac pacer with lead tips over the right atrium right ventricle. Cardiomegaly. Diffuse bilateral pulmonary infiltrates and bilateral pleural effusions consistent with CHF. Similar findings from prior exam. IMPRESSION: No active disease. Electronically Signed   By: Marcello Moores  Register   On: 05/06/2017 13:05   ASSESSMENT AND PLAN:   *Acute respiratory failure. -  on vent  * Acute metabolic encephalopathy  * Severe Sepsis POA. Etiology unclear. Leucocytosis, tachycardia, lactic acidosis present on admission -Patient treated with broad spectrum IV Abx. Vanc and zosyn, pressors  Neo-Synephrine, vasopressin IVF provided  *Hypokalemia hypomagnesemia  * gastric mass likely bezoar per GI - no EGD  * Mild hemoptysis  *Chronic diastolic CHF.    *Diabetes mellitus.    *Failure to thrive with no clinical improvement, very poor prognosis  DVT prophylaxis with SCDs   Intensivist has discussed with the family members and patient's CODE STATUS was changed to DNR with comfort care measures    All the records are reviewed and case discussed with Care Management/Social Worker. Management plans discussed with the patient, family and they are in agreement.  CODE STATUS: DNR, comfort care  TOTAL TIME TAKING CARE OF THIS PATIENT: 18 minutes.   More than 50% of the time was spent in counseling/coordination of care: YES  POSSIBLE D/C IN 5-6 DAYS, DEPENDING ON CLINICAL CONDITION.   Nicholes Mango M.D on 05/06/2017 at 7:17 PM  Between 7am to 6pm - Pager - [phone]  After 6pm go to [email or web address] - Proofreader  [facility]  Office  [phone]  CC: Primary care physician; Juluis Pitch, MD  Note: This dictation was prepared with Dragon dictation along with smaller phrase technology. Any transcriptional errors that result from this process are unintentional.

## 2017-05-06 NOTE — Progress Notes (Signed)
ABG attempted twice, unable to obtain on both attempts. Lovena Le, RN and Dr. Kandy Garrison notified.

## 2017-05-06 NOTE — Progress Notes (Signed)
MD Samaan called to bedside to meet with patients family. Family members expressed wanting to transition patient to comfort care. Orders to be placed and initiated.

## 2017-05-06 NOTE — Progress Notes (Signed)
Chaplain responded to OR and met with the patient's nurse. Apparently, the OR was triggered when new meds were requested for the patient. Chaplain checked in with the family and offered continuing emotional support. There has been little change in the patient.

## 2017-05-07 LAB — CALCIUM, IONIZED
CALCIUM, IONIZED, SERUM: 3.7 mg/dL — AB (ref 4.5–5.6)
Calcium, Ionized, Serum: 3.9 mg/dL — ABNORMAL LOW (ref 4.5–5.6)
Calcium, Ionized, Serum: 3.9 mg/dL — ABNORMAL LOW (ref 4.5–5.6)

## 2017-05-07 MED FILL — Medication: Qty: 1 | Status: AC

## 2017-05-09 LAB — CULTURE, BLOOD (ROUTINE X 2)
Culture: NO GROWTH
Special Requests: ADEQUATE

## 2017-05-09 LAB — GLUCOSE, CAPILLARY: GLUCOSE-CAPILLARY: 109 mg/dL — AB (ref 65–99)

## 2017-05-10 DIAGNOSIS — R400 Somnolence: Secondary | ICD-10-CM | POA: Insufficient documentation

## 2017-05-10 NOTE — Progress Notes (Signed)
Location:      Place of Service:  SNF (31) Provider:  Toni Arthurs, NP-C  Juluis Pitch, MD  Patient Care Team: Juluis Pitch, MD as PCP - General (Family Medicine) Alisa Graff, FNP as Nurse Practitioner (Family Medicine) Ubaldo Glassing Javier Docker, MD as Consulting Physician (Cardiology) Flora Lipps, MD as Consulting Physician (Pulmonary Disease)  Extended Emergency Contact Information Primary Emergency Contact: [name],[name] Address: [place], [place] [address] [name] of [place] Phone: [phone] Relation: Daughter Secondary Emergency Contact: Seba, Madole Mobile Phone: [phone] Relation: Son  Code Status:  FULL Goals of care: Advanced Directive information Advanced Directives 05/22/2017  Does Patient Have a Medical Advance Directive? Yes  Type of Paramedic of [place];Living will  Does patient want to make changes to medical advance directive? No - Patient declined  Copy of [place] in Chart? No - copy requested  Would patient like information on creating a medical advance directive? -     Chief Complaint  Patient presents with  . Acute Visit    HPI:  Pt is a 83 y.o. female seen today for an acute visit for cough, low sats. I was notified yesterday evening from night nurse, pt c/o shortness of breath with O2 sat 87% on 2L [place]. Scattered rhonchi, per nursing to BUL. Pt received little relief from DuoNebs. Afebrile. Order given for Solumedrol.IM. Improvement noted in O2 sats 4 hours later. (94% on 3 L) Nursing called with update early this morning. Pt rested the rest of the night. Non-productive cough noted. Order given to nursing for CXR and repeat solumedrol. On assessment, pt was somnolent, but arousable after several attempts. Pt having difficulty keeping eyes open. Lungs clear but diminished at the time of assessment (~9:30 am). Awaiting CXR results and lab results at this time. Pt did  c/o non-productive cough. She was having shallow respirations. No congestion, no secretions. Heart irregular, mildly tachy- pt has received several Duonebs throughout the night. Pt states she is feeling better, but is tired. Only able to speak in a whisper. Pt appears pale. Mucus membranes pale and somewhat dry. Will update Palliative Care NP.    Please note pt with limited verbal/cognitive ability. Unable to obtain complete ROS. Some ROS info obtained from staff and documentation.   Past Medical History:  Diagnosis Date  . Anxiety    unspecified  . Arthritis   . Breast cancer ([place]) 2007   Early stage left breast cancer status post partial colectomy, 04/2005 (Dr. Oliva Bustard)  . Chest pain, unspecified   . CHF (congestive heart failure) ([place])   . Chronic headache   . Colon cancer ([place])    status post partial colectomy, diagnosed 2000  . Colon neoplasm   . COPD (chronic obstructive pulmonary disease) ([place])   . Depression    unspecified  . Diabetes mellitus   . Fibrocystic breast   . History of breast cancer   . History of colon cancer   . HTN (hypertension) with goal to be determined   . Hyperlipidemia   . Hyperlipidemia, unspecified   . Hypertension   . Obesity, unspecified   . Sleep apnea    mild to moderate   Past Surgical History:  Procedure Laterality Date  . APPENDECTOMY    . BREAST LUMPECTOMY  2007   left  . BUNIONECTOMY Right   . CATARACT EXTRACTION W/ INTRAOCULAR LENS IMPLANT & ANTERIOR VITRECTOMY, BILATERAL Bilateral   .  COLON SURGERY  1993  . COLOSTOMY    . DILATION AND CURETTAGE, DIAGNOSTIC / THERAPEUTIC     Fibroid removal  . INSERT REPLACE REMOVE PACEMAKER     ppm medtronic [id number] Adapta dula chamber rate responsive. 02/21/14  . MASTECTOMY PARTIAL / LUMPECTOMY    . PARTIAL COLECTOMY     Abdominal & Transanal   . TONSILLECTOMY    . TOTAL KNEE ARTHROPLASTY Right 07/03/2013   Right total knee arthroplasty using computer assisted navigation  . TUBAL LIGATION  Bilateral 1971   with questionable appendectomy    No Known Allergies  Allergies as of 05/02/2017   No Known Allergies     Medication List        Accurate as of 05/02/17 11:59 PM. Always use your most recent med list.          acetaminophen 325 MG tablet Commonly known as:  TYLENOL Take 650 mg by mouth every 4 (four) hours as needed for mild pain, moderate pain or fever. Do not exceed 3000 mg in 24hrs   albuterol (2.5 MG/3ML) 0.083% nebulizer solution Commonly known as:  PROVENTIL Take 2.5 mg by nebulization every 6 (six) hours as needed for wheezing or shortness of breath.   aspirin EC 81 MG tablet Take 81 mg by mouth daily.   azithromycin 250 MG tablet Commonly known as:  ZITHROMAX Take 1 tablet (250 mg total) by mouth daily.   bisacodyl 5 MG EC tablet Commonly known as:  DULCOLAX Take 2 tablets (10 mg total) by mouth daily as needed for moderate constipation.   budesonide 0.5 MG/2ML nebulizer solution Commonly known as:  PULMICORT Take 0.5 mg by nebulization 2 (two) times daily.   Cholecalciferol 2000 units Caps Take 2,000 Units by mouth daily.   clonazePAM 0.5 MG tablet Commonly known as:  KLONOPIN Give 1/2 tablet (0.25 mg) in the AM by mouth  and 1 whole tablet (0.5 mg) in the PM by mouth (Insurance doesn't cover Lorazepam)   COMBIVENT RESPIMAT 20-100 MCG/ACT Aers respimat Generic drug:  Ipratropium-Albuterol Inhale 1 puff into the lungs 4 (four) times daily. RINSE MOUTH AFTER USE....Marland KitchenFOR PNEUMONIA AND BRONCHITIS   divalproex 125 MG capsule Commonly known as:  DEPAKOTE SPRINKLE Take 250 mg by mouth 2 (two) times daily.   DULoxetine 60 MG capsule Commonly known as:  CYMBALTA Take 60 mg by mouth daily.   eucerin cream Apply liberal amount topically to dry areas of skin daily after bath and as needed. Ok to keep at bedside   feeding supplement (ENSURE ENLIVE) Liqd Take 237 mLs by mouth 2 (two) times daily between meals.   feeding supplement (PRO-STAT  SUGAR FREE 64) Liqd Take 30 mLs by mouth 2 (two) times daily between meals.   fluticasone-salmeterol 115-21 MCG/ACT inhaler Commonly known as:  ADVAIR HFA Inhale 2 puffs into the lungs 2 (two) times daily. 8 am and 8 pm   furosemide 20 MG tablet Commonly known as:  LASIX Take 20 mg by mouth daily.   gabapentin 400 MG capsule Commonly known as:  NEURONTIN Take 400 mg by mouth 3 (three) times daily.   guaiFENesin 600 MG 12 hr tablet Commonly known as:  MUCINEX Take 1 tablet (600 mg total) by mouth 2 (two) times daily.   guaiFENesin-dextromethorphan 100-10 MG/5ML syrup Commonly known as:  ROBITUSSIN DM Take 5 mLs by mouth every 4 (four) hours as needed for cough.   ibuprofen 600 MG tablet Commonly known as:  ADVIL,MOTRIN Take 600 mg by mouth  every 6 (six) hours as needed for headache.   insulin aspart 100 UNIT/ML injection Commonly known as:  novoLOG 3 (three) times daily with meals. Per Sliding Scale; If Blood Sugar is 176 to 250, give 3 Units.If Blood Sugar is 251 to 350, give 6 Units.If Blood Sugar is 351 to 450, give 10 Units.  If BS >450, give 12 units and recheck in 1 hour. If still >450, call MD/NP.   ketotifen 0.025 % ophthalmic solution Commonly known as:  ZADITOR Place 1 drop into both eyes 2 (two) times daily.   magnesium oxide 400 MG tablet Commonly known as:  MAG-OX Take 400 mg by mouth 2 (two) times daily.   Melatonin 10 MG Tabs Take 10 mg by mouth at bedtime.   Menthol 1 MG Lozg Use as directed 1 lozenge in the mouth or throat every 6 (six) hours as needed. For irritation/pain   metoCLOPramide 5 MG tablet Commonly known as:  REGLAN Take 5 mg by mouth 4 (four) times daily -  before meals and at bedtime.   metoprolol tartrate 25 MG tablet Commonly known as:  LOPRESSOR Take 12.5 mg by mouth 2 (two) times daily.   nystatin powder Commonly known as:  MYCOSTATIN/NYSTOP Apply 1 application topically to abdominal folds daily for redness and irritation.     pantoprazole 20 MG tablet Commonly known as:  PROTONIX Take 20 mg by mouth daily.   potassium chloride 10 MEQ tablet Commonly known as:  K-DUR,KLOR-CON Take 10 mEq by mouth every other day.   pravastatin 10 MG tablet Commonly known as:  PRAVACHOL Take 10 mg by mouth daily at 6 PM. On Sunday   SENIOR TABS Tabs Take 1 tablet by mouth daily.   senna-docusate 8.6-50 MG tablet Commonly known as:  Senokot-S Take 1 tablet by mouth 2 (two) times daily.   sodium chloride 1 g tablet Take 1 tablet (1 g total) by mouth 2 (two) times daily with a meal.   sucralfate 1 g tablet Commonly known as:  CARAFATE Take 1 tablet by mouth 4 (four) times daily.   trandolapril 2 MG tablet Commonly known as:  MAVIK Take 2 mg by mouth daily.   VICKS VAPORUB 4.7-1.2-2.6 % Oint Apply liberal amount topically to fingernails at bedtime for Onychomycosis       Review of Systems  Unable to perform ROS: Mental status change  Constitutional: Positive for activity change, appetite change and fatigue. Negative for chills, diaphoresis and fever.  HENT: Negative for congestion, mouth sores, nosebleeds, postnasal drip, sneezing, sore throat, trouble swallowing and voice change.   Respiratory: Positive for cough and shortness of breath. Negative for apnea, choking, chest tightness and wheezing.   Cardiovascular: Negative for chest pain, palpitations and leg swelling.  Gastrointestinal: Negative for abdominal distention, abdominal pain, constipation, diarrhea and nausea.  Genitourinary: Negative for difficulty urinating, dysuria, frequency and urgency.  Musculoskeletal: Negative for back pain, gait problem and myalgias. Arthralgias: typical arthritis.  Skin: Positive for pallor. Negative for color change, rash and wound.  Neurological: Positive for weakness. Negative for dizziness, tremors, syncope, speech difficulty, numbness and headaches.  Psychiatric/Behavioral: Negative for agitation and behavioral  problems.  All other systems reviewed and are negative.   Immunization History  Administered Date(s) Administered  . Influenza Split 11/08/2013, 11/21/2014  . Influenza, High Dose Seasonal PF 12/05/2012  . Influenza-Unspecified 08/05/2015, 09/22/2016  . PPD Test 03/05/2014, 10/09/2015, 02/10/2016  . Pneumococcal Polysaccharide-23 05/16/2010  . Pneumococcal-Unspecified 03/05/2014   There are no preventive care  reminders to display for this patient. Fall Risk  04/05/2016 12/15/2015 10/07/2015 09/09/2015  Falls in the past year? No No Yes Yes  Number falls in past yr: - - 2 or more 2 or more  Injury with Fall? - - No No  Risk Factor Category  - - - High Fall Risk  Risk for fall due to : - - - History of fall(s)  Follow up - - Education provided;Falls evaluation completed Education provided   Functional Status Survey:    Vitals:   05/02/17 1117  BP: 105/73  Pulse: 92  Resp: 19  Temp: 98.5 F (36.9 C)  SpO2: 96%  Weight: 205 lb 6.4 oz (93.2 kg)   Body mass index is 35.26 kg/m. Physical Exam  Constitutional: She is oriented to person, place, and time. Vital signs are normal. She appears well-developed and well-nourished. She appears lethargic. She is sleeping and cooperative. She appears ill. No distress. Nasal cannula in place.  HENT:  Head: Normocephalic and atraumatic.  Mouth/Throat: Uvula is midline, oropharynx is clear and moist and mucous membranes are normal. Mucous membranes are not pale, not dry and not cyanotic.  Eyes: Pupils are equal, round, and reactive to light. Conjunctivae, EOM and lids are normal.  Neck: Trachea normal, normal range of motion and full passive range of motion without pain. Neck supple. No JVD present. No tracheal deviation, no edema and no erythema present. No thyromegaly present.  Cardiovascular: Normal heart sounds, intact distal pulses and normal pulses. An irregular rhythm present. Tachycardia present. Exam reveals no gallop, no distant heart  sounds and no friction rub.  No murmur heard. Pulses:      Dorsalis pedis pulses are 2+ on the right side, and 2+ on the left side.  No edema  Pulmonary/Chest: Effort normal. No accessory muscle usage. No respiratory distress. She has decreased breath sounds (shallow respirations) in the right lower field and the left lower field. She has no wheezes. She has no rhonchi. She has no rales. She exhibits no tenderness.  Abdominal: Soft. Normal appearance and bowel sounds are normal. She exhibits no distension and no ascites. There is no tenderness.  Musculoskeletal: Normal range of motion. She exhibits no edema or tenderness.  Expected osteoarthritis, stiffness; Bilateral Calves soft, supple. Negative Homan's Sign. B- pedal pulses equal; generalized weakness  Neurological: She is oriented to person, place, and time. She has normal strength. She appears lethargic. She exhibits abnormal muscle tone. Coordination and gait abnormal.  Somnolent- family reports pt was A&O, conversant, sitting up 1 1/2 hours later  Skin: Skin is warm, dry and intact. She is not diaphoretic. No cyanosis. There is pallor. Nails show no clubbing.  Psychiatric: She has a normal mood and affect. Her speech is normal and behavior is normal. Judgment and thought content normal. Cognition and memory are normal.  Nursing note and vitals reviewed.   Labs reviewed: Recent Labs    05/05/17 2116 05/06/17 0455 05/06/17 1414  NA 136 135 136  K 3.7 3.3* 2.8*  CL 98* 94* 95*  CO2 26 29 29   GLUCOSE 210* 162* 108*  BUN 13 15 17   CREATININE 1.06* 1.09* 1.05*  CALCIUM 6.8* 7.0* 7.0*  MG 1.8 1.8 1.7  PHOS 3.3 3.0 2.8   Recent Labs    05/10/2017 1728 05/05/17 0121 05/06/17 1414  AST 4,879* 6,630* 1,707*  ALT 1,958* 2,380* 1,261*  ALKPHOS 85 96 70  BILITOT 0.6 0.9 1.5*  PROT 3.4* 3.8* 3.8*  ALBUMIN 1.3* 1.5* 2.5*  Recent Labs    05/02/17 1832 05/24/2017 0641  05/06/17 0455 05/06/17 1027 05/06/17 1414  WBC 12.6* 18.4*   --   --   --  44.7*  NEUTROABS 11.3* 14.7*  --   --   --  43.3*  HGB 12.1 10.6*   < > 8.6* 9.7* 9.4*  HCT 36.5 31.8*   < > 26.1* 29.3* 28.8*  MCV 89.8 91.2  --   --   --  89.7  PLT 334 353  --   --   --  162   < > = values in this interval not displayed.   Lab Results  Component Value Date   TSH 2.617 03/08/2017   Lab Results  Component Value Date   HGBA1C 5.8 (H) 05/16/2017   Lab Results  Component Value Date   CHOL 139 03/08/2017   HDL 62 03/08/2017   LDLCALC 57 03/08/2017   TRIG 99 03/08/2017   CHOLHDL 2.2 03/08/2017    Significant Diagnostic Results in last 30 days:  Dg Chest 2 View  Result Date: 05/02/2017 CLINICAL DATA:  Pt arrived via ems - she denies any pain or shortness of breath - the family went to visit her at the nursing home and they stated that she was breathing to slow and they wanted her to be evaluated EXAM: CHEST - 2 VIEW COMPARISON:  02/27/2017 FINDINGS: Progressive small pleural effusions with adjacent consolidation/atelectasis in the lower lobes left greater than right. Stable cardiomegaly. Aortic Atherosclerosis (ICD10-170.0) Stable left subclavian transvenous pacemaker. Surgical clips in the left axilla. No pneumothorax. Visualized bones unremarkable. IMPRESSION: 1. Increase in bibasilar atelectasis/consolidation with small effusions, left greater than right. Electronically Signed   By: Lucrezia Europe M.D.   On: 05/02/2017 19:02   Dg Abd 1 View  Result Date: 05/14/2017 CLINICAL DATA:  Nasogastric tube placement and intubation. History of breast cancer, colon cancer, congestive heart failure and diabetes. EXAM: ABDOMEN - 1 VIEW COMPARISON:  CT earlier the same date.  Radiographs 02/16/2017. FINDINGS: 1545 hours. Nasogastric tube projects to the distal stomach. There is prominent stool throughout the colon which is mildly dilated diffusely. Probable rectal prolapse and gastric bezoar as correlated with previous CT. Vascular calcifications and mild spondylosis noted.  IMPRESSION: Nasogastric tube extends into the distal stomach. Prominent stool throughout the colon, similar to previous CT. Electronically Signed   By: Richardean Sale M.D.   On: 05/30/2017 16:29   Ct Head Wo Contrast  Result Date: 05/06/2017 CLINICAL DATA:  Altered mental status. EXAM: CT HEAD WITHOUT CONTRAST TECHNIQUE: Contiguous axial images were obtained from the base of the skull through the vertex without intravenous contrast. COMPARISON:  CT scan of March 01, 2017. FINDINGS: Brain: Mild diffuse cortical atrophy is noted. Mild chronic ischemic white matter disease is noted. No mass effect or midline shift is noted. Ventricular size is within normal limits. There is no evidence of mass lesion, hemorrhage or acute infarction. Vascular: No hyperdense vessel or unexpected calcification. Skull: Normal. Negative for fracture or focal lesion. Sinuses/Orbits: Mild bilateral maxillary sinusitis is noted. Other: None. IMPRESSION: Mild diffuse cortical atrophy. Mild chronic ischemic white matter disease. No acute intracranial abnormality seen. Electronically Signed   By: Marijo Conception, M.D.   On: 05/06/2017 13:45   Ct Chest W Contrast  Result Date: 05/21/2017 CLINICAL DATA:  Hemoptysis versus hematemesis. Abdominal pain, cough. EXAM: CT CHEST, ABDOMEN, AND PELVIS WITH CONTRAST TECHNIQUE: Multidetector CT imaging of the chest, abdomen and pelvis was performed following the standard  protocol during bolus administration of intravenous contrast. CONTRAST:  34m ISOVUE-370 IOPAMIDOL (ISOVUE-370) INJECTION 76% COMPARISON:  Chest CT 03/02/2017.  Abdominal CT 05/17/2007. FINDINGS: CT CHEST FINDINGS Cardiovascular: Moderate coronary artery calcifications in the aortic arch and descending thoracic aorta. Moderate scattered coronary artery calcifications. Pacer wires noted in the right atrium and right ventricle. Heart is normal size. Aorta is normal caliber. No evidence of aortic dissection. Mediastinum/Nodes: No  mediastinal, hilar, or axillary adenopathy. The esophagus is fluid-filled. Lungs/Pleura: Small bilateral pleural effusions. Compressive atelectasis in the lower lobes. Musculoskeletal: No acute bony abnormality. CT ABDOMEN PELVIS FINDINGS Hepatobiliary: Fatty infiltration of the liver. No focal abnormality or biliary ductal dilatation. Gallbladder unremarkable. Pancreas: No focal abnormality or ductal dilatation. Mildly atrophic. Spleen: No focal abnormality.  Normal size. Adrenals/Urinary Tract: Areas of cortical thinning bilaterally. No hydronephrosis. Cysts noted in the mid to lower pole of the left kidney. No adrenal mass or visible urinary bladder abnormality. Stomach/Bowel: Large stool burden throughout the colon. Scattered sigmoid diverticula. Large masslike filling defect noted in the stomach which appears to be separate from the gastric wall. This contains gas within this large intraluminal mass. Fluid and gas surrounding mass. I favor this represents a bezoar. No visible gastric wall abnormality or gastric outlet obstruction. Small bowel is decompressed. Vascular/Lymphatic: Heavily calcified aorta and branch vessels. No aneurysm or adenopathy. Reproductive: Probable anterior uterine fibroid, best seen on sagittal imaging, measuring up to 3.2 cm. No adnexal mass. Other: No free fluid or free air. Musculoskeletal: No acute bony abnormality. IMPRESSION: Small bilateral pleural effusions with compressive atelectasis in the lower lobes. Coronary artery disease, aortic atherosclerosis. Large intraluminal filling defect within the stomach, question bezoar. No visible gastric wall abnormality. This could be further evaluated with direct visualization/endoscopy. Esophagus is fluid-filled possibly related to reflux or dysmotility. Large stool burden throughout the colon. Fatty infiltration of the liver. Aortoiliac atherosclerosis. Electronically Signed   By: KRolm BaptiseM.D.   On: 05/28/2017 09:52   Ct Abdomen  Pelvis W Contrast  Result Date: 05/23/2017 CLINICAL DATA:  Hemoptysis versus hematemesis. Abdominal pain, cough. EXAM: CT CHEST, ABDOMEN, AND PELVIS WITH CONTRAST TECHNIQUE: Multidetector CT imaging of the chest, abdomen and pelvis was performed following the standard protocol during bolus administration of intravenous contrast. CONTRAST:  769mISOVUE-370 IOPAMIDOL (ISOVUE-370) INJECTION 76% COMPARISON:  Chest CT 03/02/2017.  Abdominal CT 05/17/2007. FINDINGS: CT CHEST FINDINGS Cardiovascular: Moderate coronary artery calcifications in the aortic arch and descending thoracic aorta. Moderate scattered coronary artery calcifications. Pacer wires noted in the right atrium and right ventricle. Heart is normal size. Aorta is normal caliber. No evidence of aortic dissection. Mediastinum/Nodes: No mediastinal, hilar, or axillary adenopathy. The esophagus is fluid-filled. Lungs/Pleura: Small bilateral pleural effusions. Compressive atelectasis in the lower lobes. Musculoskeletal: No acute bony abnormality. CT ABDOMEN PELVIS FINDINGS Hepatobiliary: Fatty infiltration of the liver. No focal abnormality or biliary ductal dilatation. Gallbladder unremarkable. Pancreas: No focal abnormality or ductal dilatation. Mildly atrophic. Spleen: No focal abnormality.  Normal size. Adrenals/Urinary Tract: Areas of cortical thinning bilaterally. No hydronephrosis. Cysts noted in the mid to lower pole of the left kidney. No adrenal mass or visible urinary bladder abnormality. Stomach/Bowel: Large stool burden throughout the colon. Scattered sigmoid diverticula. Large masslike filling defect noted in the stomach which appears to be separate from the gastric wall. This contains gas within this large intraluminal mass. Fluid and gas surrounding mass. I favor this represents a bezoar. No visible gastric wall abnormality or gastric outlet obstruction. Small bowel is decompressed. Vascular/Lymphatic:  Heavily calcified aorta and branch vessels.  No aneurysm or adenopathy. Reproductive: Probable anterior uterine fibroid, best seen on sagittal imaging, measuring up to 3.2 cm. No adnexal mass. Other: No free fluid or free air. Musculoskeletal: No acute bony abnormality. IMPRESSION: Small bilateral pleural effusions with compressive atelectasis in the lower lobes. Coronary artery disease, aortic atherosclerosis. Large intraluminal filling defect within the stomach, question bezoar. No visible gastric wall abnormality. This could be further evaluated with direct visualization/endoscopy. Esophagus is fluid-filled possibly related to reflux or dysmotility. Large stool burden throughout the colon. Fatty infiltration of the liver. Aortoiliac atherosclerosis. Electronically Signed   By: Rolm Baptise M.D.   On: 05/23/2017 09:52   Dg Op Swallowing Func-medicare/speech Path  Result Date: 04/22/2017 CLINICAL DATA:  Silent aspiration. EXAM: MODIFIED BARIUM SWALLOW TECHNIQUE: Different consistencies of barium were administered orally to the patient by the Speech Pathologist. Imaging of the pharynx was performed in the lateral projection. FLUOROSCOPY TIME:  Fluoroscopy Time:  2 minutes 0 seconds Radiation Exposure Index (if provided by the fluoroscopic device): 44.7 mGy Number of Acquired Spot Images: 1 COMPARISON:  CT 03/02/2017. FINDINGS: Thin liquid- within normal limits Pure- within normal limits Pure with cracker- within normal limits IMPRESSION: Normal exam. Please refer to the Speech Pathologists report for complete details and recommendations. Electronically Signed   By: Marcello Moores  Register   On: 04/22/2017 13:02   Dg Chest Port 1 View  Result Date: 05/06/2017 CLINICAL DATA:  Evaluation pneumonia. History of hypertension and breast cancer. History of diabetes, COPD, CHF. EXAM: PORTABLE CHEST 1 VIEW COMPARISON:  05/05/2017. FINDINGS: Endotracheal tube and NG tube in stable position. Right IJ line noted with tip at cavoatrial junction. Cardiac pacer with lead tips  over the right atrium right ventricle. Cardiomegaly. Diffuse bilateral pulmonary infiltrates and bilateral pleural effusions consistent with CHF. Similar findings from prior exam. IMPRESSION: No active disease. Electronically Signed   By: Marcello Moores  Register   On: 05/06/2017 13:05   Dg Chest Port 1 View  Result Date: 05/05/2017 CLINICAL DATA:  CHF EXAM: PORTABLE CHEST 1 VIEW COMPARISON:  05/30/2017 FINDINGS: Support devices are stable. Cardiomegaly with vascular congestion, layering effusions and bilateral airspace opacities. Layering effusions appear more prominent which may be positional. IMPRESSION: Continued CHF with layering bilateral effusions. The effusions appear larger. However, this may be positional. Electronically Signed   By: Rolm Baptise M.D.   On: 05/05/2017 09:48   Dg Chest Port 1 View  Result Date: 05/19/2017 CLINICAL DATA:  Nasogastric tube placement and intubation. History of breast cancer, colon cancer, congestive heart failure and diabetes. EXAM: PORTABLE CHEST 1 VIEW COMPARISON:  CT and radiographs earlier today. Radiographs 05/02/2017. FINDINGS: 1545 hours. Mild patient rotation to the right. Endotracheal tube has been placed, terminating at the level of the mid trachea. There is a right IJ central venous catheter, the tip of which is not confidently identified due to overlap with the left subclavian pacemaker leads. The pacemaker leads appear unchanged. The heart size and mediastinal contours are stable allowing for patient rotation. There are persistent bilateral pleural effusions and bibasilar atelectasis. No evidence of pneumothorax. IMPRESSION: Satisfactory position of the endotracheal tube. Right IJ central venous catheter tip not well visualized. No evidence of pneumothorax or significant change in the pleural effusions. Electronically Signed   By: Richardean Sale M.D.   On: 05/17/2017 16:24   Dg Chest Portable 1 View  Result Date: 05/31/2017 CLINICAL DATA:  Abdominal pain, cough,  and bloody sputum. EXAM: PORTABLE CHEST 1 VIEW  COMPARISON:  05/02/2017 FINDINGS: Cardiac pacemaker. Shallow inspiration with atelectasis in the lung bases. Cardiac enlargement. No vascular congestion, edema, or consolidation. Hazy appearance of the left costophrenic angle may represent small effusion. Calcified and tortuous aorta. No pneumothorax. IMPRESSION: Shallow inspiration with atelectasis in the lung bases. Possible left pleural effusion. Cardiac enlargement. No edema or consolidation. Aortic atherosclerosis. Electronically Signed   By: Lucienne Capers M.D.   On: 05/11/2017 06:45   [place] Ekg Site Rite  Result Date: 05/13/2017 If Site Rite image not attached, placement could not be confirmed due to current cardiac rhythm.  [place] Ekg Site Rite  Result Date: 05/27/2017 If Site Rite image not attached, placement could not be confirmed due to current cardiac rhythm.   Assessment/Plan Alexxa was seen today for acute visit.  Diagnoses and all orders for this visit:  Chronic obstructive pulmonary disease with acute exacerbation (HCC)  Somnolence    Solumedrol 125 mg IM x 1 (given last night at 2230)  Repeat Solu-Medrol 125 mg IM x 1 this am  Labs  CXR (Negative for Acute Processes)   Discussed with Palliative Care NP  Discussion with Daughter at ~1100 am  Pt to remain Full Code for now  Daughter called back at 77- requested pt be sent to the ED for evaluation  Family/ staff Communication:   Total Time: 35 minutes  Documentation:  Face to Face: 15 minutes  Family/Phone: discussion with daughter via telephone 20 minutes   Labs/tests ordered:  2-V CXR, cbc, met c  Medication list reviewed and assessed for continued appropriateness.  Vikki Ports, NP-C Geriatrics [facility] [phone], [place] [address] Cell Phone (Mon-Fri 8am-5pm):  [phone] On Call:  [phone] & follow prompts after 5pm & weekends Office  Phone:  [phone] Office Fax:  [phone]

## 2017-05-17 ENCOUNTER — Telehealth: Payer: Self-pay

## 2017-05-17 NOTE — Telephone Encounter (Signed)
Death certificate placed in MD folder for completion.

## 2017-05-17 NOTE — Telephone Encounter (Signed)
[facility] mailed death certificate to be completed and signed Placed in nurse box

## 2017-05-17 NOTE — Telephone Encounter (Signed)
Death certificate placed in outgoing mail.

## 2017-06-04 NOTE — Progress Notes (Signed)
Chaplain responded to page and returned to provide care to the family after the patient's death. Chaplain held space, offered prayer for the patient and family, and provided emotional support.

## 2017-06-04 NOTE — Progress Notes (Signed)
Pt became apneic and pulseless at 0050. Magnet applied and pacemaker disabled. Pt pronounced by this RN and Domingo Pulse Margaretann Loveless RN. Candis Schatz and Elink MD notified.

## 2017-06-04 NOTE — Discharge Summary (Signed)
Admission: on 05/24/2017  Admission diagnosis: -Sepsis -Bezoar -GI bleeding -CHF -Uncontrolled DM  Course of admission: Acute respiratory failure.Poor weaning parameters.  Altered mental status with hypoglycemia and metabolic encephalopathy.worsening, unresponsive and has been off sedation. -No acute abnormality on head CT  Septic shock  Metabolic acidosis  withLactic acidemiawith sepsis / mesenteric ischemia and tissue hypoxia  AKI with oligurea due to ATN and renal hypoperfusion  GI bleeding.   UTI  Uncontrolled DM after severe hypoglycemia.  A fib with RVR. H/o HFpEF, grade I LV diastolic dysfunction ECHO in 02/2017 LV EF 55-60% and Grade I LV diastolic dysfunction  Mottled feet with vasopressors with palpable PP.  On 05/06/3017 Family decided to proceed with comfort care and withdraw of support. Code status was DNR.

## 2017-06-04 DEATH — deceased

## 2018-06-08 IMAGING — DX DG CHEST 1V PORT
1 series · 2 of 2 positions shown · non-contrast
Comparison: 6105 chest radiograph.

CLINICAL DATA: Respiratory distress

EXAM:
PORTABLE CHEST 1 VIEW

[Series 1: chest ap · 0.14mm/px · 2 of 2 slices shown]
[im 1/2]
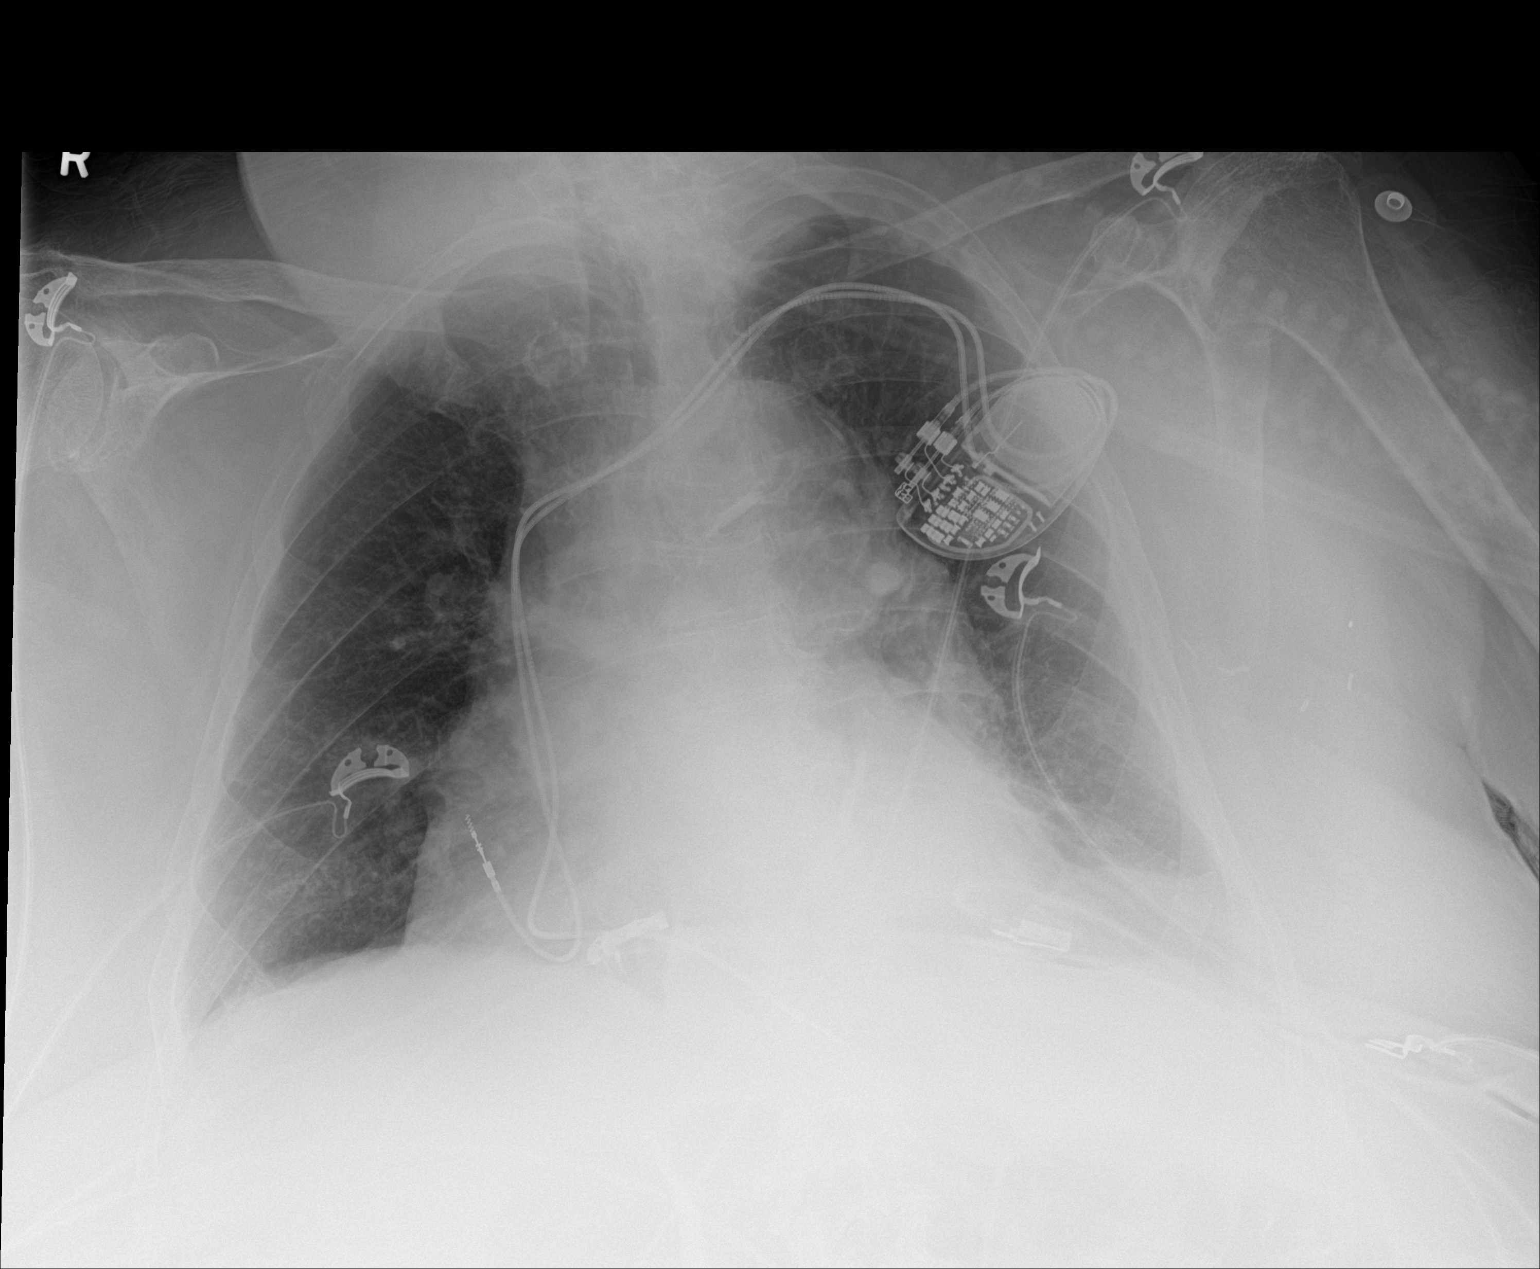
[im 2/2]
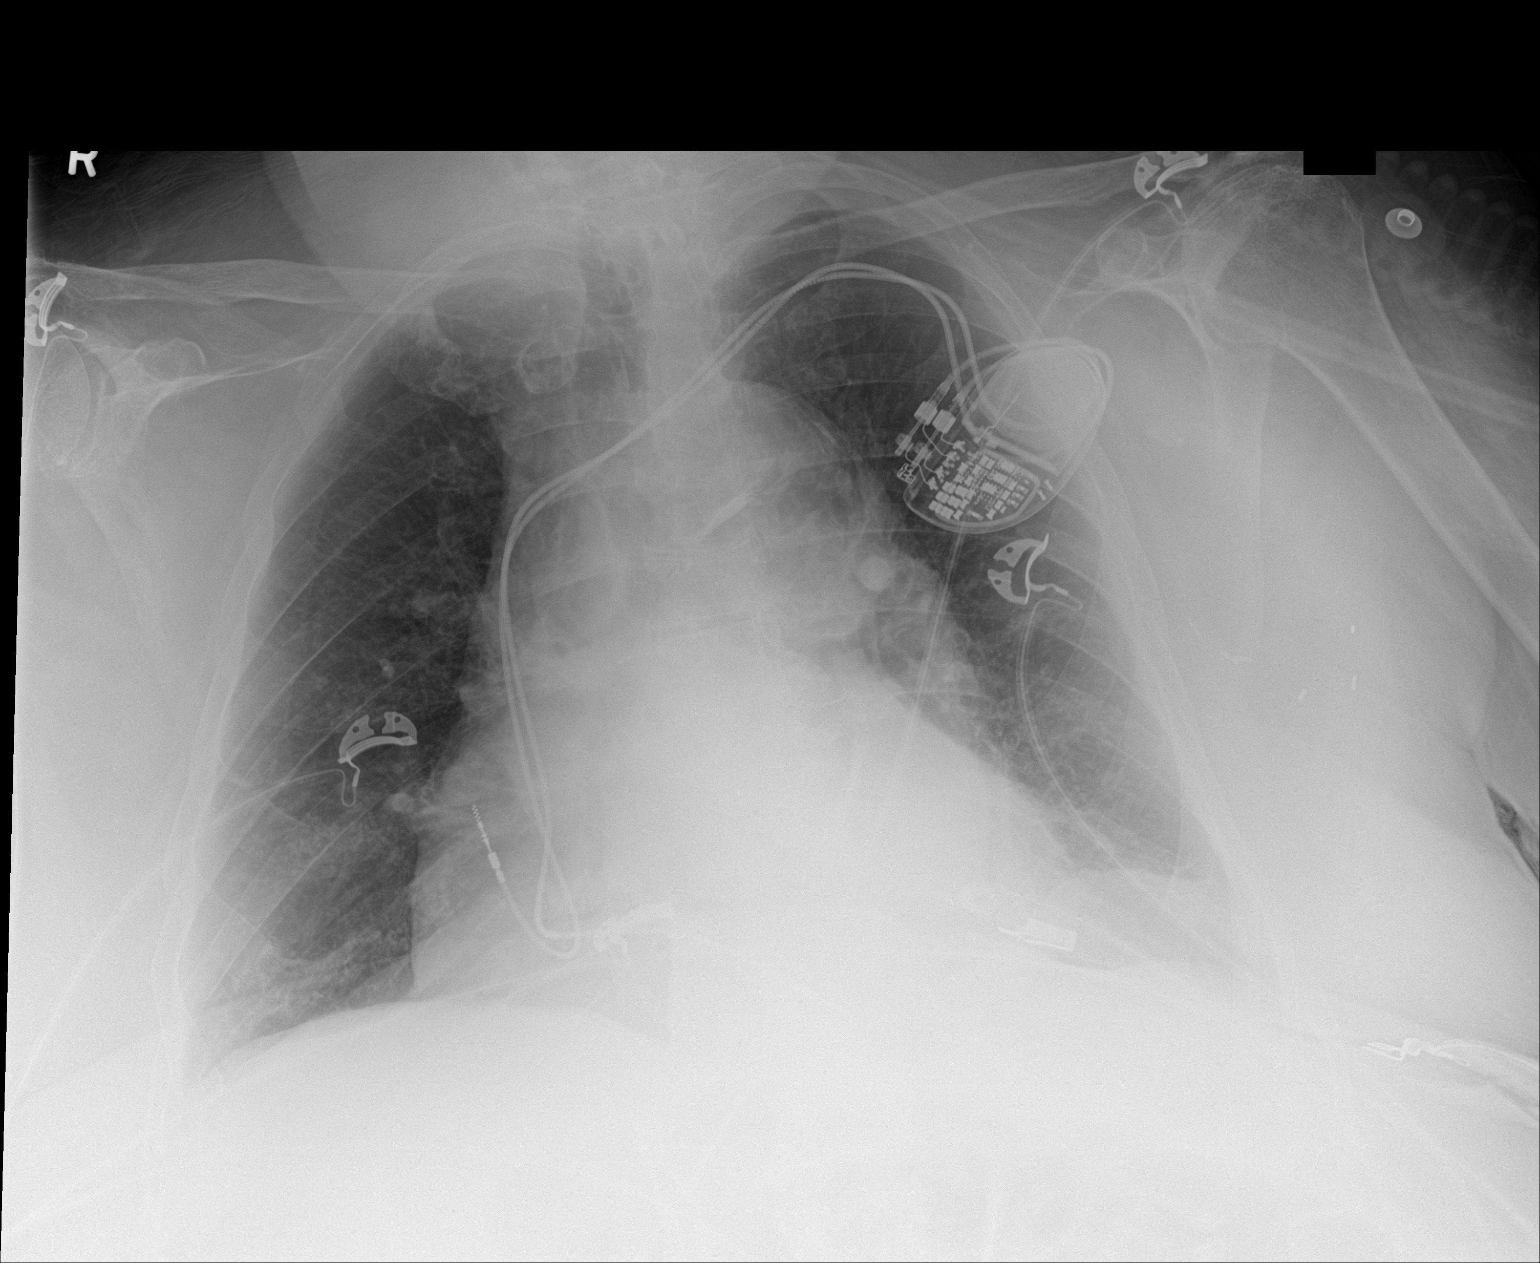

[2 of 2 positions shown; findings below may reference images not displayed]

FINDINGS: Right rotated chest radiographs. Stable configuration of 2 lead left
subclavian pacemaker. Stable cardiomediastinal silhouette with mild
cardiomegaly. No pneumothorax. No pleural effusion. No overt
pulmonary edema. Left retrocardiac atelectasis.
IMPRESSION: Stable mild cardiomegaly without overt pulmonary edema. Left
retrocardiac atelectasis.

## 2018-06-08 IMAGING — DX DG CHEST 1V PORT
1 series · 1 of 1 positions shown · non-contrast
Comparison: 02/14/2017, 09/22/2015

CLINICAL DATA: 82-year-old female with a history of respiratory
distress

EXAM:
PORTABLE CHEST 1 VIEW

[chest ap]
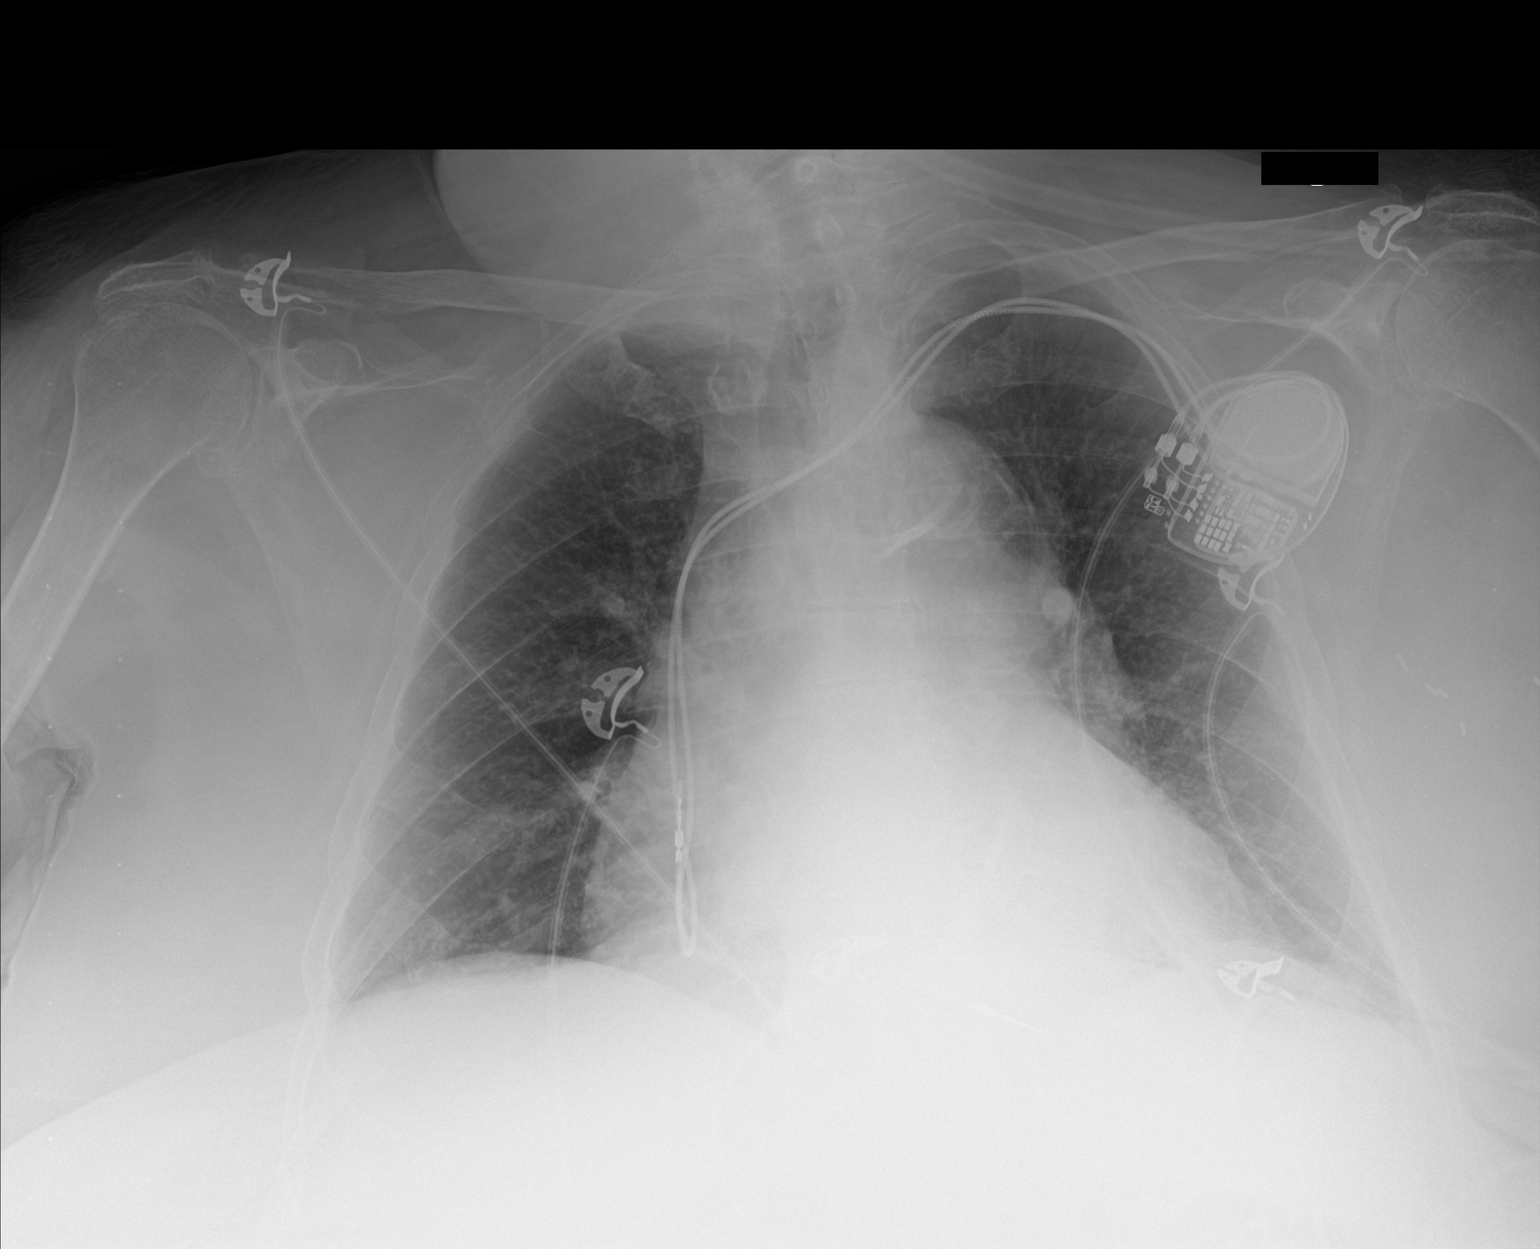

[1 of 1 positions shown; findings below may reference images not displayed]

FINDINGS: Cardiomediastinal silhouette unchanged with cardiomegaly.
Calcifications of the aortic arch.

No evidence of interlobular septal thickening. No confluent airspace
disease. No pneumothorax. No pleural effusion. Left chest wall
cardiac pacing device. Coarsened interstitial markings.
IMPRESSION: Chronic lung changes without evidence of superimposed acute
cardiopulmonary disease.

Cardiomegaly.

Unchanged cardiac pacing device.

## 2018-06-21 IMAGING — DX DG CHEST 1V PORT
1 series · 1 of 1 positions shown · non-contrast
Comparison: February 23, 2017

CLINICAL DATA: Shortness of breath and mental status change

EXAM:
PORTABLE CHEST 1 VIEW

[chest ap]
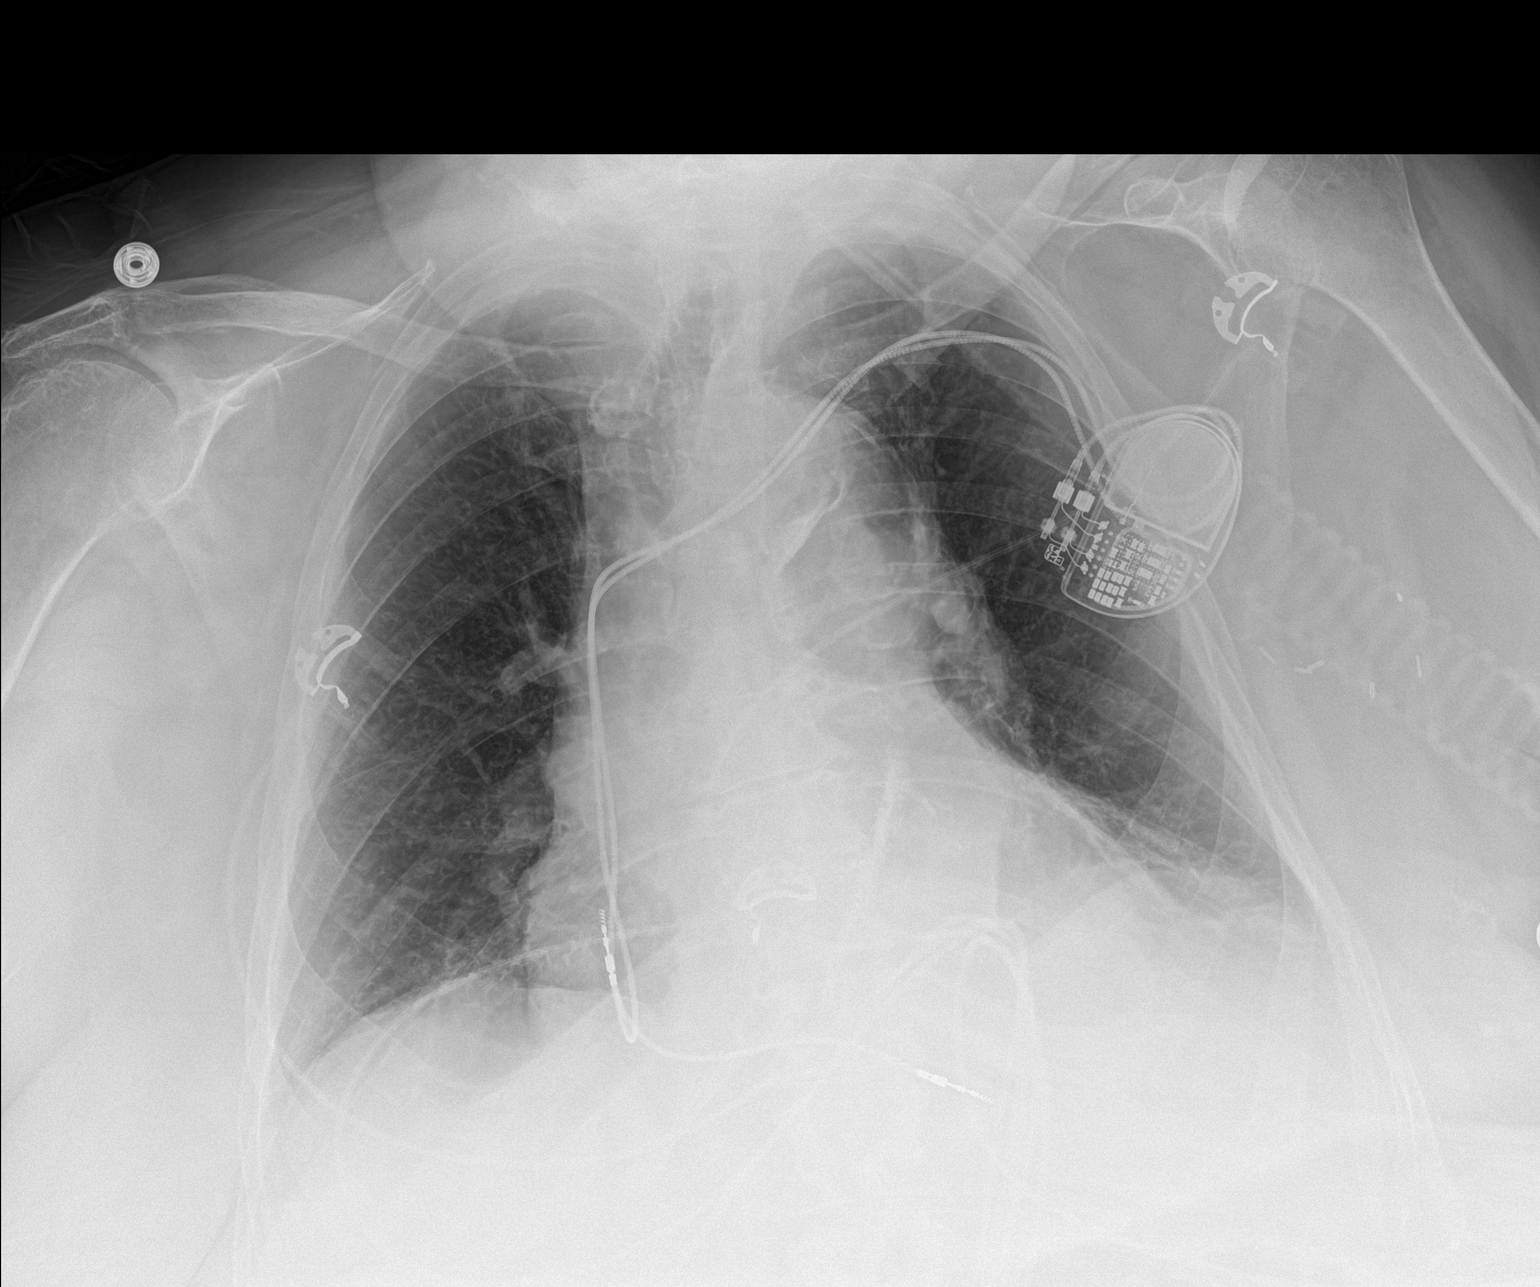

[1 of 1 positions shown; findings below may reference images not displayed]

FINDINGS: Stable pacemaker. The heart, hila, mediastinum, lungs, and pleura
are unchanged. Mild atelectasis in the left base.
IMPRESSION: No active disease.
